# Patient Record
Sex: Female | Born: 1945 | Race: White | Hispanic: No | Smoking: Never smoker
Health system: Southern US, Community
[De-identification: ages and names within clinical notes are randomized; demographics above are authoritative.]

## PROBLEM LIST (undated history)

## (undated) DIAGNOSIS — E119 Type 2 diabetes mellitus without complications: Secondary | ICD-10-CM

## (undated) DIAGNOSIS — N393 Stress incontinence (female) (male): Secondary | ICD-10-CM

## (undated) DIAGNOSIS — N816 Rectocele: Secondary | ICD-10-CM

## (undated) DIAGNOSIS — IMO0002 Reserved for concepts with insufficient information to code with codable children: Secondary | ICD-10-CM

## (undated) DIAGNOSIS — M75100 Unspecified rotator cuff tear or rupture of unspecified shoulder, not specified as traumatic: Secondary | ICD-10-CM

## (undated) DIAGNOSIS — I10 Essential (primary) hypertension: Secondary | ICD-10-CM

## (undated) DIAGNOSIS — D649 Anemia, unspecified: Secondary | ICD-10-CM

## (undated) DIAGNOSIS — L900 Lichen sclerosus et atrophicus: Secondary | ICD-10-CM

## (undated) DIAGNOSIS — M8580 Other specified disorders of bone density and structure, unspecified site: Secondary | ICD-10-CM

## (undated) DIAGNOSIS — I452 Bifascicular block: Secondary | ICD-10-CM

## (undated) DIAGNOSIS — M1990 Unspecified osteoarthritis, unspecified site: Secondary | ICD-10-CM

## (undated) DIAGNOSIS — E039 Hypothyroidism, unspecified: Secondary | ICD-10-CM

## (undated) DIAGNOSIS — N185 Chronic kidney disease, stage 5: Secondary | ICD-10-CM

## (undated) DIAGNOSIS — N189 Chronic kidney disease, unspecified: Secondary | ICD-10-CM

## (undated) DIAGNOSIS — K222 Esophageal obstruction: Secondary | ICD-10-CM

## (undated) DIAGNOSIS — I872 Venous insufficiency (chronic) (peripheral): Secondary | ICD-10-CM

## (undated) HISTORY — DX: Chronic kidney disease, unspecified: N18.9

## (undated) HISTORY — DX: Other specified disorders of bone density and structure, unspecified site: M85.80

## (undated) HISTORY — DX: Stress incontinence (female) (male): N39.3

## (undated) HISTORY — DX: Type 2 diabetes mellitus without complications: E11.9

## (undated) HISTORY — DX: Anemia, unspecified: D64.9

## (undated) HISTORY — DX: Hyperlipidemia, unspecified: E78.5

## (undated) HISTORY — DX: Unspecified rotator cuff tear or rupture of unspecified shoulder, not specified as traumatic: M75.100

## (undated) HISTORY — DX: Reserved for concepts with insufficient information to code with codable children: IMO0002

## (undated) HISTORY — PX: RECTAL SURGERY: SHX760

## (undated) HISTORY — DX: Esophageal obstruction: K22.2

## (undated) HISTORY — DX: Essential (primary) hypertension: I10

## (undated) HISTORY — DX: Rectocele: N81.6

## (undated) HISTORY — PX: INCONTINENCE SURGERY: SHX676

## (undated) HISTORY — DX: Unspecified osteoarthritis, unspecified site: M19.90

## (undated) HISTORY — DX: Venous insufficiency (chronic) (peripheral): I87.2

## (undated) HISTORY — DX: Bifascicular block: I45.2

## (undated) HISTORY — DX: Hypothyroidism, unspecified: E03.9

---

## 1985-01-19 HISTORY — PX: CHOLECYSTECTOMY: SHX55

## 1998-03-22 ENCOUNTER — Encounter: Payer: Self-pay | Admitting: Internal Medicine

## 1998-03-22 ENCOUNTER — Ambulatory Visit (HOSPITAL_COMMUNITY): Admission: RE | Admit: 1998-03-22 | Discharge: 1998-03-22 | Payer: Self-pay | Admitting: Internal Medicine

## 1999-02-03 ENCOUNTER — Other Ambulatory Visit: Admission: RE | Admit: 1999-02-03 | Discharge: 1999-02-03 | Payer: Self-pay | Admitting: Obstetrics and Gynecology

## 1999-05-05 ENCOUNTER — Encounter (HOSPITAL_BASED_OUTPATIENT_CLINIC_OR_DEPARTMENT_OTHER): Payer: Self-pay | Admitting: Internal Medicine

## 1999-05-05 ENCOUNTER — Encounter: Admission: RE | Admit: 1999-05-05 | Discharge: 1999-05-05 | Payer: Self-pay | Admitting: Internal Medicine

## 1999-08-05 ENCOUNTER — Ambulatory Visit (HOSPITAL_COMMUNITY): Admission: RE | Admit: 1999-08-05 | Discharge: 1999-08-05 | Payer: Self-pay | Admitting: *Deleted

## 1999-08-05 ENCOUNTER — Encounter: Payer: Self-pay | Admitting: *Deleted

## 2000-06-14 ENCOUNTER — Encounter: Admission: RE | Admit: 2000-06-14 | Discharge: 2000-06-14 | Payer: Self-pay | Admitting: Obstetrics and Gynecology

## 2000-06-14 ENCOUNTER — Encounter: Payer: Self-pay | Admitting: Obstetrics and Gynecology

## 2000-06-15 ENCOUNTER — Other Ambulatory Visit: Admission: RE | Admit: 2000-06-15 | Discharge: 2000-06-15 | Payer: Self-pay | Admitting: Obstetrics and Gynecology

## 2001-06-15 ENCOUNTER — Encounter: Payer: Self-pay | Admitting: Obstetrics and Gynecology

## 2001-06-15 ENCOUNTER — Encounter: Admission: RE | Admit: 2001-06-15 | Discharge: 2001-06-15 | Payer: Self-pay | Admitting: Obstetrics and Gynecology

## 2001-06-23 ENCOUNTER — Other Ambulatory Visit: Admission: RE | Admit: 2001-06-23 | Discharge: 2001-06-23 | Payer: Self-pay | Admitting: Obstetrics and Gynecology

## 2001-07-26 ENCOUNTER — Emergency Department (HOSPITAL_COMMUNITY): Admission: EM | Admit: 2001-07-26 | Discharge: 2001-07-26 | Payer: Self-pay | Admitting: Emergency Medicine

## 2002-02-08 ENCOUNTER — Encounter: Payer: Self-pay | Admitting: Emergency Medicine

## 2002-02-08 ENCOUNTER — Emergency Department (HOSPITAL_COMMUNITY): Admission: EM | Admit: 2002-02-08 | Discharge: 2002-02-08 | Payer: Self-pay | Admitting: Emergency Medicine

## 2002-03-30 ENCOUNTER — Encounter: Admission: RE | Admit: 2002-03-30 | Discharge: 2002-03-30 | Payer: Self-pay | Admitting: Obstetrics and Gynecology

## 2002-03-30 ENCOUNTER — Encounter: Payer: Self-pay | Admitting: Obstetrics and Gynecology

## 2002-04-18 ENCOUNTER — Encounter: Payer: Self-pay | Admitting: Obstetrics and Gynecology

## 2002-04-18 ENCOUNTER — Encounter: Admission: RE | Admit: 2002-04-18 | Discharge: 2002-04-18 | Payer: Self-pay | Admitting: Obstetrics and Gynecology

## 2002-04-24 ENCOUNTER — Encounter: Payer: Self-pay | Admitting: Internal Medicine

## 2002-04-24 ENCOUNTER — Encounter: Admission: RE | Admit: 2002-04-24 | Discharge: 2002-04-24 | Payer: Self-pay | Admitting: Internal Medicine

## 2002-05-19 ENCOUNTER — Emergency Department (HOSPITAL_COMMUNITY): Admission: EM | Admit: 2002-05-19 | Discharge: 2002-05-19 | Payer: Self-pay | Admitting: Emergency Medicine

## 2002-06-27 ENCOUNTER — Other Ambulatory Visit: Admission: RE | Admit: 2002-06-27 | Discharge: 2002-06-27 | Payer: Self-pay | Admitting: Obstetrics and Gynecology

## 2002-11-18 ENCOUNTER — Emergency Department (HOSPITAL_COMMUNITY): Admission: EM | Admit: 2002-11-18 | Discharge: 2002-11-19 | Payer: Self-pay | Admitting: Emergency Medicine

## 2003-04-26 ENCOUNTER — Encounter: Admission: RE | Admit: 2003-04-26 | Discharge: 2003-04-26 | Payer: Self-pay | Admitting: Obstetrics and Gynecology

## 2003-10-08 ENCOUNTER — Other Ambulatory Visit: Admission: RE | Admit: 2003-10-08 | Discharge: 2003-10-08 | Payer: Self-pay | Admitting: Obstetrics and Gynecology

## 2004-03-27 ENCOUNTER — Encounter: Admission: RE | Admit: 2004-03-27 | Discharge: 2004-03-27 | Payer: Self-pay | Admitting: Gastroenterology

## 2004-04-21 ENCOUNTER — Ambulatory Visit (HOSPITAL_COMMUNITY): Admission: RE | Admit: 2004-04-21 | Discharge: 2004-04-21 | Payer: Self-pay | Admitting: Gastroenterology

## 2004-05-08 ENCOUNTER — Encounter: Admission: RE | Admit: 2004-05-08 | Discharge: 2004-05-08 | Payer: Self-pay | Admitting: Internal Medicine

## 2004-05-22 ENCOUNTER — Encounter: Admission: RE | Admit: 2004-05-22 | Discharge: 2004-05-22 | Payer: Self-pay | Admitting: Internal Medicine

## 2004-11-17 ENCOUNTER — Other Ambulatory Visit: Admission: RE | Admit: 2004-11-17 | Discharge: 2004-11-17 | Payer: Self-pay | Admitting: Obstetrics and Gynecology

## 2005-06-08 ENCOUNTER — Encounter: Admission: RE | Admit: 2005-06-08 | Discharge: 2005-06-08 | Payer: Self-pay | Admitting: Obstetrics and Gynecology

## 2006-06-10 ENCOUNTER — Encounter: Admission: RE | Admit: 2006-06-10 | Discharge: 2006-06-10 | Payer: Self-pay | Admitting: Obstetrics and Gynecology

## 2006-10-28 ENCOUNTER — Ambulatory Visit (HOSPITAL_BASED_OUTPATIENT_CLINIC_OR_DEPARTMENT_OTHER): Admission: RE | Admit: 2006-10-28 | Discharge: 2006-10-28 | Payer: Self-pay | Admitting: Urology

## 2007-01-18 ENCOUNTER — Ambulatory Visit (HOSPITAL_BASED_OUTPATIENT_CLINIC_OR_DEPARTMENT_OTHER): Admission: RE | Admit: 2007-01-18 | Discharge: 2007-01-18 | Payer: Self-pay | Admitting: Urology

## 2007-04-07 ENCOUNTER — Ambulatory Visit (HOSPITAL_COMMUNITY): Admission: RE | Admit: 2007-04-07 | Discharge: 2007-04-08 | Payer: Self-pay | Admitting: Urology

## 2007-06-14 ENCOUNTER — Encounter: Admission: RE | Admit: 2007-06-14 | Discharge: 2007-06-14 | Payer: Self-pay | Admitting: Obstetrics and Gynecology

## 2008-06-26 ENCOUNTER — Encounter: Admission: RE | Admit: 2008-06-26 | Discharge: 2008-06-26 | Payer: Self-pay | Admitting: Internal Medicine

## 2009-07-01 ENCOUNTER — Encounter: Admission: RE | Admit: 2009-07-01 | Discharge: 2009-07-01 | Payer: Self-pay | Admitting: Obstetrics and Gynecology

## 2009-09-19 ENCOUNTER — Encounter: Admission: RE | Admit: 2009-09-19 | Discharge: 2009-09-19 | Payer: Self-pay | Admitting: Internal Medicine

## 2010-05-05 ENCOUNTER — Emergency Department (HOSPITAL_COMMUNITY)
Admission: EM | Admit: 2010-05-05 | Discharge: 2010-05-05 | Disposition: A | Discharge status: Home / Self Care | Payer: Medicare Other | Attending: Emergency Medicine | Admitting: Emergency Medicine

## 2010-05-05 ENCOUNTER — Emergency Department (HOSPITAL_COMMUNITY): Payer: Medicare Other

## 2010-05-05 DIAGNOSIS — S335XXA Sprain of ligaments of lumbar spine, initial encounter: Secondary | ICD-10-CM | POA: Insufficient documentation

## 2010-05-05 DIAGNOSIS — M542 Cervicalgia: Secondary | ICD-10-CM | POA: Insufficient documentation

## 2010-05-05 DIAGNOSIS — E039 Hypothyroidism, unspecified: Secondary | ICD-10-CM | POA: Insufficient documentation

## 2010-05-05 DIAGNOSIS — R51 Headache: Secondary | ICD-10-CM | POA: Insufficient documentation

## 2010-05-05 DIAGNOSIS — I10 Essential (primary) hypertension: Secondary | ICD-10-CM | POA: Insufficient documentation

## 2010-05-05 DIAGNOSIS — F411 Generalized anxiety disorder: Secondary | ICD-10-CM | POA: Insufficient documentation

## 2010-05-05 DIAGNOSIS — E119 Type 2 diabetes mellitus without complications: Secondary | ICD-10-CM | POA: Insufficient documentation

## 2010-05-05 DIAGNOSIS — Y92410 Unspecified street and highway as the place of occurrence of the external cause: Secondary | ICD-10-CM | POA: Insufficient documentation

## 2010-06-03 NOTE — Op Note (Signed)
Rhonda Clayton, Rhonda Clayton               ACCOUNT NO.:  [id number]   MEDICAL RECORD NO.:  [id number]          PATIENT TYPE:  AMB   LOCATION:  [facility]                         FACILITY:  [facility]   PHYSICIAN:  Reece Packer, MD DATE OF BIRTH:  30-May-1945   DATE OF PROCEDURE:  10/28/2006  DATE OF DISCHARGE:                               OPERATIVE REPORT   PREOPERATIVE DIAGNOSIS:  Stress incontinence.   POSTOPERATIVE DIAGNOSIS:  Stress incontinence.   SURGERY:  Cystoscopy, transurethral collagen injection therapy.   [name] has stress urinary incontinence.  She has mildly  symptomatic prolapse.  We elected to treat with collagen.  The patient  was prepped and draped in the usual fashion.  The collagen injection  scope was utilized.  The bladder mucosa and trigone were normal.  Having  said that, there was mild hyperemia and I sent a urine for culture and I  will check her for a urinary tract infection.  I injected 4 1/3 syringes  transurethrally.  The initial injections, unfortunately, perforated near  the bladder neck.  She actually had a long urethra.  I was then able to  draw the scope back and inject at 2 o'clock with excellent coaptation.  In my opinion, this injection should not communicate with the  perforation at 7 o'clock.  The mucosa also appeared to be quite thin and  friable between 5 and 7 o'clock.  A red rubber catheter was used to  empty her bladder.  Overall, I was pleased with the procedure.  She will  be followed as per protocol.           ______________________________  Reece Packer, MD  Electronically Signed     SAM/MEDQ  D:  10/28/2006  T:  10/28/2006  Job:  [id number]

## 2010-06-03 NOTE — Op Note (Signed)
NAMEDOYLE, ASKELSON               ACCOUNT NO.:  [id number]   MEDICAL RECORD NO.:  [id number]          PATIENT TYPE:  AMB   LOCATION:  [facility]                         FACILITY:  [facility]   PHYSICIAN:  Reece Packer, MD DATE OF BIRTH:  10-Sep-1945   DATE OF PROCEDURE:  DATE OF DISCHARGE:                               OPERATIVE REPORT   PREOPERATIVE DIAGNOSIS:  Stress incontinence.   POSTOPERATIVE DIAGNOSIS:  Stress incontinence.   SURGERY:  Cystoscopy, transurethral collagen injection therapy.   SURGEON:  Scott A. MacDiarmid, M.D.   Mrs. Coye has a relatively asymptomatic cystocele and rectocele.  She  has some mild stress incontinence.  She was dry after her first  collagen, but it only lasted several weeks.  She was here for another  treatment.   The patient is prepped and draped in the usual fashion.  The ACMI scope  was used for the examination.  The bladder mucosa and trigone were  normal.  There was no stitch, foreign body or carcinoma.  I injected 2  and 1/3 syringes of collagen at 5 o'clock and there was excellent  coaptation.  I was very pleased with the procedure.  Hopefully this will  reach her treatment goal.  The bladder was emptied with a #12-French red  rubber catheter.           ______________________________  Reece Packer, MD  Electronically Signed     SAM/MEDQ  D:  01/18/2007  T:  01/18/2007  Job:  [id number]

## 2010-06-03 NOTE — Op Note (Signed)
NAMEMARVENE, Rhonda               ACCOUNT NO.:  [id number]   MEDICAL RECORD NO.:  [id number]          PATIENT TYPE:  OIB   LOCATION:  4                         FACILITY:  [facility]   PHYSICIAN:  Reece Packer, MD DATE OF BIRTH:  1945/05/29   DATE OF PROCEDURE:  DATE OF DISCHARGE:                               OPERATIVE REPORT   PREOPERATIVE DIAGNOSES:  1. Stress urinary incontinence.  2. Cystocele.  3. Rectocele.   POSTOPERATIVE DIAGNOSES:  1. Stress urinary incontinence.  2. Cystocele.  3. Rectocele.   PROCEDURE:  Sling cystourethropexy [facility]), cystocele repair, rectocele  repair, cystoscopy.   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   The patient was prepped and draped in the usual fashion.  Extra care was  taken when we positioned her legs to minimize the risk of compartment  syndrome, neuropathy and DVT.  Her preoperative laboratory tests were  normal.  She was given preoperative ciprofloxacin.   Under anesthesia, her cervix fortunately was well supported descending  to only 2 to 3 cm.  She had a lot of elasticity in her tissues and had a  diffuse grade 2 rectocele with splitting of the posterior fourchette.  Her cystocele was not that impressive and did not require a graft.   I used approximately 20 mL of the lidocaine epinephrine mixture  anteriorly and posteriorly for hemostasis and for submucosal dissection.  I made a T-shaped incision anteriorly and dissected the anterior vaginal  wall from the underlying pubocervical fascia to the white line  bilaterally.  I made a nice thick incision over the urethra for good  closure over the synthetic sling.   I did a two-layer anterior repair with running 2-0 Vicryl all the way  back to the cervix.  I then cystoscoped the patient and there was no  distortion of the trigone.  The ureteral orifices were quite close to  the bladder neck.  There was good efflux of indigo carmine  bilaterally.  There was no stitch in the bladder.   With the bladder emptied, I made two 1 cm incisions one fingerbreadth  above the symphysis pubis 1.5 cm later to the midline. I placed the  [place] needle on top of and along the back of the symphysis pubis  parallel to the midline on the pulp of my index finger at the  urethrovesical angle.  When I cystoscoped the patient, I could see the  bladder moving a little bit when I would deflect the left trocar so I  replaced it and repeated the procedure. Finally there was no indentation  of the bladder with trocar movement.  There is efflux bilaterally.  There is no injury to the urethra or bladder.   With the bladder empty, I attached the [facility] sling and brought it up  through the retropubic space.  I tensioned over the fat part of a mid  size Kelly clamp with my usual technique.  Not surprisingly, the sling  was little bit more proximal in the urethra because of the open  incision.  For this reason, I made  it a little bit looser than I  normally do.  I was very happy with tension of the sling with good  hypermobility and no spring back effect.   Copious irrigation was utilized.  I trimmed approximately a centimeter  of vaginal mucosa on the right and 1/2 cm on the left.  I closed the  mucosa with running 2-0 Vicryl on a CT-1 needle.   I then did the posterior dissection.  Two Allis clamps were placed on  the hymenal ring.  I removed a small triangle of perineal skin.  I made  a long vaginal posterior incision nearly to the apex.  I used a Ecologist anteriorly and posteriorly with blue hooks.  I was very happy  with my dissection of the thin posterior vaginal mucosa from the  rectovaginal fascia to the sidewall. I then did a rectal examination and  there was an apical defect that was quite large with no enterocele.  There was a slight defect distally.   I did a trap door maneuver closure at the apex using two interrupted 2-0   Vicryl sutures with my usual technique. I then did a two layer midline  running posterior repair taking care of the distal defect.   I repeated the regular examination and there was excellent flatting of  the rectal wall though there was still mobility due to its elasticity.  There was no injury or thinning of the rectum. There was no suture in  the rectum.   I trimmed minimally posteriorly. I closed the posterior vaginal wall  with running 2-0 Vicryl on a CT-1 needle. I did a perineal closure with  three interrupted #0 Vicryl sutures. She did have a lot of weakness in  this area. I then internalized the running suture and closed the  perineal skin with running subcuticular 2-0 Vicryl.   The vaginal length was good. There is no vaginal narrowing. There is  good reduction anteriorly and posteriorly of the prolapse. A vaginal  pack with Estrace cream was inserted.   I was very pleased with the surgery. Leg position was good. Total blood  loss was than 150 mL.   I am hoping this operation will reach [name]'s treatment goal.   She does have some issues with fecal incontinence and is depressed in  the perineum in order to try to evacuate her stool. Rarely she has done  a digital examination. 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           ______________________________  Reece Packer, MD  Electronically Signed     SAM/MEDQ  D:  04/07/2007  T:  04/07/2007  Job:  [id number]

## 2010-06-06 NOTE — Op Note (Signed)
NAMELILLAN, LUGINBUHL               ACCOUNT NO.:  [id number]   MEDICAL RECORD NO.:  [id number]          PATIENT TYPE:  AMB   LOCATION:  ENDO                         [facility]ACILITY:  [facility]   PHYSICIAN:  James L. Rolla Flatten., M.D.DATE OF BIRTH:  1945-08-30   DATE OF PROCEDURE:  04/21/2004  DATE OF DISCHARGE:                                 OPERATIVE REPORT   PROCEDURE:  Colonoscopy.   MEDICATIONS:  Fentanyl 62.5 mcg, Versed 6 mg IV.   INDICATIONS:  Fracture of colon polyps.   DESCRIPTION OF PROCEDURE:  The procedure has been explained to the patient  and consent obtained.  With the patient in the left lateral decubitus  position, the Olympus pediatric adjustable scope was inserted and advanced.  The prep was excellent.  We were able to reach the cecum using minimal  abdominal pressure and position change.  The ileocecal valve and appendiceal  orifice was seen.  The scope withdrawn in the cecum.  The ascending colon,  transverse colon, splenic flexure, descending and sigmoid colon were seen  well.  Moderate diverticulosis in the sigmoid colon.  No polyps were seen  throughout.  The scope was withdrawn down in the rectum, and the rectum was  free of polyps.  The scope was withdrawn, and the patient tolerated the  procedure well.   ASSESSMENT:  1.  Family history of polyps with negative colonoscopy at this time.  V19.0.  2.  Moderate diverticulosis.  562.10.   PLAN:  Will recommend diverticulosis information sheet and high-fiber diet.  Will recommend yearly Hemoccults and a repeat colonoscopy in five years due  to family history.  Will also see her back in the office in 2-3 months to  follow up on her dysphagia.      JLE/MEDQ  D:  04/21/2004  T:  04/21/2004  Job:  [id number]   cc:   Berneta Sages, M.D.  [address]  [place]  [place] [address]  Fax: [phone]

## 2010-06-10 ENCOUNTER — Other Ambulatory Visit: Payer: Self-pay | Admitting: Obstetrics and Gynecology

## 2010-06-10 DIAGNOSIS — Z1231 Encounter for screening mammogram for malignant neoplasm of breast: Secondary | ICD-10-CM

## 2010-07-09 ENCOUNTER — Ambulatory Visit: Payer: No Typology Code available for payment source

## 2010-07-21 ENCOUNTER — Ambulatory Visit
Admission: RE | Admit: 2010-07-21 | Discharge: 2010-07-21 | Disposition: A | Discharge status: Home / Self Care | Payer: No Typology Code available for payment source | Source: Ambulatory Visit | Attending: Obstetrics and Gynecology | Admitting: Obstetrics and Gynecology

## 2010-07-21 DIAGNOSIS — Z1231 Encounter for screening mammogram for malignant neoplasm of breast: Secondary | ICD-10-CM

## 2010-10-13 LAB — BASIC METABOLIC PANEL
Calcium: 9.6
Chloride: 97
GFR calc Af Amer: >60
GFR calc non Af Amer: >60
Glucose, Bld: 143 — ABNORMAL HIGH
Sodium: 131 — ABNORMAL LOW

## 2010-10-13 LAB — CBC
Hemoglobin: 10.9 — ABNORMAL LOW
MCHC: 34.9
MCHC: 34.7
Platelets: 412 — ABNORMAL HIGH
Platelets: 292
RBC: 4.46
RBC: 3.69 — ABNORMAL LOW
WBC: 4
WBC: 4.7

## 2010-10-13 LAB — HEMOGLOBIN A1C: Mean Plasma Glucose: 165

## 2010-10-13 LAB — URINALYSIS, ROUTINE W REFLEX MICROSCOPIC
Protein, ur: NEGATIVE
pH: 7

## 2010-10-13 LAB — TYPE AND SCREEN
ABO/RH(D): B POS
Antibody Screen: NEGATIVE

## 2010-10-13 LAB — PROTIME-INR
INR: 0.9
Prothrombin Time: 12.7

## 2010-10-13 LAB — ABO/RH: ABO/RH(D): B POS

## 2010-10-24 LAB — BASIC METABOLIC PANEL
BUN: 10
CO2: 30
Chloride: 93 — ABNORMAL LOW
Creatinine, Ser: 0.71
GFR calc Af Amer: >60
Glucose, Bld: 173 — ABNORMAL HIGH

## 2010-10-30 LAB — I-STAT 8, (EC8 V) (CONVERTED LAB)
BUN: 13
Chloride: 100
Glucose, Bld: 143 — ABNORMAL HIGH
Hemoglobin: 13.9
pCO2, Ven: 33.2 — ABNORMAL LOW
pH, Ven: 7.454 — ABNORMAL HIGH

## 2010-10-30 LAB — URINE CULTURE: Colony Count: NO GROWTH

## 2011-01-09 ENCOUNTER — Ambulatory Visit (INDEPENDENT_AMBULATORY_CARE_PROVIDER_SITE_OTHER): Payer: BC Managed Care – PPO

## 2011-01-09 DIAGNOSIS — M25529 Pain in unspecified elbow: Secondary | ICD-10-CM

## 2011-03-01 ENCOUNTER — Ambulatory Visit (INDEPENDENT_AMBULATORY_CARE_PROVIDER_SITE_OTHER): Payer: BC Managed Care – PPO | Admitting: Family Medicine

## 2011-03-01 VITALS — BP 135/74 | HR 65 | Temp 97.9°F | Resp 16 | Ht 62.5 in | Wt 176.0 lb

## 2011-03-01 DIAGNOSIS — N390 Urinary tract infection, site not specified: Secondary | ICD-10-CM

## 2011-03-01 DIAGNOSIS — E039 Hypothyroidism, unspecified: Secondary | ICD-10-CM | POA: Insufficient documentation

## 2011-03-01 DIAGNOSIS — R300 Dysuria: Secondary | ICD-10-CM

## 2011-03-01 DIAGNOSIS — E119 Type 2 diabetes mellitus without complications: Secondary | ICD-10-CM

## 2011-03-01 DIAGNOSIS — N952 Postmenopausal atrophic vaginitis: Secondary | ICD-10-CM

## 2011-03-01 DIAGNOSIS — I10 Essential (primary) hypertension: Secondary | ICD-10-CM

## 2011-03-01 LAB — POCT WET PREP WITH KOH: KOH Prep POC: NEGATIVE

## 2011-03-01 LAB — POCT URINALYSIS DIPSTICK
Bilirubin, UA: NEGATIVE
Nitrite, UA: NEGATIVE
pH, UA: 5

## 2011-03-01 LAB — POCT UA - MICROSCOPIC ONLY: Yeast, UA: NEGATIVE

## 2011-03-01 MED ORDER — FLUCONAZOLE 150 MG PO TABS: ORAL_TABLET | ORAL | Status: AC

## 2011-03-01 MED ORDER — CEPHALEXIN 500 MG PO CAPS: 500.0000 mg | ORAL_CAPSULE | Freq: Three times a day (TID) | ORAL | Status: AC

## 2011-03-01 NOTE — Progress Notes (Signed)
  Subjective:    [name], female    DOB: 1945-10-10, 66 y.o.   MRN: [id number]  Urinary Tract Infection  This is a new problem. The current episode started yesterday. The problem occurs every urination. The problem has been unchanged. The pain is at a severity of 0/10. The patient is experiencing no pain. There has been no fever. She is sexually active. There is no history of pyelonephritis. Associated symptoms include frequency. Pertinent negatives include no chills, discharge, flank pain, hematuria, nausea or vomiting. She has tried nothing for the symptoms.   Recent BV treated with metrogel by GYN last week. C/o strong odor to urine  Review of Systems  Constitutional: Negative for chills.  Gastrointestinal: Negative for nausea and vomiting.  Genitourinary: Positive for frequency. Negative for hematuria and flank pain.       Objective:   Physical Exam   Results for orders placed in visit on 03/01/11  POCT UA - MICROSCOPIC ONLY      Component Value Range   WBC, Ur, HPF, POC 2-8     RBC, urine, microscopic 0-1     Bacteria, U Microscopic neg     Mucus, UA neg     Epithelial cells, urine per micros 0-1     Crystals, Ur, HPF, POC neg     Casts, Ur, LPF, POC neg     Yeast, UA neg    POCT URINALYSIS DIPSTICK      Component Value Range   Color, UA yellow     Clarity, UA clear     Glucose, UA neg     Bilirubin, UA neg     Ketones, UA neg     Spec Grav, UA <=1.005     Blood, UA trace-intact     pH, UA 5.0     Protein, UA neg     Urobilinogen, UA 0.2     Nitrite, UA neg     Leukocytes, UA Trace          Assessment & Plan:   1. UTI (urinary tract infection), recurrent POCT UA - Microscopic Only, POCT urinalysis dipstick, POCT Wet Prep with KOH, Urine culture  2. DM (diabetes mellitus)    3. HTN (hypertension)    4. Hypothyroid    5. Atrophic vaginitis      Keflex 500 mg TID Diflucan UAD Follow up with Dr. Wendy Poet

## 2011-03-03 LAB — URINE CULTURE: Colony Count: 60000

## 2011-03-30 ENCOUNTER — Ambulatory Visit (INDEPENDENT_AMBULATORY_CARE_PROVIDER_SITE_OTHER): Payer: BC Managed Care – PPO | Admitting: Internal Medicine

## 2011-03-30 VITALS — BP 142/74 | HR 80 | Temp 97.7°F | Resp 18 | Ht 62.5 in | Wt 177.0 lb

## 2011-03-30 DIAGNOSIS — J019 Acute sinusitis, unspecified: Secondary | ICD-10-CM

## 2011-03-30 MED ORDER — AMOXICILLIN 500 MG PO CAPS: 1000.0000 mg | ORAL_CAPSULE | Freq: Two times a day (BID) | ORAL | Status: AC

## 2011-03-30 NOTE — Progress Notes (Signed)
  Subjective:    [name], female    DOB: Nov 07, 1945, 66 y.o.   MRN: [id number]  HPI One-week history of progressively worsening congestion in the nose with yellow and bloody discharge Occasional nonproductive cough but able to sleep No fever   Review of SystemsHealth problems stable/Dr.Avva primary care     Objective:   Physical Exam TMs clear Nares purulent Bilateral maxillary sinus tenderness Throat clear Lungs clear      Assessment & Plan:  Sinusitis-acute  Amoxicillin 500x2 bid 10d otc meds

## 2011-07-16 ENCOUNTER — Other Ambulatory Visit: Payer: Self-pay | Admitting: Internal Medicine

## 2011-07-16 DIAGNOSIS — Z1231 Encounter for screening mammogram for malignant neoplasm of breast: Secondary | ICD-10-CM

## 2011-08-03 ENCOUNTER — Ambulatory Visit
Admission: RE | Admit: 2011-08-03 | Discharge: 2011-08-03 | Disposition: A | Discharge status: Home / Self Care | Payer: BC Managed Care – PPO | Source: Ambulatory Visit | Attending: Internal Medicine | Admitting: Internal Medicine

## 2011-08-03 DIAGNOSIS — Z1231 Encounter for screening mammogram for malignant neoplasm of breast: Secondary | ICD-10-CM

## 2012-08-18 ENCOUNTER — Other Ambulatory Visit: Payer: Self-pay

## 2012-08-18 DIAGNOSIS — Z1231 Encounter for screening mammogram for malignant neoplasm of breast: Secondary | ICD-10-CM

## 2012-09-06 ENCOUNTER — Ambulatory Visit: Payer: BC Managed Care – PPO

## 2012-09-06 ENCOUNTER — Ambulatory Visit
Admission: RE | Admit: 2012-09-06 | Discharge: 2012-09-06 | Disposition: A | Discharge status: Home / Self Care | Payer: Medicare Other | Source: Ambulatory Visit

## 2012-09-06 DIAGNOSIS — Z1231 Encounter for screening mammogram for malignant neoplasm of breast: Secondary | ICD-10-CM

## 2013-02-17 ENCOUNTER — Encounter: Payer: Self-pay | Admitting: Internal Medicine

## 2013-02-24 ENCOUNTER — Encounter: Payer: Self-pay | Admitting: *Deleted

## 2013-03-10 ENCOUNTER — Encounter: Payer: Self-pay | Admitting: *Deleted

## 2013-04-11 ENCOUNTER — Ambulatory Visit (INDEPENDENT_AMBULATORY_CARE_PROVIDER_SITE_OTHER): Payer: Medicare HMO | Admitting: Internal Medicine

## 2013-04-11 ENCOUNTER — Encounter: Payer: Self-pay | Admitting: Internal Medicine

## 2013-04-11 VITALS — BP 100/66 | HR 64 | Ht 61.0 in | Wt 174.2 lb

## 2013-04-11 DIAGNOSIS — R933 Abnormal findings on diagnostic imaging of other parts of digestive tract: Secondary | ICD-10-CM

## 2013-04-11 DIAGNOSIS — R1319 Other dysphagia: Secondary | ICD-10-CM

## 2013-04-11 NOTE — Addendum Note (Signed)
Addended by: Larina Bras on: 04/11/2013 02:12 PM   Modules accepted: Orders

## 2013-04-11 NOTE — Patient Instructions (Signed)
You have been scheduled for an endoscopy with propofol. Please follow written instructions given to you at your visit today. If you use inhalers (even only as needed), please bring them with you on the day of your procedure. Your physician has requested that you go to www.startemmi.com and enter the access code given to you at your visit today. This web site gives a general overview about your procedure. However, you should still follow specific instructions given to you by our office regarding your preparation for the procedure.  CC:Dr Avva

## 2013-04-11 NOTE — Progress Notes (Signed)
Rhonda Clayton 02-21-1945 [id number]  Note: This dictation was prepared with Dragon digital system. Any transcriptional errors that result from this procedure are unintentional.   History of Present Illness: This is a 68 year old white female with solid food dysphagia and pill dysphagia. She describes food sticking in her esophagus. She denies heartburn or odynophagia. Barium swallow in September 2011 showed delayed passage of the 13 mm tablet through distal esophagus. There was a stricture or underdistention of the distal esophagus. She also had mild esophageal dysmotility. She has not been on  acid suppressing agents. She woke up several weeks ago with  choking sensation and large amount of mucus in the back of her throat. She could not breathe for aminute and finally  Called her daughter. She was able to cough up .mucus and saliva. It was a very scary experience which prompted her to see Dr.Avva.    Past Medical History  Diagnosis Date  . Esophageal stricture   . Hypertension   . Hyperlipidemia   . BBB (bundle branch block)   . Hypothyroidism   . Osteopenia   . DJD (degenerative joint disease)   . Venous insufficiency   . Cystocele   . Rectocele   . Stress incontinence   . Torn rotator cuff     bilateral  . DM (diabetes mellitus)     Past Surgical History  Procedure Laterality Date  . Cholecystectomy  1987    Allergies  Allergen Reactions  . Cephalexin Other (See Comments)    Yeast infection  . Ciprofloxacin Other (See Comments)    tendinitis  . Keflex [Cephalexin]     As long as taking Diflucan with it  . Sulfa Antibiotics Other (See Comments)    Yeast infection    Family history and social history have been reviewed.  Review of Systems:   [name] is negative except as outlined in the H&P  Physical Exam: General Appearance Well developed, in no distress, normal bolus. No cough or status  Eyes  Non icteric  HEENT  Non traumatic,  normocephalic  Mouth No lesion, tongue papillated, no cheilosis Neck Supple without adenopathy, thyroid not enlarged, no carotid bruits, no JVD Lungs Clear to auscultation bilaterally COR Normal S1, normal S2, regular rhythm, no murmur, quiet precordium Abdomen  obese soft abdomen without tenderness. Post cholecystectomy scar in right upper quadrant.  Rectal  soft Hemoccult negative stool  Extremities  No pedal edema Skin No lesions Neurological Alert and oriented x 3 Psychological Normal mood and affect  Assessment and Plan:   68 year old white female with intermittent solid food dysphagia. Barium swallow in 2011 confirmed delay in the passage of 13 mm tablet. It was not clear whether this was a stricture or spasm or underdistention. She also has a mild esophageal dysmotility likely due to diabetes or presbyesophagus. We will schedule upper endoscopy and esophageal dilation   Colorectal screening. She says that she had a colonoscopy in February 2012 by Dr.Edwards , we will request those records she is up-to-date on colonoscopy.     Rhonda Clayton 04/11/2013

## 2013-04-20 ENCOUNTER — Encounter: Payer: Medicare HMO | Admitting: Internal Medicine

## 2013-06-01 ENCOUNTER — Encounter: Payer: Medicare HMO | Admitting: Internal Medicine

## 2013-09-01 ENCOUNTER — Other Ambulatory Visit: Payer: Self-pay

## 2013-09-01 DIAGNOSIS — Z1231 Encounter for screening mammogram for malignant neoplasm of breast: Secondary | ICD-10-CM

## 2013-09-07 ENCOUNTER — Ambulatory Visit
Admission: RE | Admit: 2013-09-07 | Discharge: 2013-09-07 | Disposition: A | Discharge status: Home / Self Care | Payer: Commercial Managed Care - HMO | Source: Ambulatory Visit

## 2013-09-07 ENCOUNTER — Encounter (INDEPENDENT_AMBULATORY_CARE_PROVIDER_SITE_OTHER): Payer: Self-pay

## 2013-09-07 DIAGNOSIS — Z1231 Encounter for screening mammogram for malignant neoplasm of breast: Secondary | ICD-10-CM

## 2014-02-07 ENCOUNTER — Ambulatory Visit
Admission: RE | Admit: 2014-02-07 | Discharge: 2014-02-07 | Disposition: A | Discharge status: Home / Self Care | Payer: PPO | Source: Ambulatory Visit | Attending: Internal Medicine | Admitting: Internal Medicine

## 2014-02-07 ENCOUNTER — Other Ambulatory Visit: Payer: Self-pay | Admitting: Internal Medicine

## 2014-02-07 DIAGNOSIS — M5489 Other dorsalgia: Secondary | ICD-10-CM

## 2014-03-08 ENCOUNTER — Other Ambulatory Visit: Payer: Self-pay | Admitting: Neurosurgery

## 2014-03-08 DIAGNOSIS — M419 Scoliosis, unspecified: Secondary | ICD-10-CM

## 2014-03-12 ENCOUNTER — Other Ambulatory Visit: Payer: Self-pay | Admitting: Neurosurgery

## 2014-03-12 DIAGNOSIS — M419 Scoliosis, unspecified: Secondary | ICD-10-CM

## 2014-03-13 ENCOUNTER — Ambulatory Visit
Admission: RE | Admit: 2014-03-13 | Discharge: 2014-03-13 | Disposition: A | Discharge status: Home / Self Care | Payer: PPO | Source: Ambulatory Visit | Attending: Neurosurgery | Admitting: Neurosurgery

## 2014-03-13 DIAGNOSIS — M419 Scoliosis, unspecified: Secondary | ICD-10-CM

## 2014-03-21 ENCOUNTER — Telehealth: Payer: Self-pay | Admitting: Vascular Surgery

## 2014-03-21 ENCOUNTER — Other Ambulatory Visit: Payer: Self-pay | Admitting: Neurosurgery

## 2014-03-21 NOTE — Telephone Encounter (Signed)
Spoke with patient to schedule. LS spine images are in EPIC, dpm

## 2014-03-21 NOTE — Telephone Encounter (Signed)
-----   Message from Sherrye Payor, RN sent at 03/21/2014  9:08 AM EST ----- Regarding: needs consult with Dr. Scot Dock Please schedule for new pt. Consult with Dr. Scot Dock prior to ALIF scheduled for 05/03/14. ([facility] is assisting Dr. Vertell Limber)  Please remind pt. to bring copy of L-S spine films with her to appt.  Thanks.

## 2014-03-22 ENCOUNTER — Other Ambulatory Visit: Payer: BC Managed Care – PPO

## 2014-03-22 ENCOUNTER — Other Ambulatory Visit: Payer: Self-pay

## 2014-03-26 ENCOUNTER — Other Ambulatory Visit: Payer: Self-pay | Admitting: Neurosurgery

## 2014-03-26 DIAGNOSIS — M419 Scoliosis, unspecified: Secondary | ICD-10-CM

## 2014-03-29 ENCOUNTER — Encounter: Payer: Self-pay | Admitting: Vascular Surgery

## 2014-03-30 ENCOUNTER — Encounter: Payer: Self-pay | Admitting: Vascular Surgery

## 2014-03-30 ENCOUNTER — Ambulatory Visit (INDEPENDENT_AMBULATORY_CARE_PROVIDER_SITE_OTHER): Payer: PPO | Admitting: Vascular Surgery

## 2014-03-30 VITALS — BP 138/69 | HR 72 | Ht 61.0 in | Wt 170.3 lb

## 2014-03-30 DIAGNOSIS — M5136 Other intervertebral disc degeneration, lumbar region: Secondary | ICD-10-CM | POA: Diagnosis not present

## 2014-03-30 NOTE — Progress Notes (Signed)
[facility] [place]  Patient name: Rhonda Clayton MRN: [id number] DOB: [birth year] Sex: female  REASON FOR CONSULT: Preoperative evaluation for ALIF.  HPI: Rhonda Clayton is a 69 y.o. female with a long history of scoliosis and degenerative disc disease. Dr. Vertell Limber has extensive surgery planned and I've been asked to assist in stage I of the procedure, which is L5-S1 anterior lumbar interbody fusion. She has had a long history of back pain and feels that this is no longer tolerable and that she would like to pursue intervention.  She is failed conservative treatment and has progressive back pain.  Of note she has a history of obesity and has lost significant weight.   I do not get any history of claudication or rest pain in her lower extremities.  Past Medical History  Diagnosis Date  . Esophageal stricture   . Hypertension   . Hyperlipidemia   . BBB (bundle branch block)   . Hypothyroidism   . Osteopenia   . DJD (degenerative joint disease)   . Venous insufficiency   . Cystocele   . Rectocele   . Stress incontinence   . Torn rotator cuff     bilateral  . DM (diabetes mellitus)   . Anemia    Family History  Problem Relation Age of Onset  . Multiple myeloma Mother   . Cancer Maternal Aunt     cancer in the mouth-dipped snuff  . Heart disease Brother   . Diabetes Father   . Colon polyps Brother   . Heart disease Maternal Uncle   . Heart disease Paternal Uncle   . Heart disease Paternal Grandfather    SOCIAL HISTORY: History  Substance Use Topics  . Smoking status: Never Smoker   . Smokeless tobacco: Never Used  . Alcohol Use: No   Allergies  Allergen Reactions  . Cephalexin Other (See Comments)    Yeast infection  . Ciprofloxacin Other (See Comments)    tendinitis  . Keflex [Cephalexin]     As long as taking Diflucan with it  . Sulfa Antibiotics Other (See Comments)    Yeast infection   Current Outpatient Prescriptions  Medication  Sig Dispense Refill  . estradiol (VAGIFEM) 25 MCG vaginal tablet Place 25 mcg vaginally daily.    Marland Kitchen glyBURIDE-metformin (GLUCOVANCE) 5-500 MG per tablet Take 2 tablets by mouth 2 (two) times daily.    . hydrochlorothiazide (MICROZIDE) 12.5 MG capsule Take 12.5 mg by mouth daily.    Marland Kitchen levothyroxine (SYNTHROID, LEVOTHROID) 112 MCG tablet Take 112 mcg by mouth daily.    . ramipril (ALTACE) 10 MG tablet Take 10 mg by mouth daily.     No current facility-administered medications for this visit.   REVIEW OF SYSTEMS: Valu.Clayton ] denotes positive finding; [  ] denotes negative finding  CARDIOVASCULAR:  [ ]  chest pain   [ ]  chest pressure   [ ]  palpitations   [ ]  orthopnea   [ ]  dyspnea on exertion   [ ]  claudication   [ ]  rest pain   [ ]  DVT   [ ]  phlebitis PULMONARY:   [ ]  productive cough   [ ]  asthma   [ ]  wheezing NEUROLOGIC:   Valu.Clayton ] weakness  [ ]  paresthesias  [ ]  aphasia  [ ]  amaurosis  [ ]  dizziness HEMATOLOGIC:   [ ]  bleeding problems   [ ]  clotting disorders MUSCULOSKELETAL:  [ ]  joint pain   [ ]  joint swelling [ ]   leg swelling GASTROINTESTINAL: [ ]   blood in stool  [ ]   hematemesis GENITOURINARY:  [ ]   dysuria  [ ]   hematuria PSYCHIATRIC:  [ ]  history of major depression INTEGUMENTARY:  [ ]  rashes  [ ]  ulcers CONSTITUTIONAL:  [ ]  fever   [ ]  chills  PHYSICAL EXAM: Filed Vitals:   03/30/14 1440  BP: 138/69  Pulse: 72  Height: 5' 1"  (1.549 m)  Weight: 170 lb 4.8 oz (77.248 kg)  SpO2: 100%   Body mass index is 32.19 kg/(m^2). GENERAL: The patient is a well-nourished female, in no acute distress. The vital signs are documented above. CARDIOVASCULAR: There is a regular rate and rhythm. I do not detect carotid bruits. She has palpable femoral pulses. She has a diminished but palpable right dorsalis pedis pulse and a palpable left posterior tibial pulse. She has no significant lower extremity swelling. PULMONARY: There is good air exchange bilaterally without wheezing or rales. ABDOMEN: She is  obese. Exposure will be more challenging given her body habitus. MUSCULOSKELETAL: There are no major deformities or cyanosis. NEUROLOGIC: No focal weakness or paresthesias are detected. SKIN: There are no ulcers or rashes noted. PSYCHIATRIC: The patient has a normal affect.  DATA:  I reviewed her MRI which she brought on a disc which shows severe degenerative disc disease at L5-S1 with a bulging disc at this level.   I reviewed her plain x-rays which do show some calcific disease in her aorta and minimal calcific disease in the iliac arteries.  MEDICAL ISSUES:  SCOLIOSIS AND DEGENERATIVE DISC DISEASE: She does appear to be a reasonable candidate for anterior retroperitoneal exposure of the L5-S1 disc space although given her body habitus this may be challenging. I have reviewed our role in exposure of the spine in order to allow anterior lumbar interbody fusion at the appropriate levels. We have discussed the potential complications of surgery, including but not limited to, arterial or venous injury, thrombosis, or bleeding. We have also discussed the potential risks of wound healing problems, the development of a hernia, nerve injury, leg swelling, or other unpredictable medical problems.  All the patient's questions were answered and they are agreeable to proceed. Her surgery is scheduled for 05/03/2014.   [facility] [place] Beeper: [phone]

## 2014-04-11 ENCOUNTER — Other Ambulatory Visit: Payer: Self-pay | Admitting: Neurosurgery

## 2014-04-19 ENCOUNTER — Encounter (HOSPITAL_COMMUNITY): Payer: Self-pay | Admitting: Pharmacy Technician

## 2014-04-24 ENCOUNTER — Encounter (HOSPITAL_COMMUNITY): Payer: Self-pay

## 2014-04-24 ENCOUNTER — Encounter (HOSPITAL_COMMUNITY)
Admission: RE | Admit: 2014-04-24 | Discharge: 2014-04-24 | Disposition: A | Discharge status: Home / Self Care | Payer: PPO | Source: Ambulatory Visit | Attending: Neurosurgery | Admitting: Neurosurgery

## 2014-04-24 DIAGNOSIS — Z01810 Encounter for preprocedural cardiovascular examination: Secondary | ICD-10-CM | POA: Diagnosis not present

## 2014-04-24 DIAGNOSIS — Z01812 Encounter for preprocedural laboratory examination: Secondary | ICD-10-CM | POA: Insufficient documentation

## 2014-04-24 HISTORY — DX: Lichen sclerosus et atrophicus: L90.0

## 2014-04-24 LAB — CBC
HCT: 32.7 % — ABNORMAL LOW (ref 36.0–46.0)
Hemoglobin: 11.1 g/dL — ABNORMAL LOW (ref 12.0–15.0)
MCH: 28 pg (ref 26.0–34.0)
MCHC: 33.9 g/dL (ref 30.0–36.0)
MCV: 82.6 fL (ref 78.0–100.0)
PLATELETS: 368 10*3/uL (ref 150–400)
RBC: 3.96 MIL/uL (ref 3.87–5.11)
RDW: 13.9 % (ref 11.5–15.5)
WBC: 5.9 10*3/uL (ref 4.0–10.5)

## 2014-04-24 LAB — BASIC METABOLIC PANEL
ANION GAP: 8 (ref 5–15)
BUN: 24 mg/dL — AB (ref 6–23)
CHLORIDE: 100 mmol/L (ref 96–112)
CO2: 28 mmol/L (ref 19–32)
Calcium: 9.4 mg/dL (ref 8.4–10.5)
Creatinine, Ser: 1.14 mg/dL — ABNORMAL HIGH (ref 0.50–1.10)
GFR calc Af Amer: 55 mL/min — ABNORMAL LOW (ref >90)
GFR, EST NON AFRICAN AMERICAN: 48 mL/min — AB (ref >90)
Glucose, Bld: 183 mg/dL — ABNORMAL HIGH (ref 70–99)
POTASSIUM: 4.3 mmol/L (ref 3.5–5.1)
SODIUM: 136 mmol/L (ref 135–145)

## 2014-04-24 LAB — SURGICAL PCR SCREEN
MRSA, PCR: NEGATIVE
Staphylococcus aureus: NEGATIVE

## 2014-04-24 LAB — ABO/RH: ABO/RH(D): B POS

## 2014-04-24 NOTE — Pre-Procedure Instructions (Signed)
Rhonda Clayton  04/24/2014   Your procedure is scheduled on:   Thursday  05/03/14  Report to [facility] Admitting at 530 AM.  Call this number if you have problems the morning of surgery: [phone]   Remember:   Do not eat food or drink liquids after midnight.   Take these medicines the morning of surgery with A SIP OF WATER:   AMLODIPINE (NORVASC), GABAPENTIN, HYDROMORPHONE IF NEEDED, LEVOTHYROXINE (SYNTHROID)     STOP IBUPROFEN / ADVIL NOW !!!   Do not wear jewelry, make-up or nail polish.  Do not wear lotions, powders, or perfumes. You may wear deodorant.  Do not shave 48 hours prior to surgery. Men may shave face and neck.  Do not bring valuables to the hospital.  [facility] is not responsible                  for any belongings or valuables.               Contacts, dentures or bridgework may not be worn into surgery.  Leave suitcase in the car. After surgery it may be brought to your room.  For patients admitted to the hospital, discharge time is determined by your                treatment team.               Patients discharged the day of surgery will not be allowed to drive  home.  Name and phone number of your driver:   Special Instructions: [place] - Preparing for Surgery  Before surgery, you can play an important role.  Because skin is not sterile, your skin needs to be as free of germs as possible.  You can reduce the number of germs on you skin by washing with CHG (chlorahexidine gluconate) soap before surgery.  CHG is an antiseptic cleaner which kills germs and bonds with the skin to continue killing germs even after washing.  Please DO NOT use if you have an allergy to CHG or antibacterial soaps.  If your skin becomes reddened/irritated stop using the CHG and inform your nurse when you arrive at Short Stay.  Do not shave (including legs and underarms) for at least 48 hours prior to the first CHG shower.  You may shave your face.  Please follow these  instructions carefully:   1.  Shower with CHG Soap the night before surgery and the                                morning of Surgery.  2.  If you choose to wash your hair, wash your hair first as usual with your       normal shampoo.  3.  After you shampoo, rinse your hair and body thoroughly to remove the                      Shampoo.  4.  Use CHG as you would any other liquid soap.  You can apply chg directly       to the skin and wash gently with scrungie or a clean washcloth.  5.  Apply the CHG Soap to your body ONLY FROM THE NECK DOWN.        Do not use on open wounds or open sores.  Avoid contact with your eyes,  ears, mouth and genitals (private parts).  Wash genitals (private parts)       with your normal soap.  6.  Wash thoroughly, paying special attention to the area where your surgery        will be performed.  7.  Thoroughly rinse your body with warm water from the neck down.  8.  DO NOT shower/wash with your normal soap after using and rinsing off       the CHG Soap.  9.  Pat yourself dry with a clean towel.            10.  Wear clean pajamas.            11.  Place clean sheets on your bed the night of your first shower and do not        sleep with pets.  Day of Surgery  Do not apply any lotions/deoderants the morning of surgery.  Please wear clean clothes to the hospital/surgery center.     Please read over the following fact sheets that you were given: Pain Booklet, Coughing and Deep Breathing, Blood Transfusion Information, MRSA Information and Surgical Site Infection Prevention

## 2014-04-24 NOTE — Progress Notes (Signed)
NOTIFIED ALLISON OF EKG SHOWING BIFASCICULAR BLOCK.  WILL REQUEST OLD EKG, STRESS TEST FROM [facility].

## 2014-04-25 LAB — TYPE AND SCREEN
ABO/RH(D): B POS
Antibody Screen: NEGATIVE

## 2014-04-26 NOTE — Progress Notes (Addendum)
Anesthesia Chart Review:  Pt is 69 year old female scheduled for L4-5, L5-S1 ALIF on 05/03/2014 with Dr. Vertell Limber and Dr. Scot Dock.   PCP is Dr. Dagmar Hait at [facility].   PMH includes: HTN, DM, hyperlipidemia, hypothyroidism, venous insufficiency, anemia. Never smoker. BMI 32.   Preoperative labs reviewed.  Glucose 183.   EKG: NSR. RBBB. LAFB. Minimal voltage criteria for LVH, may be normal variant. Septal infarct , age undetermined. No significant change since last tracing 10/28/06 per Dr. Evette Georges interpretation.   Discussed with Dr. Deatra Canter. Pt will need cardiac clearance prior to surgery.  Willeen Cass, FNP-BC [facility] Short Stay Surgical Center/Anesthesiology Phone: [phone] 04/26/2014 5:42 PM  Addendum: I notified Susan at Dr. Melven Sartorius office of need for preoperative cardiology evaluation due to abnormal, but stable, EKG with multiple CAD risk factors, and need for two lumbar fusion surgeries. She will arrange and follow-up with patient.  George Hugh [facility] Short Stay Center/Anesthesiology Phone [phone] 04/27/2014 9:39 AM  Addendum: Patient was seen by cardiologist Dr. Rozann Lesches on 04/30/14 for a preoperative cardiac evaluation.  Nuclear stress test was done on 05/02/14 and showed:  Overall Impression: Normal stress nuclear study. LV Ejection Fraction: 69%. LV Wall Motion: NL LV Function; NL Wall Motion.  Dr. Domenic Polite cleared patient for her upcoming surgeries.  Surgery date has now been changed to 05/09/14 with stage 2 surgery scheduled for 05/14/14. I spoke with Janett Billow at Dr. Melven Sartorius office regarding patient's labs being 28 days old by her surgery date.  I was told it was okay for patient to get updated labs on arrival the day of her surgery--she is not a first case. Her 04/24/14 labs were okay, but showed mild anemia.  She is a diabetic and is on diuretic therapy, so she will need at least some labs repeated prior to surgery, including her T&S.  I  think an ISTAT8 and T&S should be sufficient.     George Hugh [facility] Short Stay Center/Anesthesiology Phone [phone] 05/03/2014 10:19 AM

## 2014-04-27 ENCOUNTER — Encounter: Payer: Self-pay | Admitting: Cardiology

## 2014-04-30 ENCOUNTER — Encounter: Payer: Self-pay | Admitting: Cardiology

## 2014-04-30 ENCOUNTER — Ambulatory Visit (INDEPENDENT_AMBULATORY_CARE_PROVIDER_SITE_OTHER): Payer: PPO | Admitting: Cardiology

## 2014-04-30 VITALS — BP 122/64 | HR 77 | Ht 61.0 in | Wt 169.0 lb

## 2014-04-30 DIAGNOSIS — R9431 Abnormal electrocardiogram [ECG] [EKG]: Secondary | ICD-10-CM | POA: Diagnosis not present

## 2014-04-30 DIAGNOSIS — I10 Essential (primary) hypertension: Secondary | ICD-10-CM

## 2014-04-30 DIAGNOSIS — Z01818 Encounter for other preprocedural examination: Secondary | ICD-10-CM

## 2014-04-30 DIAGNOSIS — E119 Type 2 diabetes mellitus without complications: Secondary | ICD-10-CM | POA: Diagnosis not present

## 2014-04-30 DIAGNOSIS — E782 Mixed hyperlipidemia: Secondary | ICD-10-CM

## 2014-04-30 NOTE — Progress Notes (Signed)
Cardiology Office Note  Date: 04/30/2014   ID: Rhonda Clayton, DOB 1945/05/29, MRN [id number]  PCP: Tivis Ringer, MD  Primary Cardiologist: Rozann Lesches, MD   Chief Complaint  Patient presents with  . Preoperative evaluation  . Abnormal ECG    History of Present Illness: Rhonda Clayton is a 69 y.o. female referred for cardiology consultation by Dr. Vertell Limber. She is being considered for anterior lumbar fusion followed by anterior lateral fusion, scheduled within the next one to 2 weeks. She had assessment by anesthesia on April 7 with review of abnormal ECG at that time - cardiology evaluation was suggested and this visit was subsequently scheduled.  She is here today with a family member. From a functional perspective, she reports leg pain and fatigue when she ambulates, no active back pain at this time on current medical regimen. She states that she was much more limited due to pain earlier in the year, but is able to do some of her basic ADLs now. She reports a history of palpitations over the years although no definite arrhythmia. Also no history of ischemic heart disease, states that she underwent stress testing greater than 5 years ago and does not recall being told of any specific abnormalities.  She does not report any exertional chest pain and describes NYHA class II dyspnea. Cardiac risk factors include hypertension, hyperlipidemia, and type 2 diabetes mellitus. Recent ECG is reviewed below. I reviewed her medications, she reports compliance.   Past Medical History  Diagnosis Date  . Esophageal stricture   . Essential hypertension   . Hyperlipidemia   . Hypothyroidism   . Osteopenia   . DJD (degenerative joint disease)   . Venous insufficiency   . Cystocele   . Rectocele   . Stress incontinence   . Torn rotator cuff     Bilateral  . Type 2 diabetes mellitus   . Anemia   . Lichen sclerosus   . RBBB with left anterior fascicular block     Past Surgical  History  Procedure Laterality Date  . Cholecystectomy  1987  . Incontinence surgery    . Rectal surgery      Current Outpatient Prescriptions  Medication Sig Dispense Refill  . amLODipine (NORVASC) 5 MG tablet Take 5 mg by mouth daily.    . Cholecalciferol (VITAMIN D PO) Take 1 capsule by mouth 2 (two) times daily.    . clobetasol ointment (TEMOVATE) 8.08 % Apply 1 application topically at bedtime.    . gabapentin (NEURONTIN) 100 MG capsule Take 100 mg by mouth at bedtime.    Marland Kitchen glyBURIDE-metformin (GLUCOVANCE) 5-500 MG per tablet Take 2 tablets by mouth 2 (two) times daily.    . hydrochlorothiazide (MICROZIDE) 12.5 MG capsule Take 12.5 mg by mouth daily.    Marland Kitchen HYDROmorphone (DILAUDID) 2 MG tablet Take 2 mg by mouth every 6 (six) hours as needed for severe pain.    Marland Kitchen levothyroxine (SYNTHROID, LEVOTHROID) 112 MCG tablet Take 112 mcg by mouth daily.    . NON FORMULARY Place 1 mL vaginally 2 (two) times a week. Estradiol 0.8m / ml    . OVER THE COUNTER MEDICATION Apply 1 application topically as needed (for skin). Emu Aid    . ramipril (ALTACE) 10 MG tablet Take 10 mg by mouth daily.     No current facility-administered medications for this visit.    Allergies:  Coffee flavor; Cephalexin; Ciprofloxacin; Keflex; Oatmeal; and Sulfa antibiotics   Social History: The patient  reports that she has never smoked. She has never used smokeless tobacco. 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 She reports that she does not drink alcohol.   Family History: The patient's family history includes Cancer in her maternal aunt; Colon polyps in her brother; Diabetes in her father; Heart disease in her brother, maternal uncle, paternal grandfather, and paternal uncle; Multiple myeloma in her mother.   ROS:  Please see the history of present illness. Otherwise, complete review of systems is positive for bilateral leg pain and weakness as outlined.  All other systems are reviewed and negative.     Physical Exam: VS:  BP 122/64 mmHg  Pulse 77  Ht 5' 1"  (1.549 m)  Wt 169 lb (76.658 kg)  BMI 31.95 kg/m2  SpO2 98%, BMI Body mass index is 31.95 kg/(m^2).  Wt Readings from Last 3 Encounters:  04/30/14 169 lb (76.658 kg)  03/30/14 170 lb 4.8 oz (77.248 kg)  04/11/13 174 lb 4 oz (79.039 kg)     General: Patient appears comfortable at rest. HEENT: Conjunctiva and lids normal, oropharynx clear. Neck: Supple, no elevated JVP or carotid bruits, no thyromegaly. Lungs: Clear to auscultation, nonlabored breathing at rest. Cardiac: Regular rate and rhythm, no S3 or significant systolic murmur, no pericardial rub. Abdomen: Soft, nontender, bowel sounds present, no guarding or rebound. Extremities: No pitting edema, distal pulses 2+. Skin: Warm and dry. Musculoskeletal: Kyphosis noted. Neuropsychiatric: Alert and oriented x3, affect grossly appropriate.   ECG: Tracing from 04/24/2014 reviewed showing sinus rhythm with right bundle branch block and left anterior fascicular block. No old tracing for comparison.  Recent Labwork: 04/24/2014: BUN 24*; Creatinine 1.14*; Hemoglobin 11.1*; Platelets 368; Potassium 4.3; Sodium 136    ASSESSMENT AND PLAN:  1. Preoperative cardiac evaluation prior to elective spine surgery as outlined above. Cardiac risk factors include hypertension, hyperlipidemia, and type 2 diabetes mellitus. Baseline ECG shows right bundle branch block and left anterior fascicular block, no old tracing for comparison. She does not endorse any exertional angina and has NYHA class II dyspnea, functionally limited at baseline due to bilateral leg pain and weakness. Plan is to proceed with a Lexiscan Cardiolite on medical therapy for additional risk stratification. This is being arranged as soon as possible this week with final recommendations to follow.  2. Essential hypertension, blood pressure is controlled today on current regimen.  3. Type 2 diabetes mellitus on Glucovance,  followed by Dr. Dagmar Hait.   Current medicines were reviewed with the patient today. No changes made.   Orders Placed This Encounter  Procedures  . NM Myocar Multi W/Spect W/Wall Motion / EF  . Myocardial Perfusion Imaging    Disposition: Call with results and forward to Dr. Vertell Limber.   Signed, Satira Sark, MD, [facility] 04/30/2014 1:20 PM    [place] [facility] [address], [place], [place] [address] Phone: [phone]; Fax: [phone]

## 2014-04-30 NOTE — Patient Instructions (Signed)
Your physician recommends that you schedule a follow-up appointment in: to be determined   Your physician has requested that you have a lexiscan myoview. For further information please visit [email or web address]. Please follow instruction sheet, as given.    Thank you for choosing [facility] !

## 2014-05-02 ENCOUNTER — Ambulatory Visit (HOSPITAL_COMMUNITY): Payer: BC Managed Care – PPO | Attending: Cardiology | Admitting: Radiology

## 2014-05-02 DIAGNOSIS — R9431 Abnormal electrocardiogram [ECG] [EKG]: Secondary | ICD-10-CM

## 2014-05-02 DIAGNOSIS — Z01818 Encounter for other preprocedural examination: Secondary | ICD-10-CM | POA: Diagnosis not present

## 2014-05-02 MED ORDER — TECHNETIUM TC 99M SESTAMIBI GENERIC - CARDIOLITE
33.0000 | Freq: Once | INTRAVENOUS | Status: AC | PRN
  Administered 2014-05-02: 33 via INTRAVENOUS

## 2014-05-02 MED ORDER — REGADENOSON 0.4 MG/5ML IV SOLN
0.4000 mg | Freq: Once | INTRAVENOUS | Status: AC
  Administered 2014-05-02: 0.4 mg via INTRAVENOUS

## 2014-05-02 MED ORDER — TECHNETIUM TC 99M SESTAMIBI GENERIC - CARDIOLITE
11.0000 | Freq: Once | INTRAVENOUS | Status: AC | PRN
  Administered 2014-05-02: 11 via INTRAVENOUS

## 2014-05-02 NOTE — Progress Notes (Signed)
[facility] [address] [place], [place] [address] [phone]    Cardiology Nuclear Med Study  Rhonda Clayton is a 68 y.o. female     MRN : [id number]     DOB: Apr 08, 1945  Procedure Date: 05/02/2014  Nuclear Med Background Indication for Stress Test:  Evaluation for Ischemia and Surgical Clearance  Back Surgery-Dr. Erline Levine History:  MPI ~10 yrs ago Cardiac Risk Factors: Hypertension, NIDDM and RBBB  Symptoms:  None indicated   Nuclear Pre-Procedure Caffeine/Decaff Intake:  None NPO After: 11:00pm   Lungs:  clear O2 Sat: 99% on room air. IV 0.9% NS with Angio Cath:  22g  IV Site: R Antecubital  IV Started by:  Crissie Figures, RN  Chest Size (in):  42 Cup Size: B  Height: 5\' 1"  (1.549 m)  Weight:  166 lb (75.297 kg)  BMI:  Body mass index is 31.38 kg/(m^2). Tech Comments:  N/A    Nuclear Med Study 1 or 2 day study: 1 day  Stress Test Type:  Lexiscan  Reading MD: N/A  Order Authorizing Provider:  Rozann Lesches, MD  Resting Radionuclide: Technetium 25m Sestamibi  Resting Radionuclide Dose: 11.0 mCi   Stress Radionuclide:  Technetium 83m Sestamibi  Stress Radionuclide Dose: 33.0 mCi           Stress Protocol Rest HR: 69 Stress HR: 97  Rest BP: 161/81 Stress BP: 155/78  Exercise Time (min): n/a METS: n/a   Predicted Max HR: 150 bpm % Max HR: 64.67 bpm Rate Pressure Product: 15423   Dose of Adenosine (mg):  n/a Dose of Lexiscan: 0.4 mg  Dose of Atropine (mg): n/a Dose of Dobutamine: n/a mcg/kg/min (at max HR)  Stress Test Technologist: Glade Lloyd, BS-ES  Nuclear Technologist:  Earl Many, CNMT     Rest Procedure:  Myocardial perfusion imaging was performed at rest 45 minutes following the intravenous administration of Technetium 63m Sestamibi. Rest ECG: NSR RBBB LAFB  Stress Procedure:  The patient received IV Lexiscan 0.4 mg over 15-seconds.  Technetium 81m Sestamibi injected at 30-seconds.  Quantitative spect images were  obtained after a 45 minute delay.  During the infusion of Lexiscan the patient complained of SOB and queasiness.  These symptoms began to resolve in recovery.  Stress ECG: No significant change from baseline ECG  QPS Raw Data Images:  Patient motion noted. Stress Images:  Normal homogeneous uptake in all areas of the myocardium. Rest Images:  Normal homogeneous uptake in all areas of the myocardium. Subtraction (SDS):  No evidence of ischemia. Transient Ischemic Dilatation (Normal <1.22):  0.88 Lung/Heart Ratio (Normal <0.45):  0.27  Quantitative Gated Spect Images QGS EDV:  80 ml QGS ESV:  25 ml  Impression Exercise Capacity:  Lexiscan with no exercise. BP Response:  Normal blood pressure response. Clinical Symptoms:  There is dyspnea. ECG Impression:  No significant ST segment change suggestive of ischemia. Comparison with Prior Nuclear Study: No images to compare  Overall Impression:  Normal stress nuclear study.  LV Ejection Fraction: 69%.  LV Wall Motion:  NL LV Function; NL Wall Motion  Rhonda Clayton

## 2014-05-03 ENCOUNTER — Telehealth: Payer: Self-pay

## 2014-05-03 ENCOUNTER — Other Ambulatory Visit: Payer: Self-pay

## 2014-05-03 NOTE — Telephone Encounter (Signed)
Fax report to Dr Vertell Limber

## 2014-05-03 NOTE — Telephone Encounter (Signed)
-----   Message from Satira Sark, MD sent at 05/03/2014  8:01 AM EDT ----- Cardiolite study done in the [facility] office, please see chart review for full report (what follows below are the copied conclusions). Please let her know that the stress test was normal and that she may proceed with upcoming surgery from a cardiac perspective. She should not require additional cardiac testing prior to upcoming surgery with Dr. Vertell Limber. Please send a copy of the stress test as well as this note to Dr. Vertell Limber.  Impression Exercise Capacity: Lexiscan with no exercise. BP Response: Normal blood pressure response. Clinical Symptoms: There is dyspnea. ECG Impression: No significant ST segment change suggestive of ischemia. Comparison with Prior Nuclear Study: No images to compare  Overall Impression: Normal stress nuclear study.  LV Ejection Fraction: 69%. LV Wall Motion: NL LV Function; NL Wall Motion  Jenkins Rouge

## 2014-05-08 MED ORDER — VANCOMYCIN HCL IN DEXTROSE 1-5 GM/200ML-% IV SOLN: 1000.0000 mg | INTRAVENOUS | Status: DC

## 2014-05-09 ENCOUNTER — Inpatient Hospital Stay (HOSPITAL_COMMUNITY)
Admission: RE | Admit: 2014-05-09 | Discharge: 2014-05-15 | DRG: 454 | Disposition: A | Discharge status: Skilled Nursing Facility | Payer: PPO | Source: Ambulatory Visit | Attending: Neurosurgery | Admitting: Neurosurgery

## 2014-05-09 ENCOUNTER — Inpatient Hospital Stay (HOSPITAL_COMMUNITY): Payer: PPO

## 2014-05-09 ENCOUNTER — Encounter (HOSPITAL_COMMUNITY): Payer: Self-pay | Admitting: Surgery

## 2014-05-09 ENCOUNTER — Inpatient Hospital Stay (HOSPITAL_COMMUNITY): Payer: PPO | Admitting: Vascular Surgery

## 2014-05-09 ENCOUNTER — Inpatient Hospital Stay (HOSPITAL_COMMUNITY): Payer: PPO | Admitting: Certified Registered"

## 2014-05-09 ENCOUNTER — Encounter (HOSPITAL_COMMUNITY)
Admission: RE | Disposition: A | Discharge status: Skilled Nursing Facility | Payer: PPO | Source: Ambulatory Visit | Attending: Neurosurgery

## 2014-05-09 DIAGNOSIS — M4806 Spinal stenosis, lumbar region: Secondary | ICD-10-CM | POA: Diagnosis present

## 2014-05-09 DIAGNOSIS — I10 Essential (primary) hypertension: Secondary | ICD-10-CM | POA: Diagnosis not present

## 2014-05-09 DIAGNOSIS — M4155 Other secondary scoliosis, thoracolumbar region: Secondary | ICD-10-CM | POA: Diagnosis not present

## 2014-05-09 DIAGNOSIS — Z6830 Body mass index (BMI) 30.0-30.9, adult: Secondary | ICD-10-CM | POA: Diagnosis not present

## 2014-05-09 DIAGNOSIS — M5116 Intervertebral disc disorders with radiculopathy, lumbar region: Secondary | ICD-10-CM | POA: Diagnosis present

## 2014-05-09 DIAGNOSIS — D62 Acute posthemorrhagic anemia: Secondary | ICD-10-CM | POA: Diagnosis not present

## 2014-05-09 DIAGNOSIS — M4126 Other idiopathic scoliosis, lumbar region: Secondary | ICD-10-CM | POA: Diagnosis not present

## 2014-05-09 DIAGNOSIS — E669 Obesity, unspecified: Secondary | ICD-10-CM | POA: Diagnosis present

## 2014-05-09 DIAGNOSIS — Z419 Encounter for procedure for purposes other than remedying health state, unspecified: Secondary | ICD-10-CM

## 2014-05-09 DIAGNOSIS — M5126 Other intervertebral disc displacement, lumbar region: Secondary | ICD-10-CM | POA: Diagnosis not present

## 2014-05-09 DIAGNOSIS — M4316 Spondylolisthesis, lumbar region: Secondary | ICD-10-CM | POA: Diagnosis not present

## 2014-05-09 HISTORY — PX: ANTERIOR LAT LUMBAR FUSION: SHX1168

## 2014-05-09 HISTORY — PX: ABDOMINAL EXPOSURE: SHX5708

## 2014-05-09 HISTORY — PX: ANTERIOR LUMBAR FUSION: SHX1170

## 2014-05-09 LAB — POCT I-STAT, CHEM 8
BUN: 22 mg/dL (ref 6–23)
Calcium, Ion: 1.13 mmol/L (ref 1.13–1.30)
Chloride: 99 mmol/L (ref 96–112)
Creatinine, Ser: 1.1 mg/dL (ref 0.50–1.10)
GLUCOSE: 190 mg/dL — AB (ref 70–99)
HEMATOCRIT: 33 % — AB (ref 36.0–46.0)
HEMOGLOBIN: 11.2 g/dL — AB (ref 12.0–15.0)
POTASSIUM: 3.5 mmol/L (ref 3.5–5.1)
Sodium: 134 mmol/L — ABNORMAL LOW (ref 135–145)
TCO2: 20 mmol/L (ref 0–100)

## 2014-05-09 LAB — GLUCOSE, CAPILLARY
GLUCOSE-CAPILLARY: 227 mg/dL — AB (ref 70–99)
Glucose-Capillary: 302 mg/dL — ABNORMAL HIGH (ref 70–99)
Glucose-Capillary: 275 mg/dL — ABNORMAL HIGH (ref 70–99)

## 2014-05-09 SURGERY — ANTERIOR LUMBAR FUSION 2 LEVELS
Anesthesia: General | Site: Flank | Laterality: Right

## 2014-05-09 MED ORDER — RAMIPRIL 10 MG PO CAPS
10.0000 mg | ORAL_CAPSULE | Freq: Every day | ORAL | Status: DC
  Administered 2014-05-11 – 2014-05-15 (×3): 10 mg via ORAL
  Filled 2014-05-09 (×3): qty 2
  Filled 2014-05-09 (×6): qty 1

## 2014-05-09 MED ORDER — VANCOMYCIN HCL IN DEXTROSE 1-5 GM/200ML-% IV SOLN
1000.0000 mg | Freq: Once | INTRAVENOUS | Status: AC
  Administered 2014-05-10: 1000 mg via INTRAVENOUS
  Filled 2014-05-09: qty 200

## 2014-05-09 MED ORDER — HYDROCODONE-ACETAMINOPHEN 5-325 MG PO TABS
1.0000 | ORAL_TABLET | ORAL | Status: DC | PRN
  Administered 2014-05-09 – 2014-05-10 (×3): 1 via ORAL
  Filled 2014-05-09 (×2): qty 1
  Filled 2014-05-09: qty 2

## 2014-05-09 MED ORDER — INSULIN ASPART 100 UNIT/ML ~~LOC~~ SOLN
0.0000 [IU] | Freq: Three times a day (TID) | SUBCUTANEOUS | Status: DC
  Administered 2014-05-09: 11 [IU] via SUBCUTANEOUS
  Administered 2014-05-10: 8 [IU] via SUBCUTANEOUS
  Administered 2014-05-10: 7 [IU] via SUBCUTANEOUS
  Administered 2014-05-11: 11 [IU] via SUBCUTANEOUS
  Administered 2014-05-11: 3 [IU] via SUBCUTANEOUS
  Administered 2014-05-11: 5 [IU] via SUBCUTANEOUS
  Filled 2014-05-09 (×21): qty 0.15

## 2014-05-09 MED ORDER — OXYCODONE-ACETAMINOPHEN 5-325 MG PO TABS
1.0000 | ORAL_TABLET | ORAL | Status: DC | PRN
  Administered 2014-05-11 – 2014-05-14 (×4): 2 via ORAL
  Filled 2014-05-09 (×6): qty 2

## 2014-05-09 MED ORDER — ONDANSETRON HCL 4 MG/2ML IJ SOLN
INTRAMUSCULAR | Status: DC | PRN
  Administered 2014-05-09: 4 mg via INTRAVENOUS

## 2014-05-09 MED ORDER — VANCOMYCIN HCL IN DEXTROSE 1-5 GM/200ML-% IV SOLN
1000.0000 mg | INTRAVENOUS | Status: AC
  Administered 2014-05-09: 1000 mg via INTRAVENOUS
  Filled 2014-05-09: qty 200

## 2014-05-09 MED ORDER — ACETAMINOPHEN 650 MG RE SUPP: 650.0000 mg | RECTAL | Status: DC | PRN

## 2014-05-09 MED ORDER — CHLORHEXIDINE GLUCONATE 4 % EX LIQD
60.0000 mL | Freq: Once | CUTANEOUS | Status: DC
  Filled 2014-05-09: qty 60

## 2014-05-09 MED ORDER — SUCCINYLCHOLINE CHLORIDE 20 MG/ML IJ SOLN
INTRAMUSCULAR | Status: AC
  Filled 2014-05-09: qty 1

## 2014-05-09 MED ORDER — MENTHOL 3 MG MT LOZG
1.0000 | LOZENGE | OROMUCOSAL | Status: DC | PRN
  Filled 2014-05-09: qty 9

## 2014-05-09 MED ORDER — EPHEDRINE SULFATE 50 MG/ML IJ SOLN
INTRAMUSCULAR | Status: DC | PRN
  Administered 2014-05-09: 10 mg via INTRAVENOUS
  Administered 2014-05-09: 5 mg via INTRAVENOUS
  Administered 2014-05-09: 10 mg via INTRAVENOUS

## 2014-05-09 MED ORDER — PROPOFOL INFUSION 10 MG/ML OPTIME
INTRAVENOUS | Status: DC | PRN
  Administered 2014-05-09: 50 ug/kg/min via INTRAVENOUS

## 2014-05-09 MED ORDER — MIDAZOLAM HCL 2 MG/2ML IJ SOLN
INTRAMUSCULAR | Status: AC
  Filled 2014-05-09: qty 2

## 2014-05-09 MED ORDER — HYDROMORPHONE HCL 1 MG/ML IJ SOLN: 0.5000 mg | INTRAMUSCULAR | Status: DC | PRN

## 2014-05-09 MED ORDER — LIDOCAINE-EPINEPHRINE 1 %-1:100000 IJ SOLN
INTRAMUSCULAR | Status: DC | PRN
  Administered 2014-05-09: 4 mL

## 2014-05-09 MED ORDER — METHOCARBAMOL 500 MG PO TABS
500.0000 mg | ORAL_TABLET | Freq: Four times a day (QID) | ORAL | Status: DC | PRN
  Filled 2014-05-09: qty 1

## 2014-05-09 MED ORDER — HYDROMORPHONE HCL 2 MG PO TABS
2.0000 mg | ORAL_TABLET | Freq: Four times a day (QID) | ORAL | Status: DC | PRN
  Administered 2014-05-12 – 2014-05-13 (×4): 2 mg via ORAL
  Filled 2014-05-09 (×4): qty 1

## 2014-05-09 MED ORDER — GABAPENTIN 100 MG PO CAPS
100.0000 mg | ORAL_CAPSULE | Freq: Every day | ORAL | Status: DC
  Administered 2014-05-10 – 2014-05-14 (×6): 100 mg via ORAL
  Filled 2014-05-09 (×7): qty 1

## 2014-05-09 MED ORDER — LEVOTHYROXINE SODIUM 100 MCG PO TABS: 100.0000 ug | ORAL_TABLET | Freq: Every day | ORAL | Status: DC

## 2014-05-09 MED ORDER — ALUM & MAG HYDROXIDE-SIMETH 200-200-20 MG/5ML PO SUSP: 30.0000 mL | Freq: Four times a day (QID) | ORAL | Status: DC | PRN

## 2014-05-09 MED ORDER — FENTANYL CITRATE (PF) 250 MCG/5ML IJ SOLN
INTRAMUSCULAR | Status: AC
  Filled 2014-05-09: qty 5

## 2014-05-09 MED ORDER — SODIUM CHLORIDE 0.9 % IV SOLN
250.0000 mL | INTRAVENOUS | Status: DC
  Administered 2014-05-10: 11:00:00 via INTRAVENOUS

## 2014-05-09 MED ORDER — HYDROCODONE-ACETAMINOPHEN 5-325 MG PO TABS
ORAL_TABLET | ORAL | Status: AC
  Filled 2014-05-09: qty 1

## 2014-05-09 MED ORDER — FENTANYL CITRATE (PF) 100 MCG/2ML IJ SOLN
INTRAMUSCULAR | Status: DC | PRN
  Administered 2014-05-09: 100 ug via INTRAVENOUS
  Administered 2014-05-09 (×3): 50 ug via INTRAVENOUS
  Administered 2014-05-09 (×2): 100 ug via INTRAVENOUS
  Administered 2014-05-09 (×2): 50 ug via INTRAVENOUS

## 2014-05-09 MED ORDER — FLEET ENEMA 7-19 GM/118ML RE ENEM
1.0000 | ENEMA | Freq: Once | RECTAL | Status: AC | PRN
  Filled 2014-05-09: qty 1

## 2014-05-09 MED ORDER — HYDROCHLOROTHIAZIDE 12.5 MG PO CAPS
12.5000 mg | ORAL_CAPSULE | Freq: Every day | ORAL | Status: DC
  Administered 2014-05-11 – 2014-05-15 (×3): 12.5 mg via ORAL
  Filled 2014-05-09 (×4): qty 1

## 2014-05-09 MED ORDER — KCL IN DEXTROSE-NACL 20-5-0.45 MEQ/L-%-% IV SOLN
INTRAVENOUS | Status: DC
  Administered 2014-05-09: 75 mL/h via INTRAVENOUS
  Filled 2014-05-09 (×3): qty 1000

## 2014-05-09 MED ORDER — BISACODYL 10 MG RE SUPP: 10.0000 mg | Freq: Every day | RECTAL | Status: DC | PRN

## 2014-05-09 MED ORDER — LIDOCAINE HCL (CARDIAC) 20 MG/ML IV SOLN
INTRAVENOUS | Status: DC | PRN
  Administered 2014-05-09: 60 mg via INTRAVENOUS

## 2014-05-09 MED ORDER — ARTIFICIAL TEARS OP OINT
TOPICAL_OINTMENT | OPHTHALMIC | Status: AC
  Filled 2014-05-09: qty 3.5

## 2014-05-09 MED ORDER — HYDROMORPHONE HCL 1 MG/ML IJ SOLN
INTRAMUSCULAR | Status: AC
  Filled 2014-05-09: qty 1

## 2014-05-09 MED ORDER — PHENYLEPHRINE 40 MCG/ML (10ML) SYRINGE FOR IV PUSH (FOR BLOOD PRESSURE SUPPORT)
PREFILLED_SYRINGE | INTRAVENOUS | Status: AC
  Filled 2014-05-09: qty 10

## 2014-05-09 MED ORDER — STERILE WATER FOR INJECTION IJ SOLN
INTRAMUSCULAR | Status: AC
  Filled 2014-05-09: qty 10

## 2014-05-09 MED ORDER — PANTOPRAZOLE SODIUM 40 MG IV SOLR
40.0000 mg | Freq: Every day | INTRAVENOUS | Status: DC
  Administered 2014-05-10: 40 mg via INTRAVENOUS
  Filled 2014-05-09 (×2): qty 40

## 2014-05-09 MED ORDER — INSULIN ASPART 100 UNIT/ML ~~LOC~~ SOLN
0.0000 [IU] | Freq: Every day | SUBCUTANEOUS | Status: DC
  Administered 2014-05-09: 2 [IU] via SUBCUTANEOUS
  Filled 2014-05-09 (×8): qty 0.05

## 2014-05-09 MED ORDER — ONDANSETRON HCL 4 MG/2ML IJ SOLN: 4.0000 mg | Freq: Four times a day (QID) | INTRAMUSCULAR | Status: DC | PRN

## 2014-05-09 MED ORDER — 0.9 % SODIUM CHLORIDE (POUR BTL) OPTIME
TOPICAL | Status: DC | PRN
  Administered 2014-05-09: 1000 mL

## 2014-05-09 MED ORDER — GLYBURIDE 5 MG PO TABS
5.0000 mg | ORAL_TABLET | Freq: Two times a day (BID) | ORAL | Status: DC
  Filled 2014-05-09 (×3): qty 1

## 2014-05-09 MED ORDER — PHENYLEPHRINE HCL 10 MG/ML IJ SOLN
INTRAMUSCULAR | Status: DC | PRN
  Administered 2014-05-09 (×2): 80 ug via INTRAVENOUS
  Administered 2014-05-09: 120 ug via INTRAVENOUS
  Administered 2014-05-09 (×2): 80 ug via INTRAVENOUS
  Administered 2014-05-09: 40 ug via INTRAVENOUS
  Administered 2014-05-09 (×2): 80 ug via INTRAVENOUS

## 2014-05-09 MED ORDER — PROPOFOL 10 MG/ML IV BOLUS
INTRAVENOUS | Status: DC | PRN
  Administered 2014-05-09: 140 mg via INTRAVENOUS

## 2014-05-09 MED ORDER — DOCUSATE SODIUM 100 MG PO CAPS
100.0000 mg | ORAL_CAPSULE | Freq: Two times a day (BID) | ORAL | Status: DC
  Administered 2014-05-10 – 2014-05-15 (×10): 100 mg via ORAL
  Filled 2014-05-09 (×12): qty 1

## 2014-05-09 MED ORDER — LEVOTHYROXINE SODIUM 100 MCG PO TABS
100.0000 ug | ORAL_TABLET | Freq: Every day | ORAL | Status: DC
  Administered 2014-05-11 – 2014-05-15 (×5): 100 ug via ORAL
  Filled 2014-05-09 (×7): qty 1

## 2014-05-09 MED ORDER — SODIUM CHLORIDE 0.9 % IJ SOLN
INTRAMUSCULAR | Status: AC
  Filled 2014-05-09: qty 10

## 2014-05-09 MED ORDER — VECURONIUM BROMIDE 10 MG IV SOLR
INTRAVENOUS | Status: AC
  Filled 2014-05-09: qty 10

## 2014-05-09 MED ORDER — INSULIN ASPART 100 UNIT/ML ~~LOC~~ SOLN
5.0000 [IU] | Freq: Once | SUBCUTANEOUS | Status: AC
  Administered 2014-05-09: 5 [IU] via SUBCUTANEOUS

## 2014-05-09 MED ORDER — MIDAZOLAM HCL 5 MG/5ML IJ SOLN
INTRAMUSCULAR | Status: DC | PRN
  Administered 2014-05-09: 2 mg via INTRAVENOUS

## 2014-05-09 MED ORDER — METFORMIN HCL 500 MG PO TABS
500.0000 mg | ORAL_TABLET | Freq: Two times a day (BID) | ORAL | Status: DC
  Filled 2014-05-09 (×3): qty 1

## 2014-05-09 MED ORDER — THROMBIN 5000 UNITS EX SOLR
CUTANEOUS | Status: DC | PRN
  Administered 2014-05-09 (×2): 5000 [IU] via TOPICAL

## 2014-05-09 MED ORDER — DEXTROSE 5 % IV SOLN
500.0000 mg | Freq: Four times a day (QID) | INTRAVENOUS | Status: DC | PRN
  Filled 2014-05-09: qty 5

## 2014-05-09 MED ORDER — ARTIFICIAL TEARS OP OINT
TOPICAL_OINTMENT | OPHTHALMIC | Status: DC | PRN
  Administered 2014-05-09: 1 via OPHTHALMIC

## 2014-05-09 MED ORDER — INSULIN ASPART 100 UNIT/ML ~~LOC~~ SOLN
SUBCUTANEOUS | Status: AC
  Filled 2014-05-09: qty 1

## 2014-05-09 MED ORDER — PHENOL 1.4 % MT LIQD: 1.0000 | OROMUCOSAL | Status: DC | PRN

## 2014-05-09 MED ORDER — BUPIVACAINE HCL (PF) 0.5 % IJ SOLN
INTRAMUSCULAR | Status: DC | PRN
  Administered 2014-05-09: 4 mL

## 2014-05-09 MED ORDER — INSULIN LISPRO 100 UNIT/ML ~~LOC~~ SOLN: 5.0000 [IU] | Freq: Once | SUBCUTANEOUS | Status: DC

## 2014-05-09 MED ORDER — POLYETHYLENE GLYCOL 3350 17 G PO PACK
17.0000 g | PACK | Freq: Every day | ORAL | Status: DC | PRN
  Filled 2014-05-09: qty 1

## 2014-05-09 MED ORDER — ROCURONIUM BROMIDE 50 MG/5ML IV SOLN
INTRAVENOUS | Status: AC
  Filled 2014-05-09: qty 1

## 2014-05-09 MED ORDER — PHENYLEPHRINE HCL 10 MG/ML IJ SOLN
10.0000 mg | INTRAVENOUS | Status: DC | PRN
  Administered 2014-05-09: 10 ug/min via INTRAVENOUS

## 2014-05-09 MED ORDER — SODIUM CHLORIDE 0.9 % IJ SOLN
3.0000 mL | Freq: Two times a day (BID) | INTRAMUSCULAR | Status: DC
  Administered 2014-05-10: 3 mL via INTRAVENOUS

## 2014-05-09 MED ORDER — LIDOCAINE HCL (CARDIAC) 20 MG/ML IV SOLN
INTRAVENOUS | Status: AC
  Filled 2014-05-09: qty 5

## 2014-05-09 MED ORDER — INSULIN ASPART 100 UNIT/ML ~~LOC~~ SOLN
SUBCUTANEOUS | Status: AC
  Administered 2014-05-09: 5 [IU]
  Filled 2014-05-09: qty 1

## 2014-05-09 MED ORDER — ZOLPIDEM TARTRATE 5 MG PO TABS
5.0000 mg | ORAL_TABLET | Freq: Every evening | ORAL | Status: DC | PRN
  Administered 2014-05-11: 5 mg via ORAL
  Filled 2014-05-09: qty 1

## 2014-05-09 MED ORDER — ONDANSETRON HCL 4 MG/2ML IJ SOLN
INTRAMUSCULAR | Status: AC
  Filled 2014-05-09: qty 2

## 2014-05-09 MED ORDER — AMLODIPINE BESYLATE 5 MG PO TABS
5.0000 mg | ORAL_TABLET | Freq: Every day | ORAL | Status: DC
  Administered 2014-05-11 – 2014-05-15 (×3): 5 mg via ORAL
  Filled 2014-05-09 (×4): qty 1

## 2014-05-09 MED ORDER — CLOBETASOL PROPIONATE 0.05 % EX OINT
1.0000 "application " | TOPICAL_OINTMENT | Freq: Every day | CUTANEOUS | Status: DC
  Filled 2014-05-09: qty 15

## 2014-05-09 MED ORDER — VITAMIN D 1000 UNITS PO TABS
1000.0000 [IU] | ORAL_TABLET | Freq: Two times a day (BID) | ORAL | Status: DC
  Administered 2014-05-10 – 2014-05-15 (×10): 1000 [IU] via ORAL
  Filled 2014-05-09 (×12): qty 1

## 2014-05-09 MED ORDER — PROPOFOL 10 MG/ML IV BOLUS
INTRAVENOUS | Status: AC
  Filled 2014-05-09: qty 20

## 2014-05-09 MED ORDER — GLYBURIDE-METFORMIN 5-500 MG PO TABS: 2.0000 | ORAL_TABLET | Freq: Two times a day (BID) | ORAL | Status: DC

## 2014-05-09 MED ORDER — LACTATED RINGERS IV SOLN
INTRAVENOUS | Status: DC
  Administered 2014-05-09 (×2): via INTRAVENOUS
  Administered 2014-05-09: 20 mL/h via INTRAVENOUS
  Administered 2014-05-09 (×2): via INTRAVENOUS

## 2014-05-09 MED ORDER — SODIUM CHLORIDE 0.9 % IV SOLN
INTRAVENOUS | Status: DC | PRN
  Administered 2014-05-09: 17:00:00 via INTRAVENOUS

## 2014-05-09 MED ORDER — OXYCODONE HCL 5 MG/5ML PO SOLN: 5.0000 mg | Freq: Once | ORAL | Status: DC | PRN

## 2014-05-09 MED ORDER — ROCURONIUM BROMIDE 100 MG/10ML IV SOLN
INTRAVENOUS | Status: DC | PRN
  Administered 2014-05-09: 50 mg via INTRAVENOUS

## 2014-05-09 MED ORDER — SODIUM CHLORIDE 0.9 % IJ SOLN: 3.0000 mL | INTRAMUSCULAR | Status: DC | PRN

## 2014-05-09 MED ORDER — ONDANSETRON HCL 4 MG/2ML IJ SOLN: 4.0000 mg | INTRAMUSCULAR | Status: DC | PRN

## 2014-05-09 MED ORDER — ACETAMINOPHEN 325 MG PO TABS: 650.0000 mg | ORAL_TABLET | ORAL | Status: DC | PRN

## 2014-05-09 MED ORDER — EPHEDRINE SULFATE 50 MG/ML IJ SOLN
INTRAMUSCULAR | Status: AC
  Filled 2014-05-09: qty 1

## 2014-05-09 MED ORDER — HYDROMORPHONE HCL 1 MG/ML IJ SOLN
0.2500 mg | INTRAMUSCULAR | Status: DC | PRN
  Administered 2014-05-09 (×4): 0.5 mg via INTRAVENOUS

## 2014-05-09 MED ORDER — VECURONIUM BROMIDE 10 MG IV SOLR
INTRAVENOUS | Status: DC | PRN
  Administered 2014-05-09 (×2): 2 mg via INTRAVENOUS
  Administered 2014-05-09 (×2): 3 mg via INTRAVENOUS
  Administered 2014-05-09 (×3): 2 mg via INTRAVENOUS

## 2014-05-09 MED ORDER — HEMOSTATIC AGENTS (NO CHARGE) OPTIME
TOPICAL | Status: DC | PRN
  Administered 2014-05-09: 1 via TOPICAL

## 2014-05-09 MED ORDER — OXYCODONE HCL 5 MG PO TABS: 5.0000 mg | ORAL_TABLET | Freq: Once | ORAL | Status: DC | PRN

## 2014-05-09 SURGICAL SUPPLY — 123 items
ADH SKN CLS LQ APL DERMABOND (GAUZE/BANDAGES/DRESSINGS) ×4
APPLIER CLIP 11 MED OPEN (CLIP) ×8
APR CLP MED 11 20 MLT OPN (CLIP) ×4
BLADE CLIPPER SURG (BLADE) IMPLANT
BUR BARREL STRAIGHT FLUTE 4.0 (BURR) ×2 IMPLANT
CAGE BRIGADE HYLORD 6X34X24-20 (Cage) ×2 IMPLANT
CANISTER SUCT 3000ML PPV (MISCELLANEOUS) ×4 IMPLANT
CLIP APPLIE 11 MED OPEN (CLIP) ×4 IMPLANT
CONT SPEC 4OZ CLIKSEAL STRL BL (MISCELLANEOUS) ×6 IMPLANT
COROENT XL WIDE LORDO 10X22X45 (Intraocular Lens) ×2 IMPLANT
COROENT XL-W 8X22X50 (Orthopedic Implant) ×2 IMPLANT
COVER BACK TABLE 24X17X13 BIG (DRAPES) ×2 IMPLANT
DECANTER SPIKE VIAL GLASS SM (MISCELLANEOUS) ×6 IMPLANT
DERMABOND ADHESIVE PROPEN (GAUZE/BANDAGES/DRESSINGS) ×4
DERMABOND ADVANCED .7 DNX6 (GAUZE/BANDAGES/DRESSINGS) IMPLANT
DRAPE C-ARM 42X72 X-RAY (DRAPES) ×10 IMPLANT
DRAPE C-ARMOR (DRAPES) ×6 IMPLANT
DRAPE INCISE IOBAN 66X45 STRL (DRAPES) IMPLANT
DRAPE LAPAROTOMY 100X72X124 (DRAPES) ×8 IMPLANT
DRAPE POUCH INSTRU U-SHP 10X18 (DRAPES) ×8 IMPLANT
DRSG OPSITE POSTOP 3X4 (GAUZE/BANDAGES/DRESSINGS) ×4 IMPLANT
DRSG OPSITE POSTOP 4X8 (GAUZE/BANDAGES/DRESSINGS) ×2 IMPLANT
DRSG TELFA 3X8 NADH (GAUZE/BANDAGES/DRESSINGS) IMPLANT
DURAPREP 26ML APPLICATOR (WOUND CARE) ×8 IMPLANT
ELECT BLADE 4.0 EZ CLEAN MEGAD (MISCELLANEOUS) ×4
ELECT REM PT RETURN 9FT ADLT (ELECTROSURGICAL) ×8
ELECTRODE BLDE 4.0 EZ CLN MEGD (MISCELLANEOUS) ×2 IMPLANT
ELECTRODE REM PT RTRN 9FT ADLT (ELECTROSURGICAL) ×4 IMPLANT
GAUZE SPONGE 4X4 12PLY STRL (GAUZE/BANDAGES/DRESSINGS) ×2 IMPLANT
GAUZE SPONGE 4X4 16PLY XRAY LF (GAUZE/BANDAGES/DRESSINGS) IMPLANT
GLOVE BIO SURGEON STRL SZ8 (GLOVE) ×10 IMPLANT
GLOVE BIOGEL PI IND STRL 7.5 (GLOVE) IMPLANT
GLOVE BIOGEL PI IND STRL 8 (GLOVE) ×2 IMPLANT
GLOVE BIOGEL PI IND STRL 8.5 (GLOVE) ×4 IMPLANT
GLOVE BIOGEL PI INDICATOR 7.5 (GLOVE) ×6
GLOVE BIOGEL PI INDICATOR 8 (GLOVE) ×12
GLOVE BIOGEL PI INDICATOR 8.5 (GLOVE) ×4
GLOVE ECLIPSE 7.5 STRL STRAW (GLOVE) ×12 IMPLANT
GLOVE ECLIPSE 8.0 STRL XLNG CF (GLOVE) ×6 IMPLANT
GLOVE EXAM NITRILE LRG STRL (GLOVE) IMPLANT
GLOVE EXAM NITRILE MD LF STRL (GLOVE) IMPLANT
GLOVE EXAM NITRILE XL STR (GLOVE) IMPLANT
GLOVE EXAM NITRILE XS STR PU (GLOVE) IMPLANT
GLOVE SS BIOGEL STRL SZ 7.5 (GLOVE) ×2 IMPLANT
GLOVE SUPERSENSE BIOGEL SZ 7.5 (GLOVE) ×6
GLOVE SURG SS PI 7.0 STRL IVOR (GLOVE) ×6 IMPLANT
GOWN STRL NON-REIN LRG LVL3 (GOWN DISPOSABLE) ×2 IMPLANT
GOWN STRL REUS W/ TWL LRG LVL3 (GOWN DISPOSABLE) IMPLANT
GOWN STRL REUS W/ TWL XL LVL3 (GOWN DISPOSABLE) ×4 IMPLANT
GOWN STRL REUS W/TWL 2XL LVL3 (GOWN DISPOSABLE) ×10 IMPLANT
GOWN STRL REUS W/TWL LRG LVL3 (GOWN DISPOSABLE) ×12
GOWN STRL REUS W/TWL XL LVL3 (GOWN DISPOSABLE) ×16
IMPL BRIG 12X34X24-12 SPINE (Spacer) IMPLANT
IMPLANT BRIG 12X34X24-12 SPINE (Spacer) ×4 IMPLANT
INSERT FOGARTY 61MM (MISCELLANEOUS) IMPLANT
INSERT FOGARTY SM (MISCELLANEOUS) IMPLANT
KIT BASIN OR (CUSTOM PROCEDURE TRAY) ×8 IMPLANT
KIT DILATOR XLIF 5 (KITS) IMPLANT
KIT INFUSE MEDIUM (Orthopedic Implant) ×2 IMPLANT
KIT NDL NVM5 EMG ELECT (KITS) IMPLANT
KIT NEEDLE NVM5 EMG ELECT (KITS) ×2 IMPLANT
KIT NEEDLE NVM5 EMG ELECTRODE (KITS) ×2
KIT ROOM TURNOVER OR (KITS) ×10 IMPLANT
KIT SURGICAL ACCESS MAXCESS 4 (KITS) ×2 IMPLANT
KIT XLIF (KITS) ×2
LIQUID BAND (GAUZE/BANDAGES/DRESSINGS) ×8 IMPLANT
LOOP VESSEL MAXI BLUE (MISCELLANEOUS) IMPLANT
LOOP VESSEL MINI RED (MISCELLANEOUS) IMPLANT
NDL ASPIRATION RAN815N 8X15 (NEEDLE) IMPLANT
NDL HYPO 25X1 1.5 SAFETY (NEEDLE) ×4 IMPLANT
NDL SPNL 18GX3.5 QUINCKE PK (NEEDLE) IMPLANT
NEEDLE ASPIRATION RAN815N 8X15 (NEEDLE) ×2 IMPLANT
NEEDLE ASPIRATION RANFAC 8X15 (NEEDLE) ×2
NEEDLE HYPO 22GX1.5 SAFETY (NEEDLE) IMPLANT
NEEDLE HYPO 25X1 1.5 SAFETY (NEEDLE) ×4 IMPLANT
NEEDLE SPNL 18GX3.5 QUINCKE PK (NEEDLE) ×4 IMPLANT
NS IRRIG 1000ML POUR BTL (IV SOLUTION) ×6 IMPLANT
PACK FOAM VITOSS 10CC (Orthopedic Implant) ×2 IMPLANT
PACK FOAM VITOSS 5CC (Orthopedic Implant) ×2 IMPLANT
PACK LAMINECTOMY NEURO (CUSTOM PROCEDURE TRAY) ×8 IMPLANT
PAD ARMBOARD 7.5X6 YLW CONV (MISCELLANEOUS) ×10 IMPLANT
PAD DRESSING TELFA 3X8 NADH (GAUZE/BANDAGES/DRESSINGS) IMPLANT
SCREW BONE 4.5X22MM (Screw) ×8 IMPLANT
SPONGE INTESTINAL PEANUT (DISPOSABLE) ×18 IMPLANT
SPONGE LAP 18X18 X RAY DECT (DISPOSABLE) ×4 IMPLANT
SPONGE LAP 4X18 X RAY DECT (DISPOSABLE) IMPLANT
SPONGE SURGIFOAM ABS GEL SZ50 (HEMOSTASIS) ×2 IMPLANT
STAPLER SKIN PROX WIDE 3.9 (STAPLE) ×2 IMPLANT
STAPLER VISISTAT 35W (STAPLE) ×2 IMPLANT
SUT MNCRL AB 4-0 PS2 18 (SUTURE) ×2 IMPLANT
SUT PROLENE 0 CT 1 30 (SUTURE) ×2 IMPLANT
SUT PROLENE 4 0 RB 1 (SUTURE)
SUT PROLENE 4-0 RB1 .5 CRCL 36 (SUTURE) ×8 IMPLANT
SUT PROLENE 5 0 CC1 (SUTURE) IMPLANT
SUT PROLENE 6 0 C 1 30 (SUTURE) ×2 IMPLANT
SUT PROLENE 6 0 CC (SUTURE) IMPLANT
SUT SILK 0 TIES 10X30 (SUTURE) ×2 IMPLANT
SUT SILK 2 0 TIES 10X30 (SUTURE) ×4 IMPLANT
SUT SILK 2 0 TIES 17X18 (SUTURE)
SUT SILK 2 0SH CR/8 30 (SUTURE) IMPLANT
SUT SILK 2-0 18XBRD TIE BLK (SUTURE) ×2 IMPLANT
SUT SILK 3 0 TIES 10X30 (SUTURE) ×2 IMPLANT
SUT SILK 3 0SH CR/8 30 (SUTURE) IMPLANT
SUT VIC AB 0 CT1 27 (SUTURE)
SUT VIC AB 0 CT1 27XBRD ANBCTR (SUTURE) ×6 IMPLANT
SUT VIC AB 1 CT1 18XBRD ANBCTR (SUTURE) ×4 IMPLANT
SUT VIC AB 1 CT1 8-18 (SUTURE) ×8
SUT VIC AB 2-0 CT1 18 (SUTURE) ×10 IMPLANT
SUT VIC AB 2-0 CT1 27 (SUTURE)
SUT VIC AB 2-0 CT1 27XBRD (SUTURE) ×4 IMPLANT
SUT VIC AB 2-0 CT1 36 (SUTURE) ×2 IMPLANT
SUT VIC AB 3-0 SH 27 (SUTURE) ×4
SUT VIC AB 3-0 SH 27X BRD (SUTURE) ×2 IMPLANT
SUT VIC AB 3-0 SH 8-18 (SUTURE) ×14 IMPLANT
SUT VICRYL 4-0 PS2 18IN ABS (SUTURE) IMPLANT
SYR 20ML ECCENTRIC (SYRINGE) ×6 IMPLANT
SYR CONTROL 10ML LL (SYRINGE) ×2 IMPLANT
TAPE CLOTH 3X10 TAN LF (GAUZE/BANDAGES/DRESSINGS) ×8 IMPLANT
TOWEL OR 17X24 6PK STRL BLUE (TOWEL DISPOSABLE) ×12 IMPLANT
TOWEL OR 17X26 10 PK STRL BLUE (TOWEL DISPOSABLE) ×8 IMPLANT
TRAP SPECIMEN MUCOUS 40CC (MISCELLANEOUS) ×4 IMPLANT
TRAY FOLEY CATH 14FRSI W/METER (CATHETERS) ×6 IMPLANT
WATER STERILE IRR 1000ML POUR (IV SOLUTION) ×6 IMPLANT

## 2014-05-09 NOTE — Anesthesia Preprocedure Evaluation (Addendum)
Anesthesia Evaluation  Patient identified by MRN, date of birth, ID band Patient awake    Reviewed: Allergy & Precautions, NPO status , Patient's Chart, lab work & pertinent test results  Airway Mallampati: II  TM Distance: >3 FB Neck ROM: full    Dental  (+) Teeth Intact, Dental Advisory Given   Pulmonary neg pulmonary ROS,  breath sounds clear to auscultation        Cardiovascular hypertension, + Peripheral Vascular Disease + dysrhythmias Rhythm:regular Rate:Normal  RBBB with LAFB   Neuro/Psych    GI/Hepatic   Endo/Other  diabetes, Type 2Hypothyroidism   Renal/GU      Musculoskeletal   Abdominal   Peds  Hematology   Anesthesia Other Findings   Reproductive/Obstetrics                            Anesthesia Physical Anesthesia Plan  ASA: II  Anesthesia Plan: General   Post-op Pain Management:    Induction: Intravenous  Airway Management Planned: Oral ETT  Additional Equipment:   Intra-op Plan:   Post-operative Plan: Extubation in OR  Informed Consent: I have reviewed the patients History and Physical, chart, labs and discussed the procedure including the risks, benefits and alternatives for the proposed anesthesia with the patient or authorized representative who has indicated his/her understanding and acceptance.     Plan Discussed with: CRNA, Anesthesiologist and Surgeon  Anesthesia Plan Comments:         Anesthesia Quick Evaluation

## 2014-05-09 NOTE — Progress Notes (Signed)
Awake, alert, conversant.  MAEW.  Doing well.  

## 2014-05-09 NOTE — Op Note (Signed)
05/09/2014  6:39 PM  PATIENT:  Rhonda Clayton  69 y.o. female  PRE-OPERATIVE DIAGNOSIS:  Idiopathic scoliosis of lumbar region; Low back pain, unspecified; Radiculopathy, Lumbar region, spondylolisthesis, spondylolysis, stenosis  POST-OPERATIVE DIAGNOSIS:  Idiopathic scoliosis of lumbar region; Low back pain, unspecified; Radiculopathy, Lumbar region, spondylolisthesis, spondylolysis, stenosis   PROCEDURE:  Procedure(s): Stage 1:Lumbar four-five, Lumbar five-Sacral one Anterior lumbar interbody fusion with Dr. Donnetta Hutching for approach  (N/A) Lumbar three-four, Lumbar four-five Anterior lateral lumbar fusion (Right) with PEEK cages, anterior lumbar fixation ABDOMINAL EXPOSURE (N/A)  SURGEON:  Surgeon(s) and Role: Panel 1:    * Erline Levine, MD - Primary    * Ashok Pall, MD - Assisting  Panel 2:    * Rosetta Posner, MD - Primary  PHYSICIAN ASSISTANT:   ASSISTANTS: Poteat, RN   ANESTHESIA:   general  EBL:  Total I/O In: 41 [I.V.:3200] Out: 32 [Urine:500; Blood:200]  BLOOD ADMINISTERED:none  DRAINS: none   LOCAL MEDICATIONS USED:  LIDOCAINE   SPECIMEN:  No Specimen  DISPOSITION OF SPECIMEN:  N/A  COUNTS:  YES  TOURNIQUET:  * No tourniquets in log *  DICTATION: INDICATIONS:  Pateint is 69 year old female with chronic and intractable back and bilateral lower extremity pain with marked disc degeneration and scoliosis, with spondylolisthesis and spondylolysis with  Stenosis throughout the thoracolumbar spine.  This is a planned two stage surgery, the first part consists of ALIF L 45 and L 5 S1 levels and anterolateral decompression and fusion L 34 and L 23 levels. The second stage will involve posterior decompression and fusion from T 10 through the ileum. It was elected to take her to surgery for anterior lumbar decompression and fusion at the L 5 S1 level.  PROCEDURE:  Doctor Early  performed exposure and his portion of the procedure will be dictated separately.  Upon  exposing the L 5 S1 level, a localizing X ray was obtained with the C arm.  I then incised the anterior annulus and performed a thorough discectomy.  The endplates were cleared of disc and cartilagenous material and a thorough discectomy was performed with decompression of the ventral annulus and disc material.  After trial, a 12 degree lordotic x 24 x mm Nuvasive PEEK spacer was selected, packed with mediumBMP and Vitoss which was reconstituted with marrow rich blood aspirated from the L 5 vertebral body.  The implant was tamped into position and positioning was confirmed with C arm. 4.5 x 22.5 mm screws were placed through the implant into the vertebrae, one at L 5 and one at S1.  Locking mechanisms were engaged, soft tissues were inspected and found to be in good repair.  Retractors were then repositioned by Dr. Donnetta Hutching and upon exposing the L 4  L 5 level, a localizing X ray was obtained with the C arm.  I then incised the anterior annulus and performed a thorough discectomy.  The endplates were cleared of disc and cartilagenous material and a thorough discectomy was performed with decompression of the ventral annulus and disc material. There was considerable anterolisthesis which was reduced with sequential trials and clearing of the interspace.  After trial, a 20 degree lordotic 6 x 24 x 34 mm Nuvasive PEEK spacer was selected, packed with mediumBMP and Vitoss which was reconstituted with marrow rich blood aspirated from the L 5 vertebral body.  The implant was tamped into position and positioning was confirmed with C arm. 4.5 x 22.5 mm screws were placed through the implant  into the vertebrae, one at L 4 and one at L 5.  Locking mechanisms were engaged, soft tissues were inspected and found to be in good repair. .  Fascia was closed with 0 vicryl running stitch, skin edges closed with 2-0 and 3-0 vicryl sutures.  Wound was dressed with a sterile occlusive dressing.  Patient was extubated in the OR and taken to  recovery having tolerated her surgery well.  Counts were correct.  The patient was then repositioned on her left side in a lateral decubitus position on the operative table and using orthogonally projected C-arm fluoroscopy the patient was placed so that the L2-3 L3-4  levels were visualized in AP and lateral plane. The patient was then taped into position. The table was flexed so as to expose the L 34t. Skin was marked along with a posterior finger dissection incision. His flank was then prepped and draped in usual sterile fashion and incisions were made sequentially at L3-4 and L2-3 levels. Posterior finger dissection was made to enter the retroperitoneal space and then subsequently the probe was inserted into the psoas muscle from the right side initially at the L34 level. After mapping the neural elements were able to dock the probe per the midpoint of this vertebral level and without indications electrically of too close proximity to the neural tissues. Subsequently the self-retaining tractor was.after sequential dilators were utilized the shim was employed and the interspace was cleared of psoas muscle and then incised. A thorough discectomy was performed. Instruments were used to clear the interspace of disc material. After thorough discectomy was performed and this was performed using AP and lateral fluoroscopy an 8 lordotic by 50x 22 mm implant was packed with BMP and Vitoss with autologous blood. This was tamped into position using the slides and its position was confirmed on AP and lateral fluoroscopy. The considerable alteral listhesis of L 3 on L 4 was reduced with this cage placement.  Subsequently exposure was performed at the L 23 level and similar dissection was performed with locking of the self-retaining retractor. At this level were able to place a 10 lordotic by 22 x 45 mm implant packed in a similar fashion.  Hemostasis was assured the wounds were irrigated interrupted Vicryl sutures. Sterile  occlusive dressing was placed with Dermabond. The patient was then extubated in the operating room and taken to recovery in stable and satisfactory condition having tolerated his operation well. Counts were correct at the end of the case.    PLAN OF CARE: Admit to inpatient   PATIENT DISPOSITION:  PACU - hemodynamically stable.   Delay start of Pharmacological VTE agent (>24hrs) due to surgical blood loss or risk of bleeding: yes

## 2014-05-09 NOTE — Interval H&P Note (Signed)
History and Physical Interval Note:  05/09/2014 11:29 AM  Rhonda Clayton  has presented today for surgery, with the diagnosis of Idiopathic scoliosis of lumbar region; Low back pain, unspecified; Radiculopathy, Lumbar region  The various methods of treatment have been discussed with the patient and family. After consideration of risks, benefits and other options for treatment, the patient has consented to  Procedure(s) with comments: Stage 1:L4-5/ L5-S1 Anterior lumbar interbody fusion with Dr. Scot Dock for approach  Possible - L1-2 L2-3 L3-4 Anterior lateral lumbar fusion;  Laminectomy L3-4  (N/A) - Stage 1:L4-5/ L5-S1 Anterior lumbar interbody fusion with Dr. Scot Dock for approach L1-2 L2-3 L3-4 Anterior lateral lumbar fusion;  Laminectomy L3-4 (possible this day) (N/A) ABDOMINAL EXPOSURE (N/A) as a surgical intervention .  The patient's history has been reviewed, patient examined, no change in status, stable for surgery.  I have reviewed the patient's chart and labs.  Questions were answered to the patient's satisfaction.     Richardo Popoff D

## 2014-05-09 NOTE — Op Note (Signed)
    OPERATIVE REPORT  DATE OF SURGERY: 05/09/2014  PATIENT: Rhonda Clayton, 69 y.o. female MRN: [id number]  DOB: Aug 30, 1945  PRE-OPERATIVE DIAGNOSIS: Degenerative disc disease  POST-OPERATIVE DIAGNOSIS:  Same  PROCEDURE: Anterior exposure for L4-5 and L5-S1 disc surgery  SURGEON:  Curt Jews, M.D.  Co-surgeon for the exposure Dierdre Harness, M.D.  ANESTHESIA:  Gen.  EBL: 200 ml  Total I/O In: 2000 [I.V.:2000] Out: 580 [Urine:380; Blood:200]  BLOOD ADMINISTERED: None  DRAINS: None    COUNTS CORRECT:  YES  PLAN OF CARE: The patient was placed in supine position where the area the abdomen  Usual fashion. Lateral C-arm projections used to demonstrate the level of the L5-S1 and L4-5 disc on the skin. Patient had a great deal of weight loss and had a very large low hanging panniculus. A paramedian incision was made over the level of the rectus muscle to the left of the midline. This was carried down through the anterior rectus sheath and the rectus muscle was mobilized circumferentially. The patient did have a diastases rectus and the relatively atretic rectus muscle. The retroperitoneum was entered bluntly below the level of the semilunar line. The risks peroneal space was mobilized bluntly and the posterior rectus sheath was opened laterally to give better mobilization. Ureter was identified and was mobilized to the right. Dissection was continued to the level of the vertebral discs. Initially the L4-5 disc was exposed. This was done by mobilizing the aorta to the right. Lumbar arteries and veins were divided after being clipped with ligaclips. The iliolumbar vein was ligated with a 2-0 silk ties and divided. Mobilization was continued to the right. When adequate exposure of L4-5 was obtained the dissection was a focus between the level of the iliac vessels. The middle sacral vessels were very prominent and were clipped with ligaclips. These were divided. Blunt dissection over the L5-S1  disc was used to expose this disc between the level of the right and left iliac vessels. The Thompson retractor was brought onto the field. The reverse lip 200 m blades were brought onto the field and placed the right and left of the L5-S1 disc. Malleable retractors were used first. Inferior exposure. C-arm again was brought onto the field to confirm that this was the L5-S1 disc. Disc surgery will was then accomplished by Dr. Vertell Limber which be dictated as a separate note.  Following this the retractors were removed and were positioned at the level of the L5-S1 disc. Again the reverse lip of legs were positioned the right and left of the disc and the malleable were used for superior and inferior exposure and again C-arm was used to confirm the level of the disc. After disc replacement by Dr. Vertell Limber re-scrubbed. The retractors were removed. No bleeding was noted. The wound irrigated with saline. The paramedian fascia was closed with a number of 0 running Prolene suture. The remainder the closure was then done by Dr. Vertell Limber. Following this additional levels of disc surgery will be undertaken and also will be dictated separately by Dr. Vertell Limber   PATIENT DISPOSITION:  PACU - hemodynamically stable    Curt Jews, M.D. 05/09/2014 4:21 PM

## 2014-05-09 NOTE — OR Nursing (Signed)
Radiologist Dr. Carlis Abbott confirmed no instruments seen on final x ray.

## 2014-05-09 NOTE — H&P (Signed)
Patient ID:   [id number] Patient: Rhonda Clayton  Date of Birth: 09-12-1945 Visit Type: Office Visit   Date: 04/30/2014 10:30 AM Provider: Marchia Meiers. Vertell Limber MD   This 69 year old female presents for back pain.  History of Present Illness: 1.  back pain  The patient returns today to review her imaging studies and to review the specific details of planned surgical procedure.  This is a 2 part surgery and the first part will consist of anterior lumbar interbody fusion L5-S1 and L4 L5 levels.  We went over the specific surgical risks related to her obesity and 2 her overhanging pannus.  We also went over the specific approach related issues with regard to her vascularity.  The plan will be to perform X LIF as well at the L2-3 and L3 L4 levels and possibly L1-2 as well depending on access issues from the right.  The second stage will consist of T10 through ilium fusion with decompression and discectomy the L34 level.  The patient has been fitted for TLSO brace.  She has been given prescriptions for hydromorphone.  I answered her and her family's questions in great detail.  We went over surgical models and discussed the details of the pending surgical procedure.  She is scheduled for clearance from cardiac standpoint prior to surgery.      Medical/Surgical/Interim History Reviewed, no change.  Last detailed document date:02/21/2014.   PAST MEDICAL HISTORY, SURGICAL HISTORY, FAMILY HISTORY, SOCIAL HISTORY AND REVIEW OF SYSTEMS I have reviewed the patient's past medical, surgical, family and social history as well as the comprehensive review of systems as included on the [facility] history form dated 02/21/2014, which I have signed.  Family History: Reviewed, no changes.  Last detailed document: 02/21/2014.   Social History: Tobacco use reviewed. Reviewed, no changes. Last detailed document date: 02/21/2014.      MEDICATIONS(added, continued or stopped this  visit): Started Medication Directions Instruction Stopped   Advil 200 mg tablet take 3 tablets by oral route 4 times every day    04/11/2014 Dilaudid 2 mg tablet take 1 tablet by oral route  every 4 - 6 hours as needed  04/30/2014  04/30/2014 Dilaudid 2 mg tablet take 1 tablet by oral route  every 4 - 6 hours as needed     ESTRACE insert by vaginal route  every 4-5 days     gabapentin take 1 capsule by oral route  every bedtime     Glucovance take 1 tablet by oral route 2 times every day with meals     hydrochlorothiazide 12.5 mg tablet take 1 tablet by oral route  every day     ramipril 10 mg capsule take 1 capsule by oral route  every day     Synthroid 100 mcg tablet take 1 tablet by oral route  every day       ALLERGIES: Ingredient Reaction Medication Name Comment  CIPROFLOXACIN Unknown     Reviewed, no changes.    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  Completed Orders (this encounter) Order Details Reason Side Interpretation Result Initial Treatment Date Region  Lifestyle education regarding diet Encouraged to eat a well balanced diet and follow up with primary care physician.        Hypertension education Follow up with primary  care physician.         Assessment/Plan # Detail Type Description   1. Assessment Other idiopathic scoliosis, lumbar region (M41.26).       2. Assessment Scoliosis (and kyphoscoliosis), idiopathic (M41.20).       3. Assessment Radiculopathy, lumbar region (M54.16).       4. Assessment Disc displacement, lumbar (M51.26).       5. Assessment Spondylolisthesis, lumbar region (M43.16).       6. Assessment Body mass index (BMI) 30.0-30.9, adult (Z68.30).   Plan Orders Today's instructions / counseling include(s) Lifestyle education regarding diet.       7. Assessment Essential (primary) hypertension  (I10).         Pain Assessment/Treatment Pain Scale: 3/10. Method: Numeric Pain Intensity Scale. Location: back/leg. Onset: 11/21/2013. Duration: varies. Quality: discomforting. Pain Assessment/Treatment follow-up plan of care: Patient is taking medications as prescribed..  Fall Risk Plan The patient has not fallen in the last year.  Plan is two stage thoracolumbar decompression and fusion surgery  Orders: Instruction(s)/Education: Assessment Instruction  I10 Hypertension education  Z68.30 Lifestyle education regarding diet    MEDICATIONS PRESCRIBED TODAY    Rx Quantity Refills  DILAUDID 2 mg  60 0            Provider:  Marchia Meiers. Vertell Limber MD  05/05/2014 03:08 PM Dictation edited by: Marchia Meiers. Vertell Limber    CC Providers: Erline Levine MD [address] [place], [place] [address]              Electronically signed by Marchia Meiers. Vertell Limber MD on 05/05/2014 03:08 PM

## 2014-05-09 NOTE — Brief Op Note (Signed)
05/09/2014  6:39 PM  PATIENT:  Rhonda Clayton  69 y.o. female  PRE-OPERATIVE DIAGNOSIS:  Idiopathic scoliosis of lumbar region; Low back pain, unspecified; Radiculopathy, Lumbar region, spondylolisthesis, spondylolysis, stenosis  POST-OPERATIVE DIAGNOSIS:  Idiopathic scoliosis of lumbar region; Low back pain, unspecified; Radiculopathy, Lumbar region, spondylolisthesis, spondylolysis, stenosis   PROCEDURE:  Procedure(s): Stage 1:Lumbar four-five, Lumbar five-Sacral one Anterior lumbar interbody fusion with Dr. Donnetta Hutching for approach  (N/A) Lumbar three-four, Lumbar four-five Anterior lateral lumbar fusion (Right) with PEEK cages, anterior lumbar fixation ABDOMINAL EXPOSURE (N/A)  SURGEON:  Surgeon(s) and Role: Panel 1:    * Erline Levine, MD - Primary    * Ashok Pall, MD - Assisting  Panel 2:    * Rosetta Posner, MD - Primary  PHYSICIAN ASSISTANT:   ASSISTANTS: Poteat, RN   ANESTHESIA:   general  EBL:  Total I/O In: 45 [I.V.:3200] Out: 44 [Urine:500; Blood:200]  BLOOD ADMINISTERED:none  DRAINS: none   LOCAL MEDICATIONS USED:  LIDOCAINE   SPECIMEN:  No Specimen  DISPOSITION OF SPECIMEN:  N/A  COUNTS:  YES  TOURNIQUET:  * No tourniquets in log *  DICTATION: INDICATIONS:  Pateint is 69 year old female with chronic and intractable back and bilateral lower extremity pain with marked disc degeneration and scoliosis, with spondylolisthesis and spondylolysis with  Stenosis throughout the thoracolumbar spine.  This is a planned two stage surgery, the first part consists of ALIF L 45 and L 5 S1 levels and anterolateral decompression and fusion L 34 and L 23 levels. The second stage will involve posterior decompression and fusion from T 10 through the ileum. It was elected to take her to surgery for anterior lumbar decompression and fusion at the L 5 S1 level.  PROCEDURE:  Doctor Early  performed exposure and his portion of the procedure will be dictated separately.  Upon  exposing the L 5 S1 level, a localizing X ray was obtained with the C arm.  I then incised the anterior annulus and performed a thorough discectomy.  The endplates were cleared of disc and cartilagenous material and a thorough discectomy was performed with decompression of the ventral annulus and disc material.  After trial, a 12 degree lordotic x 24 x mm Nuvasive PEEK spacer was selected, packed with mediumBMP and Vitoss which was reconstituted with marrow rich blood aspirated from the L 5 vertebral body.  The implant was tamped into position and positioning was confirmed with C arm. 4.5 x 22.5 mm screws were placed through the implant into the vertebrae, one at L 5 and one at S1.  Locking mechanisms were engaged, soft tissues were inspected and found to be in good repair.  Retractors were then repositioned by Dr. Donnetta Hutching and upon exposing the L 4  L 5 level, a localizing X ray was obtained with the C arm.  I then incised the anterior annulus and performed a thorough discectomy.  The endplates were cleared of disc and cartilagenous material and a thorough discectomy was performed with decompression of the ventral annulus and disc material. There was considerable anterolisthesis which was reduced with sequential trials and clearing of the interspace.  After trial, a 20 degree lordotic 6 x 24 x 34 mm Nuvasive PEEK spacer was selected, packed with mediumBMP and Vitoss which was reconstituted with marrow rich blood aspirated from the L 5 vertebral body.  The implant was tamped into position and positioning was confirmed with C arm. 4.5 x 22.5 mm screws were placed through the implant  into the vertebrae, one at L 4 and one at L 5.  Locking mechanisms were engaged, soft tissues were inspected and found to be in good repair. .  Fascia was closed with 0 vicryl running stitch, skin edges closed with 2-0 and 3-0 vicryl sutures.  Wound was dressed with a sterile occlusive dressing.  Patient was extubated in the OR and taken to  recovery having tolerated her surgery well.  Counts were correct.  The patient was then repositioned on her left side in a lateral decubitus position on the operative table and using orthogonally projected C-arm fluoroscopy the patient was placed so that the L2-3 L3-4  levels were visualized in AP and lateral plane. The patient was then taped into position. The table was flexed so as to expose the L 34t. Skin was marked along with a posterior finger dissection incision. His flank was then prepped and draped in usual sterile fashion and incisions were made sequentially at L3-4 and L2-3 levels. Posterior finger dissection was made to enter the retroperitoneal space and then subsequently the probe was inserted into the psoas muscle from the right side initially at the L34 level. After mapping the neural elements were able to dock the probe per the midpoint of this vertebral level and without indications electrically of too close proximity to the neural tissues. Subsequently the self-retaining tractor was.after sequential dilators were utilized the shim was employed and the interspace was cleared of psoas muscle and then incised. A thorough discectomy was performed. Instruments were used to clear the interspace of disc material. After thorough discectomy was performed and this was performed using AP and lateral fluoroscopy an 8 lordotic by 50x 22 mm implant was packed with BMP and Vitoss with autologous blood. This was tamped into position using the slides and its position was confirmed on AP and lateral fluoroscopy. The considerable alteral listhesis of L 3 on L 4 was reduced with this cage placement.  Subsequently exposure was performed at the L 23 level and similar dissection was performed with locking of the self-retaining retractor. At this level were able to place a 10 lordotic by 22 x 45 mm implant packed in a similar fashion.  Hemostasis was assured the wounds were irrigated interrupted Vicryl sutures. Sterile  occlusive dressing was placed with Dermabond. The patient was then extubated in the operating room and taken to recovery in stable and satisfactory condition having tolerated his operation well. Counts were correct at the end of the case.    PLAN OF CARE: Admit to inpatient   PATIENT DISPOSITION:  PACU - hemodynamically stable.   Delay start of Pharmacological VTE agent (>24hrs) due to surgical blood loss or risk of bleeding: yes

## 2014-05-09 NOTE — Transfer of Care (Signed)
Immediate Anesthesia Transfer of Care Note  Patient: Rhonda Clayton  Procedure(s) Performed: Procedure(s): Stage 1:Lumbar four-five, Lumbar five-Sacral one Anterior lumbar interbody fusion with Dr. Donnetta Hutching for approach  (N/A) Lumbar three-four, Lumbar four-five Anterior lateral lumbar fusion (Right) ABDOMINAL EXPOSURE (N/A)  Patient Location: PACU  Anesthesia Type:General  Level of Consciousness: lethargic and responds to stimulation  Airway & Oxygen Therapy: Patient Spontanous Breathing and Patient connected to nasal cannula oxygen  Post-op Assessment: Report given to RN and Patient moving all extremities X 4  Post vital signs: Reviewed and stable  Last Vitals:  Filed Vitals:   05/09/14 0749  BP: 146/53  Pulse: 82  Temp: 36.4 C  Resp: 20    Complications: No apparent anesthesia complications

## 2014-05-09 NOTE — Anesthesia Postprocedure Evaluation (Signed)
  Anesthesia Post-op Note  Patient: Rhonda Clayton  Procedure(s) Performed: Procedure(s): Stage 1:Lumbar four-five, Lumbar five-Sacral one Anterior lumbar interbody fusion with Dr. Donnetta Hutching for approach  (N/A) Lumbar three-four, Lumbar four-five Anterior lateral lumbar fusion (Right) ABDOMINAL EXPOSURE (N/A)  Patient Location: PACU  Anesthesia Type:General  Level of Consciousness: awake, alert , oriented and patient cooperative  Airway and Oxygen Therapy: Patient Spontanous Breathing and Patient connected to nasal cannula oxygen  Post-op Pain: mild  Post-op Assessment: Post-op Vital signs reviewed, Patient's Cardiovascular Status Stable, Respiratory Function Stable, Patent Airway, No signs of Nausea or vomiting and Pain level controlled  Post-op Vital Signs: Reviewed and stable  Last Vitals:  Filed Vitals:   05/09/14 2045  BP:   Pulse: 85  Temp:   Resp: 10    Complications: No apparent anesthesia complications

## 2014-05-09 NOTE — Anesthesia Procedure Notes (Signed)
Procedure Name: Intubation Date/Time: 05/09/2014 11:40 AM Performed by: Sampson Si E Pre-anesthesia Checklist: Patient identified, Emergency Drugs available, Suction available, Patient being monitored and Timeout performed Patient Re-evaluated:Patient Re-evaluated prior to inductionOxygen Delivery Method: Circle system utilized Preoxygenation: Pre-oxygenation with 100% oxygen Intubation Type: IV induction Ventilation: Mask ventilation without difficulty Laryngoscope Size: Mac and 3 Grade View: Grade I Tube type: Oral Tube size: 7.0 mm Number of attempts: 1 Airway Equipment and Method: Stylet Placement Confirmation: ETT inserted through vocal cords under direct vision,  positive ETCO2 and breath sounds checked- equal and bilateral Secured at: 21 cm Tube secured with: Tape Dental Injury: Teeth and Oropharynx as per pre-operative assessment

## 2014-05-10 ENCOUNTER — Encounter (HOSPITAL_COMMUNITY)
Admission: RE | Disposition: A | Discharge status: Skilled Nursing Facility | Payer: PPO | Source: Ambulatory Visit | Attending: Neurosurgery

## 2014-05-10 ENCOUNTER — Inpatient Hospital Stay (HOSPITAL_COMMUNITY): Payer: PPO | Admitting: Certified Registered Nurse Anesthetist

## 2014-05-10 ENCOUNTER — Inpatient Hospital Stay (HOSPITAL_COMMUNITY): Payer: PPO

## 2014-05-10 HISTORY — PX: LUMBAR PERCUTANEOUS PEDICLE SCREW 3 LEVEL: SHX5562

## 2014-05-10 LAB — GLUCOSE, CAPILLARY
GLUCOSE-CAPILLARY: 262 mg/dL — AB (ref 70–99)
Glucose-Capillary: 255 mg/dL — ABNORMAL HIGH (ref 70–99)
Glucose-Capillary: 191 mg/dL — ABNORMAL HIGH (ref 70–99)

## 2014-05-10 LAB — PREPARE RBC (CROSSMATCH)

## 2014-05-10 SURGERY — LUMBAR PERCUTANEOUS PEDICLE SCREW 3 LEVEL
Anesthesia: General | Site: Back

## 2014-05-10 MED ORDER — MEPERIDINE HCL 25 MG/ML IJ SOLN: 6.2500 mg | INTRAMUSCULAR | Status: DC | PRN

## 2014-05-10 MED ORDER — ACETAMINOPHEN 325 MG PO TABS: 650.0000 mg | ORAL_TABLET | ORAL | Status: DC | PRN

## 2014-05-10 MED ORDER — EPHEDRINE SULFATE 50 MG/ML IJ SOLN
INTRAMUSCULAR | Status: DC | PRN
  Administered 2014-05-10: 5 mg via INTRAVENOUS
  Administered 2014-05-10: 10 mg via INTRAVENOUS
  Administered 2014-05-10: 5 mg via INTRAVENOUS
  Administered 2014-05-10: 10 mg via INTRAVENOUS

## 2014-05-10 MED ORDER — ONDANSETRON HCL 4 MG/2ML IJ SOLN
INTRAMUSCULAR | Status: DC | PRN
  Administered 2014-05-10 (×2): 4 mg via INTRAVENOUS

## 2014-05-10 MED ORDER — DEXTROSE 5 % IV SOLN
10.0000 mg | INTRAVENOUS | Status: DC | PRN
  Administered 2014-05-10: 10 ug/min via INTRAVENOUS

## 2014-05-10 MED ORDER — LIDOCAINE-EPINEPHRINE 1 %-1:100000 IJ SOLN
INTRAMUSCULAR | Status: DC | PRN
  Administered 2014-05-10: 10 mL

## 2014-05-10 MED ORDER — PHENYLEPHRINE HCL 10 MG/ML IJ SOLN
INTRAMUSCULAR | Status: DC | PRN
  Administered 2014-05-10: 40 ug via INTRAVENOUS
  Administered 2014-05-10 (×3): 80 ug via INTRAVENOUS
  Administered 2014-05-10: 40 ug via INTRAVENOUS
  Administered 2014-05-10: 80 ug via INTRAVENOUS
  Administered 2014-05-10: 40 ug via INTRAVENOUS
  Administered 2014-05-10 (×2): 80 ug via INTRAVENOUS

## 2014-05-10 MED ORDER — LACTATED RINGERS IV SOLN
INTRAVENOUS | Status: DC | PRN
  Administered 2014-05-10 (×2): via INTRAVENOUS

## 2014-05-10 MED ORDER — FENTANYL CITRATE (PF) 250 MCG/5ML IJ SOLN
INTRAMUSCULAR | Status: AC
  Filled 2014-05-10: qty 5

## 2014-05-10 MED ORDER — PROMETHAZINE HCL 25 MG/ML IJ SOLN
6.2500 mg | INTRAMUSCULAR | Status: DC | PRN
Start: 2014-05-10 — End: 2014-05-10

## 2014-05-10 MED ORDER — VANCOMYCIN HCL IN DEXTROSE 1-5 GM/200ML-% IV SOLN
1000.0000 mg | INTRAVENOUS | Status: DC
  Filled 2014-05-10: qty 200

## 2014-05-10 MED ORDER — 0.9 % SODIUM CHLORIDE (POUR BTL) OPTIME
TOPICAL | Status: DC | PRN
  Administered 2014-05-10: 1000 mL

## 2014-05-10 MED ORDER — BUPIVACAINE HCL (PF) 0.5 % IJ SOLN
INTRAMUSCULAR | Status: DC | PRN
  Administered 2014-05-10: 10 mL

## 2014-05-10 MED ORDER — DEXAMETHASONE SODIUM PHOSPHATE 4 MG/ML IJ SOLN
INTRAMUSCULAR | Status: AC
  Filled 2014-05-10: qty 1

## 2014-05-10 MED ORDER — HYDROMORPHONE HCL 1 MG/ML IJ SOLN
0.5000 mg | INTRAMUSCULAR | Status: DC | PRN
  Administered 2014-05-11 – 2014-05-14 (×3): 1 mg via INTRAVENOUS
  Filled 2014-05-10 (×3): qty 1

## 2014-05-10 MED ORDER — ROCURONIUM BROMIDE 50 MG/5ML IV SOLN
INTRAVENOUS | Status: AC
  Filled 2014-05-10: qty 1

## 2014-05-10 MED ORDER — HYDROMORPHONE HCL 1 MG/ML IJ SOLN: 0.2500 mg | INTRAMUSCULAR | Status: DC | PRN

## 2014-05-10 MED ORDER — PROPOFOL 10 MG/ML IV BOLUS
INTRAVENOUS | Status: AC
Start: 2014-05-10 — End: 2014-05-10
  Filled 2014-05-10: qty 20

## 2014-05-10 MED ORDER — HYDROMORPHONE HCL 1 MG/ML IJ SOLN
0.5000 mg | INTRAMUSCULAR | Status: DC | PRN
  Administered 2014-05-10 (×3): 0.5 mg via INTRAVENOUS

## 2014-05-10 MED ORDER — METHOCARBAMOL 1000 MG/10ML IJ SOLN
500.0000 mg | Freq: Four times a day (QID) | INTRAVENOUS | Status: DC | PRN
  Filled 2014-05-10: qty 5

## 2014-05-10 MED ORDER — DEXAMETHASONE SODIUM PHOSPHATE 4 MG/ML IJ SOLN
INTRAMUSCULAR | Status: DC | PRN
  Administered 2014-05-10: 4 mg via INTRAVENOUS

## 2014-05-10 MED ORDER — METHOCARBAMOL 500 MG PO TABS
500.0000 mg | ORAL_TABLET | Freq: Four times a day (QID) | ORAL | Status: DC | PRN
  Administered 2014-05-11: 500 mg via ORAL
  Filled 2014-05-10 (×2): qty 1

## 2014-05-10 MED ORDER — SODIUM CHLORIDE 0.9 % IV SOLN: Freq: Once | INTRAVENOUS | Status: DC

## 2014-05-10 MED ORDER — MIDAZOLAM HCL 2 MG/2ML IJ SOLN
INTRAMUSCULAR | Status: AC
  Filled 2014-05-10: qty 2

## 2014-05-10 MED ORDER — LIDOCAINE HCL (CARDIAC) 20 MG/ML IV SOLN
INTRAVENOUS | Status: DC | PRN
  Administered 2014-05-10: 45 mg via INTRAVENOUS

## 2014-05-10 MED ORDER — KCL IN DEXTROSE-NACL 20-5-0.45 MEQ/L-%-% IV SOLN
INTRAVENOUS | Status: DC
  Administered 2014-05-10 – 2014-05-11 (×2): via INTRAVENOUS
  Filled 2014-05-10 (×2): qty 1000

## 2014-05-10 MED ORDER — HYDROMORPHONE HCL 1 MG/ML IJ SOLN
INTRAMUSCULAR | Status: AC
  Administered 2014-05-10: 0.5 mg via INTRAVENOUS
  Filled 2014-05-10: qty 2

## 2014-05-10 MED ORDER — FENTANYL CITRATE (PF) 250 MCG/5ML IJ SOLN
INTRAMUSCULAR | Status: AC
Start: 2014-05-10 — End: 2014-05-10
  Filled 2014-05-10: qty 5

## 2014-05-10 MED ORDER — SODIUM CHLORIDE 0.9 % IV SOLN: 250.0000 mL | INTRAVENOUS | Status: DC

## 2014-05-10 MED ORDER — INSULIN ASPART 100 UNIT/ML ~~LOC~~ SOLN
SUBCUTANEOUS | Status: AC
  Filled 2014-05-10: qty 1

## 2014-05-10 MED ORDER — PANTOPRAZOLE SODIUM 40 MG IV SOLR
40.0000 mg | Freq: Every day | INTRAVENOUS | Status: DC
  Administered 2014-05-10 – 2014-05-11 (×2): 40 mg via INTRAVENOUS
  Filled 2014-05-10 (×2): qty 40

## 2014-05-10 MED ORDER — VANCOMYCIN HCL 10 G IV SOLR
1250.0000 mg | INTRAVENOUS | Status: DC
  Administered 2014-05-11 – 2014-05-15 (×5): 1250 mg via INTRAVENOUS
  Filled 2014-05-10 (×5): qty 1250

## 2014-05-10 MED ORDER — OXYCODONE-ACETAMINOPHEN 5-325 MG PO TABS
1.0000 | ORAL_TABLET | ORAL | Status: DC | PRN
  Administered 2014-05-10 – 2014-05-15 (×7): 2 via ORAL
  Filled 2014-05-10 (×4): qty 2

## 2014-05-10 MED ORDER — MENTHOL 3 MG MT LOZG: 1.0000 | LOZENGE | OROMUCOSAL | Status: DC | PRN

## 2014-05-10 MED ORDER — KETOROLAC TROMETHAMINE 15 MG/ML IJ SOLN
INTRAMUSCULAR | Status: AC
  Administered 2014-05-10: 15 mg
  Filled 2014-05-10: qty 1

## 2014-05-10 MED ORDER — KETOROLAC TROMETHAMINE 30 MG/ML IJ SOLN
15.0000 mg | Freq: Once | INTRAMUSCULAR | Status: DC | PRN
  Filled 2014-05-10: qty 1

## 2014-05-10 MED ORDER — SODIUM CHLORIDE 0.9 % IJ SOLN
INTRAMUSCULAR | Status: DC | PRN
  Administered 2014-05-10 (×2): 10 mL via INTRAVENOUS

## 2014-05-10 MED ORDER — SODIUM CHLORIDE 0.9 % IJ SOLN: 3.0000 mL | INTRAMUSCULAR | Status: DC | PRN

## 2014-05-10 MED ORDER — ONDANSETRON HCL 4 MG/2ML IJ SOLN
INTRAMUSCULAR | Status: AC
  Filled 2014-05-10: qty 4

## 2014-05-10 MED ORDER — PHENYLEPHRINE 40 MCG/ML (10ML) SYRINGE FOR IV PUSH (FOR BLOOD PRESSURE SUPPORT)
PREFILLED_SYRINGE | INTRAVENOUS | Status: AC
Start: 2014-05-10 — End: 2014-05-10
  Filled 2014-05-10: qty 20

## 2014-05-10 MED ORDER — PHENYLEPHRINE 40 MCG/ML (10ML) SYRINGE FOR IV PUSH (FOR BLOOD PRESSURE SUPPORT)
PREFILLED_SYRINGE | INTRAVENOUS | Status: AC
  Filled 2014-05-10: qty 10

## 2014-05-10 MED ORDER — EPHEDRINE SULFATE 50 MG/ML IJ SOLN
INTRAMUSCULAR | Status: AC
Start: 2014-05-10 — End: 2014-05-10
  Filled 2014-05-10: qty 1

## 2014-05-10 MED ORDER — ROCURONIUM BROMIDE 100 MG/10ML IV SOLN
INTRAVENOUS | Status: DC | PRN
  Administered 2014-05-10: 10 mg via INTRAVENOUS
  Administered 2014-05-10: 20 mg via INTRAVENOUS
  Administered 2014-05-10: 10 mg via INTRAVENOUS
  Administered 2014-05-10: 30 mg via INTRAVENOUS
  Administered 2014-05-10: 20 mg via INTRAVENOUS
  Administered 2014-05-10 (×2): 10 mg via INTRAVENOUS

## 2014-05-10 MED ORDER — SODIUM CHLORIDE 0.9 % IJ SOLN
INTRAMUSCULAR | Status: AC
  Filled 2014-05-10: qty 10

## 2014-05-10 MED ORDER — PHENOL 1.4 % MT LIQD: 1.0000 | OROMUCOSAL | Status: DC | PRN

## 2014-05-10 MED ORDER — BUPIVACAINE LIPOSOME 1.3 % IJ SUSP
INTRAMUSCULAR | Status: DC | PRN
  Administered 2014-05-10: 20 mL

## 2014-05-10 MED ORDER — FLEET ENEMA 7-19 GM/118ML RE ENEM: 1.0000 | ENEMA | Freq: Once | RECTAL | Status: AC | PRN

## 2014-05-10 MED ORDER — BUPIVACAINE LIPOSOME 1.3 % IJ SUSP
20.0000 mL | INTRAMUSCULAR | Status: AC
  Filled 2014-05-10: qty 20

## 2014-05-10 MED ORDER — ALUM & MAG HYDROXIDE-SIMETH 200-200-20 MG/5ML PO SUSP: 30.0000 mL | Freq: Four times a day (QID) | ORAL | Status: DC | PRN

## 2014-05-10 MED ORDER — NEOSTIGMINE METHYLSULFATE 10 MG/10ML IV SOLN
INTRAVENOUS | Status: DC | PRN
  Administered 2014-05-10: 3 mg via INTRAVENOUS

## 2014-05-10 MED ORDER — FENTANYL CITRATE (PF) 100 MCG/2ML IJ SOLN
INTRAMUSCULAR | Status: DC | PRN
  Administered 2014-05-10 (×2): 50 ug via INTRAVENOUS
  Administered 2014-05-10: 25 ug via INTRAVENOUS
  Administered 2014-05-10: 50 ug via INTRAVENOUS
  Administered 2014-05-10: 25 ug via INTRAVENOUS
  Administered 2014-05-10: 50 ug via INTRAVENOUS
  Administered 2014-05-10 (×2): 25 ug via INTRAVENOUS
  Administered 2014-05-10: 50 ug via INTRAVENOUS
  Administered 2014-05-10 (×4): 25 ug via INTRAVENOUS
  Administered 2014-05-10: 50 ug via INTRAVENOUS

## 2014-05-10 MED ORDER — GLYCOPYRROLATE 0.2 MG/ML IJ SOLN
INTRAMUSCULAR | Status: AC
Start: 2014-05-10 — End: 2014-05-10
  Filled 2014-05-10: qty 4

## 2014-05-10 MED ORDER — NEOSTIGMINE METHYLSULFATE 10 MG/10ML IV SOLN
INTRAVENOUS | Status: AC
  Filled 2014-05-10: qty 3

## 2014-05-10 MED ORDER — LACTATED RINGERS IV SOLN
INTRAVENOUS | Status: DC | PRN
  Administered 2014-05-10: 07:00:00 via INTRAVENOUS

## 2014-05-10 MED ORDER — PROPOFOL 10 MG/ML IV BOLUS
INTRAVENOUS | Status: DC | PRN
  Administered 2014-05-10: 130 mg via INTRAVENOUS

## 2014-05-10 MED ORDER — HYDROCODONE-ACETAMINOPHEN 5-325 MG PO TABS
1.0000 | ORAL_TABLET | ORAL | Status: DC | PRN
  Administered 2014-05-10 – 2014-05-15 (×5): 2 via ORAL
  Filled 2014-05-10 (×5): qty 2

## 2014-05-10 MED ORDER — GLYCOPYRROLATE 0.2 MG/ML IJ SOLN
INTRAMUSCULAR | Status: DC | PRN
  Administered 2014-05-10: .4 mg via INTRAVENOUS

## 2014-05-10 MED ORDER — ONDANSETRON HCL 4 MG/2ML IJ SOLN
4.0000 mg | INTRAMUSCULAR | Status: DC | PRN
  Administered 2014-05-14: 4 mg via INTRAVENOUS
  Filled 2014-05-10: qty 2

## 2014-05-10 MED ORDER — BISACODYL 10 MG RE SUPP
10.0000 mg | Freq: Every day | RECTAL | Status: DC | PRN
  Administered 2014-05-13: 10 mg via RECTAL
  Filled 2014-05-10: qty 1

## 2014-05-10 MED ORDER — SENNOSIDES-DOCUSATE SODIUM 8.6-50 MG PO TABS: 1.0000 | ORAL_TABLET | Freq: Every evening | ORAL | Status: DC | PRN

## 2014-05-10 MED ORDER — LIDOCAINE HCL (CARDIAC) 20 MG/ML IV SOLN
INTRAVENOUS | Status: AC
Start: 2014-05-10 — End: 2014-05-10
  Filled 2014-05-10: qty 5

## 2014-05-10 MED ORDER — ACETAMINOPHEN 650 MG RE SUPP: 650.0000 mg | RECTAL | Status: DC | PRN

## 2014-05-10 MED ORDER — VANCOMYCIN HCL 1000 MG IV SOLR
1000.0000 mg | INTRAVENOUS | Status: DC | PRN
  Administered 2014-05-10: 1000 mg via INTRAVENOUS

## 2014-05-10 MED ORDER — LIDOCAINE HCL (CARDIAC) 20 MG/ML IV SOLN
INTRAVENOUS | Status: AC
  Filled 2014-05-10: qty 5

## 2014-05-10 MED ORDER — SODIUM CHLORIDE 0.9 % IJ SOLN
3.0000 mL | Freq: Two times a day (BID) | INTRAMUSCULAR | Status: DC
  Administered 2014-05-10 – 2014-05-14 (×6): 3 mL via INTRAVENOUS

## 2014-05-10 MED ORDER — OXYCODONE-ACETAMINOPHEN 5-325 MG PO TABS
ORAL_TABLET | ORAL | Status: AC
  Administered 2014-05-10: 2 via ORAL
  Filled 2014-05-10: qty 2

## 2014-05-10 MED FILL — Sodium Chloride IV Soln 0.9%: INTRAVENOUS | Qty: 1000 | Status: AC

## 2014-05-10 MED FILL — Heparin Sodium (Porcine) Inj 1000 Unit/ML: INTRAMUSCULAR | Qty: 30 | Status: AC

## 2014-05-10 SURGICAL SUPPLY — 84 items
APL SKNCLS STERI-STRIP NONHPOA (GAUZE/BANDAGES/DRESSINGS) ×1
BENZOIN TINCTURE PRP APPL 2/3 (GAUZE/BANDAGES/DRESSINGS) ×2 IMPLANT
BONE CANC CHIPS 20CC PCAN1/4 (Bone Implant) ×4 IMPLANT
BUR MATCHSTICK NEURO 3.0 LAGG (BURR) ×1 IMPLANT
BUR ROUND FLUTED 5 RND (BURR) ×1 IMPLANT
CHIPS CANC BONE 20CC PCAN1/4 (Bone Implant) ×2 IMPLANT
CONNECTOR RELINE 20MM OPEN OFF (Connector) ×1 IMPLANT
CONNECTOR RELINE 30MM OPEN OFF (Connector) ×1 IMPLANT
CONNECTOR RELINE 60MM OPEN OFF (Connector) ×1 IMPLANT
CONT SPEC 4OZ CLIKSEAL STRL BL (MISCELLANEOUS) ×4 IMPLANT
COVER BACK TABLE 24X17X13 BIG (DRAPES) IMPLANT
COVER BACK TABLE 60X90IN (DRAPES) ×6 IMPLANT
DECANTER SPIKE VIAL GLASS SM (MISCELLANEOUS) ×2 IMPLANT
DIGITIZER BENDINI (MISCELLANEOUS) ×1 IMPLANT
DRAPE LAPAROTOMY 100X72X124 (DRAPES) ×2 IMPLANT
DRAPE POUCH INSTRU U-SHP 10X18 (DRAPES) ×2 IMPLANT
DRAPE SCAN PATIENT (DRAPES) ×2 IMPLANT
DRAPE SURG 17X23 STRL (DRAPES) ×2 IMPLANT
DRSG OPSITE 4X5.5 SM (GAUZE/BANDAGES/DRESSINGS) ×1 IMPLANT
DRSG OPSITE POSTOP 4X10 (GAUZE/BANDAGES/DRESSINGS) ×2 IMPLANT
DRSG TELFA 3X8 NADH (GAUZE/BANDAGES/DRESSINGS) IMPLANT
DURAPREP 26ML APPLICATOR (WOUND CARE) ×2 IMPLANT
ELECT REM PT RETURN 9FT ADLT (ELECTROSURGICAL) ×2
ELECTRODE REM PT RTRN 9FT ADLT (ELECTROSURGICAL) ×1 IMPLANT
GAUZE SPONGE 4X4 12PLY STRL (GAUZE/BANDAGES/DRESSINGS) ×2 IMPLANT
GAUZE SPONGE 4X4 16PLY XRAY LF (GAUZE/BANDAGES/DRESSINGS) IMPLANT
GLOVE BIO SURGEON STRL SZ8 (GLOVE) ×5 IMPLANT
GLOVE BIOGEL PI IND STRL 8 (GLOVE) ×1 IMPLANT
GLOVE BIOGEL PI IND STRL 8.5 (GLOVE) ×2 IMPLANT
GLOVE BIOGEL PI INDICATOR 8 (GLOVE) ×3
GLOVE BIOGEL PI INDICATOR 8.5 (GLOVE) ×3
GLOVE ECLIPSE 8.0 STRL XLNG CF (GLOVE) ×4 IMPLANT
GLOVE EXAM NITRILE LRG STRL (GLOVE) IMPLANT
GLOVE EXAM NITRILE MD LF STRL (GLOVE) IMPLANT
GLOVE EXAM NITRILE XL STR (GLOVE) IMPLANT
GLOVE EXAM NITRILE XS STR PU (GLOVE) IMPLANT
GOWN STRL REUS W/ TWL LRG LVL3 (GOWN DISPOSABLE) IMPLANT
GOWN STRL REUS W/ TWL XL LVL3 (GOWN DISPOSABLE) ×1 IMPLANT
GOWN STRL REUS W/TWL 2XL LVL3 (GOWN DISPOSABLE) ×3 IMPLANT
GOWN STRL REUS W/TWL LRG LVL3 (GOWN DISPOSABLE)
GOWN STRL REUS W/TWL XL LVL3 (GOWN DISPOSABLE) ×8
GRAFT BNE CANC CHIPS 1-8 20CC (Bone Implant) IMPLANT
KIT BASIN OR (CUSTOM PROCEDURE TRAY) ×2 IMPLANT
KIT INFUSE MEDIUM (Orthopedic Implant) ×1 IMPLANT
KIT POSITION SURG JACKSON T1 (MISCELLANEOUS) ×2 IMPLANT
KIT ROOM TURNOVER OR (KITS) ×2 IMPLANT
MARKER SKIN DUAL TIP RULER LAB (MISCELLANEOUS) ×2 IMPLANT
MARKER SPHERE PSV REFLC 13MM (MARKER) ×10 IMPLANT
MILL MEDIUM DISP (BLADE) ×1 IMPLANT
NDL HYPO 21X1.5 SAFETY (NEEDLE) IMPLANT
NDL HYPO 25X1 1.5 SAFETY (NEEDLE) ×1 IMPLANT
NEEDLE HYPO 21X1.5 SAFETY (NEEDLE) ×2 IMPLANT
NEEDLE HYPO 25X1 1.5 SAFETY (NEEDLE) ×2 IMPLANT
NS IRRIG 1000ML POUR BTL (IV SOLUTION) ×3 IMPLANT
PACK FOAM VITOSS 10CC (Orthopedic Implant) ×2 IMPLANT
PACK LAMINECTOMY NEURO (CUSTOM PROCEDURE TRAY) ×2 IMPLANT
PAD ARMBOARD 7.5X6 YLW CONV (MISCELLANEOUS) ×6 IMPLANT
PAD DRESSING TELFA 3X8 NADH (GAUZE/BANDAGES/DRESSINGS) ×1 IMPLANT
PATTIES SURGICAL .5 X.5 (GAUZE/BANDAGES/DRESSINGS) IMPLANT
PATTIES SURGICAL .5 X1 (DISPOSABLE) IMPLANT
PATTIES SURGICAL 1X1 (DISPOSABLE) IMPLANT
ROD RELINE-O 5.5X300 STRT NS (Rod) IMPLANT
ROD RELINE-O 5.5X300MM STRT (Rod) ×4 IMPLANT
SCREW LOCK RELINE CADDY 5.5MM (Screw) ×17 IMPLANT
SCREW LOCK RELINE CLOSED TULIP (Screw) ×2 IMPLANT
SCREW RELINE POLY 4.5X40MM (Screw) ×12 IMPLANT
SCREW RELINE-O 4.5X45MM POLY (Screw) ×3 IMPLANT
SCREW RELINE-O 8.5X60MM CLOSED (Screw) ×2 IMPLANT
SCREW RELINE-O POLY 6.5X45 (Screw) ×3 IMPLANT
SCREW RELINE-O POLY 6.5X50MM (Screw) ×3 IMPLANT
SCREW RLINE PLY 2S 40X4.5XPA (Screw) IMPLANT
SPONGE LAP 4X18 X RAY DECT (DISPOSABLE) IMPLANT
STAPLER SKIN PROX WIDE 3.9 (STAPLE) IMPLANT
STRIP CLOSURE SKIN 1/2X4 (GAUZE/BANDAGES/DRESSINGS) ×1 IMPLANT
SUT VIC AB 1 CT1 18XBRD ANBCTR (SUTURE) ×2 IMPLANT
SUT VIC AB 1 CT1 8-18 (SUTURE) ×6
SUT VIC AB 2-0 CT1 18 (SUTURE) ×5 IMPLANT
SUT VIC AB 3-0 SH 8-18 (SUTURE) ×5 IMPLANT
SYR 20CC LL (SYRINGE) ×1 IMPLANT
SYR 20ML ECCENTRIC (SYRINGE) ×2 IMPLANT
SYR INSULIN 1ML 31GX6 SAFETY (SYRINGE) IMPLANT
TOWEL OR 17X24 6PK STRL BLUE (TOWEL DISPOSABLE) ×2 IMPLANT
TOWEL OR 17X26 10 PK STRL BLUE (TOWEL DISPOSABLE) ×2 IMPLANT
WATER STERILE IRR 1000ML POUR (IV SOLUTION) ×2 IMPLANT

## 2014-05-10 NOTE — Op Note (Signed)
05/09/2014 - 05/10/2014  2:33 PM  PATIENT:  Rhonda Clayton  69 y.o. female  PRE-OPERATIVE DIAGNOSIS:  Idiopathic Scoliosis of ThoracoLumbar Region, Low back pain, Unspecified radiculopathy lumbar region, herniated lumbar disc with spinal stenosis  POST-OPERATIVE DIAGNOSIS:  Idiopathic Scoliosis of ThoracoLumbar Region, Low back pain, Unspecified radiculopathy lumbar region, herniated lumbar disc with spinal stenosis   PROCEDURE:  Procedure(s) with comments: Stage 2: T 10 to Ileum pedicle screw fixation with  Laminectomy L3-4 and microdiscectomy with posterolateral arthrodesis with Autograft, allograft T 10 to pelvis   (N/A) - Stage 2: Percutaneous Pedicle Screws T10 to Ileum; Laminectomy 99991111   APPLICATION OF INTRAOPERATIVE CT SCAN (N/A) - APPLICATION OF INTRAOPERATIVE CT SCAN  SURGEON:  Surgeon(s) and Role:    * Erline Levine, MD - Primary  PHYSICIAN ASSISTANT:   ASSISTANTS: Poteat, RN   ANESTHESIA:   general  EBL:  Total I/O In: 2920 [I.V.:2250; Blood:670] Out: 880 [Urine:455; Blood:425]  BLOOD ADMINISTERED: 2 Units PRBCs  DRAINS: (Medium) Hemovact drain(s) in the epidural space with  Suction Open   LOCAL MEDICATIONS USED:  MARCAINE     SPECIMEN:  No Specimen  DISPOSITION OF SPECIMEN:  N/A  COUNTS:  YES  TOURNIQUET:  * No tourniquets in log *  DICTATION: Patient is 69 year old woman who has undergone Stage I ALIF and XLIF yesterday who now presents for Stage II decompression and fusion T 10 to Ileum.  It was elected to take her to surgery for fusion from T 11 through ilium.  She has severe scoliosis with Preoperative Pelvic Parameters of PI 65, LL 35, PI-LL +30.  Procedure: Patient was placed in a prone position on the [place] table attachment for the Aero intra-op CT/ navigation system after smooth and uncomplicated induction of general endotracheal anesthesia. His lback was shaved and prepped and draped in usual sterile fashion with betadine scrub and DuraPrep. Area of  incision was infiltrated with local lidocaine. Incision was made to the lumbodorsal fascia was incised and exposure was performed of the T 10 to the ilium bilaterally.  Intraoperative CT scanwas obtained which confirmed correct orientation with marker probes at T 10 through ilium levels.   There was good correction of scoliotic curvature. The posterolateral region was extensively decorticated and pedicle screws were placed at T 10  through ilium bilaterally using intraoperative navigation.  The patient had extremely small pedicles, especially in the lower thoracic region and at L 1, the peciles were too small to instrument at all.  45 x 4.5 mm pedicle screws were placed at T 10, T 11, T 12 , bilaterally and 45 x 4.5 mm screws placed  at L 2 left, 4.5 x 45 mm screws at L 3 bilaterally L 3, 6.5 x 50 at L4 bilaterally, 6.5 x 50 Left L 5, 6.5 x 45 Right L 5 and both S 1 levels.  8.5 x 60 mm closed head screws were placed at the Ileum bilaterally using navigation.  Offset connectors were affixed to these screw heads. An intra-operative CT spin was performed with visualization of all screws.  The right T 10 screw appeared to have a medial trajectory, so this was repositioned with a more lateral trajectory.    A total laminectomy of L 3 and L 4 was performed and the thecal sac was mobilized medially.  An annulotomy was performed with discectomy.  The XLIF cage was visualized through this annulotomy and there did not appear to be significant persistent nerve compression after this decompression and discectomy.  Bendini rod bending system was utilized and rods were bent, affixed to screw heads and locked down in situ.   Autograft was harvested from both ilia for use in fusion and the spinous processes and laminae at L 3 and L 4 levels. along with marrow rich blood. Rods were cut, bent and locked down bilaterally in situ and the posterolateral region was packed with autograft, large BMP, Vitoss soaked in marrow-rich blood and  40 cc of allograft after decorticating the posterolateral region. The wound was irrigated. A medium Hemovac drain was placed. Long-acting Marcaine was injected into the paraspinous musculature.  Fascia was closed with 1 Vicryl sutures skin edges were reapproximated 2 and 3-0 Vicryl sutures. The wound was dressed with Dermabond and an occlusive dressing.  the patient was extubated in the operating room and taken to recovery in stable satisfactory condition she tolerated the operation well counts were correct at the end of the case.  At the conclusion of the surgery, preoperative PI-LL had been corrected from + 30 to + 11.  PLAN OF CARE: Admit to inpatient   PATIENT DISPOSITION:  PACU - hemodynamically stable.   Delay start of Pharmacological VTE agent (>24hrs) due to surgical blood loss or risk of bleeding: yes

## 2014-05-10 NOTE — Progress Notes (Signed)
ANTIBIOTIC CONSULT NOTE - INITIAL  Pharmacy Consult for Vancomycin Indication: Post-op spinal surgery   Allergies  Allergen Reactions  . Coffee Flavor Other (See Comments)    Sick on stomach, sneezing, backed up  . Cephalexin Other (See Comments)    Yeast infection  . Ciprofloxacin Other (See Comments)    tendinitis  . Keflex [Cephalexin] Other (See Comments)    As long as taking Diflucan with it  . Oatmeal Other (See Comments)    Sneezing and drainage  . Sulfa Antibiotics Other (See Comments)    Yeast infection    Patient Measurements: Height: 5\' 1"  (154.9 cm) Weight: 166 lb (75.297 kg) IBW/kg (Calculated) : 47.8 Adjusted Body Weight:    Vital Signs: Temp: 98 F (36.7 C) (04/21 1600) BP: 110/59 mmHg (04/21 1545) Pulse Rate: 82 (04/21 1600) Intake/Output from previous day: 04/20 0701 - 04/21 0700 In: 4411.8 [I.V.:4211.8; IV Piggyback:200] Out: [id number] [Urine:1175; Blood:200] Intake/Output from this shift: Total I/O In: 2920 [I.V.:2250; Blood:670] Out: 880 [Urine:455; Blood:425]  Labs:  Recent Labs  05/09/14 0831  HGB 11.2*  CREATININE 1.10   Estimated Creatinine Clearance: 43.5 mL/min (by C-G formula based on Cr of 1.1). No results for input(s): VANCOTROUGH, VANCOPEAK, VANCORANDOM, GENTTROUGH, GENTPEAK, GENTRANDOM, TOBRATROUGH, TOBRAPEAK, TOBRARND, AMIKACINPEAK, AMIKACINTROU, AMIKACIN in the last 72 hours.   Microbiology: Recent Results (from the past 720 hour(s))  Surgical pcr screen     Status: None   Collection Time: 04/24/14  2:40 PM  Result Value Ref Range Status   MRSA, PCR NEGATIVE NEGATIVE Final   Staphylococcus aureus NEGATIVE NEGATIVE Final    Comment:        The Xpert SA Assay (FDA approved for NASAL specimens in patients over 47 years of age), is one component of a comprehensive surveillance program.  Test performance has been validated by [facility] for patients greater than or equal to 9 year old. It is not intended to diagnose  infection nor to guide or monitor treatment.     Medical History: Past Medical History  Diagnosis Date  . Esophageal stricture   . Essential hypertension   . Hyperlipidemia   . Hypothyroidism   . Osteopenia   . DJD (degenerative joint disease)   . Venous insufficiency   . Cystocele   . Rectocele   . Stress incontinence   . Torn rotator cuff     Bilateral  . Type 2 diabetes mellitus   . Anemia   . Lichen sclerosus   . RBBB with left anterior fascicular block     Medications:  Prescriptions prior to admission  Medication Sig Dispense Refill Last Dose  . amLODipine (NORVASC) 5 MG tablet Take 5 mg by mouth daily.   05/09/2014 at [place]  . Cholecalciferol (VITAMIN D PO) Take 1 capsule by mouth 2 (two) times daily.   Past Week at Unknown time  . clobetasol ointment (TEMOVATE) [id number] % Apply 1 application topically at bedtime.   Past Week at Unknown time  . gabapentin (NEURONTIN) 100 MG capsule Take 100 mg by mouth at bedtime.   05/08/2014 at Unknown time  . glyBURIDE-metformin (GLUCOVANCE) 5-500 MG per tablet Take 2 tablets by mouth 2 (two) times daily.   05/08/2014 at Unknown time  . hydrochlorothiazide (MICROZIDE) 12.5 MG capsule Take 12.5 mg by mouth daily.   05/08/2014 at Unknown time  . HYDROmorphone (DILAUDID) 2 MG tablet Take 2 mg by mouth every 6 (six) hours as needed for severe pain.   05/08/2014 at Unknown time  .  levothyroxine (SYNTHROID, LEVOTHROID) 100 MCG tablet Take 100 mcg by mouth daily before breakfast. Takes Synthroid name brand.   05/09/2014 at 0500  . levothyroxine (SYNTHROID, LEVOTHROID) 112 MCG tablet Take 100 mcg by mouth daily.    Taking  . NON FORMULARY Place 1 mL vaginally 2 (two) times a week. Estradiol 0.2mg  / ml   Taking  . OVER THE COUNTER MEDICATION Apply 1 application topically as needed (for skin). Emu Aid   Past Week at Unknown time  . ramipril (ALTACE) 10 MG tablet Take 10 mg by mouth daily.   05/08/2014 at Unknown time   Assessment: 69 y/o F s/p spinal  surgery: decompression and fusion. Currently afebrile. WBC 11.2. CrCl 43. Patient to continue Vancomycin until discontinued by MD due to the presence of a drain.  Goal of Therapy:  Vancomycin trough level 10-15 mcg/ml  Plan:  Vancomycin 1250mg  IV q24h. (last dose 1g 4/21 at 1305) Vanco trough after 3-5 doses at steady state if continued   Maureen Duesing S. Alford Highland, PharmD, BCPS Clinical Staff Pharmacist Pager [phone]  Eilene Ghazi Stillinger 05/10/2014,4:58 PM

## 2014-05-10 NOTE — Transfer of Care (Signed)
Immediate Anesthesia Transfer of Care Note  Patient: Rhonda Clayton  Procedure(s) Performed: Procedure(s) with comments: Stage 2: Percutaneous Pedicle Screws; Laminectomy L3-4   (N/A) - Stage 2: Percutaneous Pedicle Screws T10 to Ileum; Laminectomy 99991111   APPLICATION OF INTRAOPERATIVE CT SCAN (N/A) - APPLICATION OF INTRAOPERATIVE CT SCAN  Patient Location: PACU  Anesthesia Type:General  Level of Consciousness: awake, alert  and oriented  Airway & Oxygen Therapy: Patient Spontanous Breathing  Post-op Assessment: Report given to RN and Post -op Vital signs reviewed and stable  Post vital signs: Reviewed and stable  Last Vitals:  Filed Vitals:   05/10/14 0400  BP:   Pulse:   Temp: 36.3 C  Resp:     Complications: No apparent anesthesia complications

## 2014-05-10 NOTE — Progress Notes (Signed)
Awake, alert, conversant.  MAEW with good power.  Doing well.  

## 2014-05-10 NOTE — Plan of Care (Signed)
Problem: Consults Goal: Diagnosis - Spinal Surgery Outcome: Completed/Met Date Met:  05/10/14 Lumbar Laminectomy (Complex)

## 2014-05-10 NOTE — Anesthesia Preprocedure Evaluation (Addendum)
Anesthesia Evaluation  Patient identified by MRN, date of birth, ID band Patient awake    Reviewed: Allergy & Precautions, NPO status , Patient's Chart, lab work & pertinent test results  Airway Mallampati: II  TM Distance: >3 FB Neck ROM: full    Dental  (+) Teeth Intact, Dental Advisory Given   Pulmonary neg pulmonary ROS,  breath sounds clear to auscultation        Cardiovascular hypertension, + Peripheral Vascular Disease + dysrhythmias Rhythm:regular Rate:Normal  RBBB with LAFB   Neuro/Psych    GI/Hepatic   Endo/Other  diabetes, Type 2Hypothyroidism   Renal/GU      Musculoskeletal   Abdominal   Peds  Hematology   Anesthesia Other Findings   Reproductive/Obstetrics                             Anesthesia Physical  Anesthesia Plan  ASA: II  Anesthesia Plan: General   Post-op Pain Management:    Induction: Intravenous  Airway Management Planned: Oral ETT  Additional Equipment:   Intra-op Plan:   Post-operative Plan: Extubation in OR  Informed Consent: I have reviewed the patients History and Physical, chart, labs and discussed the procedure including the risks, benefits and alternatives for the proposed anesthesia with the patient or authorized representative who has indicated his/her understanding and acceptance.   Dental advisory given  Plan Discussed with: CRNA  Anesthesia Plan Comments:        Anesthesia Quick Evaluation

## 2014-05-10 NOTE — Progress Notes (Signed)
PT Cancellation Note  Patient Details Name: Rhonda Clayton MRN: [id number] DOB: [birth year]   Cancelled Treatment:    Reason Eval/Treat Not Completed: Medical issues which prohibited therapy (Pt in second surgery currently.  Will check on pt tomorrow. Thanks.)   Irwin Brakeman F 05/10/2014, 9:17 AM [facility] [phone] (pager)

## 2014-05-10 NOTE — Progress Notes (Signed)
ANTIBIOTIC CONSULT NOTE - INITIAL  Pharmacy Consult for Vancomcyin  Indication: surgical prophylaxis  Allergies  Allergen Reactions  . Coffee Flavor Other (See Comments)    Sick on stomach, sneezing, backed up  . Cephalexin Other (See Comments)    Yeast infection  . Ciprofloxacin Other (See Comments)    tendinitis  . Keflex [Cephalexin] Other (See Comments)    As long as taking Diflucan with it  . Oatmeal Other (See Comments)    Sneezing and drainage  . Sulfa Antibiotics Other (See Comments)    Yeast infection    Patient Measurements: Height: 5\' 1"  (154.9 cm) Weight: 166 lb (75.297 kg) IBW/kg (Calculated) : 47.8   Vital Signs: Temp: 96.5 F (35.8 C) (04/21 0000) Temp Source: Axillary (04/21 0000) BP: 113/65 mmHg (04/21 0000) Pulse Rate: 80 (04/21 0030) Intake/Output from previous day: 04/20 0701 - 04/21 0700 In: 3503 [I.V.:3503] Out: 875 [Urine:675; Blood:200] Intake/Output from this shift: Total I/O In: 303 [I.V.:303] Out: 175 [Urine:175]  Labs:  Recent Labs  05/09/14 0831  HGB 11.2*  CREATININE 1.10   Estimated Creatinine Clearance: 43.5 mL/min (by C-G formula based on Cr of 1.1). No results for input(s): VANCOTROUGH, VANCOPEAK, VANCORANDOM, GENTTROUGH, GENTPEAK, GENTRANDOM, TOBRATROUGH, TOBRAPEAK, TOBRARND, AMIKACINPEAK, AMIKACINTROU, AMIKACIN in the last 72 hours.   Microbiology: Recent Results (from the past 720 hour(s))  Surgical pcr screen     Status: None   Collection Time: 04/24/14  2:40 PM  Result Value Ref Range Status   MRSA, PCR NEGATIVE NEGATIVE Final   Staphylococcus aureus NEGATIVE NEGATIVE Final    Comment:        The Xpert SA Assay (FDA approved for NASAL specimens in patients over 65 years of age), is one component of a comprehensive surveillance program.  Test performance has been validated by [facility] for patients greater than or equal to 58 year old. It is not intended to diagnose infection nor to guide or monitor  treatment.     Medical History: Past Medical History  Diagnosis Date  . Esophageal stricture   . Essential hypertension   . Hyperlipidemia   . Hypothyroidism   . Osteopenia   . DJD (degenerative joint disease)   . Venous insufficiency   . Cystocele   . Rectocele   . Stress incontinence   . Torn rotator cuff     Bilateral  . Type 2 diabetes mellitus   . Anemia   . Lichen sclerosus   . RBBB with left anterior fascicular block     Assessment: 69 y.o female s/p lumbar fusion surgery. Pharmacy consulted to dose vancomycin for 1 dose 12 hours post-op unless patient has a drain, then continue vancomycin until discontinued by physician. Patient dose not have a drain. She received preop vancomycin 1000 mg IV at 11:27 on 05/09/14.  CrCl ~ 44 ml/min  Goal of Therapy:  Vancomycin trough level 10-15 mcg/ml  Plan:  Vancomycin 1000 mg IV x1 dose post op    Nicole Cella, RPh Clinical Pharmacist Pager: [phone] 05/10/2014,2:39 AM

## 2014-05-10 NOTE — Brief Op Note (Signed)
05/09/2014 - 05/10/2014  2:33 PM  PATIENT:  Rhonda Clayton  69 y.o. female  PRE-OPERATIVE DIAGNOSIS:  Idiopathic Scoliosis of ThoracoLumbar Region, Low back pain, Unspecified radiculopathy lumbar region, herniated lumbar disc with spinal stenosis  POST-OPERATIVE DIAGNOSIS:  Idiopathic Scoliosis of ThoracoLumbar Region, Low back pain, Unspecified radiculopathy lumbar region, herniated lumbar disc with spinal stenosis   PROCEDURE:  Procedure(s) with comments: Stage 2: T 10 to Ileum pedicle screw fixation with  Laminectomy L3-4 and microdiscectomy with posterolateral arthrodesis with Autograft, allograft T 10 to pelvis   (N/A) - Stage 2: Percutaneous Pedicle Screws T10 to Ileum; Laminectomy 99991111   APPLICATION OF INTRAOPERATIVE CT SCAN (N/A) - APPLICATION OF INTRAOPERATIVE CT SCAN  SURGEON:  Surgeon(s) and Role:    * Erline Levine, MD - Primary  PHYSICIAN ASSISTANT:   ASSISTANTS: Poteat, RN   ANESTHESIA:   general  EBL:  Total I/O In: 2920 [I.V.:2250; Blood:670] Out: 880 [Urine:455; Blood:425]  BLOOD ADMINISTERED: 2 Units PRBCs  DRAINS: (Medium) Hemovact drain(s) in the epidural space with  Suction Open   LOCAL MEDICATIONS USED:  MARCAINE     SPECIMEN:  No Specimen  DISPOSITION OF SPECIMEN:  N/A  COUNTS:  YES  TOURNIQUET:  * No tourniquets in log *  DICTATION: Patient is 69 year old woman who has undergone Stage I ALIF and XLIF yesterday who now presents for Stage II decompression and fusion T 10 to Ileum.  It was elected to take him to surgery for fusion from T 11 through ilium.  She has severe scoliosis with Preoperative Pelvic Parameters of PI 65, LL 35, PI-LL +30.  Procedure: Patient was placed in a prone position on the [place] table attachment for the Aero intra-op CT/ navigation system after smooth and uncomplicated induction of general endotracheal anesthesia. His lback was shaved and prepped and draped in usual sterile fashion with betadine scrub and DuraPrep. Area of  incision was infiltrated with local lidocaine. Incision was made to the lumbodorsal fascia was incised and exposure was performed of the T 10 to the ilium bilaterally.  Intraoperative CT scanwas obtained which confirmed correct orientation with marker probes at T 10 through ilium levels.   There was good correction of scoliotic curvature. The posterolateral region was extensively decorticated and pedicle screws were placed at T 10  through ilium bilaterally using intraoperative navigation.  The patient had extremely small pedicles, especially in the lower thoracic region and at L 1, the peciles were too small to instrument at all.  45 x 4.5 mm pedicle screws were placed at T 10, T 11, T 12 , bilaterally and 45 x 4.5 mm screws placed  at L 2 left, 4.5 x 45 mm screws at L 3 bilaterally L 3, 6.5 x 50 at L4 bilaterally, 6.5 x 50 Left L 5, 6.5 x 45 Right L 5 and both S 1 levels.  8.5 x 60 mm closed head screws were placed at the Ileum bilaterally using navigation.  Offset connectors were affixed to these screw heads. An intra-operative CT spin was performed with visualization of all screws.  The right T 10 screw appeared to have a medial trajectory, so this was repositioned with a more lateral trajectory.    A total laminectomy of L 3 and L 4 was performed and the thecal sac was mobilized medially.  An annulotomy was performed with discectomy.  The XLIF cage was visualized through this annulotomy and there did not appear to be significant persistent nerve compression after this decompression and discectomy.  Bendini rod bending system was utilized and rods were bent, affixed to screw heads and locked down in situ.   Autograft was harvested from both ilia for use in fusion and the spinous processes and laminae at L 3 and L 4 levels. along with marrow rich blood. Rods were cut, bent and locked down bilaterally in situ and the posterolateral region was packed with autograft, large BMP, Vitoss soaked in marrow-rich blood and  40 cc of allograft after decorticating the posterolateral region. The wound was irrigated. A medium Hemovac drain was placed. Long-acting Marcaine was injected into the paraspinous musculature.  Fascia was closed with 1 Vicryl sutures skin edges were reapproximated 2 and 3-0 Vicryl sutures. The wound was dressed with Dermabond and an occlusive dressing.  the patient was extubated in the operating room and taken to recovery in stable satisfactory condition she tolerated the operation well counts were correct at the end of the case.  At the conclusion of the surgery, preoperative PI-LL had been corrected from + 30 to + 11.  PLAN OF CARE: Admit to inpatient   PATIENT DISPOSITION:  PACU - hemodynamically stable.   Delay start of Pharmacological VTE agent (>24hrs) due to surgical blood loss or risk of bleeding: yes

## 2014-05-10 NOTE — Progress Notes (Signed)
Attempted to see pt.  Pt in middle of two stage spine surgery with stage 2 today.  Will check back on pt tomorrow. Jinger Neighbors, [place] [phone]

## 2014-05-10 NOTE — Progress Notes (Addendum)
Subjective: Patient reports sore in back and right hip.  Objective: Vital signs in last 24 hours: Temp:  [95.5 F (35.3 C)-98.4 F (36.9 C)] 97.4 F (36.3 C) (04/21 0400) Pulse Rate:  [77-91] 80 (04/21 0030) Resp:  [9-28] 16 (04/21 0030) BP: (112-146)/(48-68) 113/65 mmHg (04/21 0000) SpO2:  [97 %-100 %] 100 % (04/21 0030) Weight:  [75.297 kg (166 lb)] 75.297 kg (166 lb) (04/20 0749)  Intake/Output from previous day: 04/20 0701 - 04/21 0700 In: 4411.8 [I.V.:4211.8; IV Piggyback:200] Out: [id number] [Urine:1175; Blood:200] Intake/Output this shift:    Physical Exam: Good strength both legs with fullright hip flexor strength.  Abdomen soft.  No ileus.  Lab Results:  Recent Labs  05/09/14 0831  HGB 11.2*  HCT 33.0*   BMET  Recent Labs  05/09/14 0831  NA 134*  K 3.5  CL 99  GLUCOSE 190*  BUN 22  CREATININE 1.10    Studies/Results: Dg Lumbar Spine 2-3 Views  05/09/2014   CLINICAL DATA:  Lumbar discectomy and fusion  EXAM: LUMBAR SPINE - 2-3 VIEW  COMPARISON:  Earlier today  FINDINGS: Four C arm images show the lateral discectomy and fusion at L2-3 and L3-4. Previous anterior surgeries at L4-5 and L5-S1. No radiographically detectable complication.  IMPRESSION: Lateral discectomy and fusion at L2-3 and L3-4.   Electronically Signed   By: Nelson Chimes M.D.   On: 05/09/2014 20:12   Dg C-arm Gt 120 Min  05/09/2014   CLINICAL DATA:  69 year old female undergoing L4-S1 anterior lumbar interbody fusion and L2-L4 extreme lateral interbody fusion  EXAM: DG C-ARM GT 120 MIN  COMPARISON:  Preoperative lumbar spine MRI 03/10/2014  FINDINGS: For intraoperative spot images demonstrate interbody grafts at at L2-L3 and L3-L4 and surgical changes of anterior fusion with interbody grafts and vertebral body screws at L4-L5 and L5-S1. No evidence of immediate complication.  IMPRESSION: Surgical changes as above.   Electronically Signed   By: Jacqulynn Cadet M.D.   On: 05/09/2014 20:16   Dg Or  Local Abdomen  05/09/2014   CLINICAL DATA:  Anterior lumbar fusion.  Instrument count.  EXAM: OR LOCAL ABDOMEN  COMPARISON:  02/07/2014  FINDINGS: Anterior and interbody fusion L4-5 and L5-S1. The hardware appears to be well-positioned. Surgical clips overlying the left iliac wing and anterior to the sacrum related to recent surgical exposure. No retained instruments.  Moderate lumbar levoscoliosis.  IMPRESSION: Expected postoperative findings.  No retained instrument.  These results were called by telephone at the time of interpretation on 05/09/2014 at 4:10 pm to [facility] RN who verbally acknowledged these results.   Electronically Signed   By: Franchot Gallo M.D.   On: 05/09/2014 16:10    Assessment/Plan: Doing well.  Proceed with Stage II decompression and fusion.    LOS: 1 day    Peggyann Shoals, MD 05/10/2014, 7:20 AM

## 2014-05-10 NOTE — Interval H&P Note (Signed)
History and Physical Interval Note:  05/10/2014 7:21 AM  Rhonda Clayton  has presented today for surgery, with the diagnosis of Idiopathic Scoliosis of Lumbar Region, Low back pain, Unspecified radiculopathy lumbar region  The various methods of treatment have been discussed with the patient and family. After consideration of risks, benefits and other options for treatment, the patient has consented to  Procedure(s): Stage 2:Decompression and fusion T 10 to Ileum with Laminectomy and discectomy 99991111   (N/A) APPLICATION OF INTRAOPERATIVE CT SCAN (N/A) as a surgical intervention .  The patient's history has been reviewed, patient examined, no change in status, stable for surgery.  I have reviewed the patient's chart and labs.  Questions were answered to the patient's satisfaction.     Jamarii Banks D

## 2014-05-10 NOTE — H&P (View-Only) (Signed)
Subjective: Patient reports sore in back and right hip.  Objective: Vital signs in last 24 hours: Temp:  [95.5 F (35.3 C)-98.4 F (36.9 C)] 97.4 F (36.3 C) (04/21 0400) Pulse Rate:  [77-91] 80 (04/21 0030) Resp:  [9-28] 16 (04/21 0030) BP: (112-146)/(48-68) 113/65 mmHg (04/21 0000) SpO2:  [97 %-100 %] 100 % (04/21 0030) Weight:  [75.297 kg (166 lb)] 75.297 kg (166 lb) (04/20 0749)  Intake/Output from previous day: 04/20 0701 - 04/21 0700 In: 4411.8 [I.V.:4211.8; IV Piggyback:200] Out: [id number] [Urine:1175; Blood:200] Intake/Output this shift:    Physical Exam: Good strength both legs with mild right hip flexor weakness.  Lab Results:  Recent Labs  05/09/14 0831  HGB 11.2*  HCT 33.0*   BMET  Recent Labs  05/09/14 0831  NA 134*  K 3.5  CL 99  GLUCOSE 190*  BUN 22  CREATININE 1.10    Studies/Results: Dg Lumbar Spine 2-3 Views  05/09/2014   CLINICAL DATA:  Lumbar discectomy and fusion  EXAM: LUMBAR SPINE - 2-3 VIEW  COMPARISON:  Earlier today  FINDINGS: Four C arm images show the lateral discectomy and fusion at L2-3 and L3-4. Previous anterior surgeries at L4-5 and L5-S1. No radiographically detectable complication.  IMPRESSION: Lateral discectomy and fusion at L2-3 and L3-4.   Electronically Signed   By: Nelson Chimes M.D.   On: 05/09/2014 20:12   Dg C-arm Gt 120 Min  05/09/2014   CLINICAL DATA:  69 year old female undergoing L4-S1 anterior lumbar interbody fusion and L2-L4 extreme lateral interbody fusion  EXAM: DG C-ARM GT 120 MIN  COMPARISON:  Preoperative lumbar spine MRI 03/10/2014  FINDINGS: For intraoperative spot images demonstrate interbody grafts at at L2-L3 and L3-L4 and surgical changes of anterior fusion with interbody grafts and vertebral body screws at L4-L5 and L5-S1. No evidence of immediate complication.  IMPRESSION: Surgical changes as above.   Electronically Signed   By: Jacqulynn Cadet M.D.   On: 05/09/2014 20:16   Dg Or Local  Abdomen  05/09/2014   CLINICAL DATA:  Anterior lumbar fusion.  Instrument count.  EXAM: OR LOCAL ABDOMEN  COMPARISON:  02/07/2014  FINDINGS: Anterior and interbody fusion L4-5 and L5-S1. The hardware appears to be well-positioned. Surgical clips overlying the left iliac wing and anterior to the sacrum related to recent surgical exposure. No retained instruments.  Moderate lumbar levoscoliosis.  IMPRESSION: Expected postoperative findings.  No retained instrument.  These results were called by telephone at the time of interpretation on 05/09/2014 at 4:10 pm to [facility] RN who verbally acknowledged these results.   Electronically Signed   By: Franchot Gallo M.D.   On: 05/09/2014 16:10    Assessment/Plan: Doing well.  Proceed with Stage II decompression and fusion.    LOS: 1 day    Peggyann Shoals, MD 05/10/2014, 7:20 AM

## 2014-05-10 NOTE — Anesthesia Postprocedure Evaluation (Signed)
Anesthesia Post Note  Patient: Rhonda Clayton  Procedure(s) Performed: Procedure(s) (LRB): Stage 2: Percutaneous Pedicle Screws; Laminectomy 99991111   (N/A) APPLICATION OF INTRAOPERATIVE CT SCAN (N/A)  Anesthesia type: General  Patient location: PACU  Post pain: Pain level controlled  Post assessment: Post-op Vital signs reviewed  Last Vitals: BP 120/55 mmHg  Pulse 69  Temp(Src) 37.1 C (Axillary)  Resp 16  Ht 5\' 1"  (1.549 m)  Wt 166 lb (75.297 kg)  BMI 31.38 kg/m2  SpO2 100%  Post vital signs: Reviewed  Level of consciousness: sedated  Complications: No apparent anesthesia complications

## 2014-05-10 NOTE — Anesthesia Procedure Notes (Signed)
Procedure Name: Intubation Date/Time: 05/10/2014 8:35 AM Performed by: Merdis Delay Pre-anesthesia Checklist: Patient identified, Timeout performed, Emergency Drugs available, Suction available and Patient being monitored Patient Re-evaluated:Patient Re-evaluated prior to inductionOxygen Delivery Method: Circle system utilized Preoxygenation: Pre-oxygenation with 100% oxygen Intubation Type: IV induction Ventilation: Mask ventilation without difficulty Laryngoscope Size: Mac and 3 Grade View: Grade I Tube type: Oral Tube size: 7.5 mm Number of attempts: 1 Placement Confirmation: ETT inserted through vocal cords under direct vision,  breath sounds checked- equal and bilateral,  positive ETCO2 and CO2 detector Secured at: 23 cm Tube secured with: Tape Dental Injury: Teeth and Oropharynx as per pre-operative assessment

## 2014-05-11 LAB — GLUCOSE, CAPILLARY
GLUCOSE-CAPILLARY: 241 mg/dL — AB (ref 70–99)
Glucose-Capillary: 193 mg/dL — ABNORMAL HIGH (ref 70–99)
Glucose-Capillary: 301 mg/dL — ABNORMAL HIGH (ref 70–99)
Glucose-Capillary: 259 mg/dL — ABNORMAL HIGH (ref 70–99)

## 2014-05-11 LAB — HEMOGLOBIN A1C
HEMOGLOBIN A1C: 8.9 % — AB (ref 4.8–5.6)
Mean Plasma Glucose: 209 mg/dL

## 2014-05-11 MED ORDER — GLYBURIDE-METFORMIN 5-500 MG PO TABS
2.0000 | ORAL_TABLET | Freq: Two times a day (BID) | ORAL | Status: DC
  Administered 2014-05-11 – 2014-05-13 (×6): 2 via ORAL
  Filled 2014-05-11: qty 2

## 2014-05-11 MED ORDER — INSULIN ASPART 100 UNIT/ML ~~LOC~~ SOLN
0.0000 [IU] | Freq: Three times a day (TID) | SUBCUTANEOUS | Status: DC
  Administered 2014-05-12 (×2): 7 [IU] via SUBCUTANEOUS
  Administered 2014-05-12: 4 [IU] via SUBCUTANEOUS
  Administered 2014-05-13 (×3): 7 [IU] via SUBCUTANEOUS
  Administered 2014-05-14: 20 [IU] via SUBCUTANEOUS
  Administered 2014-05-14: 4 [IU] via SUBCUTANEOUS
  Administered 2014-05-15: 3 [IU] via SUBCUTANEOUS
  Administered 2014-05-15: 15 [IU] via SUBCUTANEOUS

## 2014-05-11 MED ORDER — PNEUMOCOCCAL VAC POLYVALENT 25 MCG/0.5ML IJ INJ
0.5000 mL | INJECTION | INTRAMUSCULAR | Status: DC
  Filled 2014-05-11 (×2): qty 0.5

## 2014-05-11 MED ORDER — INSULIN ASPART 100 UNIT/ML ~~LOC~~ SOLN
0.0000 [IU] | Freq: Every day | SUBCUTANEOUS | Status: DC
  Administered 2014-05-11: 3 [IU] via SUBCUTANEOUS
  Administered 2014-05-14: 2 [IU] via SUBCUTANEOUS

## 2014-05-11 MED ORDER — SODIUM CHLORIDE 0.9 % IV BOLUS (SEPSIS)
500.0000 mL | Freq: Once | INTRAVENOUS | Status: AC
  Administered 2014-05-11: 500 mL via INTRAVENOUS

## 2014-05-11 MED ORDER — WHITE PETROLATUM GEL
Status: AC
  Filled 2014-05-11: qty 1

## 2014-05-11 MED FILL — Heparin Sodium (Porcine) Inj 1000 Unit/ML: INTRAMUSCULAR | Qty: 30 | Status: AC

## 2014-05-11 MED FILL — Sodium Chloride IV Soln 0.9%: INTRAVENOUS | Qty: 1000 | Status: AC

## 2014-05-11 NOTE — Evaluation (Signed)
Physical Therapy Evaluation Patient Details Name: Rhonda Clayton MRN: [id number] DOB: 03/30/1945 Today's Date: 05/11/2014   History of Present Illness  69 yo female s/p x2 surgeries by Dr Vertell Limber for L3-4 laminectomy pedicle screws  & L4-5 L5- S1 ALIF with peek cages PMH: esophageal stricture, HTN, DJD, stress incontinence, DM2, RBBB, BIL torn rotator cuff  Clinical Impression  Patient demonstrates deficits in functional mobility as indicated below. Will need continued skilled PT to address deficits and maximize function. Will see as indicated and progress as tolerated. Patient will need continued rehabilitation upon acute discharge.    Follow Up Recommendations SNF;Supervision/Assistance - 24 hour    Equipment Recommendations  Rolling walker with 5" wheels;3in1 (PT)    Recommendations for Other Services       Precautions / Restrictions Precautions Precautions: Back Precaution Comments: back handout provided in room Required Braces or Orthoses: Spinal Brace Spinal Brace: Thoracolumbosacral orthotic;Applied in supine position (spoke with PA brian now reports able to don in sitting) Restrictions Weight Bearing Restrictions: No      Mobility  Bed Mobility Overal bed mobility: +2 for physical assistance;Needs Assistance;+ 2 for safety/equipment Bed Mobility: Supine to Sit;Rolling Rolling: +2 for safety/equipment;Mod assist;+2 for physical assistance   Supine to sit: +2 for physical assistance;+2 for safety/equipment;Mod assist     General bed mobility comments: Pt needed max cues for sequence and pt with eyes closed majority of session. Pt with premedication of pain meds  Transfers Overall transfer level: Needs assistance Equipment used: Rolling walker (2 wheeled) Transfers: Sit to/from [employer] Transfers Sit to Stand: +2 physical assistance;+2 safety/equipment;Min assist Stand pivot transfers: Min assist;+2 safety/equipment;+2 physical assistance       General  transfer comment: cues for hand placment and hand over hand to help achieve  Ambulation/Gait                Stairs            Wheelchair Mobility    Modified Rankin (Stroke Patients Only)       Balance Overall balance assessment: Needs assistance Sitting-balance support: Feet supported Sitting balance-Leahy Scale: Poor     Standing balance support: Bilateral upper extremity supported Standing balance-Leahy Scale: Poor                               Pertinent Vitals/Pain Pain Assessment: Faces Faces Pain Scale: Hurts even more Pain Location: all over Pain Descriptors / Indicators: Aching Pain Intervention(s): Limited activity within patient's tolerance;Monitored during session;Repositioned    Home Living Family/patient expects to be discharged to:: Private residence Living Arrangements: Alone Available Help at Discharge: Family Type of Home: House Home Access: Stairs to enter Entrance Stairs-Rails: None Technical brewer of Steps: 3 Home Layout: One level Home Equipment: Shower seat - built in Additional Comments: walk in shower with built, standard    Prior Function Level of Independence: Independent               Hand Dominance   Dominant Hand: Right    Extremity/Trunk Assessment   Upper Extremity Assessment: RUE deficits/detail;LUE deficits/detail RUE Deficits / Details: not fully assessed due to back surg and hx of torn rotator cuff per notes. pt able to complete 60 degree of shoulder abduction     LUE Deficits / Details: no fully assessed due to back surg and hx of torn rotator cuff per notes. pt able to complete 60 degree of shoulder abduction  Lower Extremity Assessment: Generalized weakness (symetrical sensation and gross strength)      Cervical / Trunk Assessment: Other exceptions (2 stage surg)  Communication   Communication: No difficulties  Cognition Arousal/Alertness: Lethargic Behavior During Therapy:  Flat affect Overall Cognitive Status: Within Functional Limits for tasks assessed                      General Comments      Exercises        Assessment/Plan    PT Assessment Patient needs continued PT services  PT Diagnosis Difficulty walking;Acute pain   PT Problem List Decreased strength;Decreased range of motion;Decreased activity tolerance;Decreased balance;Decreased mobility;Decreased knowledge of use of DME;Decreased safety awareness;Pain  PT Treatment Interventions DME instruction;Gait training;Stair training;Functional mobility training;Therapeutic activities;Therapeutic exercise;Balance training;Patient/family education   PT Goals (Current goals can be found in the Care Plan section) Acute Rehab PT Goals Patient Stated Goal: to get back to doing independent task PT Goal Formulation: With patient Time For Goal Achievement: 05/25/14 Potential to Achieve Goals: Good    Frequency Min 5X/week   Barriers to discharge        Co-evaluation PT/OT/SLP Co-Evaluation/Treatment: Yes Reason for Co-Treatment: Complexity of the patient's impairments (multi-system involvement) PT goals addressed during session: Mobility/safety with mobility OT goals addressed during session: ADL's and self-care       End of Session Equipment Utilized During Treatment: Gait belt;Back brace Activity Tolerance: Patient limited by pain;Other (comment) (increased dizziness BP 90s/40s) Patient left: in chair;in CPM;with nursing/sitter in room;with family/visitor present Nurse Communication: Mobility status;Precautions;Other (comment) (90s/40s BP)         Time: [id number] PT Time Calculation (min) (ACUTE ONLY): 32 min   Charges:   PT Evaluation $Initial PT Evaluation Tier I: 1 Procedure     PT G CodesDuncan Dull 05-15-14, 3:45 PM Alben Deeds, [place] DPT  [phone]

## 2014-05-11 NOTE — Progress Notes (Signed)
Pt was unable to void by 1230. Bladder san show 646mL. Foley catheter was reinserted.

## 2014-05-11 NOTE — Clinical Social Work Note (Signed)
Clinical Social Work Assessment  Patient Details  Name: Rhonda Clayton MRN: [id number] Date of Birth: 12/06/1945  Date of referral:  05/11/14               Reason for consult:  Facility Placement                 Housing/Transportation Living arrangements for the past 2 months:  Single Family Home Source of Information:  Patient Patient Interpreter Needed:  None Criminal Activity/Legal Involvement Pertinent to Current Situation/Hospitalization:  No - Comment as needed Significant Relationships:  Adult Children Lives with:  Self Do you feel safe going back to the place where you live?  No Need for family participation in patient care:  No (Coment)  Care giving concerns: N/A   Social Worker assessment / plan: CSW met the pt at the bedside.  CSW introduced self and purpose of the visit. CSW discussed clinical recommendation for SNF rehab. CSW inquired about the geographical location in which the pt would like to receive rehab from. Pt reported that she will be going to [facility]. CSW explained the SNF rehab process to the pt. CSW and pt discussed insurance and its relation to SNF rehab. CSW answered all questions in which the pt inquired about. CSW provided pt with contact information for further questions. CSW will continue to follow this pt and assist with discharge as needed.   Patient/Family's Response to care:  The pt already arranged SNF placement at [facility].   Patient/Family's Understanding of and Emotional Response to Diagnosis, Current Treatment, and Prognosis:  The pt expressed feelings of pride. The pt reported that she was happy to undergo surgery after the doctor explained the complications she will have if she did not get the surgery.   Emotional Assessment Appearance:  Appears stated age Attitude/Demeanor/Rapport:  Other (Positive ) Affect (typically observed):  Accepting, Calm, Pleasant Orientation:  Oriented to Self, Oriented to Place, Oriented to  Time, Oriented  to Situation Alcohol / Substance use:  Never Used Psych involvement (Current and /or in the community):  No (Comment)  Discharge Needs  Concerns to be addressed:  Denies Needs/Concerns at this time Current discharge risk:  None Barriers to Discharge:  No Barriers Identified   Dysheka Bibbs, MSW, LCSWA [phone] 

## 2014-05-11 NOTE — Progress Notes (Signed)
Pt is admitted to room 4N31 from PACU.. Admission vital signs are stable

## 2014-05-11 NOTE — Clinical Social Work Placement (Signed)
   CLINICAL SOCIAL WORK PLACEMENT  NOTE  Date:  05/11/2014  Patient Details  Name: Rhonda Clayton MRN: [id number] Date of Birth: 1945/02/07  Clinical Social Work is seeking post-discharge placement for this patient at the [place] level of care (*CSW will initial, date and re-position this form in  chart as items are completed):  Yes   Patient/family provided with [facility] Work Department's list of facilities offering this level of care within the geographic area requested by the patient (or if unable, by the patient's family).  Yes   Patient/family informed of their freedom to choose among providers that offer the needed level of care, that participate in Medicare, Medicaid or managed care program needed by the patient, have an available bed and are willing to accept the patient.  Yes   Patient/family informed of [place] [facility]'s ownership interest in [facility] and [facility], as well as of the fact that they are under no obligation to receive care at these facilities.  PASRR submitted to EDS on 05/11/14     PASRR number received on 05/11/14     Existing PASRR number confirmed on       FL2 transmitted to all facilities in geographic area requested by pt/family on 05/11/14     FL2 transmitted to all facilities within larger geographic area on       Patient informed that his/her managed care company has contracts with or will negotiate with certain facilities, including the following:        Yes   Patient/family informed of bed offers received.  Patient chooses bed at [facility]     Physician recommends and patient chooses bed at      Patient to be transferred to   on  .  Patient to be transferred to facility by       Patient family notified on   of transfer.  Name of family member notified:        PHYSICIAN       Additional Comment:    _______________________________________________ Greta Doom, LCSW 05/11/2014, 12:55  PM

## 2014-05-11 NOTE — Progress Notes (Signed)
Call from Dr Vertell Limber to change SSI to Resistance. Order noted.

## 2014-05-11 NOTE — Evaluation (Signed)
Occupational Therapy Evaluation Patient Details Name: Rhonda Clayton MRN: [id number] DOB: 1945-02-07 Today's Date: 05/11/2014    History of Present Illness 69 yo female s/p x2 surgeries by Dr Vertell Limber for L3-4 laminectomy pedicle screws  & L4-5 L5- S1 ALIF with peek cages PMH: esophageal stricture, HTN, DJD, stress incontinence, DM2, RBBB, BIL torn rotator cuff   Clinical Impression   Patient is s/p 2 stage L3-4 laminectomy L 4-5 L5-s1 ALIF cage surgery resulting in functional limitations due to the deficits listed below (see OT problem list). Pt will require therapy prior to d/c home and incr activity tolerance. Pt lethargic this session and suspect due to medication / x2 surgeries.  Patient will benefit from skilled OT acutely to increase independence and safety with ADLS to allow discharge SNF.     Follow Up Recommendations  SNF    Equipment Recommendations  Other (comment) (defer SNF)    Recommendations for Other Services       Precautions / Restrictions Precautions Precautions: Back Precaution Comments: back handout provided in room Required Braces or Orthoses: Spinal Brace Spinal Brace: Thoracolumbosacral orthotic;Applied in supine position (spoke with PA brian now reports able to don in sitting) Restrictions Weight Bearing Restrictions: No      Mobility Bed Mobility Overal bed mobility: +2 for physical assistance;Needs Assistance;+ 2 for safety/equipment Bed Mobility: Supine to Sit;Rolling Rolling: +2 for safety/equipment;Mod assist;+2 for physical assistance   Supine to sit: +2 for physical assistance;+2 for safety/equipment;Mod assist     General bed mobility comments: Pt needed max cues for sequence and pt with eyes closed majority of session. Pt with premedication of pain meds  Transfers Overall transfer level: Needs assistance Equipment used: Rolling walker (2 wheeled) Transfers: Sit to/from [employer] Sit to Stand: +2 physical  assistance;+2 safety/equipment;Min assist Stand pivot transfers: Min assist;+2 safety/equipment;+2 physical assistance       General transfer comment: cues for hand placment and hand over hand to help achieve    Balance Overall balance assessment: Needs assistance Sitting-balance support: Feet supported;Bilateral upper extremity supported Sitting balance-Leahy Scale: Poor     Standing balance support: Bilateral upper extremity supported;During functional activity Standing balance-Leahy Scale: Poor                              ADL Overall ADL's : Needs assistance/impaired Eating/Feeding: Set up;Sitting   Grooming: Wash/dry face;Set up;Sitting   Upper Body Bathing: Maximal assistance;Sitting   Lower Body Bathing: Total assistance;Bed level           Toilet Transfer: +2 for physical assistance;+2 for safety/equipment;Minimal assistance;BSC Toilet Transfer Details (indicate cue type and reason): stand pivot to the R side with two person hand held (A)  Toileting- Clothing Manipulation and Hygiene: Total assistance Toileting - Clothing Manipulation Details (indicate cue type and reason): total (A) by OT in static standing       General ADL Comments: Pt supine on arrival and log rolled into brace. Brace required total +2 (A) to don and was difficult to position due to straps. PA Aaron Edelman agreeable for next session to don in sitting to ensure proper fit. THis session pt sittin gEOB and brace correctly repositioned due to not aligned with supine log roll. Pt tolerated well. Pt completed sit<>stnad to 3n1 with very minimal spots of urine in pot. Pt reports dizziness and inabiliyt to ambulate. Pt with recliner placed behind patient for sit<>Stand<> sit. Pt positioned in chair with decr BP.  RN KOM present and starting bolus. Pt could benefit from back precautions with adls next session     Vision     Perception     Praxis      Pertinent Vitals/Pain Pain Assessment:  Faces Faces Pain Scale: Hurts even more Pain Location: all over Pain Descriptors / Indicators: Aching Pain Intervention(s): Monitored during session;Premedicated before session;Repositioned     Hand Dominance Right   Extremity/Trunk Assessment Upper Extremity Assessment Upper Extremity Assessment: LUE deficits/detail;RUE deficits/detail RUE Deficits / Details: not fully assessed due to back surg and hx of torn rotator cuff per notes. pt able to complete 60 degree of shoulder abduction LUE Deficits / Details: no fully assessed due to back surg and hx of torn rotator cuff per notes. pt able to complete 60 degree of shoulder abduction   Lower Extremity Assessment Lower Extremity Assessment: Defer to PT evaluation   Cervical / Trunk Assessment Cervical / Trunk Assessment: Other exceptions (2 stage surg)   Communication Communication Communication: No difficulties   Cognition Arousal/Alertness: Lethargic Behavior During Therapy: Flat affect Overall Cognitive Status: Within Functional Limits for tasks assessed                     General Comments       Exercises       Shoulder Instructions      Home Living Family/patient expects to be discharged to:: Private residence Living Arrangements: Alone Available Help at Discharge: Family Type of Home: House Home Access: Stairs to enter Technical brewer of Steps: 3 Entrance Stairs-Rails: None Home Layout: One level     Bathroom Shower/Tub: Occupational psychologist: Standard     Home Equipment: Shower seat - built in   Additional Comments: walk in shower with built, standard      Prior Functioning/Environment Level of Independence: Independent             OT Diagnosis: Generalized weakness;Cognitive deficits;Acute pain   OT Problem List: Decreased strength;Decreased activity tolerance;Impaired balance (sitting and/or standing);Decreased safety awareness;Decreased knowledge of use of DME or  AE;Decreased knowledge of precautions;Obesity;Pain   OT Treatment/Interventions: Self-care/ADL training;Therapeutic exercise;Neuromuscular education;DME and/or AE instruction;Therapeutic activities;Patient/family education;Balance training    OT Goals(Current goals can be found in the care plan section) Acute Rehab OT Goals Patient Stated Goal: to get back to doing independent task OT Goal Formulation: With patient/family Time For Goal Achievement: 05/25/14 Potential to Achieve Goals: Good  OT Frequency: Min 2X/week   Barriers to D/C:            Co-evaluation PT/OT/SLP Co-Evaluation/Treatment: Yes Reason for Co-Treatment: Complexity of the patient's impairments (multi-system involvement);For patient/therapist safety   OT goals addressed during session: ADL's and self-care      End of Session Equipment Utilized During Treatment: Gait belt;Rolling walker;Back brace Nurse Communication: Mobility status;Precautions  Activity Tolerance: Patient limited by lethargy Patient left: in chair;with call bell/phone within reach;with nursing/sitter in room;with family/visitor present   Time: [id number] OT Time Calculation (min): 35 min Charges:  OT General Charges $OT Visit: 1 Procedure OT Evaluation $Initial OT Evaluation Tier I: 1 Procedure G-Codes:    Peri Maris 2014/05/21, 12:45 PM  Pager: [phone]

## 2014-05-11 NOTE — Progress Notes (Signed)
Subjective: Patient reports "My pain's not too bad until I get up"  Objective: Vital signs in last 24 hours: Temp:  [97.3 F (36.3 C)-99.5 F (37.5 C)] 97.3 F (36.3 C) (04/22 0923) Pulse Rate:  [69-109] 99 (04/22 0923) Resp:  [12-18] 16 (04/22 0923) BP: (101-124)/(45-61) 101/45 mmHg (04/22 0923) SpO2:  [96 %-100 %] 96 % (04/22 0923)  Intake/Output from previous day: 04/21 0701 - 04/22 0700 In: 2920 [I.V.:2250; Blood:670] Out: 1250 [Urine:455; Drains:370; Blood:425] Intake/Output this shift: Total I/O In: 120 [P.O.:120] Out: -   Alert, up in chair. Pain well controlled.  OOB to [facility] and now chair, with orthostatic hypotension. NS bolus infusing. Strength is good BLE.  Inspesction of incision reveals intact honeycomb drsg, no erythema, swelling, or drainage. Hemovac patent. Using incentive spirometer. Reports bad dreams last night, otherwise in good spirits.   Lab Results:  Recent Labs  05/09/14 0831  HGB 11.2*  HCT 33.0*   BMET  Recent Labs  05/09/14 0831  NA 134*  K 3.5  CL 99  GLUCOSE 190*  BUN 22  CREATININE 1.10    Studies/Results: Dg Lumbar Spine 2-3 Views  05/09/2014   CLINICAL DATA:  Lumbar discectomy and fusion  EXAM: LUMBAR SPINE - 2-3 VIEW  COMPARISON:  Earlier today  FINDINGS: Four C arm images show the lateral discectomy and fusion at L2-3 and L3-4. Previous anterior surgeries at L4-5 and L5-S1. No radiographically detectable complication.  IMPRESSION: Lateral discectomy and fusion at L2-3 and L3-4.   Electronically Signed   By: Nelson Chimes M.D.   On: 05/09/2014 20:12   Dg C-arm Gt 120 Min  05/09/2014   CLINICAL DATA:  69 year old female undergoing L4-S1 anterior lumbar interbody fusion and L2-L4 extreme lateral interbody fusion  EXAM: DG C-ARM GT 120 MIN  COMPARISON:  Preoperative lumbar spine MRI 03/10/2014  FINDINGS: For intraoperative spot images demonstrate interbody grafts at at L2-L3 and L3-L4 and surgical changes of anterior fusion with interbody  grafts and vertebral body screws at L4-L5 and L5-S1. No evidence of immediate complication.  IMPRESSION: Surgical changes as above.   Electronically Signed   By: Jacqulynn Cadet M.D.   On: 05/09/2014 20:16   Dg Or Local Abdomen  05/09/2014   CLINICAL DATA:  Anterior lumbar fusion.  Instrument count.  EXAM: OR LOCAL ABDOMEN  COMPARISON:  02/07/2014  FINDINGS: Anterior and interbody fusion L4-5 and L5-S1. The hardware appears to be well-positioned. Surgical clips overlying the left iliac wing and anterior to the sacrum related to recent surgical exposure. No retained instruments.  Moderate lumbar levoscoliosis.  IMPRESSION: Expected postoperative findings.  No retained instrument.  These results were called by telephone at the time of interpretation on 05/09/2014 at 4:10 pm to [facility] RN who verbally acknowledged these results.   Electronically Signed   By: Franchot Gallo M.D.   On: 05/09/2014 16:10    Assessment/Plan: Improving   LOS: 2 days  Ok to don TLSO in sitting position. Unable to don supine d/t thoracic attachments. Continue to mobilize with PT/OT as tolerated.   Verdis Prime 05/11/2014, 11:50 AM

## 2014-05-12 LAB — CBC WITH DIFFERENTIAL/PLATELET
BASOS ABS: 0 10*3/uL (ref 0.0–0.1)
Basophils Relative: 0 % (ref 0–1)
Eosinophils Absolute: 0.2 10*3/uL (ref 0.0–0.7)
Eosinophils Relative: 2 % (ref 0–5)
HCT: 23.1 % — ABNORMAL LOW (ref 36.0–46.0)
Hemoglobin: 8 g/dL — ABNORMAL LOW (ref 12.0–15.0)
LYMPHS ABS: 0.7 10*3/uL (ref 0.7–4.0)
LYMPHS PCT: 7 % — AB (ref 12–46)
MCH: 28.4 pg (ref 26.0–34.0)
MCHC: 34.6 g/dL (ref 30.0–36.0)
MCV: 81.9 fL (ref 78.0–100.0)
Monocytes Absolute: 1.1 10*3/uL — ABNORMAL HIGH (ref 0.1–1.0)
Monocytes Relative: 11 % (ref 3–12)
NEUTROS ABS: 8.7 10*3/uL — AB (ref 1.7–7.7)
Neutrophils Relative %: 80 % — ABNORMAL HIGH (ref 43–77)
PLATELETS: 235 10*3/uL (ref 150–400)
RBC: 2.82 MIL/uL — AB (ref 3.87–5.11)
RDW: 14 % (ref 11.5–15.5)
WBC: 10.7 10*3/uL — AB (ref 4.0–10.5)

## 2014-05-12 LAB — COMPREHENSIVE METABOLIC PANEL
ALBUMIN: 2.1 g/dL — AB (ref 3.5–5.2)
ALK PHOS: 76 U/L (ref 39–117)
ALT: 25 U/L (ref 0–35)
AST: 39 U/L — AB (ref 0–37)
Anion gap: 8 (ref 5–15)
BILIRUBIN TOTAL: 0.8 mg/dL (ref 0.3–1.2)
BUN: 13 mg/dL (ref 6–23)
CHLORIDE: 98 mmol/L (ref 96–112)
CO2: 21 mmol/L (ref 19–32)
Calcium: 7.5 mg/dL — ABNORMAL LOW (ref 8.4–10.5)
Creatinine, Ser: 1.2 mg/dL — ABNORMAL HIGH (ref 0.50–1.10)
GFR calc Af Amer: 51 mL/min — ABNORMAL LOW (ref >90)
GFR calc non Af Amer: 44 mL/min — ABNORMAL LOW (ref >90)
Glucose, Bld: 257 mg/dL — ABNORMAL HIGH (ref 70–99)
POTASSIUM: 4.3 mmol/L (ref 3.5–5.1)
SODIUM: 127 mmol/L — AB (ref 135–145)
TOTAL PROTEIN: 5.1 g/dL — AB (ref 6.0–8.3)

## 2014-05-12 LAB — GLUCOSE, CAPILLARY
GLUCOSE-CAPILLARY: 192 mg/dL — AB (ref 70–99)
GLUCOSE-CAPILLARY: 135 mg/dL — AB (ref 70–99)
Glucose-Capillary: 240 mg/dL — ABNORMAL HIGH (ref 70–99)
Glucose-Capillary: 208 mg/dL — ABNORMAL HIGH (ref 70–99)

## 2014-05-12 LAB — PREPARE RBC (CROSSMATCH)

## 2014-05-12 MED ORDER — SODIUM CHLORIDE 0.9 % IV SOLN
Freq: Once | INTRAVENOUS | Status: AC
  Administered 2014-05-12: 13:00:00 via INTRAVENOUS

## 2014-05-12 MED ORDER — ACETAMINOPHEN 325 MG PO TABS
650.0000 mg | ORAL_TABLET | Freq: Once | ORAL | Status: AC
  Administered 2014-05-12: 650 mg via ORAL
  Filled 2014-05-12: qty 2

## 2014-05-12 MED ORDER — CLOBETASOL PROPIONATE 0.05 % EX CREA
TOPICAL_CREAM | Freq: Every day | CUTANEOUS | Status: DC
  Administered 2014-05-12: 23:00:00 via TOPICAL
  Administered 2014-05-13: 1 via TOPICAL
  Administered 2014-05-15: 02:00:00 via TOPICAL
  Filled 2014-05-12: qty 15

## 2014-05-12 MED ORDER — INSULIN ASPART 100 UNIT/ML ~~LOC~~ SOLN: 0.0000 [IU] | Freq: Every day | SUBCUTANEOUS | Status: DC

## 2014-05-12 MED ORDER — INSULIN ASPART 100 UNIT/ML ~~LOC~~ SOLN
4.0000 [IU] | Freq: Three times a day (TID) | SUBCUTANEOUS | Status: DC
  Administered 2014-05-13 – 2014-05-15 (×6): 4 [IU] via SUBCUTANEOUS

## 2014-05-12 MED ORDER — SODIUM CHLORIDE 0.9 % IV BOLUS (SEPSIS)
500.0000 mL | Freq: Once | INTRAVENOUS | Status: AC
  Administered 2014-05-12: 500 mL via INTRAVENOUS

## 2014-05-12 MED ORDER — SODIUM CHLORIDE 0.9 % IV SOLN
INTRAVENOUS | Status: DC
  Administered 2014-05-12: 19:00:00 via INTRAVENOUS

## 2014-05-12 MED ORDER — DIPHENHYDRAMINE HCL 25 MG PO CAPS
25.0000 mg | ORAL_CAPSULE | Freq: Once | ORAL | Status: AC
  Administered 2014-05-12: 25 mg via ORAL
  Filled 2014-05-12: qty 1

## 2014-05-12 MED ORDER — PANTOPRAZOLE SODIUM 40 MG PO TBEC
40.0000 mg | DELAYED_RELEASE_TABLET | Freq: Every day | ORAL | Status: DC
  Administered 2014-05-12 – 2014-05-15 (×4): 40 mg via ORAL
  Filled 2014-05-12 (×4): qty 1

## 2014-05-12 MED ORDER — INSULIN ASPART 100 UNIT/ML ~~LOC~~ SOLN: 0.0000 [IU] | Freq: Three times a day (TID) | SUBCUTANEOUS | Status: DC

## 2014-05-12 NOTE — Progress Notes (Signed)
PT Cancellation Note  Patient Details Name: Rhonda Clayton MRN: [id number] DOB: Oct 17, 1945   Cancelled Treatment:    Reason Eval/Treat Not Completed: Medical issues which prohibited therapy Rn requesting to hold therapy services today secondary to low BP and pt getting blood transfusion. Will follow up next available time when pt more stable.   Candy Sledge A 05/12/2014, 1:51 PM Candy Sledge, [place], DPT [phone]

## 2014-05-12 NOTE — Progress Notes (Signed)
Subjective: Patient reports sore and tired feeling  Objective: Vital signs in last 24 hours: Temp:  [97.3 F (36.3 C)-99.7 F (37.6 C)] 97.3 F (36.3 C) (04/23 0929) Pulse Rate:  [87-103] 103 (04/23 0929) Resp:  [16-20] 16 (04/23 0929) BP: (94-121)/(35-45) 110/45 mmHg (04/23 0929) SpO2:  [92 %-99 %] 96 % (04/23 0929)  Intake/Output from previous day: 04/22 0701 - 04/23 0700 In: 360 [P.O.:360] Out: 5140 [Urine:5000; Drains:140] Intake/Output this shift:    Physical Exam: Dressings CDI.  Full strength both lower extremities.  Abdomen soft.  Lab Results: No results for input(s): WBC, HGB, HCT, PLT in the last 72 hours. BMET No results for input(s): NA, K, CL, CO2, GLUCOSE, BUN, CREATININE, CALCIUM in the last 72 hours.  Studies/Results: No results found.  Assessment/Plan: Transfuse 2 units RBCs for acute blood loss post-surgical anemia.  Continue Hemovac drain today.  Resistant sliding scale for elevated CBGs.      LOS: 3 days    Peggyann Shoals, MD 05/12/2014, 10:39 AM

## 2014-05-12 NOTE — Progress Notes (Signed)
Patient BP's are soft and complexion is pale.  MD paged and order received to give 500cc Bolus of NS, CMET and CBC labs order.  Will continue to monitor patient.

## 2014-05-13 LAB — TYPE AND SCREEN
ABO/RH(D): B POS
ANTIBODY SCREEN: NEGATIVE
UNIT DIVISION: 0
UNIT DIVISION: 0
Unit division: 0
Unit division: 0
Unit division: 0
Unit division: 0

## 2014-05-13 LAB — CBC
HCT: 28.7 % — ABNORMAL LOW (ref 36.0–46.0)
Hemoglobin: 9.7 g/dL — ABNORMAL LOW (ref 12.0–15.0)
MCH: 28.2 pg (ref 26.0–34.0)
MCHC: 33.8 g/dL (ref 30.0–36.0)
MCV: 83.4 fL (ref 78.0–100.0)
Platelets: 227 10*3/uL (ref 150–400)
RBC: 3.44 MIL/uL — ABNORMAL LOW (ref 3.87–5.11)
RDW: 14 % (ref 11.5–15.5)
WBC: 7.5 10*3/uL (ref 4.0–10.5)

## 2014-05-13 LAB — GLUCOSE, CAPILLARY
GLUCOSE-CAPILLARY: 208 mg/dL — AB (ref 70–99)
Glucose-Capillary: 220 mg/dL — ABNORMAL HIGH (ref 70–99)
Glucose-Capillary: 205 mg/dL — ABNORMAL HIGH (ref 70–99)
Glucose-Capillary: 85 mg/dL (ref 70–99)

## 2014-05-13 NOTE — Progress Notes (Signed)
Hemovacc removed per MD order.  Patient tolerated well.

## 2014-05-13 NOTE — Progress Notes (Signed)
Subjective: Patient reports feeling much better after transfusion  Objective: Vital signs in last 24 hours: Temp:  [97.3 F (36.3 C)-100 F (37.8 C)] 97.9 F (36.6 C) (04/24 0145) Pulse Rate:  [88-103] 101 (04/24 0145) Resp:  [16-18] 18 (04/24 0145) BP: (97-145)/(45-87) 141/55 mmHg (04/24 0145) SpO2:  [96 %-100 %] 96 % (04/24 0145)  Intake/Output from previous day: 04/23 0701 - 04/24 0700 In: 647 [I.V.:60; Blood:587] Out: 7600 [Urine:7600] Intake/Output this shift:    Physical Exam: Standing up straight for first time in years and walking with a walker.  Leg strength good.  Dressings CDI.  Lab Results:  Recent Labs  05/12/14 1044  WBC 10.7*  HGB 8.0*  HCT 23.1*  PLT 235   BMET  Recent Labs  05/12/14 1044  NA 127*  K 4.3  CL 98  CO2 21  GLUCOSE 257*  BUN 13  CREATININE 1.20*  CALCIUM 7.5*    Studies/Results: No results found.  Assessment/Plan: Continue to mobilize with PT.  Feeling much better after transfusion with better color and good increase in Hgb after transfusion.    LOS: 4 days    Peggyann Shoals, MD 05/13/2014, 8:04 AM

## 2014-05-13 NOTE — Progress Notes (Addendum)
Occupational Therapy Treatment Patient Details Name: Rhonda Clayton MRN: [id number] DOB: 07-11-1945 Today's Date: 05/13/2014    History of present illness 69 yo female s/p x2 surgeries by Dr Vertell Limber for L3-4 laminectomy pedicle screws  & L4-5 L5- S1 ALIF with peek cages PMH: esophageal stricture, HTN, DJD, stress incontinence, DM2, RBBB, BIL torn rotator cuff   OT comments  Pt progressing. Pt able to ambulate to bathroom and perform grooming at sink. Continue to recommend SNF for rehab.   Follow Up Recommendations  SNF    Equipment Recommendations  Other (comment) (defer to SNF)    Recommendations for Other Services      Precautions / Restrictions Precautions Precautions: Back;Fall Precaution Booklet Issued: No Precaution Comments: educated on back precautions Required Braces or Orthoses: Spinal Brace Spinal Brace: Thoracolumbosacral orthotic;Applied in sitting position Restrictions Weight Bearing Restrictions: No       Mobility Bed Mobility Overal bed mobility: Needs Assistance Bed Mobility: Rolling;Sidelying to Sit;Sit to Sidelying Rolling: Mod assist Sidelying to sit: Mod assist (Mod assist for trunk and assist also to scoot EOB.)     Sit to sidelying: Mod assist General bed mobility comments: assist with legs when returning to sidelying position. Assist with trunk to come to sitting position and assist to scoot EOB.   Transfers Overall transfer level: Needs assistance Equipment used: Rolling walker (2 wheeled) Transfers: Sit to/from Stand Sit to Stand: Mod assist;From elevated surface         General transfer comment: cues for technique. Assist to boost.    Balance  assist to scoot EOB-once feet supported pt able to hold self upright without physical assist                                 ADL Overall ADL's : Needs assistance/impaired     Grooming: Wash/dry face;Oral care;Min guard;Standing;Brushing hair (washed ears as well)            Upper Body Dressing : Sitting;Maximal assistance (back brace)       Toilet Transfer: Min guard;Minimal assistance;+2 for safety/equipment;Ambulation;RW (bed)           Functional mobility during ADLs: Min guard;Minimal assistance;Rolling walker;+2 for safety/equipment General ADL Comments: OT assisted with back brace. Educated on use of cup for oral care and placement of grooming items to avoid breaking precautions.       Vision                     Perception     Praxis      Cognition  Awake/Alert Behavior During Therapy: WFL for tasks assessed/performed Overall Cognitive Status:  (unsure of baseline) Area of Impairment: Memory;Problem solving     Memory: Decreased recall of precautions;Decreased short-term memory        Problem Solving: Slow processing      Extremity/Trunk Assessment               Exercises     Shoulder Instructions       General Comments      Pertinent Vitals/ Pain       Pain Assessment: 0-10 Pain Score: 4  Pain Location: back and right leg Pain Intervention(s): Monitored during session;Repositioned;Other (comment) (RN came in session to give pain meds)  Home Living  Prior Functioning/Environment              Frequency Min 2X/week     Progress Toward Goals  OT Goals(current goals can now be found in the care plan section)  Progress towards OT goals: Progressing toward goals-updated goal  Acute Rehab OT Goals Patient Stated Goal: to get better OT Goal Formulation: With patient/family Time For Goal Achievement: 05/25/14 Potential to Achieve Goals: Good ADL Goals Pt Will Perform Grooming: with set-up;with supervision;standing Pt Will Perform Upper Body Bathing: with min assist;sitting Pt Will Transfer to Toilet: with min assist;bedside commode Additional ADL Goal #1: Pt will don brace mod I at EOB Additional ADL Goal #2: Pt will complete bed  mobility MIN (A) with hob < 20 degrees no bed rails  Plan Discharge plan remains appropriate    Co-evaluation                 End of Session Equipment Utilized During Treatment: Gait belt;Rolling walker;Back brace   Activity Tolerance Other (comment);Patient limited by fatigue (nauseous)   Patient Left in bed;with call bell/phone within reach;with family/visitor present;with nursing/sitter in room   Nurse Communication Mobility status;Other (comment) (pt stating pain level with nurse present; discussed positioning of bed without brace on)        Time: [id number] OT Time Calculation (min): 34 min  Charges: OT General Charges $OT Visit: 1 Procedure OT Treatments $Self Care/Home Management : 8-22 mins $Therapeutic Activity: 8-22 mins  Benito Mccreedy OTR/L [id number] 05/13/2014, 5:08 PM

## 2014-05-13 NOTE — Progress Notes (Signed)
ANTIBIOTIC CONSULT NOTE - FOLLOW UP  Pharmacy Consult:  Vancomycin Indication:  Surgical prophylaxis  Allergies  Allergen Reactions  . Coffee Flavor Other (See Comments)    Sick on stomach, sneezing, backed up  . Cephalexin Other (See Comments)    Yeast infection  . Ciprofloxacin Other (See Comments)    tendinitis  . Keflex [Cephalexin] Other (See Comments)    As long as taking Diflucan with it  . Oatmeal Other (See Comments)    Sneezing and drainage  . Sulfa Antibiotics Other (See Comments)    Yeast infection    Patient Measurements: Height: 5\' 1"  (154.9 cm) Weight: 166 lb (75.297 kg) IBW/kg (Calculated) : 47.8  Vital Signs: Temp: 97.9 F (36.6 C) (04/24 0938) Temp Source: Oral (04/24 0938) BP: 135/61 mmHg (04/24 0938) Pulse Rate: 82 (04/24 0938) Intake/Output from previous day: 04/23 0701 - 04/24 0700 In: 647 [I.V.:60; Blood:587] Out: 7600 [Urine:7600] Intake/Output from this shift: Total I/O In: -  Out: 1400 [Urine:1400]  Labs:  Recent Labs  05/12/14 1044 05/13/14 0830  WBC 10.7* 7.5  HGB 8.0* 9.7*  PLT 235 227  CREATININE 1.20*  --    Estimated Creatinine Clearance: 39.9 mL/min (by C-G formula based on Cr of 1.2). No results for input(s): VANCOTROUGH, VANCOPEAK, VANCORANDOM, GENTTROUGH, GENTPEAK, GENTRANDOM, TOBRATROUGH, TOBRAPEAK, TOBRARND, AMIKACINPEAK, AMIKACINTROU, AMIKACIN in the last 72 hours.   Microbiology: Recent Results (from the past 720 hour(s))  Surgical pcr screen     Status: None   Collection Time: 04/24/14  2:40 PM  Result Value Ref Range Status   MRSA, PCR NEGATIVE NEGATIVE Final   Staphylococcus aureus NEGATIVE NEGATIVE Final    Comment:        The Xpert SA Assay (FDA approved for NASAL specimens in patients over 20 years of age), is one component of a comprehensive surveillance program.  Test performance has been validated by [facility] for patients greater than or equal to 71 year old year old. It is not intended to diagnose  infection nor to guide or monitor treatment.       Assessment: 69 YOF s/p spinal surgery with drain placement to continue on vancomycin for surgical prophylaxis.  Patient's SCr has a slight bump but his UOP is excellent.  Vanc 4/21 >>   Goal of Therapy:  Vancomycin trough level 10-15 mcg/ml   Plan:  - Continue vanc 1250mg  IV Q24H - Monitor renal fxn, clinical progress, abx LOT to determine when to obtain VT (?4/26) - F/U CBGs    Rhonda Clayton D. Mina Marble, PharmD, BCPS Pager:  [phone] 05/13/2014, 1:22 PM

## 2014-05-13 NOTE — Progress Notes (Addendum)
Physical Therapy Treatment Patient Details Name: Rhonda Clayton MRN: [id number] DOB: June 22, 1945 Today's Date: 05/13/2014    History of Present Illness 69 yo female s/p x2 surgeries by Dr Vertell Limber for L3-4 laminectomy pedicle screws  & L4-5 L5- S1 ALIF with peek cages PMH: esophageal stricture, HTN, DJD, stress incontinence, DM2, RBBB, BIL torn rotator cuff    PT Comments    Continuing progress with functional mobility, able to stand and take a few steps today and sit OOB for a while; Dr. Vertell Limber came in during session and seemed pleased with pt's posture and progress  Follow Up Recommendations  SNF;Supervision/Assistance - 24 hour     Equipment Recommendations  Rolling walker with 5" wheels;3in1 (PT)    Recommendations for Other Services       Precautions / Restrictions Precautions Precautions: Back Required Braces or Orthoses: Spinal Brace Spinal Brace:  (as of 4/22, Brian, [place] gave the OK to don in sitting)    Mobility  Bed Mobility Overal bed mobility: Needs Assistance Bed Mobility: Rolling;Sidelying to Sit Rolling: Mod assist Sidelying to sit: +2 for physical assistance;Mod assist       General bed mobility comments: Cues for technique and heavy mod assist to elevate trunk sidelying to sit  Transfers Overall transfer level: Needs assistance Equipment used: Rolling walker (2 wheeled) Transfers: Sit to/from Stand Sit to Stand: Mod assist;+2 safety/equipment         General transfer comment: Cues for sequence, hand placement and technique; she did require some anti-gravity boosting with sit to stand  Ambulation/Gait Ambulation/Gait assistance: Min assist;+2 safety/equipment Ambulation Distance (Feet): 5 Feet Assistive device: Rolling walker (2 wheeled) Gait Pattern/deviations: Decreased step length - right;Decreased step length - left     General Gait Details: Cues to self-monitor for activity tolerance; pt seemed glad to be up   Stairs             Wheelchair Mobility    Modified Rankin (Stroke Patients Only)       Balance     Sitting balance-Leahy Scale: Fair       Standing balance-Leahy Scale: Poor (dependent on UE support)                      Cognition Arousal/Alertness: Awake/alert Behavior During Therapy: WFL for tasks assessed/performed Overall Cognitive Status: Within Functional Limits for tasks assessed       Memory: Decreased recall of precautions              Exercises      General Comments General comments (skin integrity, edema, etc.): Assisted with hygeine and wiping in standing      Pertinent Vitals/Pain Pain Assessment: Faces Faces Pain Scale: Hurts even more Pain Location: back and abdomen Pain Descriptors / Indicators: Aching Pain Intervention(s): Monitored during session;Repositioned    Home Living                      Prior Function            PT Goals (current goals can now be found in the care plan section) Acute Rehab PT Goals Patient Stated Goal: to get back to doing independent task PT Goal Formulation: With patient Time For Goal Achievement: 05/25/14 Potential to Achieve Goals: Good Progress towards PT goals: Progressing toward goals    Frequency  Min 5X/week    PT Plan Current plan remains appropriate    Co-evaluation  End of Session Equipment Utilized During Treatment: Back brace Activity Tolerance: Patient tolerated treatment well Patient left: in chair;with call bell/phone within reach;with nursing/sitter in room;with chair alarm set     Time: [id number] PT Time Calculation (min) (ACUTE ONLY): 21 min  Charges:  ($Gait Training: 8-22 mins),  This was entered in error; the correct charge is $Therapeutic Activity: 8-22 mins (HG 05/14/14) $Therapeutic Activity: 8-22 mins                    G Codes:      Quin Hoop 05/13/2014, 12:31 PM  Roney Marion, [place] Pager  [phone] Office [phone]

## 2014-05-14 ENCOUNTER — Inpatient Hospital Stay (HOSPITAL_COMMUNITY): Admission: RE | Admit: 2014-05-14 | Payer: PPO | Source: Ambulatory Visit | Admitting: Neurosurgery

## 2014-05-14 ENCOUNTER — Encounter (HOSPITAL_COMMUNITY): Admission: RE | Payer: Self-pay | Source: Ambulatory Visit

## 2014-05-14 ENCOUNTER — Encounter (HOSPITAL_COMMUNITY): Payer: Self-pay | Admitting: Neurosurgery

## 2014-05-14 LAB — GLUCOSE, CAPILLARY
Glucose-Capillary: 57 mg/dL — ABNORMAL LOW (ref 70–99)
Glucose-Capillary: 74 mg/dL (ref 70–99)
Glucose-Capillary: 379 mg/dL — ABNORMAL HIGH (ref 70–99)
Glucose-Capillary: 385 mg/dL — ABNORMAL HIGH (ref 70–99)
Glucose-Capillary: 194 mg/dL — ABNORMAL HIGH (ref 70–99)
Glucose-Capillary: 201 mg/dL — ABNORMAL HIGH (ref 70–99)

## 2014-05-14 SURGERY — ANTERIOR LATERAL LUMBAR FUSION 3 LEVELS
Anesthesia: General

## 2014-05-14 NOTE — Progress Notes (Signed)
Pt Foley was D/C at 0800, pt has since voided x2

## 2014-05-14 NOTE — Progress Notes (Signed)
Physical Therapy Treatment Patient Details Name: Rhonda Clayton MRN: [id number] DOB: 1945/08/14 Today's Date: 05/14/2014    History of Present Illness 69 yo female s/p x2 surgeries by Dr Vertell Limber for L3-4 laminectomy pedicle screws  & L4-5 L5- S1 ALIF with peek cages PMH: esophageal stricture, HTN, DJD, stress incontinence, DM2, RBBB, BIL torn rotator cuff    PT Comments    Patient progressing well with mobility. Improved ambulation distance today with encouragement. Continues to require cues to self monitor activity tolerance. Pt with + nausea and light headedness during session. Will continue to progress as tolerated. Encouraged ambulation to bathroom with RN.   Follow Up Recommendations  SNF;Supervision/Assistance - 24 hour     Equipment Recommendations  Rolling walker with 5" wheels;3in1 (PT)    Recommendations for Other Services       Precautions / Restrictions Precautions Precautions: Back;Fall Precaution Booklet Issued: No Precaution Comments: educated on back precautions Required Braces or Orthoses: Spinal Brace Spinal Brace: Thoracolumbosacral orthotic;Applied in sitting position;Lumbar corset Restrictions Weight Bearing Restrictions: No    Mobility  Bed Mobility               General bed mobility comments: Sitting in chair upon PT arrival.   Transfers Overall transfer level: Needs assistance Equipment used: Rolling walker (2 wheeled) Transfers: Sit to/from Stand Sit to Stand: Min assist         General transfer comment: Min A to rise from chair. Good demo of hand placement.   Ambulation/Gait Ambulation/Gait assistance: Min guard Ambulation Distance (Feet): 100 Feet Assistive device: Rolling walker (2 wheeled) Gait Pattern/deviations: Step-through pattern;Trunk flexed;Decreased step length - left;Decreased step length - right   Gait velocity interpretation: Below normal speed for age/gender General Gait Details: Safety cues as pt reporting  nausea/light headedness. Continues to self monitor for activity tolerance.    Stairs            Wheelchair Mobility    Modified Rankin (Stroke Patients Only)       Balance Overall balance assessment: Needs assistance Sitting-balance support: Feet supported;No upper extremity supported Sitting balance-Leahy Scale: Fair     Standing balance support: During functional activity Standing balance-Leahy Scale: Poor Standing balance comment: Relient on RW for support.                     Cognition Arousal/Alertness: Awake/alert Behavior During Therapy: WFL for tasks assessed/performed Overall Cognitive Status: Impaired/Different from baseline Area of Impairment: Problem solving             Problem Solving: Slow processing      Exercises      General Comments General comments (skin integrity, edema, etc.): Family present during session.       Pertinent Vitals/Pain Pain Assessment: 0-10 Pain Score: 4  Pain Location: back and BLEs Pain Descriptors / Indicators: Aching;Sore Pain Intervention(s): Monitored during session;Repositioned    Home Living                      Prior Function            PT Goals (current goals can now be found in the care plan section) Progress towards PT goals: Progressing toward goals    Frequency  Min 5X/week    PT Plan Current plan remains appropriate    Co-evaluation             End of Session Equipment Utilized During Treatment: Back brace;Gait belt Activity Tolerance: Patient tolerated treatment  well Patient left: in chair;with call bell/phone within reach;with family/visitor present;with chair alarm set     Time: [id number] PT Time Calculation (min) (ACUTE ONLY): 19 min  Charges:  $Gait Training: 8-22 mins                    G CodesCandy Sledge A 06/09/14, 11:05 AM Candy Sledge, PT, DPT [phone]

## 2014-05-14 NOTE — Clinical Social Work Note (Signed)
CSW reviewed chart, once pt is medically stable she will transition to [facility] CSW will continue to assist and follow.    [place], MSW, [place]

## 2014-05-14 NOTE — Progress Notes (Signed)

## 2014-05-14 NOTE — Progress Notes (Signed)
Subjective: Patient reports "I hurt some, but I can deal with it"  Objective: Vital signs in last 24 hours: Temp:  [97.8 F (36.6 C)-98.8 F (37.1 C)] 98.2 F (36.8 C) (04/25 0246) Pulse Rate:  [77-86] 79 (04/25 0246) Resp:  [18-20] 18 (04/25 0246) BP: (120-135)/(49-68) 135/68 mmHg (04/25 0246) SpO2:  [97 %-100 %] 98 % (04/25 0246)  Intake/Output from previous day: 04/24 0701 - 04/25 0700 In: -  Out: 5200 [Urine:5200] Intake/Output this shift:    Alert, conversant. MAEW, good strength BLE. Incision without erythema, swelling, or drainage beneath honeycomb drsg. Foley patent (retention over weekend).   Lab Results:  Recent Labs  05/12/14 1044 05/13/14 0830  WBC 10.7* 7.5  HGB 8.0* 9.7*  HCT 23.1* 28.7*  PLT 235 227   BMET  Recent Labs  05/12/14 1044  NA 127*  K 4.3  CL 98  CO2 21  GLUCOSE 257*  BUN 13  CREATININE 1.20*  CALCIUM 7.5*    Studies/Results: No results found.  Assessment/Plan: Improving   LOS: 5 days  Per DrStern, d/c Foley catheter; continue to mobilize with PT. Reassured about self-positioning in bed to decrease pressure points. Expect SNF tomorrow if she does well without Foley & mobilizes today.   Verdis Prime 05/14/2014, 7:31 AM

## 2014-05-14 NOTE — Progress Notes (Addendum)
Inpatient Diabetes Program Recommendations  AACE/ADA: New Consensus Statement on Inpatient Glycemic Control (2013)  Target Ranges:  Prepandial:   less than 140 mg/dL      Peak postprandial:   less than 180 mg/dL (1-2 hours)      Critically ill patients:  140 - 180 mg/dL   Consult received for widely fluctuating glucose values. Have reviewed glycemic control therapy over the past 2 days-Pt was ordered cho mod diet yesterday at mid-day and started on meal coverage of 4 units tidwc. The hypoglycemia does not appear to have been result of insulin therapy used, but the glyburide/metformin (glucovance) was given yesterday at supper in addition to meal coverage. Would recommend not using this oral agent while here as po intake may not be adequate for both sulfonylurea and meal coverage. ( In addition pt received 2 units RBC's on 4/23 which definitely would influence results of HgbA1C). Would use only meal coverage and correction insulin while here.  Thank you Rosita Kea, RN, MSN, CDE  Diabetes Inpatient Program Office: [phone] Pager: [phone] 8:00 am to 5:00 pm

## 2014-05-15 LAB — POCT I-STAT EG7
Acid-base deficit: 2 mmol/L (ref 0.0–2.0)
Acid-base deficit: 3 mmol/L — ABNORMAL HIGH (ref 0.0–2.0)
Bicarbonate: 21.9 mEq/L (ref 20.0–24.0)
Bicarbonate: 22.5 mEq/L (ref 20.0–24.0)
Calcium, Ion: 1.13 mmol/L (ref 1.13–1.30)
Calcium, Ion: 1.11 mmol/L — ABNORMAL LOW (ref 1.13–1.30)
HCT: 23 % — ABNORMAL LOW (ref 36.0–46.0)
HEMATOCRIT: 28 % — AB (ref 36.0–46.0)
HEMOGLOBIN: 9.5 g/dL — AB (ref 12.0–15.0)
Hemoglobin: 7.8 g/dL — ABNORMAL LOW (ref 12.0–15.0)
O2 SAT: 87 %
O2 Saturation: 90 %
PO2 VEN: 60 mmHg — AB (ref 30.0–45.0)
POTASSIUM: 3.6 mmol/L (ref 3.5–5.1)
Patient temperature: 36
Potassium: 3.6 mmol/L (ref 3.5–5.1)
SODIUM: 132 mmol/L — AB (ref 135–145)
SODIUM: 129 mmol/L — AB (ref 135–145)
TCO2: 23 mmol/L (ref 0–100)
TCO2: 24 mmol/L (ref 0–100)
pCO2, Ven: 33.1 mmHg — ABNORMAL LOW (ref 45.0–50.0)
pCO2, Ven: 40.9 mmHg — ABNORMAL LOW (ref 45.0–50.0)
pH, Ven: 7.423 — ABNORMAL HIGH (ref 7.250–7.300)
pH, Ven: 7.348 — ABNORMAL HIGH (ref 7.250–7.300)
pO2, Ven: 48 mmHg — ABNORMAL HIGH (ref 30.0–45.0)

## 2014-05-15 LAB — GLUCOSE, CAPILLARY
GLUCOSE-CAPILLARY: 311 mg/dL — AB (ref 70–99)
Glucose-Capillary: 148 mg/dL — ABNORMAL HIGH (ref 70–99)

## 2014-05-15 LAB — POCT I-STAT 4, (NA,K, GLUC, HGB,HCT)
GLUCOSE: 302 mg/dL — AB (ref 70–99)
HEMATOCRIT: 29 % — AB (ref 36.0–46.0)
Hemoglobin: 9.9 g/dL — ABNORMAL LOW (ref 12.0–15.0)
POTASSIUM: 4.2 mmol/L (ref 3.5–5.1)
Sodium: 130 mmol/L — ABNORMAL LOW (ref 135–145)

## 2014-05-15 LAB — POCT I-STAT GLUCOSE
Glucose, Bld: 259 mg/dL — ABNORMAL HIGH (ref 70–99)
Operator id: 305651

## 2014-05-15 NOTE — Progress Notes (Signed)
Pharmacy note: vancomycin  69 yo female s/p spinal surgery on vancomycin for surgical prophylaxis and this has been continued with a drain in place. Per RN the drain has been removed  -Spoke with Dr. Vertell Limber and ok to discontinue vancomycin  Plan -Discontinue vancomycin -Will sign off, please contact pharmacy with any other needs  Hildred Laser, Pharm D 05/15/2014 1:57 PM

## 2014-05-15 NOTE — Progress Notes (Signed)
Subjective: Patient reports "I feel pretty good, just a little sore"  Objective: Vital signs in last 24 hours: Temp:  [97.7 F (36.5 C)-98.2 F (36.8 C)] 97.7 F (36.5 C) (04/26 0615) Pulse Rate:  [59-90] 65 (04/26 0615) Resp:  [16-20] 16 (04/26 0615) BP: (102-133)/(51-91) 131/55 mmHg (04/26 0615) SpO2:  [97 %-100 %] 97 % (04/26 0615) Weight:  [81.1 kg (178 lb 12.7 oz)] 81.1 kg (178 lb 12.7 oz) (04/26 0736)  Intake/Output from previous day:   Intake/Output this shift:    Alert, conversant, sitting in chair. Good strength BLE. incision without erythema, swelling, or drainage beneath honeycomb drsg.   Lab Results:  Recent Labs  05/12/14 1044 05/13/14 0830  WBC 10.7* 7.5  HGB 8.0* 9.7*  HCT 23.1* 28.7*  PLT 235 227   BMET  Recent Labs  05/12/14 1044  NA 127*  K 4.3  CL 98  CO2 21  GLUCOSE 257*  BUN 13  CREATININE 1.20*  CALCIUM 7.5*    Studies/Results: No results found.  Assessment/Plan: improving   LOS: 6 days  Per DrStern, d/c IV, d/c to SNF [facility]). Rx's Dilaudid 2mg  and Robaxin 500mg  to chart. Pt will stop Vancomycin & resume pre-hospital diabetes mgt. She agrees to contact office for 3-4 week f/u visit.    Verdis Prime 05/15/2014, 8:28 AM

## 2014-05-15 NOTE — Clinical Social Work Placement (Signed)
   CLINICAL SOCIAL WORK PLACEMENT  NOTE  Date:  05/15/2014  Patient Details  Name: Rhonda Clayton MRN: [id number] Date of Birth: [birth year]  Clinical Social Work is seeking post-discharge placement for this patient at the [place] level of care (*CSW will initial, date and re-position this form in  chart as items are completed):  Yes   Patient/family provided with [place] Work Department's list of facilities offering this level of care within the geographic area requested by the patient (or if unable, by the patient's family).  Yes   Patient/family informed of their freedom to choose among providers that offer the needed level of care, that participate in Medicare, Medicaid or managed care program needed by the patient, have an available bed and are willing to accept the patient.  Yes   Patient/family informed of [facility]'s ownership interest in [facility] and [facility], as well as of the fact that they are under no obligation to receive care at these facilities.  PASRR submitted to EDS on 05/11/14     PASRR number received on 05/11/14     Existing PASRR number confirmed on       FL2 transmitted to all facilities in geographic area requested by pt/family on 05/11/14     FL2 transmitted to all facilities within larger geographic area on       Patient informed that his/her managed care company has contracts with or will negotiate with certain facilities, including the following:        Yes   Patient/family informed of bed offers received.  Patient chooses bed at [facility]     Physician recommends and patient chooses bed at      Patient to be transferred to [facility] on 05/15/14.  Patient to be transferred to facility by [facility]      Patient family notified on 05/15/14 of transfer.  Name of family member notified:  Pt and son's Nishitha Duddy      PHYSICIAN Please sign FL2, Please prepare priority discharge summary,  including medications     Additional Comment:    _______________________________________________ Greta Doom, LCSW 05/15/2014, 9:54 AM

## 2014-05-15 NOTE — Progress Notes (Signed)
Occupational Therapy Treatment Patient Details Name: Rhonda Clayton MRN: [id number] DOB: Jun 28, 1945 Today's Date: 05/15/2014    History of present illness 69 yo female s/p x2 surgeries by Dr Vertell Limber for L3-4 laminectomy pedicle screws  & L4-5 L5- S1 ALIF with peek cages PMH: esophageal stricture, HTN, DJD, stress incontinence, DM2, RBBB, BIL torn rotator cuff   OT comments  Pt with great improvements from last session and ambulated > 100 ft this session and complete sink level adls. Pt cautioned on toilet void of bladder positioning and will need to further consult her MD about methods used. Pt bending at waist to help fully empty bladder.    Follow Up Recommendations  SNF    Equipment Recommendations  Other (comment)    Recommendations for Other Services      Precautions / Restrictions Precautions Precautions: Back;Fall Precaution Comments: able to recall Required Braces or Orthoses: Spinal Brace Spinal Brace: Thoracolumbosacral orthotic;Applied in sitting position;Lumbar corset       Mobility Bed Mobility Overal bed mobility: Needs Assistance Bed Mobility: Sit to Supine       Sit to supine: Supervision   General bed mobility comments: cues to remove brace  Transfers Overall transfer level: Needs assistance Equipment used: Rolling walker (2 wheeled) Transfers: Sit to/from Stand Sit to Stand: Supervision              Balance                                   ADL   Eating/Feeding: Independent   Grooming: Wash/dry hands;Wash/dry face;Oral care;Min guard;Standing           Upper Body Dressing : Minimal assistance (doff brace)       Toilet Transfer: Min guard;Ambulation;RW;BSC Toilet Transfer Details (indicate cue type and reason): cues for positioning and avoid bending. pt bending on toilet to help empty bladder. OT instructing patient to consult MD further and question if lifting and pushing on bladder in sitting could achieve same  results.  Toileting- Clothing Manipulation and Hygiene: Total assistance Toileting - Clothing Manipulation Details (indicate cue type and reason): unable to complete      Functional mobility during ADLs: Min guard;Rolling walker General ADL Comments: Pt complete doff brace and educated on leaving straps hooked on one side      Vision                     Perception     Praxis      Cognition   Behavior During Therapy: WFL for tasks assessed/performed Overall Cognitive Status: Within Functional Limits for tasks assessed                       Extremity/Trunk Assessment               Exercises     Shoulder Instructions       General Comments      Pertinent Vitals/ Pain       Pain Assessment: 0-10 Pain Score: 4  Pain Location: back Pain Descriptors / Indicators: Aching Pain Intervention(s): Premedicated before session  Home Living                                          Prior Functioning/Environment  Frequency Min 2X/week     Progress Toward Goals  OT Goals(current goals can now be found in the care plan section)  Progress towards OT goals: Progressing toward goals  Acute Rehab OT Goals Patient Stated Goal: to get better OT Goal Formulation: With patient/family Time For Goal Achievement: 05/25/14 Potential to Achieve Goals: Good ADL Goals Pt Will Perform Grooming: with set-up;with supervision;standing Pt Will Perform Upper Body Bathing: with min assist;sitting Pt Will Transfer to Toilet: with min assist;bedside commode Additional ADL Goal #1: Pt will don brace mod I at EOB Additional ADL Goal #2: Pt will complete bed mobility MIN (A) with hob < 20 degrees no bed rails  Plan Discharge plan remains appropriate    Co-evaluation                 End of Session Equipment Utilized During Treatment: Rolling walker;Back brace   Activity Tolerance Patient tolerated treatment well   Patient Left  in bed;with call bell/phone within reach;with bed alarm set;with family/visitor present   Nurse Communication Mobility status;Precautions        Time: [id number] OT Time Calculation (min): 44 min  Charges: OT General Charges $OT Visit: 1 Procedure OT Treatments $Self Care/Home Management : 38-52 mins  Parke Poisson B 05/15/2014, 2:14 PM  Pager: [phone]

## 2014-05-15 NOTE — Discharge Summary (Signed)
Physician Discharge Summary  [name] MRN: [id number] DOB/AGE: 07-22-1945 69 y.o.  Admit date: 05/09/2014 Discharge date: 05/15/2014  Admission Diagnoses: Idiopathic Scoliosis of ThoracoLumbar Region, Low back pain, Unspecified radiculopathy lumbar region, herniated lumbar disc with spinal stenosis   Discharge Diagnoses: Idiopathic Scoliosis of ThoracoLumbar Region, Low back pain, Unspecified radiculopathy lumbar region, herniated lumbar disc with spinal stenosis s/p Stage 1:Lumbar four-five, Lumbar five-Sacral one Anterior lumbar interbody fusion with Dr. Donnetta Hutching for approach  (N/A) Lumbar three-four, Lumbar four-five Anterior lateral lumbar fusion (Right) with PEEK cages, anterior lumbar fixation ABDOMINAL EXPOSURE (N/A) Stage 2: T 10 to Ileum pedicle screw fixation with  Laminectomy L3-4 and microdiscectomy with posterolateral arthrodesis with Autograft, allograft T 10 to pelvis   (N/A) - Stage 2: Percutaneous Pedicle Screws T10 to Ileum; Laminectomy L3-4   Active Problems:   Other secondary scoliosis, thoracolumbar region   Discharged Condition: good  Hospital Course: Rhonda Clayton was admitted for surgery with dx scoliosis, radiculopathy, and 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  Surgery was performed in two stages, and she recovered nicely from each.  She did require 2 units of blood day 1 post-op.  Her progress has been steady since.   Consults: vascular surgery   Significant Diagnostic Studies: radiology: CT scan: intra-operative CT, intra-operative x-rays  Treatments: surgery: Stage 1:Lumbar four-five, Lumbar five-Sacral one Anterior lumbar interbody fusion with Dr. Donnetta Hutching for approach  (N/A) Lumbar three-four, Lumbar four-five Anterior lateral lumbar fusion (Right) with PEEK cages, anterior lumbar fixation ABDOMINAL EXPOSURE (N/A) Stage 2: T 10 to Ileum pedicle screw fixation with  Laminectomy L3-4 and microdiscectomy with posterolateral arthrodesis with Autograft, allograft T 10 to pelvis    (N/A) - Stage 2: Percutaneous Pedicle Screws T10 to Ileum; Laminectomy L3-4    Discharge Exam: Blood pressure 131/55, pulse 65, temperature 97.7 F (36.5 C), temperature source Oral, resp. rate 16, height 5\' 1"  (1.549 m), weight 81.1 kg (178 lb 12.7 oz), SpO2 97 %. Alert, conversant, sitting in chair. Good strength BLE. incision without erythema, swelling, or drainage beneath honeycomb drsg. Pain controlled with po meds at present.    Disposition:D/C to SNF [facility]).  Rx's Dilaudid 2mg  and Robaxin 500mg  to chart. Pt will stop Vancomycin & resume pre-hospital diabetes mgt. She agrees to contact office for 3-4 week f/u visit.       Medication List    ASK your doctor about these medications        amLODipine 5 MG tablet  Commonly known as:  NORVASC  Take 5 mg by mouth daily.     clobetasol ointment 0.05 %  Commonly known as:  TEMOVATE  Apply 1 application topically at bedtime.     gabapentin 100 MG capsule  Commonly known as:  NEURONTIN  Take 100 mg by mouth at bedtime.     glyBURIDE-metformin 5-500 MG per tablet  Commonly known as:  GLUCOVANCE  Take 2 tablets by mouth 2 (two) times daily.     hydrochlorothiazide 12.5 MG capsule  Commonly known as:  MICROZIDE  Take 12.5 mg by mouth daily.     HYDROmorphone 2 MG tablet  Commonly known as:  DILAUDID  Take 2 mg by mouth every 6 (six) hours as needed for severe pain.     levothyroxine 100 MCG tablet  Commonly known as:  SYNTHROID, LEVOTHROID  Take 100 mcg by mouth daily before breakfast. Takes Synthroid name brand.     levothyroxine 112 MCG tablet  Commonly known as:  SYNTHROID, LEVOTHROID  Take 100 mcg  by mouth daily.     NON FORMULARY  Place 1 mL vaginally 2 (two) times a week. Estradiol 0.2mg  / ml     OVER THE COUNTER MEDICATION  Apply 1 application topically as needed (for skin). Emu Aid     ramipril 10 MG tablet  Commonly known as:  ALTACE  Take 10 mg by mouth daily.     VITAMIN D PO  Take 1 capsule  by mouth 2 (two) times daily.         Signed: Verdis Prime 05/15/2014, 8:34 AM

## 2014-05-16 ENCOUNTER — Non-Acute Institutional Stay (SKILLED_NURSING_FACILITY): Payer: PPO | Admitting: Adult Health

## 2014-05-16 ENCOUNTER — Encounter: Payer: Self-pay | Admitting: Adult Health

## 2014-05-16 DIAGNOSIS — G629 Polyneuropathy, unspecified: Secondary | ICD-10-CM | POA: Diagnosis not present

## 2014-05-16 DIAGNOSIS — K1379 Other lesions of oral mucosa: Secondary | ICD-10-CM

## 2014-05-16 DIAGNOSIS — D62 Acute posthemorrhagic anemia: Secondary | ICD-10-CM | POA: Diagnosis not present

## 2014-05-16 DIAGNOSIS — E039 Hypothyroidism, unspecified: Secondary | ICD-10-CM | POA: Diagnosis not present

## 2014-05-16 DIAGNOSIS — K5900 Constipation, unspecified: Secondary | ICD-10-CM | POA: Diagnosis not present

## 2014-05-16 DIAGNOSIS — I10 Essential (primary) hypertension: Secondary | ICD-10-CM | POA: Diagnosis not present

## 2014-05-16 DIAGNOSIS — M4155 Other secondary scoliosis, thoracolumbar region: Secondary | ICD-10-CM

## 2014-05-16 DIAGNOSIS — E43 Unspecified severe protein-calorie malnutrition: Secondary | ICD-10-CM | POA: Diagnosis not present

## 2014-05-16 DIAGNOSIS — Z79890 Hormone replacement therapy (postmenopausal): Secondary | ICD-10-CM | POA: Diagnosis not present

## 2014-05-16 DIAGNOSIS — E118 Type 2 diabetes mellitus with unspecified complications: Secondary | ICD-10-CM

## 2014-05-16 NOTE — Progress Notes (Signed)
[name], female   DOB: [birth year], 69 y.o.   MRN: [id number]   05/16/2014  Facility:  Nursing Home Location:  [place] Room Number: [id number] LEVEL OF CARE:  SNF (31)   Chief Complaint  Patient presents with  . Hospitalization Follow-up    Scoliosis S/P surgery, protein-calorie malnutrition, hypertension, neuropathy, diabetes mellitus, hypothyroidism, HRT, mouth sore, constipation and anemia    HISTORY OF PRESENT ILLNESS:  This is a 69 year old female who has been admitted to [facility] on 05/15/15 from [facility] with Scoliosis, radiculopathy and HNP L3-3 S/P surgery with 2 stages. Stage1: Lumbar 4-5, L5- S1 anterior lumbar interbody fusion, lumbar 3-4, lumbar 4-5 anterior lateral lumbar fusion (right) with PEEK cages, anterior lumbar fixation; stage 2: T 10 2 ileum pedicle screw fixation with laminectomy L3-4 and microdiscectomy with posterolateral arthrodesis with autograft, allograft T 10 to pelvis - stage 2: Percutaneous pedicle screws T10 to ileum; Laminectomy L3-4.  She has PMH of esophageal stricture, hypertension, hyperlipidemia, BBB, hypothyroidism, venous insufficiency, cystocele, rectocele, stress incontinence and diabetes mellitus.  She is seen in her room whlile lying in bed. She complains of constipation and oral sore.  She has been admitted for a short-term rehabilitation.  PAST MEDICAL HISTORY:  Past Medical History  Diagnosis Date  . Esophageal stricture   . Essential hypertension   . Hyperlipidemia   . Hypothyroidism   . Osteopenia   . DJD (degenerative joint disease)   . Venous insufficiency   . Cystocele   . Rectocele   . Stress incontinence   . Torn rotator cuff     Bilateral  . Type 2 diabetes mellitus   . Anemia   . Lichen sclerosus   . RBBB with left anterior fascicular block     CURRENT MEDICATIONS: Reviewed per MAR/see medication list  Allergies  Allergen Reactions  . Coffee Flavor  Other (See Comments)    Sick on stomach, sneezing, backed up  . Cephalexin Other (See Comments)    Yeast infection  . Ciprofloxacin Other (See Comments)    tendinitis  . Keflex [Cephalexin] Other (See Comments)    As long as taking Diflucan with it  . Oatmeal Other (See Comments)    Sneezing and drainage  . Sulfa Antibiotics Other (See Comments)    Yeast infection     REVIEW OF SYSTEMS:  GENERAL: no change in appetite, no fatigue, no weight changes, no fever, chills or weakness MOUTH:   Mouth sore RESPIRATORY: no cough, SOB, DOE, wheezing, hemoptysis CARDIAC: no chest pain, edema or palpitations GI: no abdominal pain, diarrhea, heart burn, nausea or vomiting, +constipation  PHYSICAL EXAMINATION  GENERAL: no acute distress, normal body habitus SKIN:  Surgical incision with honecomb dressing on inner LLQ, outer RLQ and midback surgical site, dry, no erythema EYES: conjunctivae normal, sclerae normal, normal eye lids MOUTH:  Tongue has no lesion, slight redness noted NECK: supple, trachea midline, no neck masses, no thyroid tenderness, no thyromegaly LYMPHATICS: no LAN in the neck, no supraclavicular LAN RESPIRATORY: breathing is even & unlabored, BS CTAB CARDIAC: RRR, no murmur,no extra heart sounds, no edema GI: abdomen soft, normal BS, no masses, no tenderness, no hepatomegaly, no splenomegaly EXTREMITIES: able to move all 4 extremities PSYCHIATRIC: the patient is alert & oriented to person, affect & behavior appropriate  LABS/RADIOLOGY: Labs reviewed: Basic Metabolic Panel:  Recent Labs  04/24/14 1427 05/09/14 0831  05/10/14 1033 05/10/14 1038 05/10/14 1246 05/12/14 1044  NA  136 134*  < > 129*  --  130* 127*  K 4.3 3.5  < > 3.6  --  4.2 4.3  CL 100 99  --   --   --   --  98  CO2 28  --   --   --   --   --  21  GLUCOSE 183* 190*  --   --  259* 302* 257*  BUN 24* 22  --   --   --   --  13  CREATININE 1.14* 1.10  --   --   --   --  1.20*  CALCIUM 9.4  --   --    --   --   --  7.5*  < > = values in this interval not displayed. Liver Function Tests:  Recent Labs  05/12/14 1044  AST 39*  ALT 25  ALKPHOS 76  BILITOT 0.8  PROT 5.1*  ALBUMIN 2.1*   CBC:  Recent Labs  04/24/14 1427  05/10/14 1246 05/12/14 1044 05/13/14 0830  WBC 5.9  --   --  10.7* 7.5  NEUTROABS  --   --   --  8.7*  --   HGB 11.1*  < > 9.9* 8.0* 9.7*  HCT 32.7*  < > 29.0* 23.1* 28.7*  MCV 82.6  --   --  81.9 83.4  PLT 368  --   --  235 227  < > = values in this interval not displayed. CBG:  Recent Labs  05/14/14 2141 05/15/14 0635 05/15/14 1133  GLUCAP 201* 148* 311*    Dg Lumbar Spine 2-3 Views  05/09/2014   CLINICAL DATA:  Lumbar discectomy and fusion  EXAM: LUMBAR SPINE - 2-3 VIEW  COMPARISON:  Earlier today  FINDINGS: Four C arm images show the lateral discectomy and fusion at L2-3 and L3-4. Previous anterior surgeries at L4-5 and L5-S1. No radiographically detectable complication.  IMPRESSION: Lateral discectomy and fusion at L2-3 and L3-4.   Electronically Signed   By: Nelson Chimes M.D.   On: 05/09/2014 20:12   Dg C-arm Gt 120 Min  05/09/2014   CLINICAL DATA:  69 year old female undergoing L4-S1 anterior lumbar interbody fusion and L2-L4 extreme lateral interbody fusion  EXAM: DG C-ARM GT 120 MIN  COMPARISON:  Preoperative lumbar spine MRI 03/10/2014  FINDINGS: For intraoperative spot images demonstrate interbody grafts at at L2-L3 and L3-L4 and surgical changes of anterior fusion with interbody grafts and vertebral body screws at L4-L5 and L5-S1. No evidence of immediate complication.  IMPRESSION: Surgical changes as above.   Electronically Signed   By: Jacqulynn Cadet M.D.   On: 05/09/2014 20:16   Dg Or Local Abdomen  05/09/2014   CLINICAL DATA:  Anterior lumbar fusion.  Instrument count.  EXAM: OR LOCAL ABDOMEN  COMPARISON:  02/07/2014  FINDINGS: Anterior and interbody fusion L4-5 and L5-S1. The hardware appears to be well-positioned. Surgical clips  overlying the left iliac wing and anterior to the sacrum related to recent surgical exposure. No retained instruments.  Moderate lumbar levoscoliosis.  IMPRESSION: Expected postoperative findings.  No retained instrument.  These results were called by telephone at the time of interpretation on 05/09/2014 at 4:10 pm to [facility] RN who verbally acknowledged these results.   Electronically Signed   By: Franchot Gallo M.D.   On: 05/09/2014 16:10    ASSESSMENT/PLAN:  Scoliosis S/P surgery - for rehabilitation; follow-up with Dr. Antonieta Pert, in 3-4 weeks; increase Dilaudid 2 mg 1 tab  by mouth every 4 hours when necessary for pain; and Robaxin 500 mg by mouth every 6 hours when necessary for muscle spasm Neuropathy - continue gabapentin 100 mg by mouth daily at bedtime Diabetes mellitus, type II - hgbA1c 8.9; continue Glucovance 5-500 mg 2 tabs by mouth twice a day Hypertension - well controlled; continue amlodipine 5 mg by mouth daily, Ramipril 10 mg PO Q D and HCTZ 12.5 mg by mouth daily Hypothyroidism - continue levothyroxine 112 g 1 tab by mouth daily HRT - continue estradiol 0.2 mg/ML 1 mL  Vaginally 2 times/week Mouth sore - start Magic mouthwash 10 ML by mouth 4 times a day 2 weeks then when necessary  Constipation - continue senna S2 tabs by mouth twice a day and Dulcolax suppository 1 per rectum daily when necessary Anemia, acute blood loss - S/P transfusion of 2 units PRBC in hospital; hgb 9.7 Protein calorie malnutrition, severe - albumin 2.1; RD consult   Goals of care:  Short-term rehabilitation   Labs/test ordered:  CBC, BMP, tsh  Spent 50 minutes in patient care.   [facility], NP  [employer] [phone]

## 2014-05-18 ENCOUNTER — Non-Acute Institutional Stay (SKILLED_NURSING_FACILITY): Payer: PPO | Admitting: Internal Medicine

## 2014-05-18 DIAGNOSIS — E118 Type 2 diabetes mellitus with unspecified complications: Secondary | ICD-10-CM | POA: Diagnosis not present

## 2014-05-18 DIAGNOSIS — K5901 Slow transit constipation: Secondary | ICD-10-CM | POA: Diagnosis not present

## 2014-05-18 DIAGNOSIS — I10 Essential (primary) hypertension: Secondary | ICD-10-CM | POA: Diagnosis not present

## 2014-05-18 DIAGNOSIS — D62 Acute posthemorrhagic anemia: Secondary | ICD-10-CM

## 2014-05-18 DIAGNOSIS — M4155 Other secondary scoliosis, thoracolumbar region: Secondary | ICD-10-CM

## 2014-05-18 DIAGNOSIS — M792 Neuralgia and neuritis, unspecified: Secondary | ICD-10-CM

## 2014-05-18 DIAGNOSIS — E46 Unspecified protein-calorie malnutrition: Secondary | ICD-10-CM | POA: Insufficient documentation

## 2014-05-18 DIAGNOSIS — E039 Hypothyroidism, unspecified: Secondary | ICD-10-CM | POA: Insufficient documentation

## 2014-05-18 NOTE — Progress Notes (Signed)
[name], female   DOB: Feb 01, 1945, 69 y.o.   MRN: [id number]     [facility]   PCP: Tivis Ringer, MD  Code Status: full code  Allergies  Allergen Reactions  . Coffee Flavor Other (See Comments)    Sick on stomach, sneezing, backed up  . Cephalexin Other (See Comments)    Yeast infection  . Ciprofloxacin Other (See Comments)    tendinitis  . Keflex [Cephalexin] Other (See Comments)    As long as taking Diflucan with it  . Oatmeal Other (See Comments)    Sneezing and drainage  . Sulfa Antibiotics Other (See Comments)    Yeast infection    Chief Complaint  Patient presents with  . 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 She underwent surgery in 2 stages. In stage 1 she had L4-5, L5-S1 anterior lumbar interbody fusion; L3-4, L4-5 antero-lateral lumbar fusion (Right) with PEEK cages and anterior lumbar fixation. In stage 2, she had T 10 to Ileum pedicle screw fixation with  Laminectomy L3-4 and microdiscectomy with posterolateral arthrodesis with Autograft, allograft T10 to pelvis. She is seen in her room today. She is in pain but feels her current regimen of pain medication and muscle relaxant have been helpful. She feels tired. She has ocassional nausea. Her appetite is fair. She had a bowel movement yesterday and one today- somewhat loose. She would like her laxative reduced. No other concerns. She has PMH of HTN, DM, hypothyroidism among others  Review of Systems:  Constitutional: positive for fatigue. Negative for fever, chills, diaphoresis.  HENT: Negative for headache, congestion, nasal discharge Eyes: Negative for eye pain, blurred vision, double vision and discharge.  Respiratory: Negative for cough, shortness of  breath and wheezing.   Cardiovascular: Negative for chest pain, palpitations, leg swelling.  Gastrointestinal: Negative for heartburn, nausea, vomiting, abdominal pain Genitourinary: Negative for dysuria Musculoskeletal: positive for back pain. Negative for falls Skin: Negative for itching, rash.  Neurological: Negative for numbness and tingling, focal weakness Psychiatric/Behavioral: Negative for depression.    Past Medical History  Diagnosis Date  . Esophageal stricture   . Essential hypertension   . Hyperlipidemia   . Hypothyroidism   . Osteopenia   . DJD (degenerative joint disease)   . Venous insufficiency   . Cystocele   . Rectocele   . Stress incontinence   . Torn rotator cuff     Bilateral  . Type 2 diabetes mellitus   . Anemia   . Lichen sclerosus   . RBBB with left anterior fascicular block    Past Surgical History  Procedure Laterality Date  . Cholecystectomy  1987  . Incontinence surgery    . Rectal surgery    . Anterior lumbar fusion N/A 05/09/2014    Procedure: Stage 1:Lumbar four-five, Lumbar five-Sacral one Anterior lumbar interbody fusion with Dr. Donnetta Hutching for approach ;  Surgeon: Erline Levine, MD;  Location: [place] [facility];  Service: Neurosurgery;  Laterality: N/A;  . Anterior lat lumbar fusion Right 05/09/2014    Procedure: Lumbar three-four, Lumbar four-five Anterior lateral lumbar fusion;  Surgeon: Erline Levine, MD;  Location: [place] [facility];  Service: Neurosurgery;  Laterality: Right;  . Abdominal exposure N/A 05/09/2014    Procedure: ABDOMINAL EXPOSURE;  Surgeon: Rosetta Posner, MD;  Location: [facility];  Service: Vascular;  Laterality: N/A;  . Lumbar percutaneous pedicle screw 3 level N/A 05/10/2014    Procedure: Stage 2: Percutaneous Pedicle Screws; Laminectomy L3-4  ;  Surgeon: Erline Levine, MD;  Location: [place] [facility];  Service: Neurosurgery;  Laterality: N/A;  Stage 2: Percutaneous Pedicle Screws T10 to Ileum; Laminectomy L3-4     Social History:    reports that she has never smoked. She has never used smokeless tobacco. 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 She reports that she does not drink alcohol.  Family History  Problem Relation Age of Onset  . Multiple myeloma Mother   . Cancer Maternal Aunt     Cancer in the mouth - dipped snuff  . Heart disease Brother   . Diabetes Father   . Colon polyps Brother   . Heart disease Maternal Uncle   . Heart disease Paternal Uncle   . Heart disease Paternal Grandfather     Medications: Patient's Medications  New Prescriptions   No medications on file  Previous Medications   AMLODIPINE (NORVASC) 5 MG TABLET    Take 5 mg by mouth daily.   CHOLECALCIFEROL (VITAMIN D PO)    Take 1 capsule by mouth 2 (two) times daily.   CLOBETASOL OINTMENT (TEMOVATE) 0.05 %    Apply 1 application topically at bedtime.   GABAPENTIN (NEURONTIN) 100 MG CAPSULE    Take 100 mg by mouth at bedtime.   GLYBURIDE-METFORMIN (GLUCOVANCE) 5-500 MG PER TABLET    Take 2 tablets by mouth 2 (two) times daily.   HYDROCHLOROTHIAZIDE (MICROZIDE) 12.5 MG CAPSULE    Take 12.5 mg by mouth daily.   HYDROMORPHONE (DILAUDID) 2 MG TABLET    Take 2 mg by mouth every 6 (six) hours as needed for severe pain.   LEVOTHYROXINE (SYNTHROID, LEVOTHROID) 112 MCG TABLET    Take 100 mcg by mouth daily.    NON FORMULARY    Place 1 mL vaginally 2 (two) times a week. Estradiol 0.24m / ml   OVER THE COUNTER MEDICATION    Apply 1 application topically as needed (for skin). Emu Aid   RAMIPRIL (ALTACE) 10 MG TABLET    Take 10 mg by mouth daily.  Modified Medications   No medications on file  Discontinued Medications   No medications on file     Physical Exam: Filed Vitals:   05/18/14 1246  BP: 140/69  Pulse: 72  Temp: 99 F (37.2 C)  Resp: 18  Height: 5' 1"  (1.549 m)  Weight: 178 lb (80.74 kg)  SpO2: 94%    General- elderly female, frail, ill appearing, in no acute distress Head- normocephalic, atraumatic Nose- no  nasal discharge Throat- moist mucus membrane Eyes- PERRLA, EOMI, no pallor, no icterus, no discharge, normal conjunctiva, normal sclera Neck- no cervical lymphadenopathy Cardiovascular- normal s1,s2, no murmurs, palpable dorsalis pedis and radial pulses, no leg edema Respiratory- bilateral clear to auscultation, no wheeze, no rhonchi, no crackles, no use of accessory muscles Abdomen- bowel sounds present, soft, non tender Musculoskeletal- able to move all 4 extremities, generalized weakness   Neurological- no focal deficit Skin- warm and dry, long surgical incision in her back healing well with mild peri-incisional erythema, no drainage. Right lateral lower quadrant area incision healing well. Anterior abdominal incision with mild serous drainage and erythema, no signs of infection, dressings clean and dry Psychiatry- alert and oriented to person, place and time, normal mood and affect    Labs reviewed: Basic  Metabolic Panel:  Recent Labs  04/24/14 1427 05/09/14 0831  05/10/14 1033 05/10/14 1038 05/10/14 1246 05/12/14 1044  NA 136 134*  < > 129*  --  130* 127*  K 4.3 3.5  < > 3.6  --  4.2 4.3  CL 100 99  --   --   --   --  98  CO2 28  --   --   --   --   --  21  GLUCOSE 183* 190*  --   --  259* 302* 257*  BUN 24* 22  --   --   --   --  13  CREATININE 1.14* 1.10  --   --   --   --  1.20*  CALCIUM 9.4  --   --   --   --   --  7.5*  < > = values in this interval not displayed. Liver Function Tests:  Recent Labs  05/12/14 1044  AST 39*  ALT 25  ALKPHOS 76  BILITOT 0.8  PROT 5.1*  ALBUMIN 2.1*   No results for input(s): LIPASE, AMYLASE in the last 8760 hours. No results for input(s): AMMONIA in the last 8760 hours. CBC:  Recent Labs  04/24/14 1427  05/10/14 1246 05/12/14 1044 05/13/14 0830  WBC 5.9  --   --  10.7* 7.5  NEUTROABS  --   --   --  8.7*  --   HGB 11.1*  < > 9.9* 8.0* 9.7*  HCT 32.7*  < > 29.0* 23.1* 28.7*  MCV 82.6  --   --  81.9 83.4  PLT 368  --    --  235 227  < > = values in this interval not displayed. Cardiac Enzymes: No results for input(s): CKTOTAL, CKMB, CKMBINDEX, TROPONINI in the last 8760 hours. 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 In stage 1 she had L4-5, L5-S1 anterior lumbar interbody fusion; L3-4, L4-5 antero-lateral lumbar fusion (Right) with PEEK cages and anterior lumbar fixation. In stage 2, she had T 10 to Ileum pedicle screw fixation with  Laminectomy L3-4 and microdiscectomy with posterolateral arthrodesis with Autograft, allograft T10 to pelvis. Has follow up with nuerosurgery. Continue Dilaudid 2 mg q4h prn for pain and Robaxin 500 mg q8h prn for muscle spasm. Will have her work with physical therapy and occupational therapy team to help with gait training and muscle strengthening exercises.fall precautions. Skin care. Encourage to be out of bed. Back precautions  Blood loss anemia Post op, s/p 2 u prbc transfusion. Monitor h&h  Constipation On senna S 2 tabs bid and Dulcolax suppository prn. Change senna s to 2 tab daily for now  Diabetes mellitus hgbA1c 8.9. continue Glucovance 5-500 mg 2 tabs bid. Will start SSI humalog 5 u for cbg 150-250, 8 u for cbg 250-350 and 10 u for cbg > 350 for now with meals given her elevated cbg reading. Change cbg check to tid with meals and qhs  Protein calorie malnutrition Monitor po intake, continue skin care with pressure ulcer prophylaxis. Monitor weight. Continue protein supplements  Neuropathic pain continue gabapentin 100 mg daily  Hypertension continue amlodipine 5 mg daily, Ramipril 10 mg daily and HCTZ 12.5 mg daily. Monitor bmp  Hypothyroidism continue levothyroxine 112 mcg daily   Goals of care: short term  rehabilitation   Labs/tests ordered: cbc, bmp,  tsh  Family/ staff Communication: reviewed care plan with patient and nursing supervisor    Blanchie Serve, MD  [facility] [phone] (Monday-Friday 8 am - 5 pm) [phone] (afterhours)

## 2014-06-01 ENCOUNTER — Encounter: Payer: Self-pay | Admitting: Adult Health

## 2014-06-01 ENCOUNTER — Non-Acute Institutional Stay (SKILLED_NURSING_FACILITY): Payer: PPO | Admitting: Adult Health

## 2014-06-01 DIAGNOSIS — E118 Type 2 diabetes mellitus with unspecified complications: Secondary | ICD-10-CM

## 2014-06-01 DIAGNOSIS — K5901 Slow transit constipation: Secondary | ICD-10-CM

## 2014-06-01 DIAGNOSIS — D62 Acute posthemorrhagic anemia: Secondary | ICD-10-CM | POA: Diagnosis not present

## 2014-06-01 DIAGNOSIS — E46 Unspecified protein-calorie malnutrition: Secondary | ICD-10-CM | POA: Diagnosis not present

## 2014-06-01 DIAGNOSIS — L03311 Cellulitis of abdominal wall: Secondary | ICD-10-CM | POA: Diagnosis not present

## 2014-06-01 DIAGNOSIS — I10 Essential (primary) hypertension: Secondary | ICD-10-CM

## 2014-06-01 DIAGNOSIS — M4155 Other secondary scoliosis, thoracolumbar region: Secondary | ICD-10-CM

## 2014-06-01 DIAGNOSIS — E039 Hypothyroidism, unspecified: Secondary | ICD-10-CM

## 2014-06-01 DIAGNOSIS — Z79890 Hormone replacement therapy (postmenopausal): Secondary | ICD-10-CM

## 2014-06-01 DIAGNOSIS — G629 Polyneuropathy, unspecified: Secondary | ICD-10-CM | POA: Diagnosis not present

## 2014-06-02 NOTE — Progress Notes (Signed)
[name], female   DOB: 11/14/1945, 69 y.o.   MRN: [id number]   06/01/14  Facility:  [facility]ursing Home Location:  [place] Room Number: [id number] LEVEL OF CARE:  SNF (31)   Chief Complaint  Patient presents with  . Discharge Note    Scoliosis S/P surgery, protein-calorie malnutrition, hypertension, neuropathy, diabetes mellitus, hypothyroidism, HRT, constipation, cellulitis and anemia      HISTORY OF PRESENT ILLNESS:  This is a 69 year old female who is for discharge home with Home health PT for endurance, OT for ADLs and Nursing for wound care. DME:  Youth rolling walker and bedside commode . She has been admitted to [facility] on [facility]/15/15 from [facility] with Scoliosis, radiculopathy and HNP L3-3 S/P surgery with 2 stages. Stage1: Lumbar 4-5, L5- S1 anterior lumbar interbody fusion, lumbar 3-4, lumbar 4-5 anterior lateral lumbar fusion (right) with PEEK cages, anterior lumbar fixation; stage 2: T 10 2 ileum pedicle screw fixation with laminectomy L3-4 and microdiscectomy with posterolateral arthrodesis with autograft, allograft T 10 to pelvis - stage 2: Percutaneous pedicle screws T10 to ileum with Laminectomy L3-4.  She has PMH of esophageal stricture, hypertension, hyperlipidemia, BBB, hypothyroidism, venous insufficiency, cystocele, rectocele, stress incontinence and diabetes mellitus.  Her lower midline abdominal incision was noted to be erythematous, dressing with drainage. Patient is concerned of infection. WBC (5/12) 6.7. No fever.  Patient was admitted to this facility for short-term rehabilitation after the patient's recent hospitalization.  Patient has completed SNF rehabilitation and therapy has cleared the patient for discharge.  PAST MEDICAL HISTORY:  Past Medical History  Diagnosis Date  . Esophageal stricture   . Essential hypertension   . Hyperlipidemia   . Hypothyroidism   . Osteopenia   . DJD (degenerative joint  disease)   . Venous insufficiency   . Cystocele   . Rectocele   . Stress incontinence   . Torn rotator cuff     Bilateral  . Type 2 diabetes mellitus   . Anemia   . Lichen sclerosus   . RBBB with left anterior fascicular block     CURRENT MEDICATIONS: Reviewed per MAR/see medication list  Allergies  Allergen Reactions  . Coffee Flavor Other (See Comments)    Sick on stomach, sneezing, backed up  . Cephalexin Other (See Comments)    Yeast infection  . Ciprofloxacin Other (See Comments)    tendinitis  . Keflex [Cephalexin] Other (See Comments)    As long as taking Diflucan with it  . Oatmeal Other (See Comments)    Sneezing and drainage  . Sulfa Antibiotics Other (See Comments)    Yeast infection     REVIEW OF SYSTEMS:  GENERAL: no change in appetite, no fatigue, no weight changes, no fever, chills or weakness MOUTH:   Mouth sore RESPIRATORY: no cough, SOB, DOE, wheezing, hemoptysis CARDIAC: no chest pain, edema or palpitations GI: no abdominal pain, diarrhea, heart burn, nausea or vomiting, +constipation  PHYSICAL EXAMINATION  GENERAL: no acute distress, normal body habitus SKIN:  Surgical incision on inner LLQ lower portion is erythematous with minimal drainage , incision on outer RLQ and midback surgical site, dry, no erythema MOUTH:  Tongue has no lesion, slight redness noted NECK: supple, trachea midline, no neck masses, no thyroid tenderness, no thyromegaly LYMPHATICS: no LAN in the neck, no supraclavicular LAN RESPIRATORY: breathing is even & unlabored, BS CTAB CARDIAC: RRR, no murmur,no extra heart sounds, no edema GI: abdomen soft, normal  BS, no masses, no tenderness, no hepatomegaly, no splenomegaly EXTREMITIES: able to move all 4 extremities PSYCHIATRIC: the patient is alert & oriented to person, affect & behavior appropriate  LABS/RADIOLOGY: Labs reviewed: 05/31/14  WBC 6.7 hemoglobin 9.3 hematocrit 37.8 MCV 81.5 05/17/14  WBC 8.1 hemoglobin 9.2  hematocrit 27.5 MCV 84.9 sodium 130 potassium 4.6 glucose 231 BUN 18 creatinine 0.99 calcium 8.0 TSH 0000000 Basic Metabolic Panel:  Recent Labs  04/24/14 1427 05/09/14 0831  05/10/14 1033 05/10/14 1038 05/10/14 1246 05/12/14 1044  NA 136 134*  < > 129*  --  130* 127*  K 4.3 3.5  < > 3.6  --  4.2 4.3  CL 100 99  --   --   --   --  98  CO2 28  --   --   --   --   --  21  GLUCOSE 183* 190*  --   --  259* 302* 257*  BUN 24* 22  --   --   --   --  13  CREATININE 1.14* 1.10  --   --   --   --  1.20*  CALCIUM 9.4  --   --   --   --   --  7.5*  < > = values in this interval not displayed. Liver Function Tests:  Recent Labs  05/12/14 1044  AST 39*  ALT 25  ALKPHOS 76  BILITOT 0.8  PROT 5.1*  ALBUMIN 2.1*   CBC:  Recent Labs  04/24/14 1427  05/10/14 1246 05/12/14 1044 05/13/14 0830  WBC 5.9  --   --  10.7* 7.5  NEUTROABS  --   --   --  8.7*  --   HGB 11.1*  < > 9.9* 8.0* 9.7*  HCT 32.7*  < > 29.0* 23.1* 28.7*  MCV 82.6  --   --  81.9 83.4  PLT 368  --   --  235 227  < > = values in this interval not displayed. CBG:  Recent Labs  05/14/14 2141 05/15/14 0635 05/15/14 1133  GLUCAP 201* 148* 311*    Dg Lumbar Spine 2-3 Views  05/09/2014   CLINICAL DATA:  Lumbar discectomy and fusion  EXAM: LUMBAR SPINE - 2-3 VIEW  COMPARISON:  Earlier today  FINDINGS: Four C arm images show the lateral discectomy and fusion at L2-3 and L3-4. Previous anterior surgeries at L4-5 and L5-S1. No radiographically detectable complication.  IMPRESSION: Lateral discectomy and fusion at L2-3 and L3-4.   Electronically Signed   By: Nelson Chimes M.D.   On: 05/09/2014 20:12   Dg C-arm Gt 120 Min  05/09/2014   CLINICAL DATA:  69 year old female undergoing L4-S1 anterior lumbar interbody fusion and L2-L4 extreme lateral interbody fusion  EXAM: DG C-ARM GT 120 MIN  COMPARISON:  Preoperative lumbar spine MRI 03/10/2014  FINDINGS: For intraoperative spot images demonstrate interbody grafts at at L2-L3 and  L3-L4 and surgical changes of anterior fusion with interbody grafts and vertebral body screws at L4-L5 and L5-S1. No evidence of immediate complication.  IMPRESSION: Surgical changes as above.   Electronically Signed   By: Jacqulynn Cadet M.D.   On: 05/09/2014 20:16   Dg Or Local Abdomen  05/09/2014   CLINICAL DATA:  Anterior lumbar fusion.  Instrument count.  EXAM: OR LOCAL ABDOMEN  COMPARISON:  02/07/2014  FINDINGS: Anterior and interbody fusion L4-5 and L5-S1. The hardware appears to be well-positioned. Surgical clips overlying the left iliac wing and anterior to  the sacrum related to recent surgical exposure. No retained instruments.  Moderate lumbar levoscoliosis.  IMPRESSION: Expected postoperative findings.  No retained instrument.  These results were called by telephone at the time of interpretation on 05/09/2014 at 4:10 pm to [employer] RN who verbally acknowledged these results.   Electronically Signed   By: Franchot Gallo M.D.   On: 05/09/2014 16:10    ASSESSMENT/PLAN:  Scoliosis S/P surgery - for home health PT, OT and Nursing; follow-up with Dr. Antonieta Pert, on 5/16; continue Dilaudid 2 mg 1 tab by mouth every 4 hours when necessary for pain; and Robaxin 500 mg by mouth every 6 hours when necessary for muscle spasm Neuropathy - continue gabapentin 100 mg by mouth daily at bedtime Diabetes mellitus, type II - hgbA1c 8.9; continue Glucovance 5-500 mg 2 tabs by mouth twice a day Hypertension - her BP were low in the morning, 108/48; continue amlodipine 5 mg by mouth daily, decreased lisinopril to 30 mg POQ D and HCTZ was discontinued Hypothyroidism -  tsh 2.360; continue levothyroxine 112 g 1 tab by mouth daily HRT - continue estradiol 0.2 mg/ML 1 mL  Vaginally 2 times/week Constipation - continue senna S2 tabs by mouth twice a day and Dulcolax suppository 1 per rectum daily when necessary Anemia, acute blood loss - S/P transfusion of 2 units PRBC in hospital; hgb 9.3 Protein  calorie malnutrition, severe - albumin 2.1; continue supplementation Cellulitis of abdominal incision - start doxycycline 100 mg 1 by mouth twice a day 14 days and Florastor 250 mg 1 by mouth twice a day 17 days   I have filled out patient's discharge paperwork and written prescriptions.  Patient will receive home health PT, OT and Nursing.  DME provided: youth rolling walker and bedside commode  Total discharge time: Greater than 30 minutes  Discharge time involved coordination of the discharge process with social worker, nursing staff and therapy department. Medical justification for home health services/DME verified.     [facility] [employer], NP  [employer] [phone]

## 2014-08-09 ENCOUNTER — Other Ambulatory Visit: Payer: Self-pay

## 2014-08-09 DIAGNOSIS — Z1231 Encounter for screening mammogram for malignant neoplasm of breast: Secondary | ICD-10-CM

## 2014-10-17 ENCOUNTER — Ambulatory Visit
Admission: RE | Admit: 2014-10-17 | Discharge: 2014-10-17 | Disposition: A | Discharge status: Home / Self Care | Payer: PPO | Source: Ambulatory Visit

## 2014-10-17 DIAGNOSIS — Z1231 Encounter for screening mammogram for malignant neoplasm of breast: Secondary | ICD-10-CM

## 2014-11-11 ENCOUNTER — Ambulatory Visit (INDEPENDENT_AMBULATORY_CARE_PROVIDER_SITE_OTHER): Payer: PPO | Admitting: Emergency Medicine

## 2014-11-11 VITALS — BP 122/80 | HR 65 | Temp 97.5°F | Resp 20 | Ht 62.0 in | Wt 171.6 lb

## 2014-11-11 DIAGNOSIS — R300 Dysuria: Secondary | ICD-10-CM | POA: Diagnosis not present

## 2014-11-11 DIAGNOSIS — N3090 Cystitis, unspecified without hematuria: Secondary | ICD-10-CM

## 2014-11-11 LAB — POCT URINALYSIS DIP (MANUAL ENTRY)
BILIRUBIN UA: NEGATIVE
Glucose, UA: NEGATIVE
Ketones, POC UA: NEGATIVE
NITRITE UA: NEGATIVE
Protein Ur, POC: NEGATIVE
RBC UA: NEGATIVE
Spec Grav, UA: 1.01
Urobilinogen, UA: 0.2
pH, UA: 6

## 2014-11-11 LAB — POC MICROSCOPIC URINALYSIS (UMFC): Mucus: ABSENT

## 2014-11-11 MED ORDER — NITROFURANTOIN MONOHYD MACRO 100 MG PO CAPS: 100.0000 mg | ORAL_CAPSULE | Freq: Two times a day (BID) | ORAL | Status: DC

## 2014-11-11 NOTE — Progress Notes (Signed)
Subjective:  [name], female    DOB: [birth year]  Age: 69 y.o. MRN: [id number]  CC: Urinary Tract Infection   HPI Rhonda Clayton presents  patients only woman with dysuria and cloudy urine. She said she became aware last night that she had cloudy urine with burning. She has a bad smell to her urine. She denies any fever chills no nausea vomiting no vaginal discharge or bleeding. She has no back pain or other complaints. She has a history of prior urinary tract infection  History Rhonda Clayton has a past medical history of Esophageal stricture; Essential hypertension; Hyperlipidemia; Hypothyroidism; Osteopenia; DJD (degenerative joint disease); Venous insufficiency; Cystocele; Rectocele; Stress incontinence; Torn rotator cuff; Type 2 diabetes mellitus ([place]); Anemia; Lichen sclerosus; and RBBB with left anterior fascicular block.   She has past surgical history that includes Cholecystectomy (1987); Incontinence surgery; Rectal surgery; Anterior lumbar fusion (N/A, 05/09/2014); Anterior lat lumbar fusion (Right, 05/09/2014); Abdominal exposure (N/A, 05/09/2014); and Lumbar percutaneous pedicle screw 3 level (N/A, 05/10/2014).   Her  family history includes Cancer in her maternal aunt; Colon polyps in her brother; Diabetes in her father; Heart disease in her brother, maternal uncle, paternal grandfather, and paternal uncle; Multiple myeloma in her mother.  She   reports that she has never smoked. She has never used smokeless tobacco. 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 She reports that she does not drink alcohol.  Outpatient Prescriptions Prior to Visit  Medication Sig Dispense Refill  . amLODipine (NORVASC) 5 MG tablet Take 5 mg by mouth daily.    . Cholecalciferol (VITAMIN D PO) Take 1 capsule by mouth 2 (two) times daily.    . clobetasol ointment (TEMOVATE) 8.67 % Apply 1 application topically at bedtime.    . gabapentin (NEURONTIN) 100 MG capsule Take 100  mg by mouth at bedtime.    Marland Kitchen glyBURIDE-metformin (GLUCOVANCE) 5-500 MG per tablet Take 2 tablets by mouth 2 (two) times daily.    . hydrochlorothiazide (MICROZIDE) 12.5 MG capsule Take 12.5 mg by mouth daily.    Marland Kitchen HYDROmorphone (DILAUDID) 2 MG tablet Take 2 mg by mouth every 6 (six) hours as needed for severe pain.    Marland Kitchen levothyroxine (SYNTHROID, LEVOTHROID) 112 MCG tablet Take 100 mcg by mouth daily.     . NON FORMULARY Place 1 mL vaginally 2 (two) times a week. Estradiol 0.$RemoveBeforeDEI'2mg'[id number]$  / ml    . OVER THE COUNTER MEDICATION Apply 1 application topically as needed (for skin). Emu Aid    . ramipril (ALTACE) 10 MG tablet Take 10 mg by mouth daily.     No facility-administered medications prior to visit.    Social History   Social History  . Marital Status: Widowed    Spouse Name: N/A  . Number of Children: 2  . Years of Education: N/A   Occupational History  . Retired    Social History Main Topics  . Smoking status: Never Smoker   . Smokeless tobacco: Never Used  . Alcohol Use: No  . Drug Use: Yes    Special: Hydromorphone  . Sexual Activity: Not Asked   Other Topics Concern  . None   Social History Narrative     Review of Systems  Constitutional: Negative for fever, chills and appetite change.  HENT: Negative for congestion, ear pain, postnasal drip, sinus pressure and sore throat.   Eyes: Negative for pain and redness.  Respiratory: Negative for cough, shortness of breath and wheezing.   Cardiovascular: Negative  for leg swelling.  Gastrointestinal: Negative for nausea, vomiting, abdominal pain, diarrhea, constipation and blood in stool.  Endocrine: Negative for polyuria.  Genitourinary: Positive for dysuria, urgency and frequency. Negative for flank pain.  Musculoskeletal: Negative for gait problem.  Skin: Negative for rash.  Neurological: Negative for weakness and headaches.  Psychiatric/Behavioral: Negative for confusion and decreased concentration. The patient is not  nervous/anxious.     Objective:  BP 122/80 mmHg  Pulse 65  Temp(Src) 97.5 F (36.4 C) (Oral)  Resp 20  Ht _0  (1.575 m)  Wt 171 lb 9.6 oz (77.837 kg)  BMI 31.38 kg/m2  SpO2 95%  Physical Exam  Constitutional: She is oriented to person, place, and time. She appears well-developed and well-nourished. No distress.  HENT:  Head: Normocephalic and atraumatic.  Right Ear: External ear normal.  Left Ear: External ear normal.  Nose: Nose normal.  Eyes: Conjunctivae and EOM are normal. Pupils are equal, round, and reactive to light. No scleral icterus.  Neck: Normal range of motion. Neck supple. No tracheal deviation present.  Cardiovascular: Normal rate, regular rhythm and normal heart sounds.   Pulmonary/Chest: Effort normal. No respiratory distress. She has no wheezes. She has no rales.  Abdominal: She exhibits no mass. There is no tenderness. There is no rebound and no guarding.  Musculoskeletal: She exhibits no edema.  Lymphadenopathy:    She has no cervical adenopathy.  Neurological: She is alert and oriented to person, place, and time. Coordination normal.  Skin: Skin is warm and dry. No rash noted.  Psychiatric: She has a normal mood and affect. Her behavior is normal.      Assessment & Plan:   Brantley was seen today for urinary tract infection.  Diagnoses and all orders for this visit:  Dysuria -     POCT urinalysis dipstick -     POCT Microscopic Urinalysis ([facility])  Cystitis  Other orders -     nitrofurantoin, macrocrystal-monohydrate, (MACROBID) 100 MG capsule; Take 1 capsule (100 mg total) by mouth 2 (two) times daily.   I am having Ms. Tamargo start on nitrofurantoin (macrocrystal-monohydrate). I am also having her maintain her glyBURIDE-metformin, levothyroxine, ramipril, hydrochlorothiazide, NON FORMULARY, amLODipine, clobetasol ointment, OVER THE COUNTER MEDICATION, Cholecalciferol (VITAMIN D PO), HYDROmorphone, and gabapentin.  Meds ordered this encounter    Medications  . nitrofurantoin, macrocrystal-monohydrate, (MACROBID) 100 MG capsule    Sig: Take 1 capsule (100 mg total) by mouth 2 (two) times daily.    Dispense:  20 capsule    Refill:  0    Appropriate red flag conditions were discussed with the patient as well as actions that should be taken.  Patient expressed his understanding.  Follow-up: Return if symptoms worsen or fail to improve.  Roselee Culver, MD   Results for orders placed or performed in visit on 11/11/14  POCT urinalysis dipstick  Result Value Ref Range   Color, UA yellow yellow   Clarity, UA clear clear   Glucose, UA negative negative   Bilirubin, UA negative negative   Ketones, POC UA negative negative   Spec Grav, UA 1.010    Blood, UA negative negative   pH, UA 6.0    Protein Ur, POC negative negative   Urobilinogen, UA 0.2    Nitrite, UA Negative Negative   Leukocytes, UA Trace (A) Negative  POCT Microscopic Urinalysis ([facility])  Result Value Ref Range   WBC,UR,HPF,POC Few (A) None WBC/hpf   RBC,UR,HPF,POC None None RBC/hpf   Bacteria None None,  Too numerous to count   Mucus Absent Absent   Epithelial Cells, UR Per Microscopy Few (A) None, Too numerous to count cells/hpf

## 2014-11-11 NOTE — Patient Instructions (Signed)

## 2014-12-04 ENCOUNTER — Ambulatory Visit (INDEPENDENT_AMBULATORY_CARE_PROVIDER_SITE_OTHER): Payer: PPO | Admitting: Emergency Medicine

## 2014-12-04 VITALS — BP 118/76 | HR 68 | Temp 97.8°F | Resp 18 | Ht 62.0 in | Wt 173.2 lb

## 2014-12-04 DIAGNOSIS — J340 Abscess, furuncle and carbuncle of nose: Secondary | ICD-10-CM | POA: Diagnosis not present

## 2014-12-04 DIAGNOSIS — R8299 Other abnormal findings in urine: Secondary | ICD-10-CM | POA: Diagnosis not present

## 2014-12-04 DIAGNOSIS — D62 Acute posthemorrhagic anemia: Secondary | ICD-10-CM | POA: Diagnosis not present

## 2014-12-04 DIAGNOSIS — R82998 Other abnormal findings in urine: Secondary | ICD-10-CM

## 2014-12-04 LAB — POCT URINALYSIS DIP (MANUAL ENTRY)
Bilirubin, UA: NEGATIVE
GLUCOSE UA: NEGATIVE
Ketones, POC UA: NEGATIVE
Leukocytes, UA: NEGATIVE
NITRITE UA: NEGATIVE
PH UA: 5.5
Protein Ur, POC: NEGATIVE
RBC UA: NEGATIVE
Spec Grav, UA: 1.01
UROBILINOGEN UA: 0.2

## 2014-12-04 LAB — POCT CBC
Granulocyte percent: 52 %G (ref 37–80)
HCT, POC: 31.5 % — AB (ref 37.7–47.9)
Hemoglobin: 10.7 g/dL — AB (ref 12.2–16.2)
Lymph, poc: 1.8 (ref 0.6–3.4)
MCH: 27.9 pg (ref 27–31.2)
MCHC: 34.1 g/dL (ref 31.8–35.4)
MCV: 81.8 fL (ref 80–97)
MID (cbc): 0.5 (ref 0–0.9)
MPV: 6.1 fL (ref 0–99.8)
POC Granulocyte: 2.5 (ref 2–6.9)
POC LYMPH PERCENT: 37.1 %L (ref 10–50)
POC MID %: 10.9 % (ref 0–12)
Platelet Count, POC: 328 10*3/uL (ref 142–424)
RBC: 3.85 M/uL — AB (ref 4.04–5.48)
RDW, POC: 14.2 %
WBC: 4.9 10*3/uL (ref 4.6–10.2)

## 2014-12-04 LAB — POC MICROSCOPIC URINALYSIS (UMFC): Mucus: ABSENT

## 2014-12-04 MED ORDER — MUPIROCIN 2 % EX OINT: 1.0000 "application " | TOPICAL_OINTMENT | Freq: Three times a day (TID) | CUTANEOUS | Status: DC

## 2014-12-04 NOTE — Patient Instructions (Signed)

## 2014-12-04 NOTE — Progress Notes (Signed)
Subjective:  [name], female    DOB: 05/05/1945  Age: 69 y.o. MRN: [id number]  CC: Urinary Tract Infection and Nose Problem   HPI RUMOR SUN presents   Patient noticed foam in her urine yesterday and couldn't sleep last night out of anxiety that this may be related to protein loss in the urine elated diabetes. She has no dysuria urgency or frequency. No back pain. No nausea or vomiting. No abdominal pain she has no other complaint referable to her GI or GU  System. She's noticed a "blister" in her right nostril that's not associated with a purulent drainage.  History Linde has a past medical history of Esophageal stricture; Essential hypertension; Hyperlipidemia; Hypothyroidism; Osteopenia; DJD (degenerative joint disease); Venous insufficiency; Cystocele; Rectocele; Stress incontinence; Torn rotator cuff; Type 2 diabetes mellitus ([place]); Anemia; Lichen sclerosus; and RBBB with left anterior fascicular block.   She has past surgical history that includes Cholecystectomy (1987); Incontinence surgery; Rectal surgery; Anterior lumbar fusion (N/A, 05/09/2014); Anterior lat lumbar fusion (Right, 05/09/2014); Abdominal exposure (N/A, 05/09/2014); and Lumbar percutaneous pedicle screw 3 level (N/A, 05/10/2014).   Her  family history includes Cancer in her maternal aunt; Colon polyps in her brother; Diabetes in her father; Heart disease in her brother, maternal uncle, paternal grandfather, and paternal uncle; Multiple myeloma in her mother.  She   reports that she has never smoked. She has never used smokeless tobacco. 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 She reports that she does not drink alcohol.  Outpatient Prescriptions Prior to Visit  Medication Sig Dispense Refill  . amLODipine (NORVASC) 5 MG tablet Take 5 mg by mouth daily.    . Cholecalciferol (VITAMIN D PO) Take 1 capsule by mouth 2 (two) times daily.    . clobetasol ointment (TEMOVATE) 9.15 %  Apply 1 application topically at bedtime.    . gabapentin (NEURONTIN) 100 MG capsule Take 100 mg by mouth at bedtime.    Marland Kitchen glyBURIDE-metformin (GLUCOVANCE) 5-500 MG per tablet Take 2 tablets by mouth 2 (two) times daily.    . hydrochlorothiazide (MICROZIDE) 12.5 MG capsule Take 12.5 mg by mouth daily.    Marland Kitchen HYDROmorphone (DILAUDID) 2 MG tablet Take 2 mg by mouth every 6 (six) hours as needed for severe pain.    Marland Kitchen levothyroxine (SYNTHROID, LEVOTHROID) 112 MCG tablet Take 100 mcg by mouth daily.     . NON FORMULARY Place 1 mL vaginally 2 (two) times a week. Estradiol 0.17m / ml    . OVER THE COUNTER MEDICATION Apply 1 application topically as needed (for skin). Emu Aid    . ramipril (ALTACE) 10 MG tablet Take 10 mg by mouth daily.    . nitrofurantoin, macrocrystal-monohydrate, (MACROBID) 100 MG capsule Take 1 capsule (100 mg total) by mouth 2 (two) times daily. 20 capsule 0   No facility-administered medications prior to visit.    Social History   Social History  . Marital Status: Widowed    Spouse Name: N/A  . Number of Children: 2  . Years of Education: N/A   Occupational History  . Retired    Social History Main Topics  . Smoking status: Never Smoker   . Smokeless tobacco: Never Used  . Alcohol Use: No  . Drug Use: Yes    Special: Hydromorphone  . Sexual Activity: Not Asked   Other Topics Concern  . None   Social History Narrative     Review of Systems  Constitutional: Negative for fever,  chills and appetite change.  HENT: Negative for congestion, ear pain, postnasal drip, sinus pressure and sore throat.   Eyes: Negative for pain and redness.  Respiratory: Negative for cough, shortness of breath and wheezing.   Cardiovascular: Negative for leg swelling.  Gastrointestinal: Negative for nausea, vomiting, abdominal pain, diarrhea, constipation and blood in stool.  Endocrine: Negative for polyuria.  Genitourinary: Negative for dysuria, urgency, frequency and flank pain.    Musculoskeletal: Negative for gait problem.  Skin: Negative for rash.  Neurological: Negative for weakness and headaches.  Psychiatric/Behavioral: Negative for confusion and decreased concentration. The patient is not nervous/anxious.     Objective:  BP 118/76 mmHg  Pulse 68  Temp(Src) 97.8 F (36.6 C) (Oral)  Resp 18  Ht _0  (1.575 m)  Wt 173 lb 3.2 oz (78.563 kg)  BMI 31.67 kg/m2  SpO2 99%  Physical Exam    Assessment & Plan:   Christyl was seen today for urinary tract infection and nose problem.  Diagnoses and all orders for this visit:  Foamy urine -     POCT urinalysis dipstick -     POCT Microscopic Urinalysis ([facility]) -     POCT CBC  Acute blood loss anemia  Nasal abscess  Other orders -     mupirocin ointment (BACTROBAN) 2 %; Apply 1 application topically 3 (three) times daily.   I have discontinued Ms. Luba's nitrofurantoin (macrocrystal-monohydrate). I am also having her start on mupirocin ointment. Additionally, I am having her maintain her glyBURIDE-metformin, levothyroxine, ramipril, hydrochlorothiazide, NON FORMULARY, amLODipine, clobetasol ointment, OVER THE COUNTER MEDICATION, Cholecalciferol (VITAMIN D PO), HYDROmorphone, and gabapentin.  Meds ordered this encounter  Medications  . mupirocin ointment (BACTROBAN) 2 %    Sig: Apply 1 application topically 3 (three) times daily.    Dispense:  22 g    Refill:  1    Appropriate red flag conditions were discussed with the patient as well as actions that should be taken.  Patient expressed his understanding.  Follow-up: Return if symptoms worsen or fail to improve.  Roselee Culver, MD     Results for orders placed or performed in visit on 12/04/14  POCT urinalysis dipstick  Result Value Ref Range   Color, UA yellow yellow   Clarity, UA clear clear   Glucose, UA negative negative   Bilirubin, UA negative negative   Ketones, POC UA negative negative   Spec Grav, UA 1.010    Blood, UA  negative negative   pH, UA 5.5    Protein Ur, POC negative negative   Urobilinogen, UA 0.2    Nitrite, UA Negative Negative   Leukocytes, UA Negative Negative  POCT Microscopic Urinalysis ([facility])  Result Value Ref Range   WBC,UR,HPF,POC None None WBC/hpf   RBC,UR,HPF,POC None None RBC/hpf   Bacteria None None, Too numerous to count   Mucus Absent Absent   Epithelial Cells, UR Per Microscopy Few (A) None, Too numerous to count cells/hpf  POCT CBC  Result Value Ref Range   WBC 4.9 4.6 - 10.2 K/uL   Lymph, poc 1.8 0.6 - 3.4   POC LYMPH PERCENT 37.1 10 - 50 %L   MID (cbc) 0.5 0 - 0.9   POC MID % 10.9 0 - 12 %M   POC Granulocyte 2.5 2 - 6.9   Granulocyte percent 52.0 37 - 80 %G   RBC 3.85 (A) 4.04 - 5.48 M/uL   Hemoglobin 10.7 (A) 12.2 - 16.2 g/dL   HCT, POC 31.5 (  A) 37.7 - 47.9 %   MCV 81.8 80 - 97 fL   MCH, POC 27.9 27 - 31.2 pg   MCHC 34.1 31.8 - 35.4 g/dL   RDW, POC 14.2 %   Platelet Count, POC 328 142 - 424 K/uL   MPV 6.1 0 - 99.8 fL

## 2015-02-18 DIAGNOSIS — I10 Essential (primary) hypertension: Secondary | ICD-10-CM | POA: Diagnosis not present

## 2015-02-18 DIAGNOSIS — E784 Other hyperlipidemia: Secondary | ICD-10-CM | POA: Diagnosis not present

## 2015-02-18 DIAGNOSIS — N183 Chronic kidney disease, stage 3 (moderate): Secondary | ICD-10-CM | POA: Diagnosis not present

## 2015-02-18 DIAGNOSIS — D6489 Other specified anemias: Secondary | ICD-10-CM | POA: Diagnosis not present

## 2015-02-18 DIAGNOSIS — E038 Other specified hypothyroidism: Secondary | ICD-10-CM | POA: Diagnosis not present

## 2015-02-18 DIAGNOSIS — I129 Hypertensive chronic kidney disease with stage 1 through stage 4 chronic kidney disease, or unspecified chronic kidney disease: Secondary | ICD-10-CM | POA: Diagnosis not present

## 2015-02-18 DIAGNOSIS — F39 Unspecified mood [affective] disorder: Secondary | ICD-10-CM | POA: Diagnosis not present

## 2015-02-18 DIAGNOSIS — Z1389 Encounter for screening for other disorder: Secondary | ICD-10-CM | POA: Diagnosis not present

## 2015-02-18 DIAGNOSIS — E1129 Type 2 diabetes mellitus with other diabetic kidney complication: Secondary | ICD-10-CM | POA: Diagnosis not present

## 2015-02-18 DIAGNOSIS — Z6833 Body mass index (BMI) 33.0-33.9, adult: Secondary | ICD-10-CM | POA: Diagnosis not present

## 2015-02-18 DIAGNOSIS — E668 Other obesity: Secondary | ICD-10-CM | POA: Diagnosis not present

## 2015-02-25 DIAGNOSIS — Z6831 Body mass index (BMI) 31.0-31.9, adult: Secondary | ICD-10-CM | POA: Diagnosis not present

## 2015-02-25 DIAGNOSIS — I129 Hypertensive chronic kidney disease with stage 1 through stage 4 chronic kidney disease, or unspecified chronic kidney disease: Secondary | ICD-10-CM | POA: Diagnosis not present

## 2015-02-25 DIAGNOSIS — E1129 Type 2 diabetes mellitus with other diabetic kidney complication: Secondary | ICD-10-CM | POA: Diagnosis not present

## 2015-02-25 DIAGNOSIS — N184 Chronic kidney disease, stage 4 (severe): Secondary | ICD-10-CM | POA: Diagnosis not present

## 2015-03-01 DIAGNOSIS — R8299 Other abnormal findings in urine: Secondary | ICD-10-CM | POA: Diagnosis not present

## 2015-03-01 DIAGNOSIS — I10 Essential (primary) hypertension: Secondary | ICD-10-CM | POA: Diagnosis not present

## 2015-03-01 DIAGNOSIS — N390 Urinary tract infection, site not specified: Secondary | ICD-10-CM | POA: Diagnosis not present

## 2015-03-05 DIAGNOSIS — R8299 Other abnormal findings in urine: Secondary | ICD-10-CM | POA: Diagnosis not present

## 2015-03-05 DIAGNOSIS — N390 Urinary tract infection, site not specified: Secondary | ICD-10-CM | POA: Diagnosis not present

## 2015-03-05 DIAGNOSIS — N183 Chronic kidney disease, stage 3 (moderate): Secondary | ICD-10-CM | POA: Diagnosis not present

## 2015-04-11 DIAGNOSIS — D485 Neoplasm of uncertain behavior of skin: Secondary | ICD-10-CM | POA: Diagnosis not present

## 2015-04-11 DIAGNOSIS — L300 Nummular dermatitis: Secondary | ICD-10-CM | POA: Diagnosis not present

## 2015-04-11 DIAGNOSIS — L821 Other seborrheic keratosis: Secondary | ICD-10-CM | POA: Diagnosis not present

## 2015-04-11 DIAGNOSIS — L304 Erythema intertrigo: Secondary | ICD-10-CM | POA: Diagnosis not present

## 2015-04-11 DIAGNOSIS — D2261 Melanocytic nevi of right upper limb, including shoulder: Secondary | ICD-10-CM | POA: Diagnosis not present

## 2015-04-11 DIAGNOSIS — L218 Other seborrheic dermatitis: Secondary | ICD-10-CM | POA: Diagnosis not present

## 2015-04-23 ENCOUNTER — Encounter: Payer: PPO | Attending: Internal Medicine | Admitting: *Deleted

## 2015-04-23 ENCOUNTER — Encounter: Payer: Self-pay | Admitting: *Deleted

## 2015-04-23 VITALS — Ht 62.5 in | Wt 183.2 lb

## 2015-04-23 DIAGNOSIS — E119 Type 2 diabetes mellitus without complications: Secondary | ICD-10-CM

## 2015-04-23 DIAGNOSIS — E669 Obesity, unspecified: Secondary | ICD-10-CM | POA: Insufficient documentation

## 2015-04-23 DIAGNOSIS — N289 Disorder of kidney and ureter, unspecified: Secondary | ICD-10-CM | POA: Insufficient documentation

## 2015-04-23 DIAGNOSIS — Z6832 Body mass index (BMI) 32.0-32.9, adult: Secondary | ICD-10-CM | POA: Diagnosis not present

## 2015-04-23 DIAGNOSIS — I10 Essential (primary) hypertension: Secondary | ICD-10-CM | POA: Insufficient documentation

## 2015-04-23 NOTE — Patient Instructions (Signed)
Plan:  Aim for 2-3 Carb Choices per meal (30-45 grams) Aim for 1 Carbs per snack if hungry  Include protein in moderation with your meals and snacks Consider reading food labels for Total Carbohydrate and Fat Grams of foods Consider  increasing your activity level by riding recumbent bicycle for 30-60 minutes daily as tolerated - get the rest of the body involved. Consider checking BG at alternate times per day as directed by MD  Continue taking medication  as directed by MD  CARBS + protein = happy face :-)   You are making good choices, you know what to do...Marland KitchenMarland Kitchen

## 2015-04-23 NOTE — Progress Notes (Signed)
Diabetes Self-Management Education  Visit Type: First/Initial  Appt. Start Time: 1400 Appt. End Time: [id number]  04/23/2015  Ms. Rhonda Clayton, identified by name and date of birth, is a 70 y.o. female with a diagnosis of Diabetes: Type 2. Rhonda Clayton lives alone. She has recently had major spinal surgery. She mentions multiple "diets" she has been on most of her adult life. She now experiences "emotional eating" to "fill a void". We discussed alternate behaviors to eating.  ASSESSMENT  Height 5' 2.5" (1.588 m), weight 183 lb 3.2 oz (83.099 kg). Body mass index is 32.95 kg/(m^2).      Diabetes Self-Management Education - 04/23/15 1724    Visit Information   Visit Type First/Initial   Initial Visit   Diabetes Type Type 2   Are you currently following a meal plan? No   Are you taking your medications as prescribed? Yes   Date Diagnosed Late 2's or 3's (age?)   Health Coping   How would you rate your overall health? Good   Psychosocial Assessment   Patient Belief/Attitude about Diabetes Motivated to manage diabetes   Self-care barriers None   Self-management support Doctor's office;CDE visits   Other persons present Patient   Patient Concerns Nutrition/Meal planning;Medication;Monitoring;Healthy Lifestyle;Glycemic Control   Special Needs None   Preferred Learning Style No preference indicated   Learning Readiness Change in progress   How often do you need to have someone help you when you read instructions, pamphlets, or other written materials from your doctor or pharmacy? 2 - Rarely   Complications   Last HgB A1C per patient/outside source 10.9 %   How often do you check your blood sugar? 1-2 times/day   Fasting Blood glucose range (mg/dL) 130-179   Have you had a dilated eye exam in the past 12 months? Yes   Have you had a dental exam in the past 12 months? Yes   Are you checking your feet? Yes   How many days per week are you checking your feet? 7   Exercise   Exercise Type  Light (walking / raking leaves)   How many days per week to you exercise? 6   How many minutes per day do you exercise? 45   Total minutes per week of exercise 270   Patient Education   Previous Diabetes Education No   Nutrition management  Role of diet in the treatment of diabetes and the relationship between the three main macronutrients and blood glucose level;Food label reading, portion sizes and measuring food.;Carbohydrate counting;Information on hints to eating out and maintain blood glucose control.;Meal options for control of blood glucose level and chronic complications.   Physical activity and exercise  Role of exercise on diabetes management, blood pressure control and cardiac health.   Medications Reviewed patients medication for diabetes, action, purpose, timing of dose and side effects.   Monitoring Purpose and frequency of SMBG.   Psychosocial adjustment Worked with patient to identify barriers to care and solutions   Individualized Goals (developed by patient)   Nutrition General guidelines for healthy choices and portions discussed   Physical Activity Exercise 5-7 days per week;45 minutes per day   Medications take my medication as prescribed   Monitoring  test my blood glucose as discussed   Reducing Risk do foot checks daily;increase portions of nuts and seeds   Outcomes   Expected Outcomes Demonstrated interest in learning. Expect positive outcomes   Future DMSE PRN   Program Status Completed  Individualized Plan for Diabetes Self-Management Training:   Learning Objective:  Patient will have a greater understanding of diabetes self-management. Patient education plan is to attend individual and/or group sessions per assessed needs and concerns.   Plan:   Patient Instructions  Plan:  Aim for 2-3 Carb Choices per meal (30-45 grams) Aim for 1 Carbs per snack if hungry  Include protein in moderation with your meals and snacks Consider reading food labels for  Total Carbohydrate and Fat Grams of foods Consider  increasing your activity level by riding recumbent bicycle for 30-60 minutes daily as tolerated - get the rest of the body involved. Consider checking BG at alternate times per day as directed by MD  Continue taking medication  as directed by MD  CARBS + protein = happy face :-)   You are making good choices, you know what to do.....  Expected Outcomes:  Demonstrated interest in learning. Expect positive outcomes  Education material provided: Living Well with Diabetes, Meal plan card, My Plate and Snack sheet  If problems or questions, patient to contact team via:  Phone  Future DSME appointment: PRN

## 2015-05-02 ENCOUNTER — Ambulatory Visit (INDEPENDENT_AMBULATORY_CARE_PROVIDER_SITE_OTHER): Payer: PPO | Admitting: Family Medicine

## 2015-05-02 VITALS — BP 142/82 | HR 71 | Temp 97.3°F | Resp 16 | Ht 62.0 in | Wt 181.0 lb

## 2015-05-02 DIAGNOSIS — E871 Hypo-osmolality and hyponatremia: Secondary | ICD-10-CM

## 2015-05-02 DIAGNOSIS — R8290 Unspecified abnormal findings in urine: Secondary | ICD-10-CM | POA: Diagnosis not present

## 2015-05-02 DIAGNOSIS — M25472 Effusion, left ankle: Secondary | ICD-10-CM

## 2015-05-02 LAB — POCT URINALYSIS DIP (MANUAL ENTRY)
Bilirubin, UA: NEGATIVE
Glucose, UA: NEGATIVE
Ketones, POC UA: NEGATIVE
Nitrite, UA: NEGATIVE
Spec Grav, UA: <=1.005
Urobilinogen, UA: 0.2
pH, UA: 5.5

## 2015-05-02 LAB — POC MICROSCOPIC URINALYSIS (UMFC): Mucus: ABSENT

## 2015-05-02 MED ORDER — HYDROCHLOROTHIAZIDE 12.5 MG PO CAPS: 12.5000 mg | ORAL_CAPSULE | Freq: Every day | ORAL | Status: DC

## 2015-05-02 MED ORDER — NITROFURANTOIN MONOHYD MACRO 100 MG PO CAPS: 100.0000 mg | ORAL_CAPSULE | Freq: Two times a day (BID) | ORAL | Status: DC

## 2015-05-02 NOTE — Patient Instructions (Signed)
My thinking is that in time the left ankle swelling or gradually go away. Most of the medicine that is used for acute inflammation causes problems with kidney function so other than Tylenol, I would not use any prescription medication.  Urine does show some early signs of infection so I have called in Macrodantin for that.

## 2015-05-02 NOTE — Progress Notes (Signed)
Rhonda Clayton presentswith symptoms she thinks are related to a UTI.  He thinks he may have a little bit of nocturia (she usually gets up at night anyway) and has malodorous urine.  In addition she's noticed a little bit of dysuria over the last several days.. No back pain. No nausea or vomiting. No abdominal pain she has no other complaint referable to her GI or GU System. She's noticed a "blister" in her right nostril that's not associated with a purulent drainage.  History Rhonda Clayton has a past medical history of Esophageal stricture; Essential hypertension; Hyperlipidemia; Hypothyroidism; Osteopenia; DJD (degenerative joint disease); Venous insufficiency; Cystocele; Rectocele; Stress incontinence; Torn rotator cuff; Type 2 diabetes mellitus ([place]); Anemia; Lichen sclerosus; and RBBB with left anterior fascicular block.   She has past surgical history that includes Cholecystectomy (1987); Incontinence surgery; Rectal surgery; Anterior lumbar fusion (N/A, 05/09/2014); Anterior lat lumbar fusion (Right, 05/09/2014); Abdominal exposure (N/A, 05/09/2014); and Lumbar percutaneous pedicle screw 3 level (N/A, 05/10/2014).   Her family history includes Cancer in her maternal aunt; Colon polyps in her brother; Diabetes in her father; Heart disease in her brother, maternal uncle, paternal grandfather, and paternal uncle; Multiple myeloma in her mother.  She reports that she has never smoked. She has never used smokeless tobacco. 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 She reports that she does not drink alcohol.  2. Patient also complains about left ankle swelling. This is been going on since she [place] observ[place] and steady on her feet for 3 hours on March 24 of this year. She likes to ride a recombant bicycle every night for 45 minutes.   Since the swelling has occurred, she's urges some inserts in hopes that supporting the arch will help.  3. Patient has a history of  hyponatremia with borderline renal function. She would like to avoid any medication that might impact her kidney function.  Objective: BP 142/82 mmHg  Pulse 71  Temp(Src) 97.3 F (36.3 C) (Oral)  Resp 16  Ht 5' 2"  (1.575 m)  Wt 181 lb (82.101 kg)  BMI 33.10 kg/m2  SpO2 98% Examination of the left ankle reveals mild swelling around the medial malleolus and lateral malleolus, particularly posterior to these. She has no point tenderness and she has good range of motion. Pulses in the feet are normal.  Results for orders placed or performed in visit on 12/04/14  POCT urinalysis dipstick  Result Value Ref Range   Color, UA yellow yellow   Clarity, UA clear clear   Glucose, UA negative negative   Bilirubin, UA negative negative   Ketones, POC UA negative negative   Spec Grav, UA 1.010    Blood, UA negative negative   pH, UA 5.5    Protein Ur, POC negative negative   Urobilinogen, UA 0.2    Nitrite, UA Negative Negative   Leukocytes, UA Negative Negative  POCT Microscopic Urinalysis ([facility])  Result Value Ref Range   WBC,UR,HPF,POC None None WBC/hpf   RBC,UR,HPF,POC None None RBC/hpf   Bacteria None None, Too numerous to count   Mucus Absent Absent   Epithelial Cells, UR Per Microscopy Few (A) None, Too numerous to count cells/hpf  POCT CBC  Result Value Ref Range   WBC 4.9 4.6 - 10.2 K/uL   Lymph, poc 1.8 0.6 - 3.4   POC LYMPH PERCENT 37.1 10 - 50 %L   MID (cbc) 0.5 0 - 0.9   POC MID % 10.9 0 -  12 %M   POC Granulocyte 2.5 2 - 6.9   Granulocyte percent 52.0 37 - 80 %G   RBC 3.85 (A) 4.04 - 5.48 M/uL   Hemoglobin 10.7 (A) 12.2 - 16.2 g/dL   HCT, POC 31.5 (A) 37.7 - 47.9 %   MCV 81.8 80 - 97 fL   MCH, POC 27.9 27 - 31.2 pg   MCHC 34.1 31.8 - 35.4 g/dL   RDW, POC 14.2 %   Platelet Count, POC 328 142 - 424 K/uL   MPV 6.1 0 - 99.8 fL    Today's urine shows trace leukocyte esterase.  Assessment: Lab suggestive of early UTI and will use Macrobid to prevent any interference  with renal function. In addition, she has an effusion in her left ankle which I think over time will resolve without heavy take any nonsteroidals or special medicine. I think using the inserts is a reasonable approach.   Malodorous urine - Plan: POCT urinalysis dipstick, POCT Microscopic Urinalysis ([facility]), nitrofurantoin, macrocrystal-monohydrate, (MACROBID) 100 MG capsule  Left ankle swelling  Hyponatremia syndrome  Rhonda Haber, MD

## 2015-05-09 DIAGNOSIS — E1129 Type 2 diabetes mellitus with other diabetic kidney complication: Secondary | ICD-10-CM | POA: Diagnosis not present

## 2015-05-09 DIAGNOSIS — R300 Dysuria: Secondary | ICD-10-CM | POA: Diagnosis not present

## 2015-05-09 DIAGNOSIS — H353131 Nonexudative age-related macular degeneration, bilateral, early dry stage: Secondary | ICD-10-CM | POA: Diagnosis not present

## 2015-05-09 DIAGNOSIS — E113393 Type 2 diabetes mellitus with moderate nonproliferative diabetic retinopathy without macular edema, bilateral: Secondary | ICD-10-CM | POA: Diagnosis not present

## 2015-05-09 DIAGNOSIS — H43813 Vitreous degeneration, bilateral: Secondary | ICD-10-CM | POA: Diagnosis not present

## 2015-05-10 DIAGNOSIS — Z6832 Body mass index (BMI) 32.0-32.9, adult: Secondary | ICD-10-CM | POA: Diagnosis not present

## 2015-05-10 DIAGNOSIS — I129 Hypertensive chronic kidney disease with stage 1 through stage 4 chronic kidney disease, or unspecified chronic kidney disease: Secondary | ICD-10-CM | POA: Diagnosis not present

## 2015-05-10 DIAGNOSIS — E1129 Type 2 diabetes mellitus with other diabetic kidney complication: Secondary | ICD-10-CM | POA: Diagnosis not present

## 2015-05-10 DIAGNOSIS — R300 Dysuria: Secondary | ICD-10-CM | POA: Diagnosis not present

## 2015-05-10 DIAGNOSIS — E11319 Type 2 diabetes mellitus with unspecified diabetic retinopathy without macular edema: Secondary | ICD-10-CM | POA: Diagnosis not present

## 2015-05-10 DIAGNOSIS — D649 Anemia, unspecified: Secondary | ICD-10-CM | POA: Diagnosis not present

## 2015-05-10 DIAGNOSIS — N184 Chronic kidney disease, stage 4 (severe): Secondary | ICD-10-CM | POA: Diagnosis not present

## 2015-05-17 DIAGNOSIS — B351 Tinea unguium: Secondary | ICD-10-CM

## 2015-05-24 DIAGNOSIS — L900 Lichen sclerosus et atrophicus: Secondary | ICD-10-CM | POA: Diagnosis not present

## 2015-05-24 DIAGNOSIS — E038 Other specified hypothyroidism: Secondary | ICD-10-CM | POA: Diagnosis not present

## 2015-05-24 DIAGNOSIS — Z6832 Body mass index (BMI) 32.0-32.9, adult: Secondary | ICD-10-CM | POA: Diagnosis not present

## 2015-05-24 DIAGNOSIS — I10 Essential (primary) hypertension: Secondary | ICD-10-CM | POA: Diagnosis not present

## 2015-05-24 DIAGNOSIS — E1129 Type 2 diabetes mellitus with other diabetic kidney complication: Secondary | ICD-10-CM | POA: Diagnosis not present

## 2015-05-27 DIAGNOSIS — M4316 Spondylolisthesis, lumbar region: Secondary | ICD-10-CM | POA: Diagnosis not present

## 2015-05-27 DIAGNOSIS — M4126 Other idiopathic scoliosis, lumbar region: Secondary | ICD-10-CM | POA: Diagnosis not present

## 2015-05-27 DIAGNOSIS — Z6833 Body mass index (BMI) 33.0-33.9, adult: Secondary | ICD-10-CM | POA: Diagnosis not present

## 2015-05-27 DIAGNOSIS — I10 Essential (primary) hypertension: Secondary | ICD-10-CM | POA: Diagnosis not present

## 2015-08-07 DIAGNOSIS — N952 Postmenopausal atrophic vaginitis: Secondary | ICD-10-CM | POA: Diagnosis not present

## 2015-08-07 DIAGNOSIS — L900 Lichen sclerosus et atrophicus: Secondary | ICD-10-CM | POA: Diagnosis not present

## 2015-10-01 ENCOUNTER — Other Ambulatory Visit: Payer: Self-pay | Admitting: Surgery

## 2015-10-01 DIAGNOSIS — M6208 Separation of muscle (nontraumatic), other site: Secondary | ICD-10-CM | POA: Diagnosis not present

## 2015-10-17 DIAGNOSIS — L720 Epidermal cyst: Secondary | ICD-10-CM | POA: Diagnosis not present

## 2015-10-17 DIAGNOSIS — B078 Other viral warts: Secondary | ICD-10-CM | POA: Diagnosis not present

## 2015-10-25 DIAGNOSIS — H04123 Dry eye syndrome of bilateral lacrimal glands: Secondary | ICD-10-CM | POA: Diagnosis not present

## 2015-10-25 DIAGNOSIS — H25813 Combined forms of age-related cataract, bilateral: Secondary | ICD-10-CM | POA: Diagnosis not present

## 2015-10-25 DIAGNOSIS — H52203 Unspecified astigmatism, bilateral: Secondary | ICD-10-CM | POA: Diagnosis not present

## 2015-10-25 DIAGNOSIS — E113393 Type 2 diabetes mellitus with moderate nonproliferative diabetic retinopathy without macular edema, bilateral: Secondary | ICD-10-CM | POA: Diagnosis not present

## 2015-11-07 DIAGNOSIS — E113392 Type 2 diabetes mellitus with moderate nonproliferative diabetic retinopathy without macular edema, left eye: Secondary | ICD-10-CM | POA: Diagnosis not present

## 2015-11-07 DIAGNOSIS — E113391 Type 2 diabetes mellitus with moderate nonproliferative diabetic retinopathy without macular edema, right eye: Secondary | ICD-10-CM | POA: Diagnosis not present

## 2015-11-07 DIAGNOSIS — H353121 Nonexudative age-related macular degeneration, left eye, early dry stage: Secondary | ICD-10-CM | POA: Diagnosis not present

## 2015-11-07 DIAGNOSIS — H353111 Nonexudative age-related macular degeneration, right eye, early dry stage: Secondary | ICD-10-CM | POA: Diagnosis not present

## 2015-11-08 DIAGNOSIS — N762 Acute vulvitis: Secondary | ICD-10-CM | POA: Diagnosis not present

## 2015-11-08 DIAGNOSIS — Z6833 Body mass index (BMI) 33.0-33.9, adult: Secondary | ICD-10-CM | POA: Diagnosis not present

## 2015-11-08 DIAGNOSIS — Z01419 Encounter for gynecological examination (general) (routine) without abnormal findings: Secondary | ICD-10-CM | POA: Diagnosis not present

## 2015-11-15 DIAGNOSIS — M859 Disorder of bone density and structure, unspecified: Secondary | ICD-10-CM | POA: Diagnosis not present

## 2015-11-15 DIAGNOSIS — E1129 Type 2 diabetes mellitus with other diabetic kidney complication: Secondary | ICD-10-CM | POA: Diagnosis not present

## 2015-11-15 DIAGNOSIS — E038 Other specified hypothyroidism: Secondary | ICD-10-CM | POA: Diagnosis not present

## 2015-11-15 DIAGNOSIS — E784 Other hyperlipidemia: Secondary | ICD-10-CM | POA: Diagnosis not present

## 2015-11-15 DIAGNOSIS — I10 Essential (primary) hypertension: Secondary | ICD-10-CM | POA: Diagnosis not present

## 2015-11-22 DIAGNOSIS — N184 Chronic kidney disease, stage 4 (severe): Secondary | ICD-10-CM | POA: Diagnosis not present

## 2015-11-22 DIAGNOSIS — E1129 Type 2 diabetes mellitus with other diabetic kidney complication: Secondary | ICD-10-CM | POA: Diagnosis not present

## 2015-11-22 DIAGNOSIS — R0989 Other specified symptoms and signs involving the circulatory and respiratory systems: Secondary | ICD-10-CM | POA: Diagnosis not present

## 2015-11-22 DIAGNOSIS — E11319 Type 2 diabetes mellitus with unspecified diabetic retinopathy without macular edema: Secondary | ICD-10-CM | POA: Diagnosis not present

## 2015-11-22 DIAGNOSIS — M8580 Other specified disorders of bone density and structure, unspecified site: Secondary | ICD-10-CM | POA: Diagnosis not present

## 2015-11-22 DIAGNOSIS — E669 Obesity, unspecified: Secondary | ICD-10-CM | POA: Diagnosis not present

## 2015-11-22 DIAGNOSIS — E784 Other hyperlipidemia: Secondary | ICD-10-CM | POA: Diagnosis not present

## 2015-11-22 DIAGNOSIS — I4510 Unspecified right bundle-branch block: Secondary | ICD-10-CM | POA: Diagnosis not present

## 2015-11-22 DIAGNOSIS — N390 Urinary tract infection, site not specified: Secondary | ICD-10-CM | POA: Diagnosis not present

## 2015-11-22 DIAGNOSIS — Z0000 Encounter for general adult medical examination without abnormal findings: Secondary | ICD-10-CM | POA: Diagnosis not present

## 2015-11-22 DIAGNOSIS — F39 Unspecified mood [affective] disorder: Secondary | ICD-10-CM | POA: Diagnosis not present

## 2015-11-22 DIAGNOSIS — I129 Hypertensive chronic kidney disease with stage 1 through stage 4 chronic kidney disease, or unspecified chronic kidney disease: Secondary | ICD-10-CM | POA: Diagnosis not present

## 2015-11-22 DIAGNOSIS — D649 Anemia, unspecified: Secondary | ICD-10-CM | POA: Diagnosis not present

## 2015-11-26 DIAGNOSIS — I10 Essential (primary) hypertension: Secondary | ICD-10-CM | POA: Diagnosis not present

## 2015-11-26 DIAGNOSIS — R8299 Other abnormal findings in urine: Secondary | ICD-10-CM | POA: Diagnosis not present

## 2015-11-27 DIAGNOSIS — M5416 Radiculopathy, lumbar region: Secondary | ICD-10-CM | POA: Diagnosis not present

## 2015-11-27 DIAGNOSIS — M4126 Other idiopathic scoliosis, lumbar region: Secondary | ICD-10-CM | POA: Diagnosis not present

## 2015-11-27 DIAGNOSIS — M4316 Spondylolisthesis, lumbar region: Secondary | ICD-10-CM | POA: Diagnosis not present

## 2015-11-27 DIAGNOSIS — M4120 Other idiopathic scoliosis, site unspecified: Secondary | ICD-10-CM | POA: Diagnosis not present

## 2015-11-27 DIAGNOSIS — M5126 Other intervertebral disc displacement, lumbar region: Secondary | ICD-10-CM | POA: Diagnosis not present

## 2015-11-28 ENCOUNTER — Other Ambulatory Visit: Payer: Self-pay | Admitting: Obstetrics and Gynecology

## 2015-11-28 DIAGNOSIS — Z1231 Encounter for screening mammogram for malignant neoplasm of breast: Secondary | ICD-10-CM

## 2015-11-28 DIAGNOSIS — Z1212 Encounter for screening for malignant neoplasm of rectum: Secondary | ICD-10-CM | POA: Diagnosis not present

## 2015-12-02 ENCOUNTER — Other Ambulatory Visit (HOSPITAL_COMMUNITY): Payer: Self-pay | Admitting: Internal Medicine

## 2015-12-02 DIAGNOSIS — R0989 Other specified symptoms and signs involving the circulatory and respiratory systems: Secondary | ICD-10-CM

## 2015-12-03 ENCOUNTER — Ambulatory Visit (HOSPITAL_COMMUNITY)
Admission: RE | Admit: 2015-12-03 | Discharge: 2015-12-03 | Disposition: A | Discharge status: Home / Self Care | Payer: PPO | Source: Ambulatory Visit | Attending: Vascular Surgery | Admitting: Vascular Surgery

## 2015-12-03 DIAGNOSIS — I6523 Occlusion and stenosis of bilateral carotid arteries: Secondary | ICD-10-CM | POA: Diagnosis not present

## 2015-12-03 DIAGNOSIS — R0989 Other specified symptoms and signs involving the circulatory and respiratory systems: Secondary | ICD-10-CM | POA: Diagnosis not present

## 2015-12-03 LAB — VAS US CAROTID
LCCADSYS: -66 cm/s
LCCAPDIAS: -15 cm/s
LCCAPSYS: -89 cm/s
LEFT ECA DIAS: -3 cm/s
LEFT VERTEBRAL DIAS: -9 cm/s
Left CCA dist dias: -13 cm/s
Left ICA dist dias: -25 cm/s
Left ICA dist sys: -72 cm/s
Left ICA prox dias: -16 cm/s
Left ICA prox sys: -60 cm/s
RCCADSYS: -84 cm/s
RCCAPDIAS: 17 cm/s
RCCAPSYS: 140 cm/s
RIGHT CCA MID DIAS: 18 cm/s
RIGHT ECA DIAS: 7 cm/s
RIGHT VERTEBRAL DIAS: -15 cm/s

## 2015-12-20 DIAGNOSIS — R197 Diarrhea, unspecified: Secondary | ICD-10-CM | POA: Diagnosis not present

## 2015-12-20 DIAGNOSIS — Z6832 Body mass index (BMI) 32.0-32.9, adult: Secondary | ICD-10-CM | POA: Diagnosis not present

## 2015-12-20 DIAGNOSIS — E1129 Type 2 diabetes mellitus with other diabetic kidney complication: Secondary | ICD-10-CM | POA: Diagnosis not present

## 2015-12-20 DIAGNOSIS — E1165 Type 2 diabetes mellitus with hyperglycemia: Secondary | ICD-10-CM | POA: Diagnosis not present

## 2015-12-20 DIAGNOSIS — I10 Essential (primary) hypertension: Secondary | ICD-10-CM | POA: Diagnosis not present

## 2015-12-20 DIAGNOSIS — I129 Hypertensive chronic kidney disease with stage 1 through stage 4 chronic kidney disease, or unspecified chronic kidney disease: Secondary | ICD-10-CM | POA: Diagnosis not present

## 2015-12-20 DIAGNOSIS — N183 Chronic kidney disease, stage 3 (moderate): Secondary | ICD-10-CM | POA: Diagnosis not present

## 2016-01-03 ENCOUNTER — Ambulatory Visit
Admission: RE | Admit: 2016-01-03 | Discharge: 2016-01-03 | Disposition: A | Discharge status: Home / Self Care | Payer: PPO | Source: Ambulatory Visit | Attending: Obstetrics and Gynecology | Admitting: Obstetrics and Gynecology

## 2016-01-03 DIAGNOSIS — Z1231 Encounter for screening mammogram for malignant neoplasm of breast: Secondary | ICD-10-CM | POA: Diagnosis not present

## 2016-01-06 DIAGNOSIS — I129 Hypertensive chronic kidney disease with stage 1 through stage 4 chronic kidney disease, or unspecified chronic kidney disease: Secondary | ICD-10-CM | POA: Diagnosis not present

## 2016-01-06 DIAGNOSIS — E1129 Type 2 diabetes mellitus with other diabetic kidney complication: Secondary | ICD-10-CM | POA: Diagnosis not present

## 2016-01-06 DIAGNOSIS — E1165 Type 2 diabetes mellitus with hyperglycemia: Secondary | ICD-10-CM | POA: Diagnosis not present

## 2016-01-06 DIAGNOSIS — Z6832 Body mass index (BMI) 32.0-32.9, adult: Secondary | ICD-10-CM | POA: Diagnosis not present

## 2016-01-20 HISTORY — PX: VAGINA SURGERY: SHX829

## 2016-02-20 DIAGNOSIS — N950 Postmenopausal bleeding: Secondary | ICD-10-CM | POA: Diagnosis not present

## 2016-02-28 DIAGNOSIS — N950 Postmenopausal bleeding: Secondary | ICD-10-CM | POA: Diagnosis not present

## 2016-03-18 DIAGNOSIS — E1129 Type 2 diabetes mellitus with other diabetic kidney complication: Secondary | ICD-10-CM | POA: Diagnosis not present

## 2016-03-18 DIAGNOSIS — E669 Obesity, unspecified: Secondary | ICD-10-CM | POA: Diagnosis not present

## 2016-03-18 DIAGNOSIS — I129 Hypertensive chronic kidney disease with stage 1 through stage 4 chronic kidney disease, or unspecified chronic kidney disease: Secondary | ICD-10-CM | POA: Diagnosis not present

## 2016-03-18 DIAGNOSIS — N840 Polyp of corpus uteri: Secondary | ICD-10-CM | POA: Diagnosis not present

## 2016-03-18 DIAGNOSIS — D649 Anemia, unspecified: Secondary | ICD-10-CM | POA: Diagnosis not present

## 2016-03-18 DIAGNOSIS — N184 Chronic kidney disease, stage 4 (severe): Secondary | ICD-10-CM | POA: Diagnosis not present

## 2016-03-18 DIAGNOSIS — E1165 Type 2 diabetes mellitus with hyperglycemia: Secondary | ICD-10-CM | POA: Diagnosis not present

## 2016-03-18 DIAGNOSIS — Z6832 Body mass index (BMI) 32.0-32.9, adult: Secondary | ICD-10-CM | POA: Diagnosis not present

## 2016-03-28 IMAGING — CR DG OR LOCAL ABDOMEN
1 series · 1 of 1 positions shown · non-contrast
Comparison: 02/07/2014

CLINICAL DATA: Anterior lumbar fusion.  Instrument count.

EXAM:
OR LOCAL ABDOMEN

[supine ap]
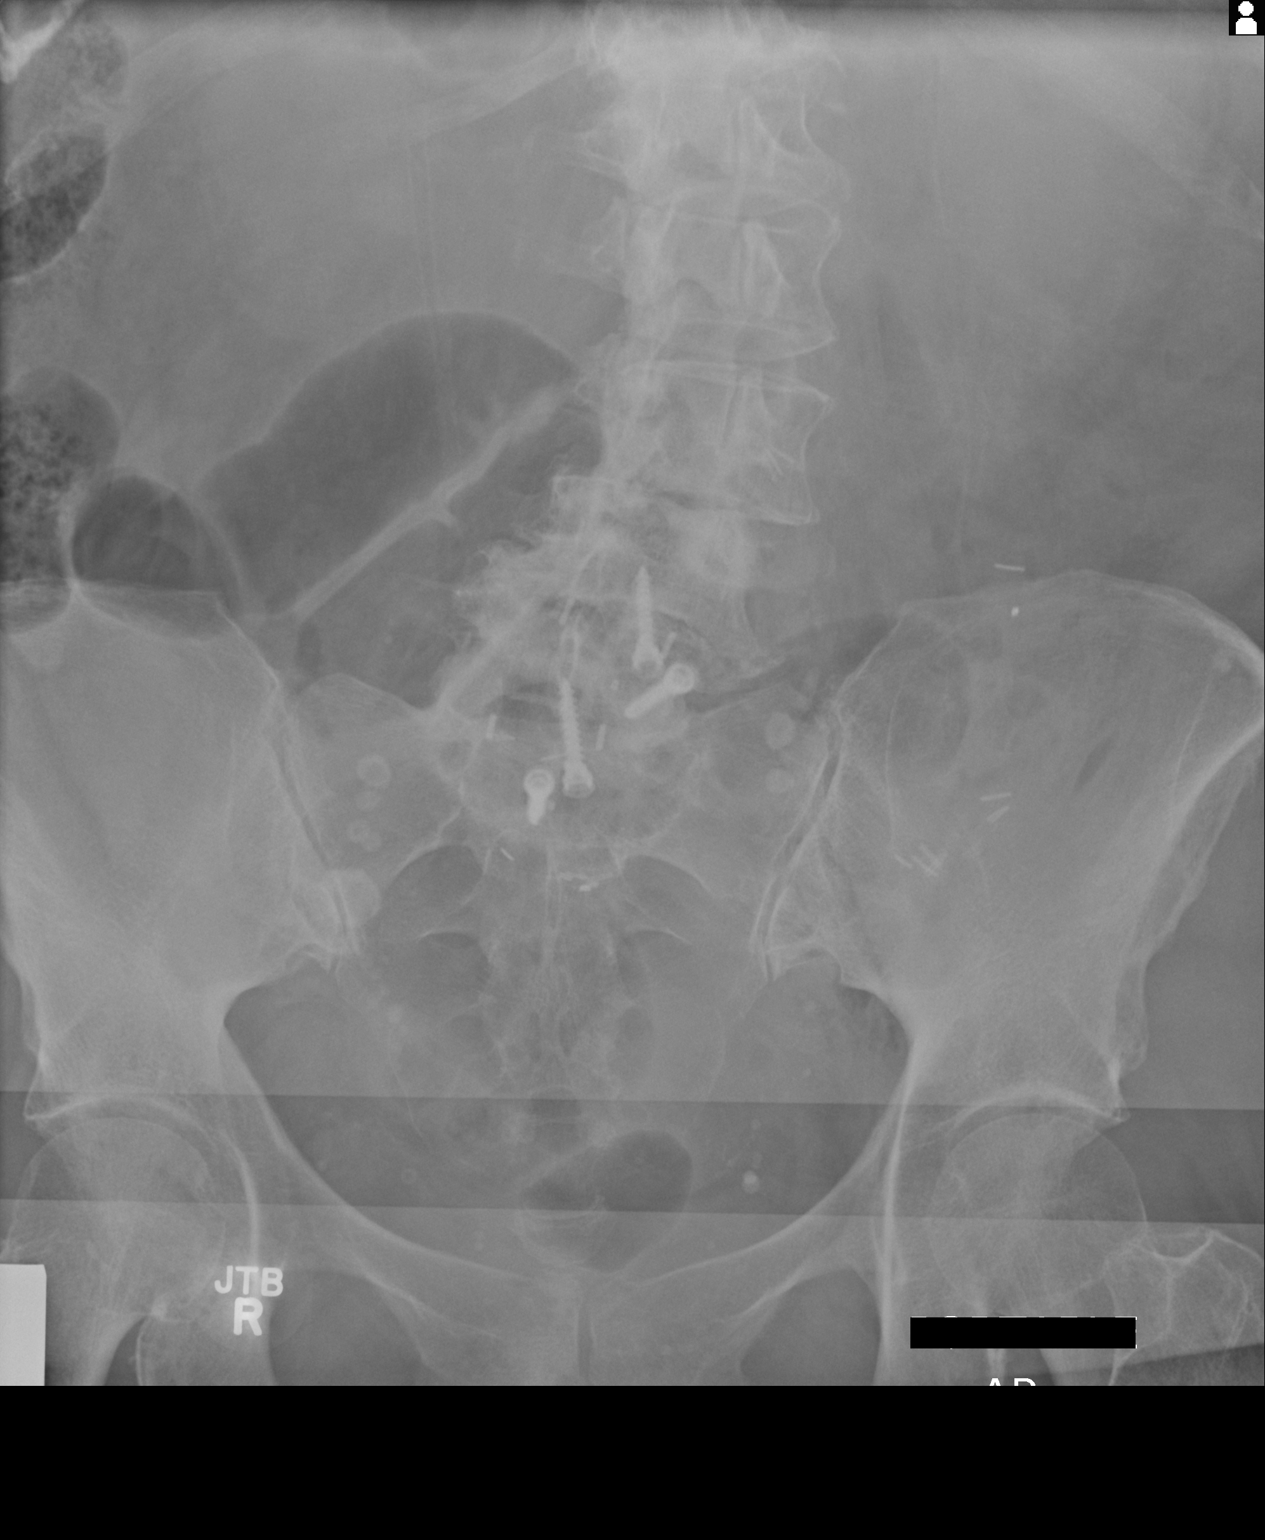

[1 of 1 positions shown; findings below may reference images not displayed]

FINDINGS: Anterior and interbody fusion L4-5 and L5-S1. The hardware appears
to be well-positioned. Surgical clips overlying the left iliac wing
and anterior to the sacrum related to recent surgical exposure. No
retained instruments.

Moderate lumbar levoscoliosis.
IMPRESSION: Expected postoperative findings.  No retained instrument.

These results were called by telephone at the time of interpretation
on 05/09/2014 at [DATE] to Pierre Merlin Batomen RN who verbally acknowledged
these results.

## 2016-04-02 DIAGNOSIS — E1129 Type 2 diabetes mellitus with other diabetic kidney complication: Secondary | ICD-10-CM | POA: Diagnosis not present

## 2016-04-02 DIAGNOSIS — E669 Obesity, unspecified: Secondary | ICD-10-CM | POA: Diagnosis not present

## 2016-04-02 DIAGNOSIS — E1165 Type 2 diabetes mellitus with hyperglycemia: Secondary | ICD-10-CM | POA: Diagnosis not present

## 2016-04-02 DIAGNOSIS — I10 Essential (primary) hypertension: Secondary | ICD-10-CM | POA: Diagnosis not present

## 2016-04-02 DIAGNOSIS — N184 Chronic kidney disease, stage 4 (severe): Secondary | ICD-10-CM | POA: Diagnosis not present

## 2016-04-15 DIAGNOSIS — E1129 Type 2 diabetes mellitus with other diabetic kidney complication: Secondary | ICD-10-CM | POA: Diagnosis not present

## 2016-04-15 DIAGNOSIS — I10 Essential (primary) hypertension: Secondary | ICD-10-CM | POA: Diagnosis not present

## 2016-04-15 DIAGNOSIS — N184 Chronic kidney disease, stage 4 (severe): Secondary | ICD-10-CM | POA: Diagnosis not present

## 2016-04-15 DIAGNOSIS — E669 Obesity, unspecified: Secondary | ICD-10-CM | POA: Diagnosis not present

## 2016-04-16 ENCOUNTER — Other Ambulatory Visit: Payer: Self-pay | Admitting: Nephrology

## 2016-04-16 DIAGNOSIS — I10 Essential (primary) hypertension: Secondary | ICD-10-CM

## 2016-04-16 DIAGNOSIS — N184 Chronic kidney disease, stage 4 (severe): Secondary | ICD-10-CM | POA: Diagnosis not present

## 2016-04-16 DIAGNOSIS — Z6832 Body mass index (BMI) 32.0-32.9, adult: Secondary | ICD-10-CM | POA: Diagnosis not present

## 2016-04-16 DIAGNOSIS — E1129 Type 2 diabetes mellitus with other diabetic kidney complication: Secondary | ICD-10-CM | POA: Diagnosis not present

## 2016-05-01 ENCOUNTER — Ambulatory Visit
Admission: RE | Admit: 2016-05-01 | Discharge: 2016-05-01 | Disposition: A | Discharge status: Home / Self Care | Payer: PPO | Source: Ambulatory Visit | Attending: Nephrology | Admitting: Nephrology

## 2016-05-01 DIAGNOSIS — I10 Essential (primary) hypertension: Secondary | ICD-10-CM

## 2016-05-01 DIAGNOSIS — N184 Chronic kidney disease, stage 4 (severe): Secondary | ICD-10-CM

## 2016-05-06 DIAGNOSIS — E1165 Type 2 diabetes mellitus with hyperglycemia: Secondary | ICD-10-CM | POA: Diagnosis not present

## 2016-05-06 DIAGNOSIS — E1129 Type 2 diabetes mellitus with other diabetic kidney complication: Secondary | ICD-10-CM | POA: Diagnosis not present

## 2016-05-06 DIAGNOSIS — M10072 Idiopathic gout, left ankle and foot: Secondary | ICD-10-CM | POA: Diagnosis not present

## 2016-05-06 DIAGNOSIS — I10 Essential (primary) hypertension: Secondary | ICD-10-CM | POA: Diagnosis not present

## 2016-05-06 DIAGNOSIS — N184 Chronic kidney disease, stage 4 (severe): Secondary | ICD-10-CM | POA: Diagnosis not present

## 2016-05-06 DIAGNOSIS — Z6832 Body mass index (BMI) 32.0-32.9, adult: Secondary | ICD-10-CM | POA: Diagnosis not present

## 2016-05-18 DIAGNOSIS — B078 Other viral warts: Secondary | ICD-10-CM | POA: Diagnosis not present

## 2016-05-18 DIAGNOSIS — D225 Melanocytic nevi of trunk: Secondary | ICD-10-CM | POA: Diagnosis not present

## 2016-05-18 DIAGNOSIS — L814 Other melanin hyperpigmentation: Secondary | ICD-10-CM | POA: Diagnosis not present

## 2016-05-18 DIAGNOSIS — D1723 Benign lipomatous neoplasm of skin and subcutaneous tissue of right leg: Secondary | ICD-10-CM | POA: Diagnosis not present

## 2016-05-18 DIAGNOSIS — L720 Epidermal cyst: Secondary | ICD-10-CM | POA: Diagnosis not present

## 2016-05-19 DIAGNOSIS — E113293 Type 2 diabetes mellitus with mild nonproliferative diabetic retinopathy without macular edema, bilateral: Secondary | ICD-10-CM | POA: Diagnosis not present

## 2016-05-19 DIAGNOSIS — H353131 Nonexudative age-related macular degeneration, bilateral, early dry stage: Secondary | ICD-10-CM | POA: Diagnosis not present

## 2016-06-03 DIAGNOSIS — N390 Urinary tract infection, site not specified: Secondary | ICD-10-CM | POA: Diagnosis not present

## 2016-06-03 DIAGNOSIS — R300 Dysuria: Secondary | ICD-10-CM | POA: Diagnosis not present

## 2016-06-03 DIAGNOSIS — N184 Chronic kidney disease, stage 4 (severe): Secondary | ICD-10-CM | POA: Diagnosis not present

## 2016-06-03 DIAGNOSIS — R8290 Unspecified abnormal findings in urine: Secondary | ICD-10-CM | POA: Diagnosis not present

## 2016-06-03 DIAGNOSIS — E1165 Type 2 diabetes mellitus with hyperglycemia: Secondary | ICD-10-CM | POA: Diagnosis not present

## 2016-06-03 DIAGNOSIS — E1129 Type 2 diabetes mellitus with other diabetic kidney complication: Secondary | ICD-10-CM | POA: Diagnosis not present

## 2016-06-03 DIAGNOSIS — I10 Essential (primary) hypertension: Secondary | ICD-10-CM | POA: Diagnosis not present

## 2016-06-03 DIAGNOSIS — M10072 Idiopathic gout, left ankle and foot: Secondary | ICD-10-CM | POA: Diagnosis not present

## 2016-06-03 DIAGNOSIS — Z6833 Body mass index (BMI) 33.0-33.9, adult: Secondary | ICD-10-CM | POA: Diagnosis not present

## 2016-06-26 DIAGNOSIS — I10 Essential (primary) hypertension: Secondary | ICD-10-CM | POA: Diagnosis not present

## 2016-06-26 DIAGNOSIS — E1129 Type 2 diabetes mellitus with other diabetic kidney complication: Secondary | ICD-10-CM | POA: Diagnosis not present

## 2016-06-26 DIAGNOSIS — E669 Obesity, unspecified: Secondary | ICD-10-CM | POA: Diagnosis not present

## 2016-06-26 DIAGNOSIS — N2581 Secondary hyperparathyroidism of renal origin: Secondary | ICD-10-CM | POA: Diagnosis not present

## 2016-06-26 DIAGNOSIS — N189 Chronic kidney disease, unspecified: Secondary | ICD-10-CM | POA: Diagnosis not present

## 2016-06-26 DIAGNOSIS — N184 Chronic kidney disease, stage 4 (severe): Secondary | ICD-10-CM | POA: Diagnosis not present

## 2016-06-26 DIAGNOSIS — D631 Anemia in chronic kidney disease: Secondary | ICD-10-CM | POA: Diagnosis not present

## 2016-07-01 DIAGNOSIS — I129 Hypertensive chronic kidney disease with stage 1 through stage 4 chronic kidney disease, or unspecified chronic kidney disease: Secondary | ICD-10-CM | POA: Diagnosis not present

## 2016-07-01 DIAGNOSIS — E669 Obesity, unspecified: Secondary | ICD-10-CM | POA: Diagnosis not present

## 2016-07-01 DIAGNOSIS — N840 Polyp of corpus uteri: Secondary | ICD-10-CM | POA: Diagnosis not present

## 2016-07-01 DIAGNOSIS — N184 Chronic kidney disease, stage 4 (severe): Secondary | ICD-10-CM | POA: Diagnosis not present

## 2016-07-01 DIAGNOSIS — Z1389 Encounter for screening for other disorder: Secondary | ICD-10-CM | POA: Diagnosis not present

## 2016-07-01 DIAGNOSIS — E1129 Type 2 diabetes mellitus with other diabetic kidney complication: Secondary | ICD-10-CM | POA: Diagnosis not present

## 2016-07-01 DIAGNOSIS — Z6833 Body mass index (BMI) 33.0-33.9, adult: Secondary | ICD-10-CM | POA: Diagnosis not present

## 2016-07-03 DIAGNOSIS — H25813 Combined forms of age-related cataract, bilateral: Secondary | ICD-10-CM | POA: Diagnosis not present

## 2016-07-03 DIAGNOSIS — E113293 Type 2 diabetes mellitus with mild nonproliferative diabetic retinopathy without macular edema, bilateral: Secondary | ICD-10-CM | POA: Diagnosis not present

## 2016-07-03 DIAGNOSIS — H52203 Unspecified astigmatism, bilateral: Secondary | ICD-10-CM | POA: Diagnosis not present

## 2016-07-03 DIAGNOSIS — H353131 Nonexudative age-related macular degeneration, bilateral, early dry stage: Secondary | ICD-10-CM | POA: Diagnosis not present

## 2016-07-06 ENCOUNTER — Other Ambulatory Visit (HOSPITAL_COMMUNITY): Payer: Self-pay

## 2016-07-10 ENCOUNTER — Other Ambulatory Visit (HOSPITAL_COMMUNITY): Payer: Self-pay | Admitting: *Deleted

## 2016-07-13 ENCOUNTER — Ambulatory Visit (HOSPITAL_COMMUNITY)
Admission: RE | Admit: 2016-07-13 | Discharge: 2016-07-13 | Disposition: A | Discharge status: Home / Self Care | Payer: PPO | Source: Ambulatory Visit | Attending: Nephrology | Admitting: Nephrology

## 2016-07-13 ENCOUNTER — Encounter (HOSPITAL_COMMUNITY): Payer: Self-pay

## 2016-07-13 DIAGNOSIS — N189 Chronic kidney disease, unspecified: Secondary | ICD-10-CM | POA: Insufficient documentation

## 2016-07-13 DIAGNOSIS — D631 Anemia in chronic kidney disease: Secondary | ICD-10-CM | POA: Insufficient documentation

## 2016-07-13 MED ORDER — SODIUM CHLORIDE 0.9 % IV SOLN
510.0000 mg | INTRAVENOUS | Status: DC
  Administered 2016-07-13: 510 mg via INTRAVENOUS
  Filled 2016-07-13: qty 17

## 2016-07-13 NOTE — Discharge Instructions (Signed)

## 2016-07-16 ENCOUNTER — Other Ambulatory Visit (HOSPITAL_COMMUNITY): Payer: Self-pay

## 2016-07-20 ENCOUNTER — Ambulatory Visit (HOSPITAL_COMMUNITY)
Admission: RE | Admit: 2016-07-20 | Discharge: 2016-07-20 | Disposition: A | Discharge status: Home / Self Care | Payer: PPO | Source: Ambulatory Visit | Attending: Nephrology | Admitting: Nephrology

## 2016-07-20 DIAGNOSIS — D631 Anemia in chronic kidney disease: Secondary | ICD-10-CM | POA: Diagnosis not present

## 2016-07-20 DIAGNOSIS — N189 Chronic kidney disease, unspecified: Secondary | ICD-10-CM | POA: Insufficient documentation

## 2016-07-20 MED ORDER — FERUMOXYTOL INJECTION 510 MG/17 ML
510.0000 mg | Freq: Once | INTRAVENOUS | Status: AC
  Administered 2016-07-20: 510 mg via INTRAVENOUS
  Filled 2016-07-20: qty 17

## 2016-07-27 DIAGNOSIS — E1129 Type 2 diabetes mellitus with other diabetic kidney complication: Secondary | ICD-10-CM | POA: Diagnosis not present

## 2016-07-27 DIAGNOSIS — Z6833 Body mass index (BMI) 33.0-33.9, adult: Secondary | ICD-10-CM | POA: Diagnosis not present

## 2016-07-27 DIAGNOSIS — N840 Polyp of corpus uteri: Secondary | ICD-10-CM | POA: Diagnosis not present

## 2016-07-29 DIAGNOSIS — N950 Postmenopausal bleeding: Secondary | ICD-10-CM | POA: Diagnosis not present

## 2016-07-29 DIAGNOSIS — N858 Other specified noninflammatory disorders of uterus: Secondary | ICD-10-CM | POA: Diagnosis not present

## 2016-07-29 DIAGNOSIS — N840 Polyp of corpus uteri: Secondary | ICD-10-CM | POA: Diagnosis not present

## 2016-10-12 DIAGNOSIS — E669 Obesity, unspecified: Secondary | ICD-10-CM | POA: Diagnosis not present

## 2016-10-12 DIAGNOSIS — D631 Anemia in chronic kidney disease: Secondary | ICD-10-CM | POA: Diagnosis not present

## 2016-10-12 DIAGNOSIS — N2581 Secondary hyperparathyroidism of renal origin: Secondary | ICD-10-CM | POA: Diagnosis not present

## 2016-10-12 DIAGNOSIS — E1129 Type 2 diabetes mellitus with other diabetic kidney complication: Secondary | ICD-10-CM | POA: Diagnosis not present

## 2016-10-12 DIAGNOSIS — I10 Essential (primary) hypertension: Secondary | ICD-10-CM | POA: Diagnosis not present

## 2016-10-12 DIAGNOSIS — N184 Chronic kidney disease, stage 4 (severe): Secondary | ICD-10-CM | POA: Diagnosis not present

## 2016-11-11 DIAGNOSIS — N184 Chronic kidney disease, stage 4 (severe): Secondary | ICD-10-CM | POA: Diagnosis not present

## 2016-11-20 DIAGNOSIS — E1129 Type 2 diabetes mellitus with other diabetic kidney complication: Secondary | ICD-10-CM | POA: Diagnosis not present

## 2016-11-20 DIAGNOSIS — R82998 Other abnormal findings in urine: Secondary | ICD-10-CM | POA: Diagnosis not present

## 2016-11-20 DIAGNOSIS — M859 Disorder of bone density and structure, unspecified: Secondary | ICD-10-CM | POA: Diagnosis not present

## 2016-11-20 DIAGNOSIS — E7849 Other hyperlipidemia: Secondary | ICD-10-CM | POA: Diagnosis not present

## 2016-11-20 DIAGNOSIS — E038 Other specified hypothyroidism: Secondary | ICD-10-CM | POA: Diagnosis not present

## 2016-11-27 DIAGNOSIS — Z0000 Encounter for general adult medical examination without abnormal findings: Secondary | ICD-10-CM | POA: Diagnosis not present

## 2016-11-27 DIAGNOSIS — Z1389 Encounter for screening for other disorder: Secondary | ICD-10-CM | POA: Diagnosis not present

## 2016-11-27 DIAGNOSIS — R0989 Other specified symptoms and signs involving the circulatory and respiratory systems: Secondary | ICD-10-CM | POA: Diagnosis not present

## 2016-11-27 DIAGNOSIS — E7849 Other hyperlipidemia: Secondary | ICD-10-CM | POA: Diagnosis not present

## 2016-11-27 DIAGNOSIS — E1129 Type 2 diabetes mellitus with other diabetic kidney complication: Secondary | ICD-10-CM | POA: Diagnosis not present

## 2016-11-27 DIAGNOSIS — I129 Hypertensive chronic kidney disease with stage 1 through stage 4 chronic kidney disease, or unspecified chronic kidney disease: Secondary | ICD-10-CM | POA: Diagnosis not present

## 2016-11-27 DIAGNOSIS — Z6833 Body mass index (BMI) 33.0-33.9, adult: Secondary | ICD-10-CM | POA: Diagnosis not present

## 2016-11-27 DIAGNOSIS — N184 Chronic kidney disease, stage 4 (severe): Secondary | ICD-10-CM | POA: Diagnosis not present

## 2016-11-27 DIAGNOSIS — E11319 Type 2 diabetes mellitus with unspecified diabetic retinopathy without macular edema: Secondary | ICD-10-CM | POA: Diagnosis not present

## 2016-11-27 DIAGNOSIS — F3489 Other specified persistent mood disorders: Secondary | ICD-10-CM | POA: Diagnosis not present

## 2016-11-27 DIAGNOSIS — M10072 Idiopathic gout, left ankle and foot: Secondary | ICD-10-CM | POA: Diagnosis not present

## 2016-11-27 DIAGNOSIS — E668 Other obesity: Secondary | ICD-10-CM | POA: Diagnosis not present

## 2016-12-03 ENCOUNTER — Other Ambulatory Visit: Payer: Self-pay | Admitting: Obstetrics and Gynecology

## 2016-12-03 DIAGNOSIS — Z1212 Encounter for screening for malignant neoplasm of rectum: Secondary | ICD-10-CM | POA: Diagnosis not present

## 2016-12-03 DIAGNOSIS — Z1231 Encounter for screening mammogram for malignant neoplasm of breast: Secondary | ICD-10-CM

## 2016-12-15 DIAGNOSIS — H43813 Vitreous degeneration, bilateral: Secondary | ICD-10-CM | POA: Diagnosis not present

## 2016-12-15 DIAGNOSIS — E113293 Type 2 diabetes mellitus with mild nonproliferative diabetic retinopathy without macular edema, bilateral: Secondary | ICD-10-CM | POA: Diagnosis not present

## 2016-12-15 DIAGNOSIS — H35371 Puckering of macula, right eye: Secondary | ICD-10-CM | POA: Diagnosis not present

## 2016-12-17 DIAGNOSIS — Z124 Encounter for screening for malignant neoplasm of cervix: Secondary | ICD-10-CM | POA: Diagnosis not present

## 2016-12-17 DIAGNOSIS — L904 Acrodermatitis chronica atrophicans: Secondary | ICD-10-CM | POA: Diagnosis not present

## 2016-12-17 DIAGNOSIS — Z6834 Body mass index (BMI) 34.0-34.9, adult: Secondary | ICD-10-CM | POA: Diagnosis not present

## 2017-01-04 ENCOUNTER — Ambulatory Visit: Payer: Self-pay

## 2017-01-07 DIAGNOSIS — E669 Obesity, unspecified: Secondary | ICD-10-CM | POA: Diagnosis not present

## 2017-01-07 DIAGNOSIS — N184 Chronic kidney disease, stage 4 (severe): Secondary | ICD-10-CM | POA: Diagnosis not present

## 2017-01-07 DIAGNOSIS — D631 Anemia in chronic kidney disease: Secondary | ICD-10-CM | POA: Diagnosis not present

## 2017-01-07 DIAGNOSIS — E1122 Type 2 diabetes mellitus with diabetic chronic kidney disease: Secondary | ICD-10-CM | POA: Diagnosis not present

## 2017-01-07 DIAGNOSIS — I129 Hypertensive chronic kidney disease with stage 1 through stage 4 chronic kidney disease, or unspecified chronic kidney disease: Secondary | ICD-10-CM | POA: Diagnosis not present

## 2017-01-25 DIAGNOSIS — M859 Disorder of bone density and structure, unspecified: Secondary | ICD-10-CM | POA: Diagnosis not present

## 2017-02-05 ENCOUNTER — Ambulatory Visit
Admission: RE | Admit: 2017-02-05 | Discharge: 2017-02-05 | Disposition: A | Discharge status: Home / Self Care | Payer: PPO | Source: Ambulatory Visit | Attending: Obstetrics and Gynecology | Admitting: Obstetrics and Gynecology

## 2017-02-05 DIAGNOSIS — Z1231 Encounter for screening mammogram for malignant neoplasm of breast: Secondary | ICD-10-CM | POA: Diagnosis not present

## 2017-02-19 ENCOUNTER — Encounter: Payer: Self-pay | Admitting: Gastroenterology

## 2017-02-19 ENCOUNTER — Ambulatory Visit (INDEPENDENT_AMBULATORY_CARE_PROVIDER_SITE_OTHER): Payer: PPO | Admitting: Gastroenterology

## 2017-02-19 VITALS — BP 112/78 | HR 74 | Ht 62.5 in | Wt 199.2 lb

## 2017-02-19 DIAGNOSIS — Z1211 Encounter for screening for malignant neoplasm of colon: Secondary | ICD-10-CM | POA: Diagnosis not present

## 2017-02-19 DIAGNOSIS — R159 Full incontinence of feces: Secondary | ICD-10-CM | POA: Diagnosis not present

## 2017-02-19 NOTE — Progress Notes (Signed)
Rhonda Clayton    [id number]    Jun 15, 1945  Primary Care Physician:Avva, Steva Ready, MD  Referring Physician: Prince Solian, MD [address] [place], [place] [address]  Chief complaint: Colorectal cancer screening  HPI: 72 year old female with a history of GERD, family history of advanced colon polyps in her brother and her son is here to discuss colorectal cancer screening.  Patient says she had a colonoscopy in 2011 but records not available.  On chart review her last colonoscopy was in April 2006 by Dr. Oletta Lamas, [facility] and prior to that in October 1998, no polyps with small internal hemorrhoids otherwise normal exam.  Previously followed by Dr. Olevia Perches was last seen in March 2015.  She denies any blood in stool or blood per rectum.  Her bowel habits have changed, she is having more pasty soft stool, 1-2 bowel movements a day since 1 of her blood pressure medications changed.  Denies constipation or diarrhea.  She has occasional fecal incontinence with fecal smearing and also has incontinence to flatus.    Outpatient Encounter Medications as of 02/19/2017  Medication Sig  . aspirin EC 81 MG tablet Take 81 mg by mouth daily.  . betamethasone dipropionate (DIPROLENE) 0.05 % cream Apply topically 2 (two) times daily.  . chlorthalidone (HYGROTON) 25 MG tablet Take 25 mg by mouth daily.  . clobetasol ointment (TEMOVATE) 1.95 % Apply 1 application topically at bedtime.  . Insulin Aspart (NOVOLOG [place]) Inject 22 Units into the skin 2 (two) times daily.  Marland Kitchen levothyroxine (SYNTHROID, LEVOTHROID) 112 MCG tablet Take 100 mcg by mouth daily.   Marland Kitchen telmisartan-hydrochlorothiazide (MICARDIS HCT) 80-25 MG tablet Take 1 tablet by mouth daily.  . [DISCONTINUED] Cholecalciferol (VITAMIN D PO) Take 1 capsule by mouth 2 (two) times daily.  . [DISCONTINUED] glyBURIDE-metformin (GLUCOVANCE) 5-500 MG per tablet Take 2 tablets by mouth 2 (two) times daily.  . [DISCONTINUED] hydrochlorothiazide  (MICROZIDE) 12.5 MG capsule Take 1 capsule (12.5 mg total) by mouth daily.  . [DISCONTINUED] mupirocin ointment (BACTROBAN) 2 % Apply 1 application topically 3 (three) times daily.  . [DISCONTINUED] nitrofurantoin, macrocrystal-monohydrate, (MACROBID) 100 MG capsule Take 1 capsule (100 mg total) by mouth 2 (two) times daily.  . [DISCONTINUED] NON FORMULARY Place 1 mL vaginally 2 (two) times a week. Reported on 05/02/2015  . [DISCONTINUED] OVER THE COUNTER MEDICATION Apply 1 application topically as needed (for skin). Emu Aid   No facility-administered encounter medications on file as of 02/19/2017.     Allergies as of 02/19/2017 - Review Complete 02/19/2017  Allergen Reaction Noted  . Coffee flavor Other (See Comments) 04/19/2014  . Cephalexin Other (See Comments) 03/01/2011  . Ciprofloxacin Other (See Comments) 03/01/2011  . Oatmeal Other (See Comments) 04/19/2014  . Sulfa antibiotics Other (See Comments) 03/01/2011    Past Medical History:  Diagnosis Date  . Anemia   . Cystocele   . DJD (degenerative joint disease)   . Esophageal stricture   . Essential hypertension   . Hyperlipidemia   . Hypothyroidism   . Lichen sclerosus   . Osteopenia   . RBBB with left anterior fascicular block   . Rectocele   . Stress incontinence   . Torn rotator cuff    Bilateral  . Type 2 diabetes mellitus ([place])   . Venous insufficiency     Past Surgical History:  Procedure Laterality Date  . ABDOMINAL EXPOSURE N/A 05/09/2014   Procedure: ABDOMINAL EXPOSURE;  Surgeon: Rosetta Posner,  MD;  Location: [facility];  Service: Vascular;  Laterality: N/A;  . ANTERIOR LAT LUMBAR FUSION Right 05/09/2014   Procedure: Lumbar three-four, Lumbar four-five Anterior lateral lumbar fusion;  Surgeon: Erline Levine, MD;  Location: [facility];  Service: Neurosurgery;  Laterality: Right;  . ANTERIOR LUMBAR FUSION N/A 05/09/2014   Procedure: Stage 1:Lumbar four-five, Lumbar five-Sacral one Anterior lumbar interbody  fusion with Dr. Donnetta Hutching for approach ;  Surgeon: Erline Levine, MD;  Location: [place] [facility];  Service: Neurosurgery;  Laterality: N/A;  . CHOLECYSTECTOMY  1987  . INCONTINENCE SURGERY    . LUMBAR PERCUTANEOUS PEDICLE SCREW 3 LEVEL N/A 05/10/2014   Procedure: Stage 2: Percutaneous Pedicle Screws; Laminectomy L3-4  ;  Surgeon: Erline Levine, MD;  Location: [facility];  Service: Neurosurgery;  Laterality: N/A;  Stage 2: Percutaneous Pedicle Screws T10 to Ileum; Laminectomy L3-4    . RECTAL SURGERY    . VAGINA SURGERY  2018   Josph Macho Touch- Vaginal Dryness    Family History  Problem Relation Age of Onset  . Multiple myeloma Mother   . Cancer Maternal Aunt        Cancer in the mouth - dipped snuff  . Heart disease Brother   . Diabetes Father   . Colon polyps Brother   . Heart disease Maternal Uncle   . Heart disease Paternal Uncle   . Heart disease Paternal Grandfather     Social History   Socioeconomic History  . Marital status: Widowed    Spouse name: Not on file  . Number of children: 2  . Years of education: Not on file  . Highest education level: Not on file  Social Needs  . Financial resource strain: Not on file  . Food insecurity - worry: Not on file  . Food insecurity - inability: Not on file  . Transportation needs - medical: Not on file  . Transportation needs - non-medical: Not on file  Occupational History  . Occupation: Retired  Tobacco Use  . Smoking status: Never Smoker  . Smokeless tobacco: Never Used  Substance and Sexual Activity  . Alcohol use: No  . Drug use: Yes    Types: Hydromorphone  . Sexual activity: Not on file  Other Topics Concern  . Not on file  Social History Narrative  . Not on file      Review of systems: Review of Systems  Constitutional: Negative for fever and chills.  HENT: Negative.   Eyes: Negative for blurred vision.  Respiratory: Negative for cough, shortness of breath and wheezing.   Cardiovascular: Negative for chest  pain and palpitations.  Gastrointestinal: as per HPI Genitourinary: Negative for dysuria, urgency, frequency and hematuria.  Musculoskeletal: Positive for myalgias, back pain and joint pain.  Skin: Negative for itching and rash.  Neurological: Negative for dizziness, tremors, focal weakness, seizures and loss of consciousness.  Endo/Heme/Allergies: Positive for seasonal allergies.  Psychiatric/Behavioral: Negative for depression, suicidal ideas and hallucinations. Positive for anxiety All other systems reviewed and are negative.   Physical Exam: Vitals:   02/19/17 1015  BP: 112/78  Pulse: 74   Body mass index is 35.86 kg/m. Gen:      No acute distress HEENT:  EOMI, sclera anicteric Neck:     No masses; no thyromegaly Lungs:    Clear to auscultation bilaterally; normal respiratory effort CV:         Regular rate and rhythm; no murmurs Abd:      + bowel sounds;  soft, non-tender; no palpable masses, no distension. Large abdominal pannus with ventral hernia.  Ext:    No edema; adequate peripheral perfusion Skin:      Warm and dry; no rash Neuro: alert and oriented x 3 Psych: normal mood and affect  Data Reviewed:  Reviewed labs, radiology imaging, old records and pertinent past GI work up   Assessment and Plan/Recommendations: 72 year old female with fecal incontinence, change in stool consistency with pasty stool.  She is due for colorectal cancer screening We will schedule for colonoscopy The risks and benefits as well as alternatives of endoscopic procedure(s) have been discussed and reviewed. All questions answered. The patient agrees to proceed. Start Benefiber 1 tablespoon 3 times daily with meals Kegel exercises If Continues to have fecal incontinence will consider anorectal manometry and pelvic floor physical therapy    K. Denzil Magnuson , MD [phone] Mon-Fri 8a-5p [phone] after 5p, weekends, holidays  CC: Avva, Ravisankar, MD

## 2017-02-19 NOTE — Patient Instructions (Addendum)
You have been scheduled for a colonoscopy. Please follow written instructions given to you at your visit today.  Please pick up your prep supplies at the pharmacy within the next 1-3 days. If you use inhalers (even only as needed), please bring them with you on the day of your procedure.  We have given you a Suprep sample kit today    Separate instructions have been given on your diabetic medication    Kegel Exercises Kegel exercises help strengthen the muscles that support the rectum, vagina, small intestine, bladder, and uterus. Doing Kegel exercises can help:  Improve bladder and bowel control.  Improve sexual response.  Reduce problems and discomfort during pregnancy.  Kegel exercises involve squeezing your pelvic floor muscles, which are the same muscles you squeeze when you try to stop the flow of urine. The exercises can be done while sitting, standing, or lying down, but it is best to vary your position. Phase 1 exercises 1. Squeeze your pelvic floor muscles tight. You should feel a tight lift in your rectal area. If you are a female, you should also feel a tightness in your vaginal area. Keep your stomach, buttocks, and legs relaxed. 2. Hold the muscles tight for up to 10 seconds. 3. Relax your muscles. Repeat this exercise 50 times a day or as many times as told by your health care provider. Continue to do this exercise for at least 4-6 weeks or for as long as told by your health care provider. This information is not intended to replace advice given to you by your health care provider. Make sure you discuss any questions you have with your health care provider. Document Released: 12/23/2011 Document Revised: 08/31/2015 Document Reviewed: 11/25/2014 Elsevier Interactive Patient Education  2018 Elsevier Inc.  Take Benefiber 1 tablespoon three times a day with meals    Follow up as needed   Thank You,   Dr Dell Ponto, MD

## 2017-02-24 ENCOUNTER — Telehealth: Payer: Self-pay | Admitting: *Deleted

## 2017-02-24 NOTE — Telephone Encounter (Signed)
Calling to inform pt that it is ok that she take the [place] for her colonoscopy

## 2017-03-03 DIAGNOSIS — L821 Other seborrheic keratosis: Secondary | ICD-10-CM | POA: Diagnosis not present

## 2017-03-03 DIAGNOSIS — L570 Actinic keratosis: Secondary | ICD-10-CM | POA: Diagnosis not present

## 2017-03-03 DIAGNOSIS — D225 Melanocytic nevi of trunk: Secondary | ICD-10-CM | POA: Diagnosis not present

## 2017-03-04 ENCOUNTER — Encounter: Payer: Self-pay | Admitting: Gastroenterology

## 2017-03-16 ENCOUNTER — Encounter: Payer: Self-pay | Admitting: Gastroenterology

## 2017-04-22 DIAGNOSIS — Z6835 Body mass index (BMI) 35.0-35.9, adult: Secondary | ICD-10-CM | POA: Diagnosis not present

## 2017-04-22 DIAGNOSIS — Z1389 Encounter for screening for other disorder: Secondary | ICD-10-CM | POA: Diagnosis not present

## 2017-04-22 DIAGNOSIS — E668 Other obesity: Secondary | ICD-10-CM | POA: Diagnosis not present

## 2017-04-22 DIAGNOSIS — E1129 Type 2 diabetes mellitus with other diabetic kidney complication: Secondary | ICD-10-CM | POA: Diagnosis not present

## 2017-04-22 DIAGNOSIS — R197 Diarrhea, unspecified: Secondary | ICD-10-CM | POA: Diagnosis not present

## 2017-04-22 DIAGNOSIS — F39 Unspecified mood [affective] disorder: Secondary | ICD-10-CM | POA: Diagnosis not present

## 2017-04-22 DIAGNOSIS — N184 Chronic kidney disease, stage 4 (severe): Secondary | ICD-10-CM | POA: Diagnosis not present

## 2017-04-22 DIAGNOSIS — I129 Hypertensive chronic kidney disease with stage 1 through stage 4 chronic kidney disease, or unspecified chronic kidney disease: Secondary | ICD-10-CM | POA: Diagnosis not present

## 2017-05-03 DIAGNOSIS — N184 Chronic kidney disease, stage 4 (severe): Secondary | ICD-10-CM | POA: Diagnosis not present

## 2017-05-03 DIAGNOSIS — N2581 Secondary hyperparathyroidism of renal origin: Secondary | ICD-10-CM | POA: Diagnosis not present

## 2017-05-03 DIAGNOSIS — D631 Anemia in chronic kidney disease: Secondary | ICD-10-CM | POA: Diagnosis not present

## 2017-05-03 DIAGNOSIS — E1122 Type 2 diabetes mellitus with diabetic chronic kidney disease: Secondary | ICD-10-CM | POA: Diagnosis not present

## 2017-05-03 DIAGNOSIS — I10 Essential (primary) hypertension: Secondary | ICD-10-CM | POA: Diagnosis not present

## 2017-05-03 DIAGNOSIS — E669 Obesity, unspecified: Secondary | ICD-10-CM | POA: Diagnosis not present

## 2017-05-12 DIAGNOSIS — F39 Unspecified mood [affective] disorder: Secondary | ICD-10-CM | POA: Diagnosis not present

## 2017-05-26 DIAGNOSIS — F39 Unspecified mood [affective] disorder: Secondary | ICD-10-CM | POA: Diagnosis not present

## 2017-06-10 DIAGNOSIS — F39 Unspecified mood [affective] disorder: Secondary | ICD-10-CM | POA: Diagnosis not present

## 2017-06-16 DIAGNOSIS — E113293 Type 2 diabetes mellitus with mild nonproliferative diabetic retinopathy without macular edema, bilateral: Secondary | ICD-10-CM | POA: Diagnosis not present

## 2017-06-16 DIAGNOSIS — H43813 Vitreous degeneration, bilateral: Secondary | ICD-10-CM | POA: Diagnosis not present

## 2017-07-02 DIAGNOSIS — F39 Unspecified mood [affective] disorder: Secondary | ICD-10-CM | POA: Diagnosis not present

## 2017-07-12 DIAGNOSIS — M25531 Pain in right wrist: Secondary | ICD-10-CM | POA: Diagnosis not present

## 2017-08-12 DIAGNOSIS — E1129 Type 2 diabetes mellitus with other diabetic kidney complication: Secondary | ICD-10-CM | POA: Diagnosis not present

## 2017-08-12 DIAGNOSIS — M10072 Idiopathic gout, left ankle and foot: Secondary | ICD-10-CM | POA: Diagnosis not present

## 2017-08-12 DIAGNOSIS — N184 Chronic kidney disease, stage 4 (severe): Secondary | ICD-10-CM | POA: Diagnosis not present

## 2017-08-12 DIAGNOSIS — I10 Essential (primary) hypertension: Secondary | ICD-10-CM | POA: Diagnosis not present

## 2017-08-12 DIAGNOSIS — M25572 Pain in left ankle and joints of left foot: Secondary | ICD-10-CM | POA: Diagnosis not present

## 2017-08-12 DIAGNOSIS — M25532 Pain in left wrist: Secondary | ICD-10-CM | POA: Diagnosis not present

## 2017-08-20 DIAGNOSIS — F39 Unspecified mood [affective] disorder: Secondary | ICD-10-CM | POA: Diagnosis not present

## 2017-08-27 DIAGNOSIS — E668 Other obesity: Secondary | ICD-10-CM | POA: Diagnosis not present

## 2017-08-27 DIAGNOSIS — N184 Chronic kidney disease, stage 4 (severe): Secondary | ICD-10-CM | POA: Diagnosis not present

## 2017-08-27 DIAGNOSIS — F39 Unspecified mood [affective] disorder: Secondary | ICD-10-CM | POA: Diagnosis not present

## 2017-08-27 DIAGNOSIS — M10072 Idiopathic gout, left ankle and foot: Secondary | ICD-10-CM | POA: Diagnosis not present

## 2017-08-27 DIAGNOSIS — I129 Hypertensive chronic kidney disease with stage 1 through stage 4 chronic kidney disease, or unspecified chronic kidney disease: Secondary | ICD-10-CM | POA: Diagnosis not present

## 2017-08-27 DIAGNOSIS — Z6834 Body mass index (BMI) 34.0-34.9, adult: Secondary | ICD-10-CM | POA: Diagnosis not present

## 2017-08-27 DIAGNOSIS — E1129 Type 2 diabetes mellitus with other diabetic kidney complication: Secondary | ICD-10-CM | POA: Diagnosis not present

## 2017-08-30 DIAGNOSIS — L821 Other seborrheic keratosis: Secondary | ICD-10-CM | POA: Diagnosis not present

## 2017-08-30 DIAGNOSIS — L738 Other specified follicular disorders: Secondary | ICD-10-CM | POA: Diagnosis not present

## 2017-08-30 DIAGNOSIS — B078 Other viral warts: Secondary | ICD-10-CM | POA: Diagnosis not present

## 2017-09-06 DIAGNOSIS — N184 Chronic kidney disease, stage 4 (severe): Secondary | ICD-10-CM | POA: Diagnosis not present

## 2017-09-06 DIAGNOSIS — N189 Chronic kidney disease, unspecified: Secondary | ICD-10-CM | POA: Diagnosis not present

## 2017-09-06 DIAGNOSIS — I129 Hypertensive chronic kidney disease with stage 1 through stage 4 chronic kidney disease, or unspecified chronic kidney disease: Secondary | ICD-10-CM | POA: Diagnosis not present

## 2017-09-06 DIAGNOSIS — D631 Anemia in chronic kidney disease: Secondary | ICD-10-CM | POA: Diagnosis not present

## 2017-09-06 DIAGNOSIS — E1122 Type 2 diabetes mellitus with diabetic chronic kidney disease: Secondary | ICD-10-CM | POA: Diagnosis not present

## 2017-09-06 DIAGNOSIS — N2581 Secondary hyperparathyroidism of renal origin: Secondary | ICD-10-CM | POA: Diagnosis not present

## 2017-09-29 DIAGNOSIS — F39 Unspecified mood [affective] disorder: Secondary | ICD-10-CM | POA: Diagnosis not present

## 2017-11-03 DIAGNOSIS — F39 Unspecified mood [affective] disorder: Secondary | ICD-10-CM | POA: Diagnosis not present

## 2017-11-04 DIAGNOSIS — E871 Hypo-osmolality and hyponatremia: Secondary | ICD-10-CM | POA: Diagnosis not present

## 2017-11-04 DIAGNOSIS — N2581 Secondary hyperparathyroidism of renal origin: Secondary | ICD-10-CM | POA: Diagnosis not present

## 2017-11-04 DIAGNOSIS — E039 Hypothyroidism, unspecified: Secondary | ICD-10-CM | POA: Diagnosis not present

## 2017-11-04 DIAGNOSIS — M109 Gout, unspecified: Secondary | ICD-10-CM | POA: Diagnosis not present

## 2017-11-04 DIAGNOSIS — N189 Chronic kidney disease, unspecified: Secondary | ICD-10-CM | POA: Diagnosis not present

## 2017-11-04 DIAGNOSIS — I129 Hypertensive chronic kidney disease with stage 1 through stage 4 chronic kidney disease, or unspecified chronic kidney disease: Secondary | ICD-10-CM | POA: Diagnosis not present

## 2017-11-04 DIAGNOSIS — N184 Chronic kidney disease, stage 4 (severe): Secondary | ICD-10-CM | POA: Diagnosis not present

## 2017-11-04 DIAGNOSIS — E1122 Type 2 diabetes mellitus with diabetic chronic kidney disease: Secondary | ICD-10-CM | POA: Diagnosis not present

## 2017-11-04 DIAGNOSIS — D631 Anemia in chronic kidney disease: Secondary | ICD-10-CM | POA: Diagnosis not present

## 2017-11-04 DIAGNOSIS — E669 Obesity, unspecified: Secondary | ICD-10-CM | POA: Diagnosis not present

## 2017-11-25 DIAGNOSIS — F39 Unspecified mood [affective] disorder: Secondary | ICD-10-CM | POA: Diagnosis not present

## 2017-12-15 DIAGNOSIS — F39 Unspecified mood [affective] disorder: Secondary | ICD-10-CM | POA: Diagnosis not present

## 2017-12-23 DIAGNOSIS — M10072 Idiopathic gout, left ankle and foot: Secondary | ICD-10-CM | POA: Diagnosis not present

## 2017-12-23 DIAGNOSIS — E1129 Type 2 diabetes mellitus with other diabetic kidney complication: Secondary | ICD-10-CM | POA: Diagnosis not present

## 2017-12-23 DIAGNOSIS — M859 Disorder of bone density and structure, unspecified: Secondary | ICD-10-CM | POA: Diagnosis not present

## 2017-12-23 DIAGNOSIS — H43813 Vitreous degeneration, bilateral: Secondary | ICD-10-CM | POA: Diagnosis not present

## 2017-12-23 DIAGNOSIS — E038 Other specified hypothyroidism: Secondary | ICD-10-CM | POA: Diagnosis not present

## 2017-12-23 DIAGNOSIS — E7849 Other hyperlipidemia: Secondary | ICD-10-CM | POA: Diagnosis not present

## 2017-12-23 DIAGNOSIS — E113213 Type 2 diabetes mellitus with mild nonproliferative diabetic retinopathy with macular edema, bilateral: Secondary | ICD-10-CM | POA: Diagnosis not present

## 2017-12-23 DIAGNOSIS — R82998 Other abnormal findings in urine: Secondary | ICD-10-CM | POA: Diagnosis not present

## 2017-12-24 DIAGNOSIS — E038 Other specified hypothyroidism: Secondary | ICD-10-CM | POA: Diagnosis not present

## 2017-12-24 DIAGNOSIS — E1129 Type 2 diabetes mellitus with other diabetic kidney complication: Secondary | ICD-10-CM | POA: Diagnosis not present

## 2017-12-24 DIAGNOSIS — M10072 Idiopathic gout, left ankle and foot: Secondary | ICD-10-CM | POA: Diagnosis not present

## 2017-12-24 DIAGNOSIS — N184 Chronic kidney disease, stage 4 (severe): Secondary | ICD-10-CM | POA: Diagnosis not present

## 2017-12-24 DIAGNOSIS — E11319 Type 2 diabetes mellitus with unspecified diabetic retinopathy without macular edema: Secondary | ICD-10-CM | POA: Diagnosis not present

## 2017-12-24 DIAGNOSIS — R0989 Other specified symptoms and signs involving the circulatory and respiratory systems: Secondary | ICD-10-CM | POA: Diagnosis not present

## 2017-12-24 DIAGNOSIS — F39 Unspecified mood [affective] disorder: Secondary | ICD-10-CM | POA: Diagnosis not present

## 2017-12-24 DIAGNOSIS — Z6835 Body mass index (BMI) 35.0-35.9, adult: Secondary | ICD-10-CM | POA: Diagnosis not present

## 2017-12-24 DIAGNOSIS — E668 Other obesity: Secondary | ICD-10-CM | POA: Diagnosis not present

## 2017-12-24 DIAGNOSIS — E7849 Other hyperlipidemia: Secondary | ICD-10-CM | POA: Diagnosis not present

## 2017-12-24 DIAGNOSIS — M859 Disorder of bone density and structure, unspecified: Secondary | ICD-10-CM | POA: Diagnosis not present

## 2017-12-24 DIAGNOSIS — I129 Hypertensive chronic kidney disease with stage 1 through stage 4 chronic kidney disease, or unspecified chronic kidney disease: Secondary | ICD-10-CM | POA: Diagnosis not present

## 2017-12-29 DIAGNOSIS — L904 Acrodermatitis chronica atrophicans: Secondary | ICD-10-CM | POA: Diagnosis not present

## 2017-12-29 DIAGNOSIS — Z01419 Encounter for gynecological examination (general) (routine) without abnormal findings: Secondary | ICD-10-CM | POA: Diagnosis not present

## 2018-01-05 DIAGNOSIS — N184 Chronic kidney disease, stage 4 (severe): Secondary | ICD-10-CM | POA: Diagnosis not present

## 2018-01-05 DIAGNOSIS — D631 Anemia in chronic kidney disease: Secondary | ICD-10-CM | POA: Diagnosis not present

## 2018-01-05 DIAGNOSIS — E871 Hypo-osmolality and hyponatremia: Secondary | ICD-10-CM | POA: Diagnosis not present

## 2018-01-05 DIAGNOSIS — F39 Unspecified mood [affective] disorder: Secondary | ICD-10-CM | POA: Diagnosis not present

## 2018-01-05 DIAGNOSIS — E039 Hypothyroidism, unspecified: Secondary | ICD-10-CM | POA: Diagnosis not present

## 2018-01-05 DIAGNOSIS — I129 Hypertensive chronic kidney disease with stage 1 through stage 4 chronic kidney disease, or unspecified chronic kidney disease: Secondary | ICD-10-CM | POA: Diagnosis not present

## 2018-01-05 DIAGNOSIS — E669 Obesity, unspecified: Secondary | ICD-10-CM | POA: Diagnosis not present

## 2018-01-05 DIAGNOSIS — E1122 Type 2 diabetes mellitus with diabetic chronic kidney disease: Secondary | ICD-10-CM | POA: Diagnosis not present

## 2018-01-05 DIAGNOSIS — N2581 Secondary hyperparathyroidism of renal origin: Secondary | ICD-10-CM | POA: Diagnosis not present

## 2018-01-05 DIAGNOSIS — M109 Gout, unspecified: Secondary | ICD-10-CM | POA: Diagnosis not present

## 2018-01-21 DIAGNOSIS — N184 Chronic kidney disease, stage 4 (severe): Secondary | ICD-10-CM | POA: Diagnosis not present

## 2018-01-21 DIAGNOSIS — E871 Hypo-osmolality and hyponatremia: Secondary | ICD-10-CM | POA: Diagnosis not present

## 2018-01-24 ENCOUNTER — Other Ambulatory Visit: Payer: Self-pay | Admitting: Internal Medicine

## 2018-01-24 DIAGNOSIS — Z1231 Encounter for screening mammogram for malignant neoplasm of breast: Secondary | ICD-10-CM

## 2018-02-02 DIAGNOSIS — F39 Unspecified mood [affective] disorder: Secondary | ICD-10-CM | POA: Diagnosis not present

## 2018-02-09 DIAGNOSIS — Z6836 Body mass index (BMI) 36.0-36.9, adult: Secondary | ICD-10-CM | POA: Diagnosis not present

## 2018-02-09 DIAGNOSIS — F39 Unspecified mood [affective] disorder: Secondary | ICD-10-CM | POA: Diagnosis not present

## 2018-02-09 DIAGNOSIS — E669 Obesity, unspecified: Secondary | ICD-10-CM | POA: Diagnosis not present

## 2018-02-09 DIAGNOSIS — E1129 Type 2 diabetes mellitus with other diabetic kidney complication: Secondary | ICD-10-CM | POA: Diagnosis not present

## 2018-02-09 DIAGNOSIS — I129 Hypertensive chronic kidney disease with stage 1 through stage 4 chronic kidney disease, or unspecified chronic kidney disease: Secondary | ICD-10-CM | POA: Diagnosis not present

## 2018-02-09 DIAGNOSIS — N184 Chronic kidney disease, stage 4 (severe): Secondary | ICD-10-CM | POA: Diagnosis not present

## 2018-02-10 ENCOUNTER — Ambulatory Visit: Payer: Self-pay

## 2018-03-03 DIAGNOSIS — D1722 Benign lipomatous neoplasm of skin and subcutaneous tissue of left arm: Secondary | ICD-10-CM | POA: Diagnosis not present

## 2018-03-03 DIAGNOSIS — D225 Melanocytic nevi of trunk: Secondary | ICD-10-CM | POA: Diagnosis not present

## 2018-03-03 DIAGNOSIS — L821 Other seborrheic keratosis: Secondary | ICD-10-CM | POA: Diagnosis not present

## 2018-03-03 DIAGNOSIS — L814 Other melanin hyperpigmentation: Secondary | ICD-10-CM | POA: Diagnosis not present

## 2018-03-09 ENCOUNTER — Ambulatory Visit
Admission: RE | Admit: 2018-03-09 | Discharge: 2018-03-09 | Disposition: A | Discharge status: Home / Self Care | Payer: PPO | Source: Ambulatory Visit | Attending: Internal Medicine | Admitting: Internal Medicine

## 2018-03-09 DIAGNOSIS — Z1231 Encounter for screening mammogram for malignant neoplasm of breast: Secondary | ICD-10-CM | POA: Diagnosis not present

## 2018-03-09 DIAGNOSIS — F39 Unspecified mood [affective] disorder: Secondary | ICD-10-CM | POA: Diagnosis not present

## 2018-03-11 DIAGNOSIS — Z1212 Encounter for screening for malignant neoplasm of rectum: Secondary | ICD-10-CM | POA: Diagnosis not present

## 2018-03-31 DIAGNOSIS — L900 Lichen sclerosus et atrophicus: Secondary | ICD-10-CM | POA: Diagnosis not present

## 2018-03-31 DIAGNOSIS — N762 Acute vulvitis: Secondary | ICD-10-CM | POA: Diagnosis not present

## 2018-04-06 DIAGNOSIS — E871 Hypo-osmolality and hyponatremia: Secondary | ICD-10-CM | POA: Diagnosis not present

## 2018-04-06 DIAGNOSIS — D631 Anemia in chronic kidney disease: Secondary | ICD-10-CM | POA: Diagnosis not present

## 2018-04-06 DIAGNOSIS — R809 Proteinuria, unspecified: Secondary | ICD-10-CM | POA: Diagnosis not present

## 2018-04-06 DIAGNOSIS — M109 Gout, unspecified: Secondary | ICD-10-CM | POA: Diagnosis not present

## 2018-04-06 DIAGNOSIS — E039 Hypothyroidism, unspecified: Secondary | ICD-10-CM | POA: Diagnosis not present

## 2018-04-06 DIAGNOSIS — E669 Obesity, unspecified: Secondary | ICD-10-CM | POA: Diagnosis not present

## 2018-04-06 DIAGNOSIS — N2581 Secondary hyperparathyroidism of renal origin: Secondary | ICD-10-CM | POA: Diagnosis not present

## 2018-04-06 DIAGNOSIS — I129 Hypertensive chronic kidney disease with stage 1 through stage 4 chronic kidney disease, or unspecified chronic kidney disease: Secondary | ICD-10-CM | POA: Diagnosis not present

## 2018-04-06 DIAGNOSIS — E1122 Type 2 diabetes mellitus with diabetic chronic kidney disease: Secondary | ICD-10-CM | POA: Diagnosis not present

## 2018-04-06 DIAGNOSIS — N184 Chronic kidney disease, stage 4 (severe): Secondary | ICD-10-CM | POA: Diagnosis not present

## 2018-04-25 DIAGNOSIS — E669 Obesity, unspecified: Secondary | ICD-10-CM | POA: Diagnosis not present

## 2018-04-25 DIAGNOSIS — N184 Chronic kidney disease, stage 4 (severe): Secondary | ICD-10-CM | POA: Diagnosis not present

## 2018-04-25 DIAGNOSIS — R531 Weakness: Secondary | ICD-10-CM | POA: Diagnosis not present

## 2018-04-25 DIAGNOSIS — I129 Hypertensive chronic kidney disease with stage 1 through stage 4 chronic kidney disease, or unspecified chronic kidney disease: Secondary | ICD-10-CM | POA: Diagnosis not present

## 2018-04-25 DIAGNOSIS — Z1331 Encounter for screening for depression: Secondary | ICD-10-CM | POA: Diagnosis not present

## 2018-04-25 DIAGNOSIS — E1129 Type 2 diabetes mellitus with other diabetic kidney complication: Secondary | ICD-10-CM | POA: Diagnosis not present

## 2018-04-25 DIAGNOSIS — F39 Unspecified mood [affective] disorder: Secondary | ICD-10-CM | POA: Diagnosis not present

## 2018-04-25 DIAGNOSIS — E039 Hypothyroidism, unspecified: Secondary | ICD-10-CM | POA: Diagnosis not present

## 2018-05-05 DIAGNOSIS — F39 Unspecified mood [affective] disorder: Secondary | ICD-10-CM | POA: Diagnosis not present

## 2018-05-26 DIAGNOSIS — F39 Unspecified mood [affective] disorder: Secondary | ICD-10-CM | POA: Diagnosis not present

## 2018-06-06 DIAGNOSIS — D485 Neoplasm of uncertain behavior of skin: Secondary | ICD-10-CM | POA: Diagnosis not present

## 2018-06-06 DIAGNOSIS — L570 Actinic keratosis: Secondary | ICD-10-CM | POA: Diagnosis not present

## 2018-06-27 DIAGNOSIS — F39 Unspecified mood [affective] disorder: Secondary | ICD-10-CM | POA: Diagnosis not present

## 2018-06-30 DIAGNOSIS — H3561 Retinal hemorrhage, right eye: Secondary | ICD-10-CM | POA: Diagnosis not present

## 2018-06-30 DIAGNOSIS — H2511 Age-related nuclear cataract, right eye: Secondary | ICD-10-CM | POA: Diagnosis not present

## 2018-06-30 DIAGNOSIS — E113412 Type 2 diabetes mellitus with severe nonproliferative diabetic retinopathy with macular edema, left eye: Secondary | ICD-10-CM | POA: Diagnosis not present

## 2018-06-30 DIAGNOSIS — E113411 Type 2 diabetes mellitus with severe nonproliferative diabetic retinopathy with macular edema, right eye: Secondary | ICD-10-CM | POA: Diagnosis not present

## 2018-07-06 DIAGNOSIS — E113512 Type 2 diabetes mellitus with proliferative diabetic retinopathy with macular edema, left eye: Secondary | ICD-10-CM | POA: Diagnosis not present

## 2018-07-06 DIAGNOSIS — E113511 Type 2 diabetes mellitus with proliferative diabetic retinopathy with macular edema, right eye: Secondary | ICD-10-CM | POA: Diagnosis not present

## 2018-07-11 DIAGNOSIS — R42 Dizziness and giddiness: Secondary | ICD-10-CM | POA: Diagnosis not present

## 2018-07-11 DIAGNOSIS — I129 Hypertensive chronic kidney disease with stage 1 through stage 4 chronic kidney disease, or unspecified chronic kidney disease: Secondary | ICD-10-CM | POA: Diagnosis not present

## 2018-07-11 DIAGNOSIS — E1129 Type 2 diabetes mellitus with other diabetic kidney complication: Secondary | ICD-10-CM | POA: Diagnosis not present

## 2018-07-11 DIAGNOSIS — N184 Chronic kidney disease, stage 4 (severe): Secondary | ICD-10-CM | POA: Diagnosis not present

## 2018-07-11 DIAGNOSIS — J302 Other seasonal allergic rhinitis: Secondary | ICD-10-CM | POA: Diagnosis not present

## 2018-07-12 DIAGNOSIS — E113512 Type 2 diabetes mellitus with proliferative diabetic retinopathy with macular edema, left eye: Secondary | ICD-10-CM | POA: Diagnosis not present

## 2018-08-03 DIAGNOSIS — F39 Unspecified mood [affective] disorder: Secondary | ICD-10-CM | POA: Diagnosis not present

## 2018-08-04 DIAGNOSIS — E113511 Type 2 diabetes mellitus with proliferative diabetic retinopathy with macular edema, right eye: Secondary | ICD-10-CM | POA: Diagnosis not present

## 2018-08-06 DIAGNOSIS — H40033 Anatomical narrow angle, bilateral: Secondary | ICD-10-CM | POA: Diagnosis not present

## 2018-08-06 DIAGNOSIS — H2513 Age-related nuclear cataract, bilateral: Secondary | ICD-10-CM | POA: Diagnosis not present

## 2018-08-06 DIAGNOSIS — E113553 Type 2 diabetes mellitus with stable proliferative diabetic retinopathy, bilateral: Secondary | ICD-10-CM | POA: Diagnosis not present

## 2018-08-09 DIAGNOSIS — E113512 Type 2 diabetes mellitus with proliferative diabetic retinopathy with macular edema, left eye: Secondary | ICD-10-CM | POA: Diagnosis not present

## 2018-08-17 DIAGNOSIS — I129 Hypertensive chronic kidney disease with stage 1 through stage 4 chronic kidney disease, or unspecified chronic kidney disease: Secondary | ICD-10-CM | POA: Diagnosis not present

## 2018-08-17 DIAGNOSIS — E1122 Type 2 diabetes mellitus with diabetic chronic kidney disease: Secondary | ICD-10-CM | POA: Diagnosis not present

## 2018-08-17 DIAGNOSIS — E039 Hypothyroidism, unspecified: Secondary | ICD-10-CM | POA: Diagnosis not present

## 2018-08-17 DIAGNOSIS — N184 Chronic kidney disease, stage 4 (severe): Secondary | ICD-10-CM | POA: Diagnosis not present

## 2018-08-17 DIAGNOSIS — D631 Anemia in chronic kidney disease: Secondary | ICD-10-CM | POA: Diagnosis not present

## 2018-08-17 DIAGNOSIS — R809 Proteinuria, unspecified: Secondary | ICD-10-CM | POA: Diagnosis not present

## 2018-08-17 DIAGNOSIS — M109 Gout, unspecified: Secondary | ICD-10-CM | POA: Diagnosis not present

## 2018-08-17 DIAGNOSIS — N2581 Secondary hyperparathyroidism of renal origin: Secondary | ICD-10-CM | POA: Diagnosis not present

## 2018-08-17 DIAGNOSIS — E871 Hypo-osmolality and hyponatremia: Secondary | ICD-10-CM | POA: Diagnosis not present

## 2018-08-17 DIAGNOSIS — E669 Obesity, unspecified: Secondary | ICD-10-CM | POA: Diagnosis not present

## 2018-08-31 DIAGNOSIS — I10 Essential (primary) hypertension: Secondary | ICD-10-CM | POA: Diagnosis not present

## 2018-08-31 DIAGNOSIS — N184 Chronic kidney disease, stage 4 (severe): Secondary | ICD-10-CM | POA: Diagnosis not present

## 2018-09-05 DIAGNOSIS — H25813 Combined forms of age-related cataract, bilateral: Secondary | ICD-10-CM | POA: Diagnosis not present

## 2018-09-05 DIAGNOSIS — H43813 Vitreous degeneration, bilateral: Secondary | ICD-10-CM | POA: Diagnosis not present

## 2018-09-05 DIAGNOSIS — E113213 Type 2 diabetes mellitus with mild nonproliferative diabetic retinopathy with macular edema, bilateral: Secondary | ICD-10-CM | POA: Diagnosis not present

## 2018-09-05 DIAGNOSIS — H52203 Unspecified astigmatism, bilateral: Secondary | ICD-10-CM | POA: Diagnosis not present

## 2018-09-08 DIAGNOSIS — E113591 Type 2 diabetes mellitus with proliferative diabetic retinopathy without macular edema, right eye: Secondary | ICD-10-CM | POA: Diagnosis not present

## 2018-09-08 DIAGNOSIS — H2511 Age-related nuclear cataract, right eye: Secondary | ICD-10-CM | POA: Diagnosis not present

## 2018-09-08 DIAGNOSIS — E113592 Type 2 diabetes mellitus with proliferative diabetic retinopathy without macular edema, left eye: Secondary | ICD-10-CM | POA: Diagnosis not present

## 2018-09-08 DIAGNOSIS — H3561 Retinal hemorrhage, right eye: Secondary | ICD-10-CM | POA: Diagnosis not present

## 2018-09-13 DIAGNOSIS — E1129 Type 2 diabetes mellitus with other diabetic kidney complication: Secondary | ICD-10-CM | POA: Diagnosis not present

## 2018-09-13 DIAGNOSIS — E039 Hypothyroidism, unspecified: Secondary | ICD-10-CM | POA: Diagnosis not present

## 2018-09-13 DIAGNOSIS — E669 Obesity, unspecified: Secondary | ICD-10-CM | POA: Diagnosis not present

## 2018-09-13 DIAGNOSIS — N184 Chronic kidney disease, stage 4 (severe): Secondary | ICD-10-CM | POA: Diagnosis not present

## 2018-09-13 DIAGNOSIS — H35069 Retinal vasculitis, unspecified eye: Secondary | ICD-10-CM | POA: Diagnosis not present

## 2018-09-13 DIAGNOSIS — I129 Hypertensive chronic kidney disease with stage 1 through stage 4 chronic kidney disease, or unspecified chronic kidney disease: Secondary | ICD-10-CM | POA: Diagnosis not present

## 2018-09-13 DIAGNOSIS — F39 Unspecified mood [affective] disorder: Secondary | ICD-10-CM | POA: Diagnosis not present

## 2018-09-13 DIAGNOSIS — R42 Dizziness and giddiness: Secondary | ICD-10-CM | POA: Diagnosis not present

## 2018-10-03 DIAGNOSIS — F39 Unspecified mood [affective] disorder: Secondary | ICD-10-CM | POA: Diagnosis not present

## 2018-10-06 DIAGNOSIS — R42 Dizziness and giddiness: Secondary | ICD-10-CM | POA: Diagnosis not present

## 2018-10-06 DIAGNOSIS — H6123 Impacted cerumen, bilateral: Secondary | ICD-10-CM | POA: Diagnosis not present

## 2018-10-24 DIAGNOSIS — F39 Unspecified mood [affective] disorder: Secondary | ICD-10-CM | POA: Diagnosis not present

## 2018-11-14 DIAGNOSIS — B078 Other viral warts: Secondary | ICD-10-CM | POA: Diagnosis not present

## 2018-11-14 DIAGNOSIS — L249 Irritant contact dermatitis, unspecified cause: Secondary | ICD-10-CM | POA: Diagnosis not present

## 2018-11-29 DIAGNOSIS — H268 Other specified cataract: Secondary | ICD-10-CM | POA: Diagnosis not present

## 2018-11-29 DIAGNOSIS — H52201 Unspecified astigmatism, right eye: Secondary | ICD-10-CM | POA: Diagnosis not present

## 2018-11-29 DIAGNOSIS — H25811 Combined forms of age-related cataract, right eye: Secondary | ICD-10-CM | POA: Diagnosis not present

## 2018-12-05 DIAGNOSIS — F39 Unspecified mood [affective] disorder: Secondary | ICD-10-CM | POA: Diagnosis not present

## 2018-12-28 DIAGNOSIS — E1129 Type 2 diabetes mellitus with other diabetic kidney complication: Secondary | ICD-10-CM | POA: Diagnosis not present

## 2018-12-28 DIAGNOSIS — M10072 Idiopathic gout, left ankle and foot: Secondary | ICD-10-CM | POA: Diagnosis not present

## 2018-12-28 DIAGNOSIS — M859 Disorder of bone density and structure, unspecified: Secondary | ICD-10-CM | POA: Diagnosis not present

## 2018-12-28 DIAGNOSIS — E7849 Other hyperlipidemia: Secondary | ICD-10-CM | POA: Diagnosis not present

## 2018-12-28 DIAGNOSIS — E038 Other specified hypothyroidism: Secondary | ICD-10-CM | POA: Diagnosis not present

## 2018-12-29 DIAGNOSIS — N2581 Secondary hyperparathyroidism of renal origin: Secondary | ICD-10-CM | POA: Diagnosis not present

## 2018-12-29 DIAGNOSIS — D631 Anemia in chronic kidney disease: Secondary | ICD-10-CM | POA: Diagnosis not present

## 2018-12-29 DIAGNOSIS — N189 Chronic kidney disease, unspecified: Secondary | ICD-10-CM | POA: Diagnosis not present

## 2018-12-29 DIAGNOSIS — E1122 Type 2 diabetes mellitus with diabetic chronic kidney disease: Secondary | ICD-10-CM | POA: Diagnosis not present

## 2018-12-29 DIAGNOSIS — N184 Chronic kidney disease, stage 4 (severe): Secondary | ICD-10-CM | POA: Diagnosis not present

## 2018-12-29 DIAGNOSIS — R809 Proteinuria, unspecified: Secondary | ICD-10-CM | POA: Diagnosis not present

## 2018-12-29 DIAGNOSIS — E039 Hypothyroidism, unspecified: Secondary | ICD-10-CM | POA: Diagnosis not present

## 2018-12-29 DIAGNOSIS — E871 Hypo-osmolality and hyponatremia: Secondary | ICD-10-CM | POA: Diagnosis not present

## 2018-12-29 DIAGNOSIS — E669 Obesity, unspecified: Secondary | ICD-10-CM | POA: Diagnosis not present

## 2018-12-29 DIAGNOSIS — M109 Gout, unspecified: Secondary | ICD-10-CM | POA: Diagnosis not present

## 2018-12-29 DIAGNOSIS — I129 Hypertensive chronic kidney disease with stage 1 through stage 4 chronic kidney disease, or unspecified chronic kidney disease: Secondary | ICD-10-CM | POA: Diagnosis not present

## 2019-01-03 DIAGNOSIS — Z01419 Encounter for gynecological examination (general) (routine) without abnormal findings: Secondary | ICD-10-CM | POA: Diagnosis not present

## 2019-01-03 DIAGNOSIS — L900 Lichen sclerosus et atrophicus: Secondary | ICD-10-CM | POA: Diagnosis not present

## 2019-01-03 DIAGNOSIS — Z6837 Body mass index (BMI) 37.0-37.9, adult: Secondary | ICD-10-CM | POA: Diagnosis not present

## 2019-01-03 DIAGNOSIS — N952 Postmenopausal atrophic vaginitis: Secondary | ICD-10-CM | POA: Diagnosis not present

## 2019-01-04 DIAGNOSIS — H35069 Retinal vasculitis, unspecified eye: Secondary | ICD-10-CM | POA: Diagnosis not present

## 2019-01-04 DIAGNOSIS — I129 Hypertensive chronic kidney disease with stage 1 through stage 4 chronic kidney disease, or unspecified chronic kidney disease: Secondary | ICD-10-CM | POA: Diagnosis not present

## 2019-01-04 DIAGNOSIS — Z0000 Encounter for general adult medical examination without abnormal findings: Secondary | ICD-10-CM | POA: Diagnosis not present

## 2019-01-04 DIAGNOSIS — E039 Hypothyroidism, unspecified: Secondary | ICD-10-CM | POA: Diagnosis not present

## 2019-01-04 DIAGNOSIS — M8580 Other specified disorders of bone density and structure, unspecified site: Secondary | ICD-10-CM | POA: Diagnosis not present

## 2019-01-04 DIAGNOSIS — N184 Chronic kidney disease, stage 4 (severe): Secondary | ICD-10-CM | POA: Diagnosis not present

## 2019-01-04 DIAGNOSIS — F39 Unspecified mood [affective] disorder: Secondary | ICD-10-CM | POA: Diagnosis not present

## 2019-01-04 DIAGNOSIS — E1129 Type 2 diabetes mellitus with other diabetic kidney complication: Secondary | ICD-10-CM | POA: Diagnosis not present

## 2019-01-04 DIAGNOSIS — M10072 Idiopathic gout, left ankle and foot: Secondary | ICD-10-CM | POA: Diagnosis not present

## 2019-01-04 DIAGNOSIS — E785 Hyperlipidemia, unspecified: Secondary | ICD-10-CM | POA: Diagnosis not present

## 2019-01-04 DIAGNOSIS — E669 Obesity, unspecified: Secondary | ICD-10-CM | POA: Diagnosis not present

## 2019-01-04 DIAGNOSIS — R42 Dizziness and giddiness: Secondary | ICD-10-CM | POA: Diagnosis not present

## 2019-01-06 DIAGNOSIS — F39 Unspecified mood [affective] disorder: Secondary | ICD-10-CM | POA: Diagnosis not present

## 2019-01-11 DIAGNOSIS — N184 Chronic kidney disease, stage 4 (severe): Secondary | ICD-10-CM | POA: Diagnosis not present

## 2019-01-11 DIAGNOSIS — I10 Essential (primary) hypertension: Secondary | ICD-10-CM | POA: Diagnosis not present

## 2019-01-18 DIAGNOSIS — Z1212 Encounter for screening for malignant neoplasm of rectum: Secondary | ICD-10-CM | POA: Diagnosis not present

## 2019-01-18 DIAGNOSIS — H35371 Puckering of macula, right eye: Secondary | ICD-10-CM | POA: Diagnosis not present

## 2019-01-18 DIAGNOSIS — E113211 Type 2 diabetes mellitus with mild nonproliferative diabetic retinopathy with macular edema, right eye: Secondary | ICD-10-CM | POA: Diagnosis not present

## 2019-01-31 DIAGNOSIS — H52202 Unspecified astigmatism, left eye: Secondary | ICD-10-CM | POA: Diagnosis not present

## 2019-01-31 DIAGNOSIS — H25812 Combined forms of age-related cataract, left eye: Secondary | ICD-10-CM | POA: Diagnosis not present

## 2019-02-02 ENCOUNTER — Other Ambulatory Visit: Payer: Self-pay | Admitting: Internal Medicine

## 2019-02-02 DIAGNOSIS — Z1231 Encounter for screening mammogram for malignant neoplasm of breast: Secondary | ICD-10-CM

## 2019-02-16 DIAGNOSIS — H3561 Retinal hemorrhage, right eye: Secondary | ICD-10-CM | POA: Diagnosis not present

## 2019-02-16 DIAGNOSIS — E113591 Type 2 diabetes mellitus with proliferative diabetic retinopathy without macular edema, right eye: Secondary | ICD-10-CM | POA: Diagnosis not present

## 2019-02-16 DIAGNOSIS — E113512 Type 2 diabetes mellitus with proliferative diabetic retinopathy with macular edema, left eye: Secondary | ICD-10-CM | POA: Diagnosis not present

## 2019-02-16 DIAGNOSIS — Z961 Presence of intraocular lens: Secondary | ICD-10-CM | POA: Diagnosis not present

## 2019-02-23 DIAGNOSIS — E113591 Type 2 diabetes mellitus with proliferative diabetic retinopathy without macular edema, right eye: Secondary | ICD-10-CM | POA: Diagnosis not present

## 2019-02-23 DIAGNOSIS — E113512 Type 2 diabetes mellitus with proliferative diabetic retinopathy with macular edema, left eye: Secondary | ICD-10-CM | POA: Diagnosis not present

## 2019-03-02 DIAGNOSIS — E113591 Type 2 diabetes mellitus with proliferative diabetic retinopathy without macular edema, right eye: Secondary | ICD-10-CM | POA: Diagnosis not present

## 2019-03-08 DIAGNOSIS — D2261 Melanocytic nevi of right upper limb, including shoulder: Secondary | ICD-10-CM | POA: Diagnosis not present

## 2019-03-08 DIAGNOSIS — F39 Unspecified mood [affective] disorder: Secondary | ICD-10-CM | POA: Diagnosis not present

## 2019-03-08 DIAGNOSIS — D2262 Melanocytic nevi of left upper limb, including shoulder: Secondary | ICD-10-CM | POA: Diagnosis not present

## 2019-03-08 DIAGNOSIS — L821 Other seborrheic keratosis: Secondary | ICD-10-CM | POA: Diagnosis not present

## 2019-03-13 DIAGNOSIS — E113512 Type 2 diabetes mellitus with proliferative diabetic retinopathy with macular edema, left eye: Secondary | ICD-10-CM | POA: Diagnosis not present

## 2019-03-15 ENCOUNTER — Ambulatory Visit: Payer: PPO

## 2019-03-21 ENCOUNTER — Other Ambulatory Visit: Payer: Self-pay

## 2019-03-21 ENCOUNTER — Ambulatory Visit
Admission: RE | Admit: 2019-03-21 | Discharge: 2019-03-21 | Disposition: A | Discharge status: Home / Self Care | Payer: PPO | Source: Ambulatory Visit | Attending: Internal Medicine | Admitting: Internal Medicine

## 2019-03-21 DIAGNOSIS — Z1231 Encounter for screening mammogram for malignant neoplasm of breast: Secondary | ICD-10-CM

## 2019-04-12 DIAGNOSIS — M109 Gout, unspecified: Secondary | ICD-10-CM | POA: Diagnosis not present

## 2019-04-12 DIAGNOSIS — E871 Hypo-osmolality and hyponatremia: Secondary | ICD-10-CM | POA: Diagnosis not present

## 2019-04-12 DIAGNOSIS — D631 Anemia in chronic kidney disease: Secondary | ICD-10-CM | POA: Diagnosis not present

## 2019-04-12 DIAGNOSIS — E669 Obesity, unspecified: Secondary | ICD-10-CM | POA: Diagnosis not present

## 2019-04-12 DIAGNOSIS — I10 Essential (primary) hypertension: Secondary | ICD-10-CM | POA: Diagnosis not present

## 2019-04-12 DIAGNOSIS — N2581 Secondary hyperparathyroidism of renal origin: Secondary | ICD-10-CM | POA: Diagnosis not present

## 2019-04-12 DIAGNOSIS — E039 Hypothyroidism, unspecified: Secondary | ICD-10-CM | POA: Diagnosis not present

## 2019-04-12 DIAGNOSIS — N184 Chronic kidney disease, stage 4 (severe): Secondary | ICD-10-CM | POA: Diagnosis not present

## 2019-04-12 DIAGNOSIS — R809 Proteinuria, unspecified: Secondary | ICD-10-CM | POA: Diagnosis not present

## 2019-04-12 DIAGNOSIS — E1122 Type 2 diabetes mellitus with diabetic chronic kidney disease: Secondary | ICD-10-CM | POA: Diagnosis not present

## 2019-04-12 DIAGNOSIS — N189 Chronic kidney disease, unspecified: Secondary | ICD-10-CM | POA: Diagnosis not present

## 2019-04-14 DIAGNOSIS — F39 Unspecified mood [affective] disorder: Secondary | ICD-10-CM | POA: Diagnosis not present

## 2019-04-19 DIAGNOSIS — M5136 Other intervertebral disc degeneration, lumbar region: Secondary | ICD-10-CM | POA: Diagnosis not present

## 2019-04-19 DIAGNOSIS — M9901 Segmental and somatic dysfunction of cervical region: Secondary | ICD-10-CM | POA: Diagnosis not present

## 2019-04-19 DIAGNOSIS — M9903 Segmental and somatic dysfunction of lumbar region: Secondary | ICD-10-CM | POA: Diagnosis not present

## 2019-04-19 DIAGNOSIS — M9904 Segmental and somatic dysfunction of sacral region: Secondary | ICD-10-CM | POA: Diagnosis not present

## 2019-04-19 DIAGNOSIS — M9902 Segmental and somatic dysfunction of thoracic region: Secondary | ICD-10-CM | POA: Diagnosis not present

## 2019-04-19 DIAGNOSIS — M5030 Other cervical disc degeneration, unspecified cervical region: Secondary | ICD-10-CM | POA: Diagnosis not present

## 2019-04-21 DIAGNOSIS — M9903 Segmental and somatic dysfunction of lumbar region: Secondary | ICD-10-CM | POA: Diagnosis not present

## 2019-04-21 DIAGNOSIS — M9904 Segmental and somatic dysfunction of sacral region: Secondary | ICD-10-CM | POA: Diagnosis not present

## 2019-04-21 DIAGNOSIS — M5136 Other intervertebral disc degeneration, lumbar region: Secondary | ICD-10-CM | POA: Diagnosis not present

## 2019-04-21 DIAGNOSIS — M9902 Segmental and somatic dysfunction of thoracic region: Secondary | ICD-10-CM | POA: Diagnosis not present

## 2019-04-21 DIAGNOSIS — M5030 Other cervical disc degeneration, unspecified cervical region: Secondary | ICD-10-CM | POA: Diagnosis not present

## 2019-04-21 DIAGNOSIS — M9901 Segmental and somatic dysfunction of cervical region: Secondary | ICD-10-CM | POA: Diagnosis not present

## 2019-04-26 DIAGNOSIS — M9903 Segmental and somatic dysfunction of lumbar region: Secondary | ICD-10-CM | POA: Diagnosis not present

## 2019-04-26 DIAGNOSIS — M9901 Segmental and somatic dysfunction of cervical region: Secondary | ICD-10-CM | POA: Diagnosis not present

## 2019-04-26 DIAGNOSIS — M5030 Other cervical disc degeneration, unspecified cervical region: Secondary | ICD-10-CM | POA: Diagnosis not present

## 2019-04-26 DIAGNOSIS — M9904 Segmental and somatic dysfunction of sacral region: Secondary | ICD-10-CM | POA: Diagnosis not present

## 2019-04-26 DIAGNOSIS — M9902 Segmental and somatic dysfunction of thoracic region: Secondary | ICD-10-CM | POA: Diagnosis not present

## 2019-04-26 DIAGNOSIS — M5136 Other intervertebral disc degeneration, lumbar region: Secondary | ICD-10-CM | POA: Diagnosis not present

## 2019-04-28 DIAGNOSIS — M5136 Other intervertebral disc degeneration, lumbar region: Secondary | ICD-10-CM | POA: Diagnosis not present

## 2019-04-28 DIAGNOSIS — M5030 Other cervical disc degeneration, unspecified cervical region: Secondary | ICD-10-CM | POA: Diagnosis not present

## 2019-04-28 DIAGNOSIS — M9902 Segmental and somatic dysfunction of thoracic region: Secondary | ICD-10-CM | POA: Diagnosis not present

## 2019-04-28 DIAGNOSIS — M9903 Segmental and somatic dysfunction of lumbar region: Secondary | ICD-10-CM | POA: Diagnosis not present

## 2019-04-28 DIAGNOSIS — M9901 Segmental and somatic dysfunction of cervical region: Secondary | ICD-10-CM | POA: Diagnosis not present

## 2019-04-28 DIAGNOSIS — M9904 Segmental and somatic dysfunction of sacral region: Secondary | ICD-10-CM | POA: Diagnosis not present

## 2019-05-05 DIAGNOSIS — R809 Proteinuria, unspecified: Secondary | ICD-10-CM | POA: Diagnosis not present

## 2019-05-08 DIAGNOSIS — M9904 Segmental and somatic dysfunction of sacral region: Secondary | ICD-10-CM | POA: Diagnosis not present

## 2019-05-08 DIAGNOSIS — M5030 Other cervical disc degeneration, unspecified cervical region: Secondary | ICD-10-CM | POA: Diagnosis not present

## 2019-05-08 DIAGNOSIS — M9901 Segmental and somatic dysfunction of cervical region: Secondary | ICD-10-CM | POA: Diagnosis not present

## 2019-05-08 DIAGNOSIS — M5136 Other intervertebral disc degeneration, lumbar region: Secondary | ICD-10-CM | POA: Diagnosis not present

## 2019-05-08 DIAGNOSIS — M9902 Segmental and somatic dysfunction of thoracic region: Secondary | ICD-10-CM | POA: Diagnosis not present

## 2019-05-08 DIAGNOSIS — M9903 Segmental and somatic dysfunction of lumbar region: Secondary | ICD-10-CM | POA: Diagnosis not present

## 2019-05-10 DIAGNOSIS — M9903 Segmental and somatic dysfunction of lumbar region: Secondary | ICD-10-CM | POA: Diagnosis not present

## 2019-05-10 DIAGNOSIS — M9901 Segmental and somatic dysfunction of cervical region: Secondary | ICD-10-CM | POA: Diagnosis not present

## 2019-05-10 DIAGNOSIS — M5136 Other intervertebral disc degeneration, lumbar region: Secondary | ICD-10-CM | POA: Diagnosis not present

## 2019-05-10 DIAGNOSIS — M5030 Other cervical disc degeneration, unspecified cervical region: Secondary | ICD-10-CM | POA: Diagnosis not present

## 2019-05-10 DIAGNOSIS — M9902 Segmental and somatic dysfunction of thoracic region: Secondary | ICD-10-CM | POA: Diagnosis not present

## 2019-05-10 DIAGNOSIS — M9904 Segmental and somatic dysfunction of sacral region: Secondary | ICD-10-CM | POA: Diagnosis not present

## 2019-05-12 DIAGNOSIS — M5136 Other intervertebral disc degeneration, lumbar region: Secondary | ICD-10-CM | POA: Diagnosis not present

## 2019-05-12 DIAGNOSIS — M5030 Other cervical disc degeneration, unspecified cervical region: Secondary | ICD-10-CM | POA: Diagnosis not present

## 2019-05-12 DIAGNOSIS — M9902 Segmental and somatic dysfunction of thoracic region: Secondary | ICD-10-CM | POA: Diagnosis not present

## 2019-05-12 DIAGNOSIS — M9901 Segmental and somatic dysfunction of cervical region: Secondary | ICD-10-CM | POA: Diagnosis not present

## 2019-05-12 DIAGNOSIS — M9904 Segmental and somatic dysfunction of sacral region: Secondary | ICD-10-CM | POA: Diagnosis not present

## 2019-05-12 DIAGNOSIS — M9903 Segmental and somatic dysfunction of lumbar region: Secondary | ICD-10-CM | POA: Diagnosis not present

## 2019-05-15 DIAGNOSIS — M9903 Segmental and somatic dysfunction of lumbar region: Secondary | ICD-10-CM | POA: Diagnosis not present

## 2019-05-15 DIAGNOSIS — M9904 Segmental and somatic dysfunction of sacral region: Secondary | ICD-10-CM | POA: Diagnosis not present

## 2019-05-15 DIAGNOSIS — M9902 Segmental and somatic dysfunction of thoracic region: Secondary | ICD-10-CM | POA: Diagnosis not present

## 2019-05-15 DIAGNOSIS — M5030 Other cervical disc degeneration, unspecified cervical region: Secondary | ICD-10-CM | POA: Diagnosis not present

## 2019-05-15 DIAGNOSIS — M5136 Other intervertebral disc degeneration, lumbar region: Secondary | ICD-10-CM | POA: Diagnosis not present

## 2019-05-15 DIAGNOSIS — M9901 Segmental and somatic dysfunction of cervical region: Secondary | ICD-10-CM | POA: Diagnosis not present

## 2019-05-16 DIAGNOSIS — I10 Essential (primary) hypertension: Secondary | ICD-10-CM | POA: Diagnosis not present

## 2019-05-17 DIAGNOSIS — M9904 Segmental and somatic dysfunction of sacral region: Secondary | ICD-10-CM | POA: Diagnosis not present

## 2019-05-17 DIAGNOSIS — N184 Chronic kidney disease, stage 4 (severe): Secondary | ICD-10-CM | POA: Diagnosis not present

## 2019-05-17 DIAGNOSIS — Z1331 Encounter for screening for depression: Secondary | ICD-10-CM | POA: Diagnosis not present

## 2019-05-17 DIAGNOSIS — M9903 Segmental and somatic dysfunction of lumbar region: Secondary | ICD-10-CM | POA: Diagnosis not present

## 2019-05-17 DIAGNOSIS — M9902 Segmental and somatic dysfunction of thoracic region: Secondary | ICD-10-CM | POA: Diagnosis not present

## 2019-05-17 DIAGNOSIS — M5136 Other intervertebral disc degeneration, lumbar region: Secondary | ICD-10-CM | POA: Diagnosis not present

## 2019-05-17 DIAGNOSIS — I129 Hypertensive chronic kidney disease with stage 1 through stage 4 chronic kidney disease, or unspecified chronic kidney disease: Secondary | ICD-10-CM | POA: Diagnosis not present

## 2019-05-17 DIAGNOSIS — M5030 Other cervical disc degeneration, unspecified cervical region: Secondary | ICD-10-CM | POA: Diagnosis not present

## 2019-05-17 DIAGNOSIS — E1129 Type 2 diabetes mellitus with other diabetic kidney complication: Secondary | ICD-10-CM | POA: Diagnosis not present

## 2019-05-17 DIAGNOSIS — F39 Unspecified mood [affective] disorder: Secondary | ICD-10-CM | POA: Diagnosis not present

## 2019-05-17 DIAGNOSIS — M9901 Segmental and somatic dysfunction of cervical region: Secondary | ICD-10-CM | POA: Diagnosis not present

## 2019-05-17 DIAGNOSIS — E669 Obesity, unspecified: Secondary | ICD-10-CM | POA: Diagnosis not present

## 2019-05-17 DIAGNOSIS — M25561 Pain in right knee: Secondary | ICD-10-CM | POA: Diagnosis not present

## 2019-05-17 DIAGNOSIS — E785 Hyperlipidemia, unspecified: Secondary | ICD-10-CM | POA: Diagnosis not present

## 2019-05-19 DIAGNOSIS — M9902 Segmental and somatic dysfunction of thoracic region: Secondary | ICD-10-CM | POA: Diagnosis not present

## 2019-05-19 DIAGNOSIS — M9901 Segmental and somatic dysfunction of cervical region: Secondary | ICD-10-CM | POA: Diagnosis not present

## 2019-05-19 DIAGNOSIS — M5136 Other intervertebral disc degeneration, lumbar region: Secondary | ICD-10-CM | POA: Diagnosis not present

## 2019-05-19 DIAGNOSIS — M9903 Segmental and somatic dysfunction of lumbar region: Secondary | ICD-10-CM | POA: Diagnosis not present

## 2019-05-19 DIAGNOSIS — M5030 Other cervical disc degeneration, unspecified cervical region: Secondary | ICD-10-CM | POA: Diagnosis not present

## 2019-05-19 DIAGNOSIS — M9904 Segmental and somatic dysfunction of sacral region: Secondary | ICD-10-CM | POA: Diagnosis not present

## 2019-05-22 DIAGNOSIS — M9901 Segmental and somatic dysfunction of cervical region: Secondary | ICD-10-CM | POA: Diagnosis not present

## 2019-05-22 DIAGNOSIS — M9903 Segmental and somatic dysfunction of lumbar region: Secondary | ICD-10-CM | POA: Diagnosis not present

## 2019-05-22 DIAGNOSIS — M9904 Segmental and somatic dysfunction of sacral region: Secondary | ICD-10-CM | POA: Diagnosis not present

## 2019-05-22 DIAGNOSIS — M5030 Other cervical disc degeneration, unspecified cervical region: Secondary | ICD-10-CM | POA: Diagnosis not present

## 2019-05-22 DIAGNOSIS — M5136 Other intervertebral disc degeneration, lumbar region: Secondary | ICD-10-CM | POA: Diagnosis not present

## 2019-05-22 DIAGNOSIS — M9902 Segmental and somatic dysfunction of thoracic region: Secondary | ICD-10-CM | POA: Diagnosis not present

## 2019-05-24 DIAGNOSIS — M9902 Segmental and somatic dysfunction of thoracic region: Secondary | ICD-10-CM | POA: Diagnosis not present

## 2019-05-24 DIAGNOSIS — M5136 Other intervertebral disc degeneration, lumbar region: Secondary | ICD-10-CM | POA: Diagnosis not present

## 2019-05-24 DIAGNOSIS — M9903 Segmental and somatic dysfunction of lumbar region: Secondary | ICD-10-CM | POA: Diagnosis not present

## 2019-05-24 DIAGNOSIS — M9904 Segmental and somatic dysfunction of sacral region: Secondary | ICD-10-CM | POA: Diagnosis not present

## 2019-05-24 DIAGNOSIS — M9901 Segmental and somatic dysfunction of cervical region: Secondary | ICD-10-CM | POA: Diagnosis not present

## 2019-05-24 DIAGNOSIS — M5030 Other cervical disc degeneration, unspecified cervical region: Secondary | ICD-10-CM | POA: Diagnosis not present

## 2019-05-24 DIAGNOSIS — F39 Unspecified mood [affective] disorder: Secondary | ICD-10-CM | POA: Diagnosis not present

## 2019-05-25 ENCOUNTER — Other Ambulatory Visit: Payer: Self-pay

## 2019-05-25 ENCOUNTER — Ambulatory Visit (INDEPENDENT_AMBULATORY_CARE_PROVIDER_SITE_OTHER): Payer: PPO | Admitting: Ophthalmology

## 2019-05-25 ENCOUNTER — Encounter (INDEPENDENT_AMBULATORY_CARE_PROVIDER_SITE_OTHER): Payer: Self-pay | Admitting: Ophthalmology

## 2019-05-25 DIAGNOSIS — E113552 Type 2 diabetes mellitus with stable proliferative diabetic retinopathy, left eye: Secondary | ICD-10-CM | POA: Insufficient documentation

## 2019-05-25 DIAGNOSIS — E113512 Type 2 diabetes mellitus with proliferative diabetic retinopathy with macular edema, left eye: Secondary | ICD-10-CM

## 2019-05-25 DIAGNOSIS — E113591 Type 2 diabetes mellitus with proliferative diabetic retinopathy without macular edema, right eye: Secondary | ICD-10-CM

## 2019-05-25 DIAGNOSIS — H4312 Vitreous hemorrhage, left eye: Secondary | ICD-10-CM | POA: Insufficient documentation

## 2019-05-25 HISTORY — DX: Vitreous hemorrhage, left eye: H43.12

## 2019-05-25 NOTE — Progress Notes (Signed)
05/25/2019     CHIEF COMPLAINT Patient presents for Spots and/or Floaters   HISTORY OF PRESENT ILLNESS: Rhonda Clayton is a 74 y.o. female who presents to the clinic today for:   HPI    Spots and/or Floaters    In left eye.  Characterized as spots.  Duration of 8 hours.  Since onset it is gradually improving.  Associated symptoms include Negative for flashes.  Context: diabetes.  I, the attending physician,  performed the HPI with the patient and updated documentation appropriately.          Comments    Pt states she woke up this morning to a "rainfall of spots" in OS. Pt states she also saw a dark/black spot in OS vision. Pt states both the floaters and dark spot have decreased slightly since this morning.  BGL: 105 this AM A1C: 6.5       Last edited by Tilda Franco on 05/25/2019  1:36 PM. (History)      Referring physician: Prince Solian, MD [place],  [place] [address]  HISTORICAL INFORMATION:   Selected notes from the [facility]    Lab Results  Component Value Date   HGBA1C 8.9 (H) 05/09/2014     CURRENT MEDICATIONS: No current outpatient medications on file. (Ophthalmic Drugs)   No current facility-administered medications for this visit. (Ophthalmic Drugs)   Current Outpatient Medications (Other)  Medication Sig  . amLODipine (NORVASC) 2.5 MG tablet 2 tablets.  Marland Kitchen aspirin EC 81 MG tablet Take 81 mg by mouth daily.  . betamethasone dipropionate (DIPROLENE) 0.05 % cream Apply topically 2 (two) times daily.  . chlorthalidone (HYGROTON) 25 MG tablet Take 25 mg by mouth daily.  . clobetasol ointment (TEMOVATE) 2.42 % Apply 1 application topically at bedtime.  . Insulin Aspart (NOVOLOG [place]) Inject 22 Units into the skin 2 (two) times daily.  Marland Kitchen levothyroxine (SYNTHROID, LEVOTHROID) 112 MCG tablet Take 100 mcg by mouth daily.   Marland Kitchen telmisartan-hydrochlorothiazide (MICARDIS HCT) 80-25 MG tablet Take 1 tablet by mouth daily.   No current  facility-administered medications for this visit. (Other)      REVIEW OF SYSTEMS: ROS    Positive for: Endocrine   Last edited by Tilda Franco on 05/25/2019  1:36 PM. (History)       ALLERGIES Allergies  Allergen Reactions  . Coffee Flavor Other (See Comments)    Sick on stomach, sneezing, backed up  . Cephalexin Other (See Comments)    Yeast infection  . Ciprofloxacin Other (See Comments)    tendinitis  . Oatmeal Other (See Comments)    Sneezing and drainage  . Sulfa Antibiotics Other (See Comments)    Yeast infection    PAST MEDICAL HISTORY Past Medical History:  Diagnosis Date  . Anemia   . Cystocele   . DJD (degenerative joint disease)   . Esophageal stricture   . Essential hypertension   . Hyperlipidemia   . Hypothyroidism   . Lichen sclerosus   . Osteopenia   . RBBB with left anterior fascicular block   . Rectocele   . Stress incontinence   . Torn rotator cuff    Bilateral  . Type 2 diabetes mellitus ([place])   . Venous insufficiency    Past Surgical History:  Procedure Laterality Date  . ABDOMINAL EXPOSURE N/A 05/09/2014   Procedure: ABDOMINAL EXPOSURE;  Surgeon: Rosetta Posner, MD;  Location: [facility];  Service: Vascular;  Laterality: N/A;  .  ANTERIOR LAT LUMBAR FUSION Right 05/09/2014   Procedure: Lumbar three-four, Lumbar four-five Anterior lateral lumbar fusion;  Surgeon: Erline Levine, MD;  Location: [facility];  Service: Neurosurgery;  Laterality: Right;  . ANTERIOR LUMBAR FUSION N/A 05/09/2014   Procedure: Stage 1:Lumbar four-five, Lumbar five-Sacral one Anterior lumbar interbody fusion with Dr. Donnetta Hutching for approach ;  Surgeon: Erline Levine, MD;  Location: [facility] NEUR[facility] ORS;  Service: Neurosurgery;  Laterality: N/A;  . CHOLECYSTECTOMY  1987  . INCONTINENCE SURGERY    . LUMBAR PERCUTANEOUS PEDICLE SCREW 3 LEVEL N/A 05/10/2014   Procedure: Stage 2: Percutaneous Pedicle Screws; Laminectomy L3-4  ;  Surgeon: Erline Levine, MD;  Location: [facility];   Service: Neurosurgery;  Laterality: N/A;  Stage 2: Percutaneous Pedicle Screws T10 to Ileum; Laminectomy L3-4    . RECTAL SURGERY    . VAGINA SURGERY  2018   Josph Macho Touch- Vaginal Dryness    FAMILY HISTORY Family History  Problem Relation Age of Onset  . Multiple myeloma Mother   . Cancer Maternal Aunt        Cancer in the mouth - dipped snuff  . Heart disease Brother   . Diabetes Father   . Colon polyps Brother   . Heart disease Maternal Uncle   . Heart disease Paternal Uncle   . Heart disease Paternal Grandfather     SOCIAL HISTORY Social History   Tobacco Use  . Smoking status: Never Smoker  . Smokeless tobacco: Never Used  Substance Use Topics  . Alcohol use: No  . Drug use: Yes    Types: Hydromorphone         OPHTHALMIC EXAM: Base Eye Exam    Visual Acuity (Snellen - Linear)      Right Left   Dist [place] 20/20 -1 20/25 -2       Tonometry (Tonopen, 1:44 PM)      Right Left   Pressure 16 17       Pupils      Pupils Dark Light Shape React APD   Right PERRL 3.5 3 Round Brisk None   Left PERRL 3.5 3 Round Brisk None       Visual Fields      Left Right    Full Full       Neuro/Psych    Oriented x3: Yes   Mood/Affect: Normal       Dilation    Left eye: 1.0% Mydriacyl, 2.5% Phenylephrine @ 1:44 PM          IMAGING AND PROCEDURES  Imaging and Procedures for 05/25/19           ASSESSMENT/PLAN:  No problem-specific Assessment & Plan notes found for this encounter.      ICD-10-CM   1. 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 Proliferative diabetic retinopathy of right eye associated with type 2 diabetes mellitus, macular edema presence unspecified (HCC)  E11.3591 Color Fundus Photography Optos - OU - Both Eyes    1.Very severe nonproliferative diabetic retinopathy, now stable status post mid peripheral  panretinal photocoagulation.  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  We will monitor patient at home as her vision  has already started to improve from her early symptoms today.  3.  Ophthalmic Meds Ordered this visit:  No orders of the defined types were placed in this encounter.      No follow-ups on file.  There are no Patient Instructions on file for this visit.   Explained the diagnoses, plan, and follow up with the patient and they expressed understanding.  Patient expressed understanding of the importance of proper follow up care.   Clent Demark Grete Bosko M.D. Diseases & Surgery of the Retina and Vitreous [facility] 05/25/19     Abbreviations: M myopia (nearsighted); A astigmatism; H hyperopia (farsighted); P presbyopia; Mrx spectacle prescription;  CTL contact lenses; OD right eye; OS left eye; OU both eyes  XT exotropia; ET esotropia; PEK punctate epithelial keratitis; PEE punctate epithelial erosions; DES dry eye syndrome; MGD meibomian gland dysfunction; ATs artificial tears; PFAT's preservative free artificial tears; [place] nuclear sclerotic cataract; PSC posterior subcapsular cataract; ERM epi-retinal membrane; PVD posterior vitreous detachment; RD retinal detachment; DM diabetes mellitus; DR diabetic retinopathy; NPDR non-proliferative diabetic retinopathy; PDR proliferative diabetic retinopathy; CSME clinically significant macular edema; DME diabetic macular edema; dbh dot blot hemorrhages; CWS cotton wool spot; POAG primary open angle glaucoma; C/D cup-to-disc ratio; HVF humphrey visual field; GVF goldmann visual field; OCT optical coherence tomography; IOP intraocular pressure; BRVO Branch retinal vein occlusion; CRVO central retinal vein occlusion; CRAO  central retinal artery occlusion; BRAO branch retinal artery occlusion; RT retinal tear; SB scleral buckle; PPV pars plana vitrectomy; VH Vitreous hemorrhage; PRP panretinal laser photocoagulation; IVK intravitreal kenalog; VMT vitreomacular traction; MH Macular hole;  NVD neovascularization of the disc; NVE neovascularization elsewhere; AREDS age related eye disease study; ARMD age related macular degeneration; POAG primary open angle glaucoma; EBMD epithelial/anterior basement membrane dystrophy; ACIOL anterior chamber intraocular lens; IOL intraocular lens; PCIOL posterior chamber intraocular lens; Phaco/IOL phacoemulsification with intraocular lens placement; [place] photorefractive keratectomy; LASIK laser assisted in situ keratomileusis; HTN hypertension; DM diabetes mellitus; COPD chronic obstructive pulmonary disease

## 2019-05-25 NOTE — Assessment & Plan Note (Signed)
The nature of regressed proliferative diabetic retinopathy was discussed with the patient. The patient was advised to maintain good glucose, blood pressure, monitor kidney function and serum lipid control as advised by personal physician. Rare risk for reactivation of progression exist with untreated severe anemia, untreated renal failure, untreated heart failure, and smoking. Complete avoidance of smoking was recommended. The chance of recurrent proliferative diabetic retinopathy was discussed as well as the chance of vitreous hemorrhage for which further treatments may be necessary. 

## 2019-05-26 DIAGNOSIS — M9902 Segmental and somatic dysfunction of thoracic region: Secondary | ICD-10-CM | POA: Diagnosis not present

## 2019-05-26 DIAGNOSIS — M9901 Segmental and somatic dysfunction of cervical region: Secondary | ICD-10-CM | POA: Diagnosis not present

## 2019-05-26 DIAGNOSIS — M9904 Segmental and somatic dysfunction of sacral region: Secondary | ICD-10-CM | POA: Diagnosis not present

## 2019-05-26 DIAGNOSIS — M9903 Segmental and somatic dysfunction of lumbar region: Secondary | ICD-10-CM | POA: Diagnosis not present

## 2019-05-26 DIAGNOSIS — M5030 Other cervical disc degeneration, unspecified cervical region: Secondary | ICD-10-CM | POA: Diagnosis not present

## 2019-05-26 DIAGNOSIS — M5136 Other intervertebral disc degeneration, lumbar region: Secondary | ICD-10-CM | POA: Diagnosis not present

## 2019-05-29 DIAGNOSIS — M9904 Segmental and somatic dysfunction of sacral region: Secondary | ICD-10-CM | POA: Diagnosis not present

## 2019-05-29 DIAGNOSIS — M5136 Other intervertebral disc degeneration, lumbar region: Secondary | ICD-10-CM | POA: Diagnosis not present

## 2019-05-29 DIAGNOSIS — M9902 Segmental and somatic dysfunction of thoracic region: Secondary | ICD-10-CM | POA: Diagnosis not present

## 2019-05-29 DIAGNOSIS — M5030 Other cervical disc degeneration, unspecified cervical region: Secondary | ICD-10-CM | POA: Diagnosis not present

## 2019-05-29 DIAGNOSIS — M9903 Segmental and somatic dysfunction of lumbar region: Secondary | ICD-10-CM | POA: Diagnosis not present

## 2019-05-29 DIAGNOSIS — M9901 Segmental and somatic dysfunction of cervical region: Secondary | ICD-10-CM | POA: Diagnosis not present

## 2019-05-30 DIAGNOSIS — M179 Osteoarthritis of knee, unspecified: Secondary | ICD-10-CM | POA: Diagnosis not present

## 2019-05-30 DIAGNOSIS — M25561 Pain in right knee: Secondary | ICD-10-CM | POA: Diagnosis not present

## 2019-05-30 DIAGNOSIS — E109 Type 1 diabetes mellitus without complications: Secondary | ICD-10-CM | POA: Diagnosis not present

## 2019-05-31 DIAGNOSIS — M9901 Segmental and somatic dysfunction of cervical region: Secondary | ICD-10-CM | POA: Diagnosis not present

## 2019-05-31 DIAGNOSIS — M9902 Segmental and somatic dysfunction of thoracic region: Secondary | ICD-10-CM | POA: Diagnosis not present

## 2019-05-31 DIAGNOSIS — M9904 Segmental and somatic dysfunction of sacral region: Secondary | ICD-10-CM | POA: Diagnosis not present

## 2019-05-31 DIAGNOSIS — M5030 Other cervical disc degeneration, unspecified cervical region: Secondary | ICD-10-CM | POA: Diagnosis not present

## 2019-05-31 DIAGNOSIS — M5136 Other intervertebral disc degeneration, lumbar region: Secondary | ICD-10-CM | POA: Diagnosis not present

## 2019-05-31 DIAGNOSIS — M9903 Segmental and somatic dysfunction of lumbar region: Secondary | ICD-10-CM | POA: Diagnosis not present

## 2019-06-02 DIAGNOSIS — M9903 Segmental and somatic dysfunction of lumbar region: Secondary | ICD-10-CM | POA: Diagnosis not present

## 2019-06-02 DIAGNOSIS — M9901 Segmental and somatic dysfunction of cervical region: Secondary | ICD-10-CM | POA: Diagnosis not present

## 2019-06-02 DIAGNOSIS — M5030 Other cervical disc degeneration, unspecified cervical region: Secondary | ICD-10-CM | POA: Diagnosis not present

## 2019-06-02 DIAGNOSIS — M9902 Segmental and somatic dysfunction of thoracic region: Secondary | ICD-10-CM | POA: Diagnosis not present

## 2019-06-02 DIAGNOSIS — M9904 Segmental and somatic dysfunction of sacral region: Secondary | ICD-10-CM | POA: Diagnosis not present

## 2019-06-02 DIAGNOSIS — M5136 Other intervertebral disc degeneration, lumbar region: Secondary | ICD-10-CM | POA: Diagnosis not present

## 2019-06-05 DIAGNOSIS — M9903 Segmental and somatic dysfunction of lumbar region: Secondary | ICD-10-CM | POA: Diagnosis not present

## 2019-06-05 DIAGNOSIS — M9901 Segmental and somatic dysfunction of cervical region: Secondary | ICD-10-CM | POA: Diagnosis not present

## 2019-06-05 DIAGNOSIS — M5136 Other intervertebral disc degeneration, lumbar region: Secondary | ICD-10-CM | POA: Diagnosis not present

## 2019-06-05 DIAGNOSIS — M9904 Segmental and somatic dysfunction of sacral region: Secondary | ICD-10-CM | POA: Diagnosis not present

## 2019-06-05 DIAGNOSIS — M5030 Other cervical disc degeneration, unspecified cervical region: Secondary | ICD-10-CM | POA: Diagnosis not present

## 2019-06-05 DIAGNOSIS — M9902 Segmental and somatic dysfunction of thoracic region: Secondary | ICD-10-CM | POA: Diagnosis not present

## 2019-06-09 DIAGNOSIS — M9903 Segmental and somatic dysfunction of lumbar region: Secondary | ICD-10-CM | POA: Diagnosis not present

## 2019-06-09 DIAGNOSIS — M5136 Other intervertebral disc degeneration, lumbar region: Secondary | ICD-10-CM | POA: Diagnosis not present

## 2019-06-09 DIAGNOSIS — M9904 Segmental and somatic dysfunction of sacral region: Secondary | ICD-10-CM | POA: Diagnosis not present

## 2019-06-09 DIAGNOSIS — M9901 Segmental and somatic dysfunction of cervical region: Secondary | ICD-10-CM | POA: Diagnosis not present

## 2019-06-09 DIAGNOSIS — M9902 Segmental and somatic dysfunction of thoracic region: Secondary | ICD-10-CM | POA: Diagnosis not present

## 2019-06-09 DIAGNOSIS — M5030 Other cervical disc degeneration, unspecified cervical region: Secondary | ICD-10-CM | POA: Diagnosis not present

## 2019-06-12 ENCOUNTER — Encounter (INDEPENDENT_AMBULATORY_CARE_PROVIDER_SITE_OTHER): Payer: PPO | Admitting: Ophthalmology

## 2019-06-12 DIAGNOSIS — M5030 Other cervical disc degeneration, unspecified cervical region: Secondary | ICD-10-CM | POA: Diagnosis not present

## 2019-06-12 DIAGNOSIS — M9904 Segmental and somatic dysfunction of sacral region: Secondary | ICD-10-CM | POA: Diagnosis not present

## 2019-06-12 DIAGNOSIS — M9901 Segmental and somatic dysfunction of cervical region: Secondary | ICD-10-CM | POA: Diagnosis not present

## 2019-06-12 DIAGNOSIS — M9903 Segmental and somatic dysfunction of lumbar region: Secondary | ICD-10-CM | POA: Diagnosis not present

## 2019-06-12 DIAGNOSIS — M9902 Segmental and somatic dysfunction of thoracic region: Secondary | ICD-10-CM | POA: Diagnosis not present

## 2019-06-12 DIAGNOSIS — M5136 Other intervertebral disc degeneration, lumbar region: Secondary | ICD-10-CM | POA: Diagnosis not present

## 2019-06-14 DIAGNOSIS — M5136 Other intervertebral disc degeneration, lumbar region: Secondary | ICD-10-CM | POA: Diagnosis not present

## 2019-06-14 DIAGNOSIS — M9903 Segmental and somatic dysfunction of lumbar region: Secondary | ICD-10-CM | POA: Diagnosis not present

## 2019-06-14 DIAGNOSIS — M9902 Segmental and somatic dysfunction of thoracic region: Secondary | ICD-10-CM | POA: Diagnosis not present

## 2019-06-14 DIAGNOSIS — M9904 Segmental and somatic dysfunction of sacral region: Secondary | ICD-10-CM | POA: Diagnosis not present

## 2019-06-14 DIAGNOSIS — M5030 Other cervical disc degeneration, unspecified cervical region: Secondary | ICD-10-CM | POA: Diagnosis not present

## 2019-06-14 DIAGNOSIS — M9901 Segmental and somatic dysfunction of cervical region: Secondary | ICD-10-CM | POA: Diagnosis not present

## 2019-06-21 DIAGNOSIS — M5136 Other intervertebral disc degeneration, lumbar region: Secondary | ICD-10-CM | POA: Diagnosis not present

## 2019-06-21 DIAGNOSIS — M9904 Segmental and somatic dysfunction of sacral region: Secondary | ICD-10-CM | POA: Diagnosis not present

## 2019-06-21 DIAGNOSIS — M5030 Other cervical disc degeneration, unspecified cervical region: Secondary | ICD-10-CM | POA: Diagnosis not present

## 2019-06-21 DIAGNOSIS — M9902 Segmental and somatic dysfunction of thoracic region: Secondary | ICD-10-CM | POA: Diagnosis not present

## 2019-06-21 DIAGNOSIS — M9903 Segmental and somatic dysfunction of lumbar region: Secondary | ICD-10-CM | POA: Diagnosis not present

## 2019-06-21 DIAGNOSIS — M9901 Segmental and somatic dysfunction of cervical region: Secondary | ICD-10-CM | POA: Diagnosis not present

## 2019-06-26 DIAGNOSIS — M5136 Other intervertebral disc degeneration, lumbar region: Secondary | ICD-10-CM | POA: Diagnosis not present

## 2019-06-26 DIAGNOSIS — M9902 Segmental and somatic dysfunction of thoracic region: Secondary | ICD-10-CM | POA: Diagnosis not present

## 2019-06-26 DIAGNOSIS — M5030 Other cervical disc degeneration, unspecified cervical region: Secondary | ICD-10-CM | POA: Diagnosis not present

## 2019-06-26 DIAGNOSIS — M9901 Segmental and somatic dysfunction of cervical region: Secondary | ICD-10-CM | POA: Diagnosis not present

## 2019-06-26 DIAGNOSIS — M9904 Segmental and somatic dysfunction of sacral region: Secondary | ICD-10-CM | POA: Diagnosis not present

## 2019-06-26 DIAGNOSIS — M9903 Segmental and somatic dysfunction of lumbar region: Secondary | ICD-10-CM | POA: Diagnosis not present

## 2019-07-05 DIAGNOSIS — M9904 Segmental and somatic dysfunction of sacral region: Secondary | ICD-10-CM | POA: Diagnosis not present

## 2019-07-05 DIAGNOSIS — M9902 Segmental and somatic dysfunction of thoracic region: Secondary | ICD-10-CM | POA: Diagnosis not present

## 2019-07-05 DIAGNOSIS — M5136 Other intervertebral disc degeneration, lumbar region: Secondary | ICD-10-CM | POA: Diagnosis not present

## 2019-07-05 DIAGNOSIS — M5030 Other cervical disc degeneration, unspecified cervical region: Secondary | ICD-10-CM | POA: Diagnosis not present

## 2019-07-05 DIAGNOSIS — M9901 Segmental and somatic dysfunction of cervical region: Secondary | ICD-10-CM | POA: Diagnosis not present

## 2019-07-05 DIAGNOSIS — M9903 Segmental and somatic dysfunction of lumbar region: Secondary | ICD-10-CM | POA: Diagnosis not present

## 2019-07-06 ENCOUNTER — Encounter (INDEPENDENT_AMBULATORY_CARE_PROVIDER_SITE_OTHER): Payer: PPO | Admitting: Ophthalmology

## 2019-07-11 ENCOUNTER — Ambulatory Visit (INDEPENDENT_AMBULATORY_CARE_PROVIDER_SITE_OTHER): Payer: PPO | Admitting: Ophthalmology

## 2019-07-11 ENCOUNTER — Other Ambulatory Visit: Payer: Self-pay

## 2019-07-11 DIAGNOSIS — H35371 Puckering of macula, right eye: Secondary | ICD-10-CM | POA: Diagnosis not present

## 2019-07-11 DIAGNOSIS — E113512 Type 2 diabetes mellitus with proliferative diabetic retinopathy with macular edema, left eye: Secondary | ICD-10-CM | POA: Diagnosis not present

## 2019-07-11 DIAGNOSIS — E113591 Type 2 diabetes mellitus with proliferative diabetic retinopathy without macular edema, right eye: Secondary | ICD-10-CM | POA: Diagnosis not present

## 2019-07-11 NOTE — Assessment & Plan Note (Signed)
Resolved OS, no active macular edema.

## 2019-07-11 NOTE — Progress Notes (Signed)
07/11/2019     CHIEF COMPLAINT Patient presents for Retina Follow Up   HISTORY OF PRESENT ILLNESS: Rhonda Clayton is a 74 y.o. female who presents to the clinic today for:   HPI    Retina Follow Up    Patient presents with  Diabetic Retinopathy.  In both eyes.  Duration of 6 weeks.  Since onset it is stable.          Comments    6 week follow up - OCT OU Patient denies change in vision and overall has no complaints. [id number] // A1C 6.7       Last edited by Gerda Diss on 07/11/2019  2:31 PM. (History)      Referring physician: Prince Solian, MD [place],  [place] [address]  HISTORICAL INFORMATION:   Selected notes from the MEDICAL RECORD NUMBER    Lab Results  Component Value Date   HGBA1C 8.9 (H) 05/09/2014     CURRENT MEDICATIONS: No current outpatient medications on file. (Ophthalmic Drugs)   No current facility-administered medications for this visit. (Ophthalmic Drugs)   Current Outpatient Medications (Other)  Medication Sig  . amLODipine (NORVASC) 2.5 MG tablet 2 tablets.  Marland Kitchen aspirin EC 81 MG tablet Take 81 mg by mouth daily.  . betamethasone dipropionate (DIPROLENE) 0.05 % cream Apply topically 2 (two) times daily.  . chlorthalidone (HYGROTON) 25 MG tablet Take 25 mg by mouth daily.  . clobetasol ointment (TEMOVATE) 9.38 % Apply 1 application topically at bedtime.  . Insulin Aspart (NOVOLOG [place]) Inject 22 Units into the skin 2 (two) times daily.  Marland Kitchen levothyroxine (SYNTHROID, LEVOTHROID) 112 MCG tablet Take 100 mcg by mouth daily.   Marland Kitchen telmisartan-hydrochlorothiazide (MICARDIS HCT) 80-25 MG tablet Take 1 tablet by mouth daily.   No current facility-administered medications for this visit. (Other)      REVIEW OF SYSTEMS:    ALLERGIES Allergies  Allergen Reactions  . Coffee Flavor Other (See Comments)    Sick on stomach, sneezing, backed up  . Cephalexin Other (See Comments)    Yeast infection  . Ciprofloxacin Other (See  Comments)    tendinitis  . Oatmeal Other (See Comments)    Sneezing and drainage  . Sulfa Antibiotics Other (See Comments)    Yeast infection    PAST MEDICAL HISTORY Past Medical History:  Diagnosis Date  . Anemia   . Cystocele   . DJD (degenerative joint disease)   . Esophageal stricture   . Essential hypertension   . Hyperlipidemia   . Hypothyroidism   . Lichen sclerosus   . Osteopenia   . RBBB with left anterior fascicular block   . Rectocele   . Stress incontinence   . Torn rotator cuff    Bilateral  . Type 2 diabetes mellitus ([place])   . Venous insufficiency    Past Surgical History:  Procedure Laterality Date  . ABDOMINAL EXPOSURE N/A 05/09/2014   Procedure: ABDOMINAL EXPOSURE;  Surgeon: Rosetta Posner, MD;  Location: [facility];  Service: Vascular;  Laterality: N/A;  . ANTERIOR LAT LUMBAR FUSION Right 05/09/2014   Procedure: Lumbar three-four, Lumbar four-five Anterior lateral lumbar fusion;  Surgeon: Erline Levine, MD;  Location: [place] [facility];  Service: Neurosurgery;  Laterality: Right;  . ANTERIOR LUMBAR FUSION N/A 05/09/2014   Procedure: Stage 1:Lumbar four-five, Lumbar five-Sacral one Anterior lumbar interbody fusion with Dr. Donnetta Hutching for approach ;  Surgeon: Erline Levine, MD;  Location: [place] NEUR[facility] ORS;  Service: Neurosurgery;  Laterality:  N/A;  . CHOLECYSTECTOMY  1987  . INCONTINENCE SURGERY    . LUMBAR PERCUTANEOUS PEDICLE SCREW 3 LEVEL N/A 05/10/2014   Procedure: Stage 2: Percutaneous Pedicle Screws; Laminectomy L3-4  ;  Surgeon: Erline Levine, MD;  Location: [place] NEURO ORS;  Service: Neurosurgery;  Laterality: N/A;  Stage 2: Percutaneous Pedicle Screws T10 to Ileum; Laminectomy L3-4    . RECTAL SURGERY    . VAGINA SURGERY  2018   Josph Macho Touch- Vaginal Dryness    FAMILY HISTORY Family History  Problem Relation Age of Onset  . Multiple myeloma Mother   . Cancer Maternal Aunt        Cancer in the mouth - dipped snuff  . Heart disease Brother   . Diabetes Father     . Colon polyps Brother   . Heart disease Maternal Uncle   . Heart disease Paternal Uncle   . Heart disease Paternal Grandfather     SOCIAL HISTORY Social History   Tobacco Use  . Smoking status: Never Smoker  . Smokeless tobacco: Never Used  Vaping Use  . Vaping Use: Never used  Substance Use Topics  . Alcohol use: No  . Drug use: Yes    Types: Hydromorphone         OPHTHALMIC EXAM: Base Eye Exam    Visual Acuity (Snellen - Linear)      Right Left   Dist [place] 20/20-2 20/30   Dist ph [place]  20/25-2       Tonometry (Tonopen, 2:34 PM)      Right Left   Pressure 13 14       Pupils      Pupils Dark Light Shape React APD   Right PERRL 4 3 Round Brisk None   Left PERRL 4 3 Round Brisk None       Visual Fields (Counting fingers)      Left Right    Full Full       Extraocular Movement      Right Left    Full Full       Neuro/Psych    Oriented x3: Yes   Mood/Affect: Normal       Dilation    Both eyes: 1.0% Mydriacyl, 2.5% Phenylephrine @ 2:34 PM        Slit Lamp and Fundus Exam    External Exam      Right Left   External Normal Normal       Slit Lamp Exam      Right Left   Lids/Lashes Normal Normal   Conjunctiva/Sclera White and quiet White and quiet   Cornea Clear Clear   Anterior Chamber Deep and quiet Deep and quiet   Iris Round and reactive Round and reactive   Lens Posterior chamber intraocular lens Posterior chamber intraocular lens   Vitreous Normal Normal          IMAGING AND PROCEDURES  Imaging and Procedures for 07/11/19  OCT, Retina - OU - Both Eyes       Right Eye Quality was good. Scan locations included subfoveal.   Left Eye Quality was good. Scan locations included subfoveal.                 ASSESSMENT/PLAN:  No problem-specific Assessment & Plan notes found for this encounter.    No diagnosis found.  1.  2.  3.  Ophthalmic Meds Ordered this visit:  No orders of the defined types were placed in this  encounter.  No follow-ups on file.  There are no Patient Instructions on file for this visit.   Explained the diagnoses, plan, and follow up with the patient and they expressed understanding.  Patient expressed understanding of the importance of proper follow up care.   Clent Demark Nevia Henkin M.D. Diseases & Surgery of the Retina and Vitreous Retina & Diabetic [place] 07/11/19     Abbreviations: M myopia (nearsighted); A astigmatism; H hyperopia (farsighted); P presbyopia; Mrx spectacle prescription;  CTL contact lenses; OD right eye; OS left eye; OU both eyes  XT exotropia; ET esotropia; PEK punctate epithelial keratitis; PEE punctate epithelial erosions; DES dry eye syndrome; MGD meibomian gland dysfunction; ATs artificial tears; PFAT's preservative free artificial tears; [place] nuclear sclerotic cataract; PSC posterior subcapsular cataract; ERM epi-retinal membrane; PVD posterior vitreous detachment; RD retinal detachment; DM diabetes mellitus; DR diabetic retinopathy; NPDR non-proliferative diabetic retinopathy; PDR proliferative diabetic retinopathy; CSME clinically significant macular edema; DME diabetic macular edema; dbh dot blot hemorrhages; CWS cotton wool spot; POAG primary open angle glaucoma; C/D cup-to-disc ratio; HVF humphrey visual field; GVF goldmann visual field; OCT optical coherence tomography; IOP intraocular pressure; BRVO Branch retinal vein occlusion; CRVO central retinal vein occlusion; CRAO central retinal artery occlusion; BRAO branch retinal artery occlusion; RT retinal tear; SB scleral buckle; PPV pars plana vitrectomy; VH Vitreous hemorrhage; PRP panretinal laser photocoagulation; IVK intravitreal kenalog; VMT vitreomacular traction; MH Macular hole;  NVD neovascularization of the disc; NVE neovascularization elsewhere; AREDS age related eye disease study; ARMD age related macular degeneration; POAG primary open angle glaucoma; EBMD epithelial/anterior basement membrane  dystrophy; ACIOL anterior chamber intraocular lens; IOL intraocular lens; PCIOL posterior chamber intraocular lens; Phaco/IOL phacoemulsification with intraocular lens placement; [place] photorefractive keratectomy; LASIK laser assisted in situ keratomileusis; HTN hypertension; DM diabetes mellitus; COPD chronic obstructive pulmonary disease

## 2019-07-11 NOTE — Assessment & Plan Note (Signed)
The nature of macular pucker (epiretinal membrane ERM) was discussed with the patient as well as threshold criteria for vitrectomy surgery. I explained that in rare cases another surgery is needed to actually remove a second wrinkle should it regrow.  Most often, the epiretinal membrane and underlying wrinkled internal limiting membrane are removed with the first surgery, to accomplish the goals.   If the operative eye is Phakic (natural lens still present), cataract surgery is often recommended prior to Vitrectomy. This will enable the retina surgeon to have the best view during surgery and the patient to obtain optimal results in the future. Treatment options were discussed.  Minor OD, will observe with no visual effect at present.

## 2019-07-12 DIAGNOSIS — M9904 Segmental and somatic dysfunction of sacral region: Secondary | ICD-10-CM | POA: Diagnosis not present

## 2019-07-12 DIAGNOSIS — M9903 Segmental and somatic dysfunction of lumbar region: Secondary | ICD-10-CM | POA: Diagnosis not present

## 2019-07-12 DIAGNOSIS — M9901 Segmental and somatic dysfunction of cervical region: Secondary | ICD-10-CM | POA: Diagnosis not present

## 2019-07-12 DIAGNOSIS — M5030 Other cervical disc degeneration, unspecified cervical region: Secondary | ICD-10-CM | POA: Diagnosis not present

## 2019-07-12 DIAGNOSIS — M5136 Other intervertebral disc degeneration, lumbar region: Secondary | ICD-10-CM | POA: Diagnosis not present

## 2019-07-12 DIAGNOSIS — M9902 Segmental and somatic dysfunction of thoracic region: Secondary | ICD-10-CM | POA: Diagnosis not present

## 2019-07-19 DIAGNOSIS — M9903 Segmental and somatic dysfunction of lumbar region: Secondary | ICD-10-CM | POA: Diagnosis not present

## 2019-07-19 DIAGNOSIS — M5136 Other intervertebral disc degeneration, lumbar region: Secondary | ICD-10-CM | POA: Diagnosis not present

## 2019-07-19 DIAGNOSIS — M9901 Segmental and somatic dysfunction of cervical region: Secondary | ICD-10-CM | POA: Diagnosis not present

## 2019-07-19 DIAGNOSIS — M9902 Segmental and somatic dysfunction of thoracic region: Secondary | ICD-10-CM | POA: Diagnosis not present

## 2019-07-19 DIAGNOSIS — M5030 Other cervical disc degeneration, unspecified cervical region: Secondary | ICD-10-CM | POA: Diagnosis not present

## 2019-07-19 DIAGNOSIS — M9904 Segmental and somatic dysfunction of sacral region: Secondary | ICD-10-CM | POA: Diagnosis not present

## 2019-07-28 DIAGNOSIS — M9903 Segmental and somatic dysfunction of lumbar region: Secondary | ICD-10-CM | POA: Diagnosis not present

## 2019-07-28 DIAGNOSIS — M9901 Segmental and somatic dysfunction of cervical region: Secondary | ICD-10-CM | POA: Diagnosis not present

## 2019-07-28 DIAGNOSIS — M9904 Segmental and somatic dysfunction of sacral region: Secondary | ICD-10-CM | POA: Diagnosis not present

## 2019-07-28 DIAGNOSIS — M5136 Other intervertebral disc degeneration, lumbar region: Secondary | ICD-10-CM | POA: Diagnosis not present

## 2019-07-28 DIAGNOSIS — M5030 Other cervical disc degeneration, unspecified cervical region: Secondary | ICD-10-CM | POA: Diagnosis not present

## 2019-07-28 DIAGNOSIS — M9902 Segmental and somatic dysfunction of thoracic region: Secondary | ICD-10-CM | POA: Diagnosis not present

## 2019-08-04 DIAGNOSIS — M9901 Segmental and somatic dysfunction of cervical region: Secondary | ICD-10-CM | POA: Diagnosis not present

## 2019-08-04 DIAGNOSIS — M9904 Segmental and somatic dysfunction of sacral region: Secondary | ICD-10-CM | POA: Diagnosis not present

## 2019-08-04 DIAGNOSIS — M5030 Other cervical disc degeneration, unspecified cervical region: Secondary | ICD-10-CM | POA: Diagnosis not present

## 2019-08-04 DIAGNOSIS — M5136 Other intervertebral disc degeneration, lumbar region: Secondary | ICD-10-CM | POA: Diagnosis not present

## 2019-08-04 DIAGNOSIS — M9902 Segmental and somatic dysfunction of thoracic region: Secondary | ICD-10-CM | POA: Diagnosis not present

## 2019-08-04 DIAGNOSIS — M9903 Segmental and somatic dysfunction of lumbar region: Secondary | ICD-10-CM | POA: Diagnosis not present

## 2019-08-09 DIAGNOSIS — M5136 Other intervertebral disc degeneration, lumbar region: Secondary | ICD-10-CM | POA: Diagnosis not present

## 2019-08-09 DIAGNOSIS — M9901 Segmental and somatic dysfunction of cervical region: Secondary | ICD-10-CM | POA: Diagnosis not present

## 2019-08-09 DIAGNOSIS — M5030 Other cervical disc degeneration, unspecified cervical region: Secondary | ICD-10-CM | POA: Diagnosis not present

## 2019-08-09 DIAGNOSIS — M9903 Segmental and somatic dysfunction of lumbar region: Secondary | ICD-10-CM | POA: Diagnosis not present

## 2019-08-09 DIAGNOSIS — M9902 Segmental and somatic dysfunction of thoracic region: Secondary | ICD-10-CM | POA: Diagnosis not present

## 2019-08-09 DIAGNOSIS — M9904 Segmental and somatic dysfunction of sacral region: Secondary | ICD-10-CM | POA: Diagnosis not present

## 2019-08-16 DIAGNOSIS — D631 Anemia in chronic kidney disease: Secondary | ICD-10-CM | POA: Diagnosis not present

## 2019-08-16 DIAGNOSIS — N2581 Secondary hyperparathyroidism of renal origin: Secondary | ICD-10-CM | POA: Diagnosis not present

## 2019-08-16 DIAGNOSIS — N184 Chronic kidney disease, stage 4 (severe): Secondary | ICD-10-CM | POA: Diagnosis not present

## 2019-08-16 DIAGNOSIS — E785 Hyperlipidemia, unspecified: Secondary | ICD-10-CM | POA: Diagnosis not present

## 2019-08-16 DIAGNOSIS — E1122 Type 2 diabetes mellitus with diabetic chronic kidney disease: Secondary | ICD-10-CM | POA: Diagnosis not present

## 2019-08-16 DIAGNOSIS — I129 Hypertensive chronic kidney disease with stage 1 through stage 4 chronic kidney disease, or unspecified chronic kidney disease: Secondary | ICD-10-CM | POA: Diagnosis not present

## 2019-09-28 DIAGNOSIS — H26493 Other secondary cataract, bilateral: Secondary | ICD-10-CM | POA: Diagnosis not present

## 2019-09-28 DIAGNOSIS — H43813 Vitreous degeneration, bilateral: Secondary | ICD-10-CM | POA: Diagnosis not present

## 2019-09-28 DIAGNOSIS — E113213 Type 2 diabetes mellitus with mild nonproliferative diabetic retinopathy with macular edema, bilateral: Secondary | ICD-10-CM | POA: Diagnosis not present

## 2019-09-28 DIAGNOSIS — H52202 Unspecified astigmatism, left eye: Secondary | ICD-10-CM | POA: Diagnosis not present

## 2019-10-06 DIAGNOSIS — I129 Hypertensive chronic kidney disease with stage 1 through stage 4 chronic kidney disease, or unspecified chronic kidney disease: Secondary | ICD-10-CM | POA: Diagnosis not present

## 2019-10-06 DIAGNOSIS — E785 Hyperlipidemia, unspecified: Secondary | ICD-10-CM | POA: Diagnosis not present

## 2019-10-06 DIAGNOSIS — E669 Obesity, unspecified: Secondary | ICD-10-CM | POA: Diagnosis not present

## 2019-10-06 DIAGNOSIS — E1129 Type 2 diabetes mellitus with other diabetic kidney complication: Secondary | ICD-10-CM | POA: Diagnosis not present

## 2019-10-06 DIAGNOSIS — F39 Unspecified mood [affective] disorder: Secondary | ICD-10-CM | POA: Diagnosis not present

## 2019-10-06 DIAGNOSIS — N184 Chronic kidney disease, stage 4 (severe): Secondary | ICD-10-CM | POA: Diagnosis not present

## 2019-10-12 DIAGNOSIS — H26492 Other secondary cataract, left eye: Secondary | ICD-10-CM | POA: Diagnosis not present

## 2019-10-26 DIAGNOSIS — H26491 Other secondary cataract, right eye: Secondary | ICD-10-CM | POA: Diagnosis not present

## 2019-12-04 DIAGNOSIS — N184 Chronic kidney disease, stage 4 (severe): Secondary | ICD-10-CM | POA: Diagnosis not present

## 2019-12-04 DIAGNOSIS — I129 Hypertensive chronic kidney disease with stage 1 through stage 4 chronic kidney disease, or unspecified chronic kidney disease: Secondary | ICD-10-CM | POA: Diagnosis not present

## 2019-12-04 DIAGNOSIS — E1122 Type 2 diabetes mellitus with diabetic chronic kidney disease: Secondary | ICD-10-CM | POA: Diagnosis not present

## 2019-12-04 DIAGNOSIS — N2581 Secondary hyperparathyroidism of renal origin: Secondary | ICD-10-CM | POA: Diagnosis not present

## 2019-12-04 DIAGNOSIS — D631 Anemia in chronic kidney disease: Secondary | ICD-10-CM | POA: Diagnosis not present

## 2019-12-18 DIAGNOSIS — N184 Chronic kidney disease, stage 4 (severe): Secondary | ICD-10-CM | POA: Diagnosis not present

## 2019-12-18 DIAGNOSIS — I10 Essential (primary) hypertension: Secondary | ICD-10-CM | POA: Diagnosis not present

## 2020-01-09 ENCOUNTER — Ambulatory Visit (INDEPENDENT_AMBULATORY_CARE_PROVIDER_SITE_OTHER): Payer: PPO | Admitting: Ophthalmology

## 2020-01-09 ENCOUNTER — Encounter (INDEPENDENT_AMBULATORY_CARE_PROVIDER_SITE_OTHER): Payer: Self-pay | Admitting: Ophthalmology

## 2020-01-09 ENCOUNTER — Other Ambulatory Visit: Payer: Self-pay

## 2020-01-09 DIAGNOSIS — E113591 Type 2 diabetes mellitus with proliferative diabetic retinopathy without macular edema, right eye: Secondary | ICD-10-CM | POA: Diagnosis not present

## 2020-01-09 DIAGNOSIS — H35371 Puckering of macula, right eye: Secondary | ICD-10-CM

## 2020-01-09 DIAGNOSIS — E113512 Type 2 diabetes mellitus with proliferative diabetic retinopathy with macular edema, left eye: Secondary | ICD-10-CM | POA: Diagnosis not present

## 2020-01-09 DIAGNOSIS — H4312 Vitreous hemorrhage, left eye: Secondary | ICD-10-CM | POA: Diagnosis not present

## 2020-01-09 NOTE — Assessment & Plan Note (Signed)

## 2020-01-09 NOTE — Assessment & Plan Note (Signed)

## 2020-01-09 NOTE — Progress Notes (Signed)
01/09/2020     CHIEF COMPLAINT Patient presents for Retina Follow Up (6 Month Diabetic F/U OU//Pt denies noticeable changes to [place] OU since last visit. Pt denies ocular pain, flashes of light, or floaters OU. //A1c: 6.7, 07/2019 approx/LBS: 156 last night)   HISTORY OF PRESENT ILLNESS: Rhonda Clayton is a 74 y.o. female who presents to the clinic today for:   HPI    Retina Follow Up    Patient presents with  Diabetic Retinopathy.  In both eyes.  This started 6 months ago.  Severity is mild.  Duration of 6 months.  Since onset it is stable. Additional comments: 6 Month Diabetic F/U OU  Pt denies noticeable changes to [place] OU since last visit. Pt denies ocular pain, flashes of light, or floaters OU.   A1c: 6.7, 07/2019 approx LBS: 156 last night       Last edited by Rockie Neighbours, [place] on 01/09/2020  1:46 PM. (History)      Referring physician: Prince Solian, MD [place],   [id number]  HISTORICAL INFORMATION:   Selected notes from the [facility]    Lab Results  Component Value Date   HGBA1C 8.9 (H) 05/09/2014     CURRENT MEDICATIONS: No current outpatient medications on file. (Ophthalmic Drugs)   No current facility-administered medications for this visit. (Ophthalmic Drugs)   Current Outpatient Medications (Other)  Medication Sig  . amLODipine (NORVASC) 2.5 MG tablet 2 tablets.  Marland Kitchen aspirin EC 81 MG tablet Take 81 mg by mouth daily.  . betamethasone dipropionate (DIPROLENE) 0.05 % cream Apply topically 2 (two) times daily.  . chlorthalidone (HYGROTON) 25 MG tablet Take 25 mg by mouth daily.  . clobetasol ointment (TEMOVATE) 2.70 % Apply 1 application topically at bedtime.  . Insulin Aspart (NOVOLOG [place]) Inject 22 Units into the skin 2 (two) times daily.  Marland Kitchen levothyroxine (SYNTHROID, LEVOTHROID) 112 MCG tablet Take 100 mcg by mouth daily.   Marland Kitchen telmisartan-hydrochlorothiazide (MICARDIS HCT) 80-25 MG tablet Take 1 tablet by mouth daily.    No current facility-administered medications for this visit. (Other)      REVIEW OF SYSTEMS:    ALLERGIES Allergies  Allergen Reactions  . Coffee Flavor Other (See Comments)    Sick on stomach, sneezing, backed up  . Cephalexin Other (See Comments)    Yeast infection  . Ciprofloxacin Other (See Comments)    tendinitis  . Oatmeal Other (See Comments)    Sneezing and drainage  . Sulfa Antibiotics Other (See Comments)    Yeast infection    PAST MEDICAL HISTORY Past Medical History:  Diagnosis Date  . Anemia   . Cystocele   . DJD (degenerative joint disease)   . Esophageal stricture   . Essential hypertension   . Hyperlipidemia   . Hypothyroidism   . Lichen sclerosus   . Osteopenia   . RBBB with left anterior fascicular block   . Rectocele   . Stress incontinence   . Torn rotator cuff    Bilateral  . Type 2 diabetes mellitus ([place])   . Venous insufficiency    Past Surgical History:  Procedure Laterality Date  . ABDOMINAL EXPOSURE N/A 05/09/2014   Procedure: ABDOMINAL EXPOSURE;  Surgeon: Rosetta Posner, MD;  Location: [facility];  Service: Vascular;  Laterality: N/A;  . ANTERIOR LAT LUMBAR FUSION Right 05/09/2014   Procedure: Lumbar three-four, Lumbar four-five Anterior lateral lumbar fusion;  Surgeon: Erline Levine, MD;  Location: [place] NEURO ORS;  Service: Neurosurgery;  Laterality: Right;  . ANTERIOR LUMBAR FUSION N/A 05/09/2014   Procedure: Stage 1:Lumbar four-five, Lumbar five-Sacral one Anterior lumbar interbody fusion with Dr. Donnetta Hutching for approach ;  Surgeon: Erline Levine, MD;  Location: [place] NEURO ORS;  Service: Neurosurgery;  Laterality: N/A;  . CHOLECYSTECTOMY  1987  . INCONTINENCE SURGERY    . LUMBAR PERCUTANEOUS PEDICLE SCREW 3 LEVEL N/A 05/10/2014   Procedure: Stage 2: Percutaneous Pedicle Screws; Laminectomy L3-4  ;  Surgeon: Erline Levine, MD;  Location: [place] NEURO ORS;  Service: Neurosurgery;  Laterality: N/A;  Stage 2: Percutaneous Pedicle Screws T10 to Ileum;  Laminectomy L3-4    . RECTAL SURGERY    . VAGINA SURGERY  2018   Josph Macho Touch- Vaginal Dryness    FAMILY HISTORY Family History  Problem Relation Age of Onset  . Multiple myeloma Mother   . Cancer Maternal Aunt        Cancer in the mouth - dipped snuff  . Heart disease Brother   . Diabetes Father   . Colon polyps Brother   . Heart disease Maternal Uncle   . Heart disease Paternal Uncle   . Heart disease Paternal Grandfather     SOCIAL HISTORY Social History   Tobacco Use  . Smoking status: Never Smoker  . Smokeless tobacco: Never Used  Vaping Use  . Vaping Use: Never used  Substance Use Topics  . Alcohol use: No  . Drug use: Yes    Types: Hydromorphone         OPHTHALMIC EXAM: Base Eye Exam    Visual Acuity (ETDRS)      Right Left   Dist [place] 20/20 -2 20/25 +1       Tonometry (Tonopen, 1:46 PM)      Right Left   Pressure 16 18       Pupils      Pupils Dark Light Shape React APD   Right PERRL 4 3 Round Brisk None   Left PERRL 4 3 Round Brisk None       Visual Fields (Counting fingers)      Left Right    Full Full       Extraocular Movement      Right Left    Full Full       Neuro/Psych    Oriented x3: Yes   Mood/Affect: Normal       Dilation    Both eyes: 1.0% Mydriacyl, 2.5% Phenylephrine @ 1:49 PM        Slit Lamp and Fundus Exam    External Exam      Right Left   External Normal Normal       Slit Lamp Exam      Right Left   Lids/Lashes Normal Normal   Conjunctiva/Sclera White and quiet White and quiet   Cornea Clear Clear   Anterior Chamber Deep and quiet Deep and quiet   Iris Round and reactive Round and reactive   Lens Posterior chamber intraocular lens, centered, 1+ Posterior capsular opacification Posterior chamber intraocular lens, centered, 1+ Posterior capsular opacification   Anterior Vitreous Normal Normal       Fundus Exam      Right Left   Posterior Vitreous Posterior vitreous detachment, Central vitreous  floaters Posterior vitreous detachment   Disc Normal Normal   C/D Ratio 0.45 0.5   Macula Microaneurysms, no macular thickening Microaneurysms, no macular thickening   Vessels PDR-quiet PDR-quiet   Periphery Good PRP, light pigmented scars  from prior laser yellow  Good PRP, light pigmented scars from prior laser yellow           IMAGING AND PROCEDURES  Imaging and Procedures for 01/09/20  OCT, Retina - OU - Both Eyes       Right Eye Quality was good. Scan locations included subfoveal. Central Foveal Thickness: 247. Progression has been stable. Findings include epiretinal membrane, abnormal foveal contour.   Left Eye Quality was good. Scan locations included subfoveal. Central Foveal Thickness: 245. Progression has been stable. Findings include normal foveal contour.   Notes Incidental note of posterior vitreous detachment OU.  Minor epiretinal membrane nasal to the fovea OD, with no topographic distortion will observe                ASSESSMENT/PLAN:  Macular pucker, right eye The nature of macular pucker (epiretinal membrane ERM) was discussed with the patient as well as threshold criteria for vitrectomy surgery. I explained that in rare cases another surgery is needed to actually remove a second wrinkle should it regrow.  Most often, the epiretinal membrane and underlying wrinkled internal limiting membrane are removed with the first surgery, to accomplish the goals.   If the operative eye is Phakic (natural lens still present), cataract surgery is often recommended prior to Vitrectomy. This will enable the retina surgeon to have the best view during surgery and the patient to obtain optimal results in the future. Treatment options were discussed.  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 The patient was advised  to maintain good glucose, blood pressure, monitor kidney function and serum lipid control as advised by personal physician. Rare risk for reactivation of progression exist with untreated severe anemia, untreated renal failure, untreated heart failure, and smoking. Complete avoidance of smoking was recommended. The chance of recurrent proliferative diabetic retinopathy was discussed as well as the chance of vitreous hemorrhage for which further treatments may be necessary.   Explained to the patient that the quiescent  proliferative diabetic retinopathy disease is unlikely to ever worsen.  Worsening factors would include however severe anemia, hypertension out-of-control or impending renal failure.  Vitreous hemorrhage, left (HCC) Condition is cleared OS      ICD-10-CM   1. 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 Proliferative diabetic retinopathy of right eye associated with type 2 diabetes mellitus, macular edema presence unspecified (HCC)  E11.3591 OCT, Retina - OU - Both Eyes  3. Macular pucker, right eye  H35.371   4. Vitreous hemorrhage, left (HCC)  H43.12     1.  No interval change in stage of PDR OU, now quiet status post PRP, room for more PRP OU if progression were to occur.  2.  Vitreous hemorrhage from before has cleared  3.  Preretinal membrane OD is minor with no impact on acuity monitor every 6 months  Ophthalmic Meds Ordered this visit:  No orders of the defined types were placed in this encounter.      Return in about 6 months (around 07/09/2020) for dilate, COLOR FP.  There are no Patient Instructions on file for this visit.   Explained the diagnoses, plan, and follow up with the patient and they expressed understanding.  Patient expressed understanding of the importance of proper follow up care.  Clent Demark Savayah Waltrip M.D. Diseases & Surgery of the Retina  and Vitreous Retina & Diabetic [place] 01/09/20     Abbreviations: M myopia (nearsighted); A astigmatism; H hyperopia (farsighted); P presbyopia; Mrx spectacle prescription;  CTL contact lenses; OD right eye; OS left eye; OU both eyes  XT exotropia; ET esotropia; PEK punctate epithelial keratitis; PEE punctate epithelial erosions; DES dry eye syndrome; MGD meibomian gland dysfunction; ATs artificial tears; PFAT's preservative free artificial tears; [place] nuclear sclerotic cataract; PSC posterior subcapsular cataract; ERM epi-retinal membrane; PVD posterior vitreous detachment; RD retinal detachment; DM diabetes mellitus; DR diabetic retinopathy; NPDR non-proliferative diabetic retinopathy; PDR proliferative diabetic retinopathy; CSME clinically significant macular edema; DME diabetic macular edema; dbh dot blot hemorrhages; CWS cotton wool spot; POAG primary open angle glaucoma; C/D cup-to-disc ratio; HVF humphrey visual field; GVF goldmann visual field; OCT optical coherence tomography; IOP intraocular pressure; BRVO Branch retinal vein occlusion; CRVO central retinal vein occlusion; CRAO central retinal artery occlusion; BRAO branch retinal artery occlusion; RT retinal tear; SB scleral buckle; PPV pars plana vitrectomy; VH Vitreous hemorrhage; PRP panretinal laser photocoagulation; IVK intravitreal kenalog; VMT vitreomacular traction; MH Macular hole;  NVD neovascularization of the disc; NVE neovascularization elsewhere; AREDS age related eye disease study; ARMD age related macular degeneration; POAG primary open angle glaucoma; EBMD epithelial/anterior basement membrane dystrophy; ACIOL anterior chamber intraocular lens; IOL intraocular lens; PCIOL posterior chamber intraocular lens; Phaco/IOL phacoemulsification with intraocular lens placement; [place] photorefractive keratectomy; LASIK laser assisted in situ keratomileusis; HTN hypertension; DM diabetes mellitus; COPD chronic obstructive pulmonary  disease

## 2020-01-09 NOTE — Assessment & Plan Note (Signed)
Condition is cleared OS

## 2020-01-11 ENCOUNTER — Encounter (INDEPENDENT_AMBULATORY_CARE_PROVIDER_SITE_OTHER): Payer: PPO | Admitting: Ophthalmology

## 2020-01-19 DIAGNOSIS — H4312 Vitreous hemorrhage, left eye: Secondary | ICD-10-CM | POA: Diagnosis not present

## 2020-01-19 DIAGNOSIS — Z961 Presence of intraocular lens: Secondary | ICD-10-CM | POA: Diagnosis not present

## 2020-01-19 DIAGNOSIS — E113553 Type 2 diabetes mellitus with stable proliferative diabetic retinopathy, bilateral: Secondary | ICD-10-CM | POA: Diagnosis not present

## 2020-01-24 ENCOUNTER — Other Ambulatory Visit: Payer: Self-pay

## 2020-01-24 ENCOUNTER — Encounter (INDEPENDENT_AMBULATORY_CARE_PROVIDER_SITE_OTHER): Payer: Self-pay | Admitting: Ophthalmology

## 2020-01-24 ENCOUNTER — Ambulatory Visit (INDEPENDENT_AMBULATORY_CARE_PROVIDER_SITE_OTHER): Payer: PPO | Admitting: Ophthalmology

## 2020-01-24 DIAGNOSIS — E113512 Type 2 diabetes mellitus with proliferative diabetic retinopathy with macular edema, left eye: Secondary | ICD-10-CM

## 2020-01-24 DIAGNOSIS — H4312 Vitreous hemorrhage, left eye: Secondary | ICD-10-CM | POA: Diagnosis not present

## 2020-01-24 NOTE — Assessment & Plan Note (Signed)
Review of PRP in the past, will add additional PRP now that vitreous hemorrhage has occurred should PDR be progressing, though this is not likely an yet vitreous traction accounts for PDR/vitreous hemorrhage in case like this

## 2020-01-24 NOTE — Assessment & Plan Note (Signed)
Hemorrhage accounting for  decline in vision which has cleared slightly within the last 5 days.  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  Patient instructed in head of bed elevation to help and assist clearing  Patient reassured vitreous hemorrhage can be cleared once and for all vitrectomy at any time that her activities of daily living mandate improvement in acuity.  I assured the patient medically there is no urgency to rush to vitrectomy surgery for the left eye at this moment.  He is instructed to contact us promptly if new visual acuity declines or distortions occur

## 2020-01-24 NOTE — Progress Notes (Signed)
01/24/2020     CHIEF COMPLAINT Patient presents for Retina Follow Up (WIP-Bleed OS, ref'd by Katy Fitch (saw on 01/19/20)///Pt reports that 12/31 she woke up w/ "big, black streaks across my eye, then the gray cloud started later", pt states symptoms have been there since that day. /////Last BS: 151 this AM                                    Last A1C: 6.7  taken 08/2019)   HISTORY OF PRESENT ILLNESS: Rhonda Clayton is a 75 y.o. female who presents to the clinic today for:   HPI    Retina Follow Up    Patient presents with  Other.  In left eye.  This started 5 days ago.  Duration of 5 days.  Since onset it is stable. Additional comments: WIP-Bleed OS, ref'd by [place] (saw on 01/19/20)   Pt reports that 12/31 she woke up w/ "big, black streaks across my eye, then the gray cloud started later", pt states symptoms have been there since that day.      Last BS: 151 this AM                                    Last A1C: 6.7  taken 08/2019       Last edited by Nichola Sizer D on 01/24/2020  9:47 AM. (History)      Referring physician: Prince Solian, MD [place],  [place] [address]  HISTORICAL INFORMATION:   Selected notes from the [facility]    Lab Results  Component Value Date   HGBA1C 8.9 (H) 05/09/2014     CURRENT MEDICATIONS: No current outpatient medications on file. (Ophthalmic Drugs)   No current facility-administered medications for this visit. (Ophthalmic Drugs)   Current Outpatient Medications (Other)  Medication Sig  . amLODipine (NORVASC) 2.5 MG tablet 2 tablets.  Marland Kitchen aspirin EC 81 MG tablet Take 81 mg by mouth daily.  . betamethasone dipropionate (DIPROLENE) 0.05 % cream Apply topically 2 (two) times daily.  . chlorthalidone (HYGROTON) 25 MG tablet Take 25 mg by mouth daily.  . clobetasol ointment (TEMOVATE) 7.59 % Apply 1 application topically at bedtime.  . Insulin Aspart (NOVOLOG [place]) Inject 22 Units into the skin 2 (two) times daily.   Marland Kitchen levothyroxine (SYNTHROID, LEVOTHROID) 112 MCG tablet Take 100 mcg by mouth daily.   Marland Kitchen telmisartan-hydrochlorothiazide (MICARDIS HCT) 80-25 MG tablet Take 1 tablet by mouth daily.   No current facility-administered medications for this visit. (Other)      REVIEW OF SYSTEMS:    ALLERGIES Allergies  Allergen Reactions  . Coffee Flavor Other (See Comments)    Sick on stomach, sneezing, backed up  . Cephalexin Other (See Comments)    Yeast infection  . Ciprofloxacin Other (See Comments)    tendinitis  . Oatmeal Other (See Comments)    Sneezing and drainage  . Sulfa Antibiotics Other (See Comments)    Yeast infection    PAST MEDICAL HISTORY Past Medical History:  Diagnosis Date  . Anemia   . Cystocele   . DJD (degenerative joint disease)   . Esophageal stricture   . Essential hypertension   . Hyperlipidemia   . Hypothyroidism   . Lichen sclerosus   . Osteopenia   . RBBB with left anterior fascicular  block   . Rectocele   . Stress incontinence   . Torn rotator cuff    Bilateral  . Type 2 diabetes mellitus ([place])   . Venous insufficiency    Past Surgical History:  Procedure Laterality Date  . ABDOMINAL EXPOSURE N/A 05/09/2014   Procedure: ABDOMINAL EXPOSURE;  Surgeon: Rosetta Posner, MD;  Location: [facility];  Service: Vascular;  Laterality: N/A;  . ANTERIOR LAT LUMBAR FUSION Right 05/09/2014   Procedure: Lumbar three-four, Lumbar four-five Anterior lateral lumbar fusion;  Surgeon: Erline Levine, MD;  Location: [facility];  Service: Neurosurgery;  Laterality: Right;  . ANTERIOR LUMBAR FUSION N/A 05/09/2014   Procedure: Stage 1:Lumbar four-five, Lumbar five-Sacral one Anterior lumbar interbody fusion with Dr. Donnetta Hutching for approach ;  Surgeon: Erline Levine, MD;  Location: [facility];  Service: Neurosurgery;  Laterality: N/A;  . CHOLECYSTECTOMY  1987  . INCONTINENCE SURGERY    . LUMBAR PERCUTANEOUS PEDICLE SCREW 3 LEVEL N/A 05/10/2014   Procedure: Stage 2: Percutaneous  Pedicle Screws; Laminectomy L3-4  ;  Surgeon: Erline Levine, MD;  Location: [facility];  Service: Neurosurgery;  Laterality: N/A;  Stage 2: Percutaneous Pedicle Screws T10 to Ileum; Laminectomy L3-4    . RECTAL SURGERY    . VAGINA SURGERY  2018   [place] Touch- Vaginal Dryness    FAMILY HISTORY Family History  Problem Relation Age of Onset  . Multiple myeloma Mother   . Cancer Maternal Aunt        Cancer in the mouth - dipped snuff  . Heart disease Brother   . Diabetes Father   . Colon polyps Brother   . Heart disease Maternal Uncle   . Heart disease Paternal Uncle   . Heart disease Paternal Grandfather     SOCIAL HISTORY Social History   Tobacco Use  . Smoking status: Never Smoker  . Smokeless tobacco: Never Used  Vaping Use  . Vaping Use: Never used  Substance Use Topics  . Alcohol use: No  . Drug use: Yes    Types: Hydromorphone         OPHTHALMIC EXAM:  Base Eye Exam    Visual Acuity (ETDRS)      Right Left   Dist [place] 20/20 20/80 -2   Dist ph [place]  20/70 -1       Tonometry (Tonopen, 9:52 AM)      Right Left   Pressure 14 14       Pupils      Pupils Dark Light Shape React APD   Right PERRL 4 3 Round Brisk None   Left PERRL 4 3 Round Brisk None       Visual Fields (Counting fingers)      Left Right    Full Full       Extraocular Movement      Right Left    Full Full       Neuro/Psych    Oriented x3: Yes   Mood/Affect: Normal       Dilation    Left eye: 1.0% Mydriacyl, 2.5% Phenylephrine @ 9:52 AM        Slit Lamp and Fundus Exam    External Exam      Right Left   External Normal Normal       Slit Lamp Exam      Right Left   Lids/Lashes Normal Normal   Conjunctiva/Sclera White and quiet White and quiet   Cornea Clear Clear  Anterior Chamber Deep and quiet Deep and quiet   Iris Round and reactive Round and reactive   Lens Posterior chamber intraocular lens, centered, 1+ Posterior capsular opacification Posterior chamber  intraocular lens, centered, 1+ Posterior capsular opacification   Anterior Vitreous Normal Normal       Fundus Exam      Right Left   Posterior Vitreous Posterior vitreous detachment, Central vitreous floaters Posterior vitreous detachment, Vitreous hemorrhage 1+ central   Disc Normal Normal   C/D Ratio 0.45 0.5   Macula Normal, no macular thickening Microaneurysms, no macular thickening   Vessels PDR-quiet PDR-active   Periphery Good PRP, light pigmented scars from prior laser yellow  Good PRP, light pigmented scars from prior laser yellow , nearly 360          IMAGING AND PROCEDURES  Imaging and Procedures for 01/24/20  OCT, Retina - OU - Both Eyes       Right Eye Quality was good. Scan locations included subfoveal. Central Foveal Thickness: 247. Progression has been stable. Findings include epiretinal membrane.   Left Eye Quality was good. Central Foveal Thickness: 247. Progression has been stable. Findings include normal foveal contour.   Notes No interval change OU with minor epiretinal membrane nasally OD  OS with medial opacity from vitreous hemorrhage       Panretinal Photocoagulation - OS - Left Eye       Time Out Confirmed correct patient, procedure, site, and patient consented.   Anesthesia Topical anesthesia was used. Anesthetic medications included Proparacaine 0.5%.   Laser Information The type of laser was diode. Color was yellow. The duration in seconds was 0.04. The spot size was 390 microns. Laser power was 280. Total spots was 860.   Post-op The patient tolerated the procedure well. There were no complications. The patient received written and verbal post procedure care education.   Notes PRP applied inferiorly and inferotemporally and a clear area around the vitreous hemorrhage as well as nasal to the nerve and superiorly limited fashion and temporal to the macula                ASSESSMENT/PLAN:  Vitreous hemorrhage, left  (HCC) Hemorrhage accounting for  decline in vision which has cleared slightly within the last 5 days.  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  Patient instructed in head of bed elevation to help and assist clearing  Patient reassured vitreous hemorrhage can be cleared once and for all vitrectomy at any time that her activities of daily living mandate improvement in acuity.  I assured the patient medically there is no urgency to rush to vitrectomy surgery for the left eye at this moment.  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 Vitreous hemorrhage, left (HCC)  H43.12     1.  Dilate OS next consider PRP again more clearly the  vitreous hemorrhage occurs 2.  PRP OS completed today in addition to previous treatments  3.  Ophthalmic Meds Ordered this visit:  No orders of the defined types were placed in this encounter.      Return in about 3 weeks (around 02/14/2020) for dilate, OS, COLOR FP.  There are no Patient Instructions on file for this visit.   Explained the diagnoses, plan, and follow up with the patient and they expressed understanding.  Patient expressed understanding of the importance of proper follow up care.   Clent Demark Joniqua Sidle M.D. Diseases & Surgery of the Retina and Vitreous [facility] 01/24/20     Abbreviations: M myopia  (nearsighted); A astigmatism; H hyperopia (farsighted); P presbyopia; Mrx spectacle prescription;  CTL contact lenses; OD right eye; OS left eye; OU both eyes  XT exotropia; ET esotropia; PEK punctate epithelial keratitis; PEE punctate epithelial erosions; DES dry eye syndrome; MGD meibomian gland dysfunction; ATs artificial tears; PFAT's preservative free artificial tears; [place] nuclear sclerotic cataract; PSC posterior subcapsular cataract; ERM epi-retinal membrane; PVD posterior vitreous detachment; RD retinal detachment; DM diabetes mellitus; DR diabetic retinopathy; NPDR non-proliferative diabetic retinopathy; PDR proliferative diabetic retinopathy; CSME clinically significant macular edema; DME diabetic macular edema; dbh dot blot hemorrhages; CWS cotton wool spot; POAG primary open angle glaucoma; C/D cup-to-disc ratio; HVF humphrey visual field; GVF goldmann visual field; OCT optical coherence tomography; IOP intraocular pressure; BRVO Branch retinal vein occlusion; CRVO central retinal vein occlusion; CRAO central retinal artery occlusion; BRAO branch retinal artery occlusion; RT retinal tear; SB scleral buckle; PPV pars plana vitrectomy; VH Vitreous hemorrhage; PRP panretinal laser photocoagulation; IVK intravitreal kenalog; VMT vitreomacular traction; MH Macular hole;  NVD neovascularization of the disc; NVE neovascularization elsewhere; AREDS age related eye disease study; ARMD age related macular degeneration; POAG primary open angle glaucoma; EBMD epithelial/anterior basement membrane dystrophy; ACIOL anterior chamber intraocular lens; IOL intraocular lens; PCIOL posterior chamber intraocular lens; Phaco/IOL phacoemulsification with intraocular lens placement; [place] photorefractive keratectomy; LASIK laser assisted in situ keratomileusis; HTN hypertension; DM diabetes mellitus; COPD chronic obstructive pulmonary disease

## 2020-01-27 IMAGING — MG DIGITAL SCREENING BILATERAL MAMMOGRAM WITH TOMO AND CAD
8 series · 9 of 24 positions shown · non-contrast
Comparison: Previous exam(s).

CLINICAL DATA: Screening.

EXAM:
DIGITAL SCREENING BILATERAL MAMMOGRAM WITH TOMO AND CAD

[L CC synth-2D]
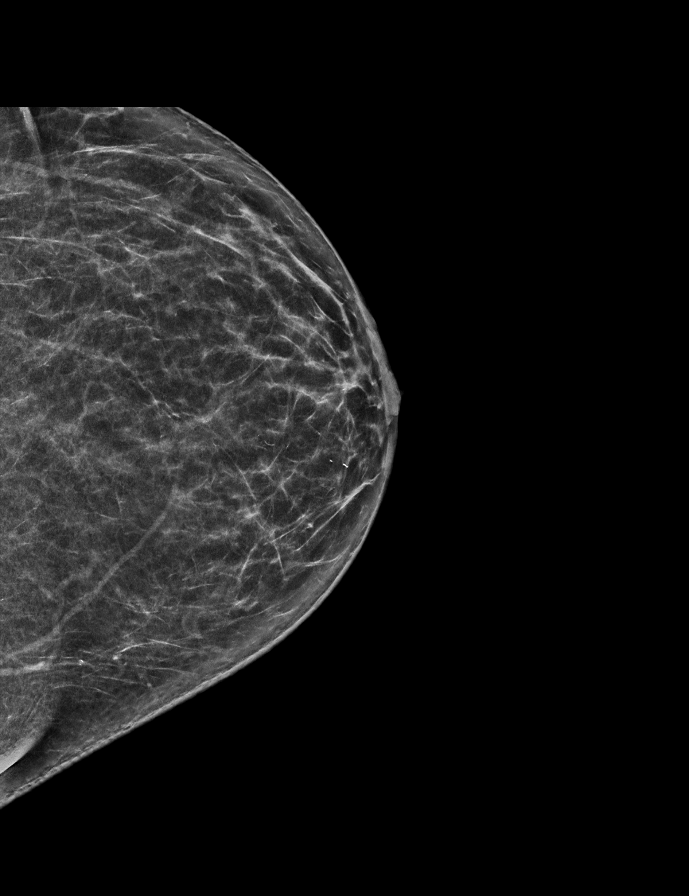

[R CC synth-2D]
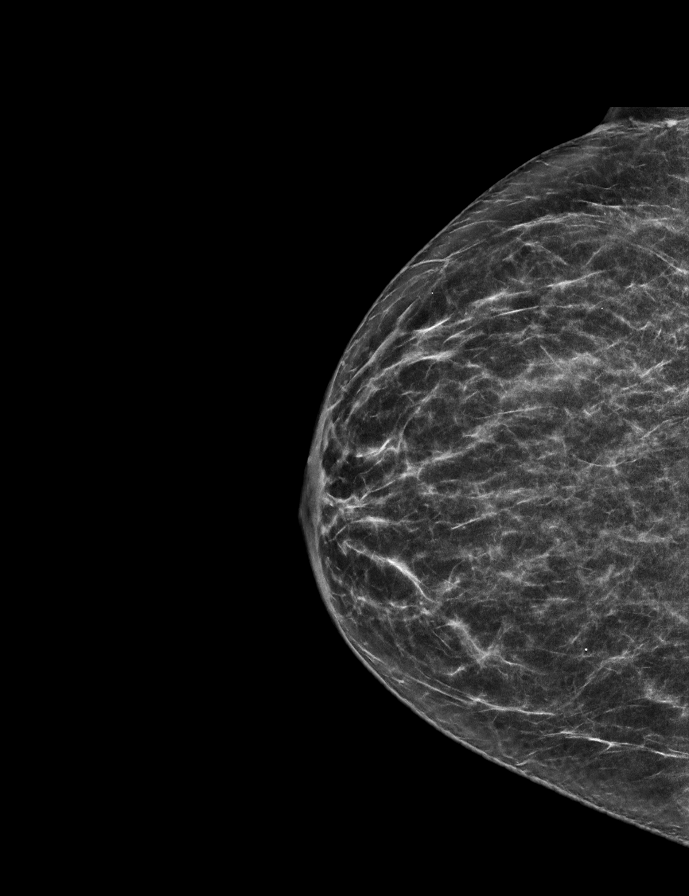

[L MLO synth-2D]
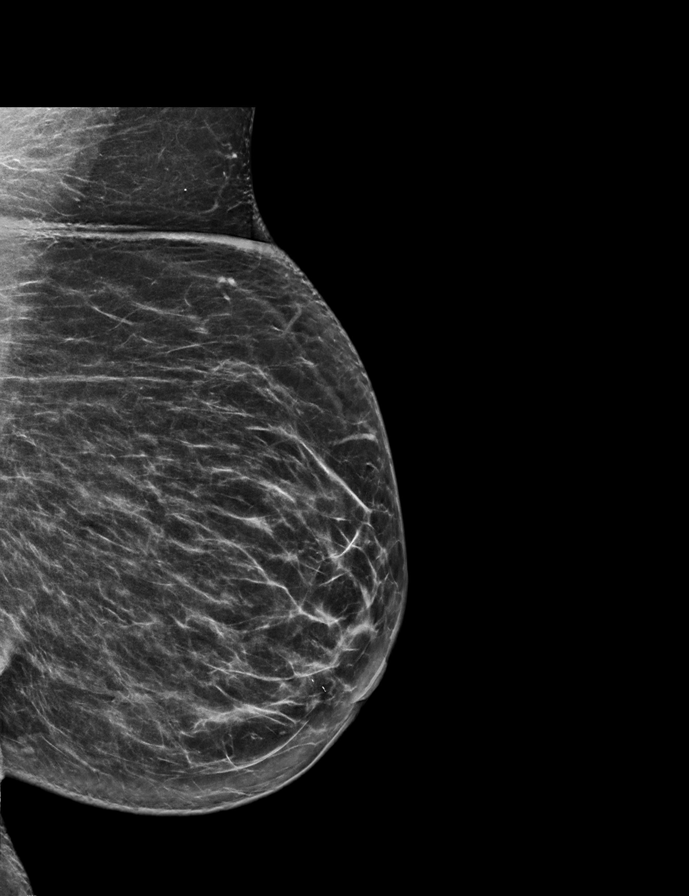

[R MLO synth-2D]
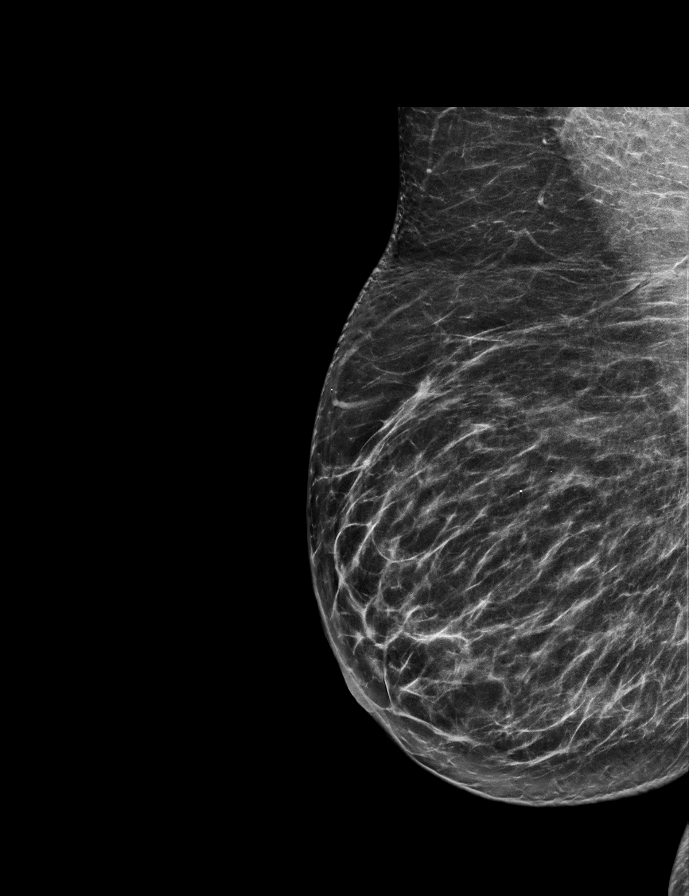

[L CC tomo · 2 of 61 frames shown]
[frame 20/61]
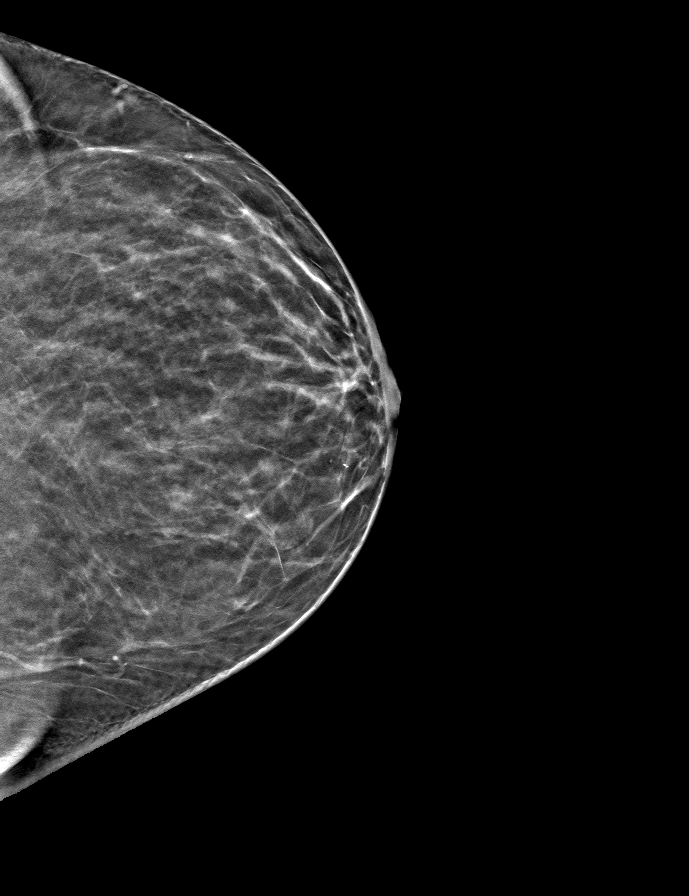
[frame 31/61]
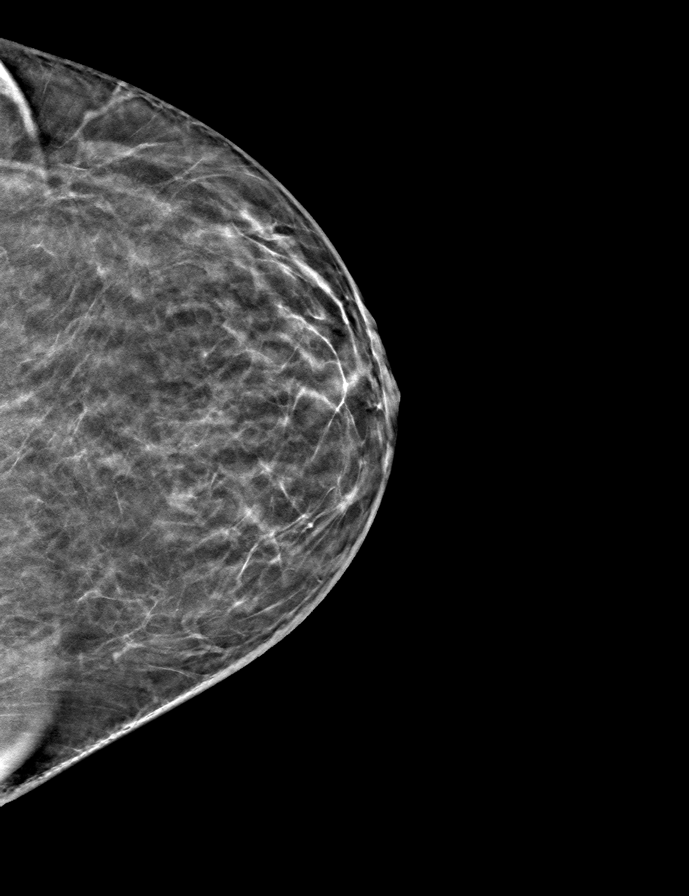

[R CC tomo · tomo slice 31/61.0]
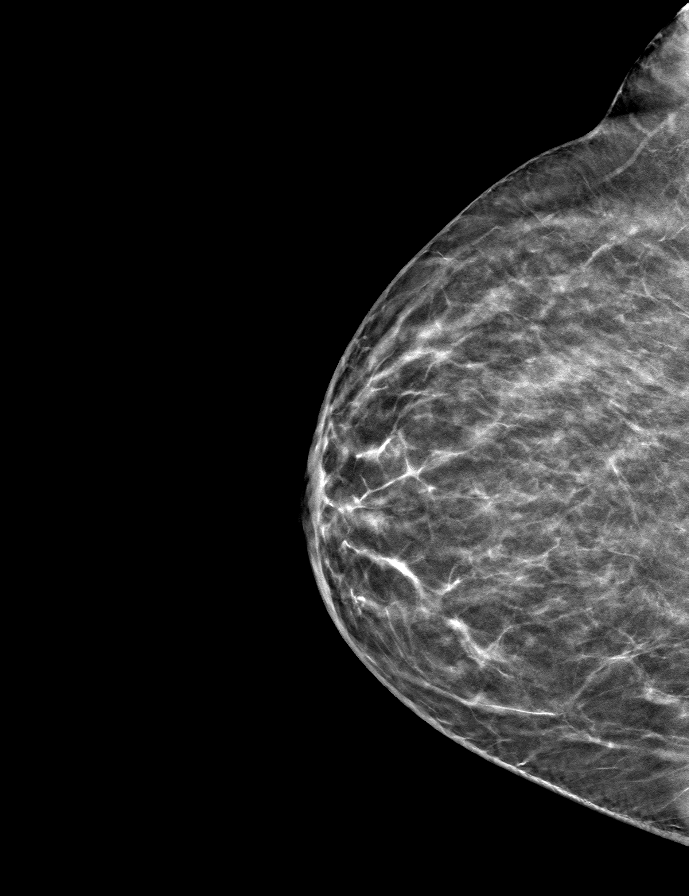

[L MLO tomo · tomo slice 35/69.0]
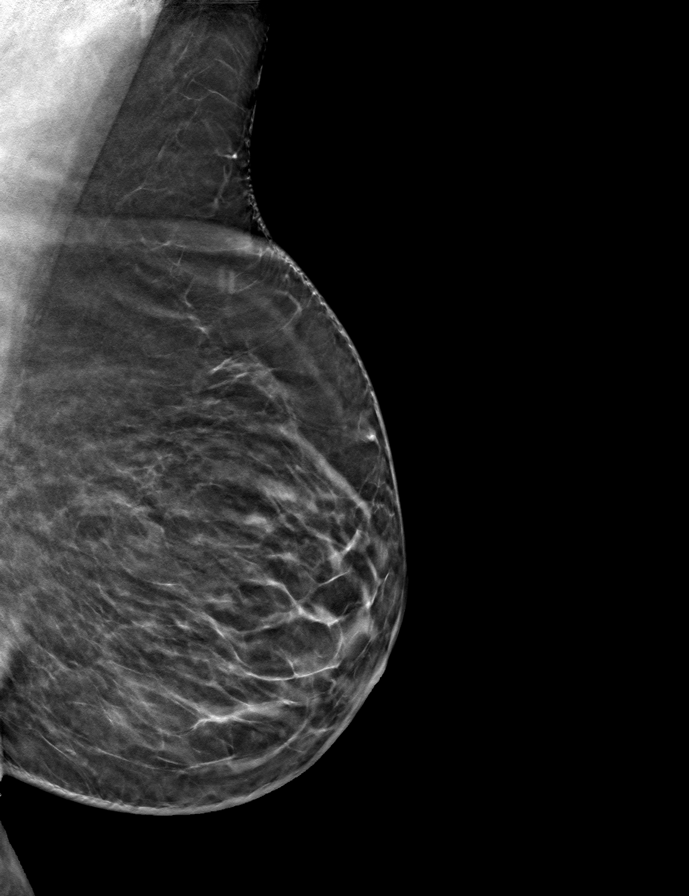

[R MLO tomo · tomo slice 35/70.0]
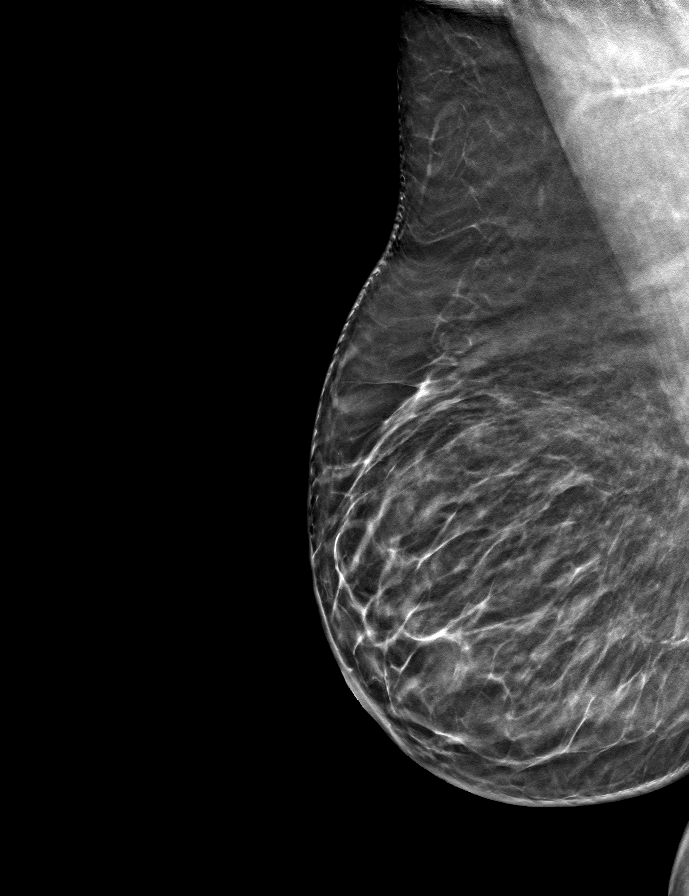

[9 of 24 positions shown; findings below may reference images not displayed]

ACR Breast Density Category b: There are scattered areas of
fibroglandular density.
FINDINGS: There are no findings suspicious for malignancy. Images were
processed with CAD.
IMPRESSION: No mammographic evidence of malignancy. A result letter of this
screening mammogram will be mailed directly to the patient.

RECOMMENDATION:
Screening mammogram in one year. (Code:[id number])

BI-RADS CATEGORY  1: Negative.

## 2020-02-14 ENCOUNTER — Encounter (INDEPENDENT_AMBULATORY_CARE_PROVIDER_SITE_OTHER): Payer: PPO | Admitting: Ophthalmology

## 2020-02-15 ENCOUNTER — Other Ambulatory Visit: Payer: Self-pay

## 2020-02-15 ENCOUNTER — Encounter (INDEPENDENT_AMBULATORY_CARE_PROVIDER_SITE_OTHER): Payer: Self-pay | Admitting: Ophthalmology

## 2020-02-15 ENCOUNTER — Ambulatory Visit (INDEPENDENT_AMBULATORY_CARE_PROVIDER_SITE_OTHER): Payer: PPO | Admitting: Ophthalmology

## 2020-02-15 DIAGNOSIS — E113512 Type 2 diabetes mellitus with proliferative diabetic retinopathy with macular edema, left eye: Secondary | ICD-10-CM

## 2020-02-15 DIAGNOSIS — H4312 Vitreous hemorrhage, left eye: Secondary | ICD-10-CM

## 2020-02-15 NOTE — Assessment & Plan Note (Signed)
Nonclearing vitreous hemorrhage left eye will likely need vitrectomy however patient having a scheduled root canal February 10.  She would like to wait before going to surgery for the clearance of the eye.

## 2020-02-15 NOTE — Assessment & Plan Note (Signed)
Vitreous and preretinal hemorrhage covers the inferior retina which does require more PRP, not able to deliver today.  We Rhonda Clayton have the patient follow-up in 3 weeks for reevaluation and consideration of either laser photocoagulation and/or proceeding with plans for surgical intervention via vitrectomy endolaser photocoagulation

## 2020-02-15 NOTE — Progress Notes (Signed)
  02/15/2020     CHIEF COMPLAINT Patient presents for Retina Follow Up (3 Week F/U OS//Pt sts "grey" has improved OS, but "black mass is still there." VA stable OD. //LBS: 113 last PM)   HISTORY OF PRESENT ILLNESS: Rhonda Clayton is a 74 y.o. female who presents to the clinic today for:   HPI    Retina Follow Up    Patient presents with  Diabetic Retinopathy.  In left eye.  This started 3 weeks ago.  Severity is moderate.  Duration of 3 weeks.  Since onset it is gradually improving. Additional comments: 3 Week F/U OS  Pt sts "grey" has improved OS, but "black mass is still there." VA stable OD.   LBS: 113 last PM       Last edited by Kennerly, Paige, COA on 02/15/2020  2:14 PM. (History)      Referring physician: Avva, Ravisankar, MD [address] [place],  [place] [address]  HISTORICAL INFORMATION:   Selected notes from the medical record:     Lab Results  Component Value Date   HGBA1C 8.9 (H) 05/09/2014     CURRENT MEDICATIONS: No current outpatient medications on file. (Ophthalmic Drugs)   No current facility-administered medications for this visit. (Ophthalmic Drugs)   Current Outpatient Medications (Other)  Medication Sig  . amLODipine (NORVASC) 2.5 MG tablet 2 tablets.  . aspirin EC 81 MG tablet Take 81 mg by mouth daily.  . betamethasone dipropionate (DIPROLENE) 0.05 % cream Apply topically 2 (two) times daily.  . chlorthalidone (HYGROTON) 25 MG tablet Take 25 mg by mouth daily.  . clobetasol ointment (TEMOVATE) 0.05 % Apply 1 application topically at bedtime.  . Insulin Aspart (NOVOLOG [place]) Inject 22 Units into the skin 2 (two) times daily.  . levothyroxine (SYNTHROID, LEVOTHROID) 112 MCG tablet Take 100 mcg by mouth daily.   . telmisartan-hydrochlorothiazide (MICARDIS HCT) 80-25 MG tablet Take 1 tablet by mouth daily.   No current facility-administered medications for this visit. (Other)      REVIEW OF SYSTEMS:    ALLERGIES Allergies   Allergen Reactions  . Coffee Flavor Other (See Comments)    Sick on stomach, sneezing, backed up  . Cephalexin Other (See Comments)    Yeast infection  . Ciprofloxacin Other (See Comments)    tendinitis  . Oatmeal Other (See Comments)    Sneezing and drainage  . Sulfa Antibiotics Other (See Comments)    Yeast infection    PAST MEDICAL HISTORY Past Medical History:  Diagnosis Date  . Anemia   . Cystocele   . DJD (degenerative joint disease)   . Esophageal stricture   . Essential hypertension   . Hyperlipidemia   . Hypothyroidism   . Lichen sclerosus   . Osteopenia   . RBBB with left anterior fascicular block   . Rectocele   . Stress incontinence   . Torn rotator cuff    Bilateral  . Type 2 diabetes mellitus (HCC)   . Venous insufficiency    Past Surgical History:  Procedure Laterality Date  . ABDOMINAL EXPOSURE N/A 05/09/2014   Procedure: ABDOMINAL EXPOSURE;  Surgeon: Todd F Early, MD;  Location: [facility];  Service: Vascular;  Laterality: N/A;  . ANTERIOR LAT LUMBAR FUSION Right 05/09/2014   Procedure: Lumbar three-four, Lumbar four-five Anterior lateral lumbar fusion;  Surgeon: Joseph Stern, MD;  Location: [facility];  Service: Neurosurgery;  Laterality: Right;  . ANTERIOR LUMBAR FUSION N/A 05/09/2014   Procedure: Stage 1:Lumbar four-five,   Lumbar five-Sacral one Anterior lumbar interbody fusion with Dr. Early for approach ;  Surgeon: Joseph Stern, MD;  Location: [facility];  Service: Neurosurgery;  Laterality: N/A;  . CHOLECYSTECTOMY  1987  . INCONTINENCE SURGERY    . LUMBAR PERCUTANEOUS PEDICLE SCREW 3 LEVEL N/A 05/10/2014   Procedure: Stage 2: Percutaneous Pedicle Screws; Laminectomy L3-4  ;  Surgeon: Joseph Stern, MD;  Location: [facility];  Service: Neurosurgery;  Laterality: N/A;  Stage 2: Percutaneous Pedicle Screws T10 to Ileum; Laminectomy L3-4    . RECTAL SURGERY    . VAGINA SURGERY  2018   [place] Touch- Vaginal Dryness    FAMILY HISTORY Family  History  Problem Relation Age of Onset  . Multiple myeloma Mother   . Cancer Maternal Aunt        Cancer in the mouth - dipped snuff  . Heart disease Brother   . Diabetes Father   . Colon polyps Brother   . Heart disease Maternal Uncle   . Heart disease Paternal Uncle   . Heart disease Paternal Grandfather     SOCIAL HISTORY Social History   Tobacco Use  . Smoking status: Never Smoker  . Smokeless tobacco: Never Used  Vaping Use  . Vaping Use: Never used  Substance Use Topics  . Alcohol use: No  . Drug use: Yes    Types: Hydromorphone         OPHTHALMIC EXAM: Base Eye Exam    Visual Acuity (ETDRS)      Right Left   Dist [place] 20/25 +2 20/80 -3   Dist ph [place]  20/50       Tonometry (Tonopen, 2:14 PM)      Right Left   Pressure 14 13       Pupils      Pupils Dark Light Shape React APD   Right PERRL 4 3 Round Brisk None   Left PERRL 4 3 Round Brisk None       Visual Fields (Counting fingers)      Left Right    Full Full       Extraocular Movement      Right Left    Full Full       Neuro/Psych    Oriented x3: Yes   Mood/Affect: Normal       Dilation    Left eye: 1.0% Mydriacyl, 2.5% Phenylephrine @ 2:19 PM        Slit Lamp and Fundus Exam    External Exam      Right Left   External Normal Normal       Slit Lamp Exam      Right Left   Lids/Lashes Normal Normal   Conjunctiva/Sclera White and quiet White and quiet   Cornea Clear Clear   Anterior Chamber Deep and quiet Deep and quiet   Iris Round and reactive Round and reactive   Lens Posterior chamber intraocular lens, centered, 1+ Posterior capsular opacification Posterior chamber intraocular lens, centered, 1+ Posterior capsular opacification   Anterior Vitreous Normal Normal       Fundus Exam      Right Left   Posterior Vitreous  Posterior vitreous detachment, Vitreous hemorrhage 2+ central, and settling inferiorly   Disc  Normal   C/D Ratio  0.5   Macula  Microaneurysms, no macular  thickening   Vessels  PDR-active   Periphery  Good PRP, light pigmented scars from prior laser yellow , nearly 360            IMAGING AND PROCEDURES  Imaging and Procedures for 02/15/20  Color Fundus Photography Optos - OU - Both Eyes       Right Eye Progression has been stable. Macula : microaneurysms.   Left Eye Progression has been stable. Disc findings include normal observations.   Notes Dense vitreous hemorrhage covers the macula and settling inferiorly.  Good peripheral PRP nasal superior and temporal.  Will still need PRP inferiorly but no clear openings through the blood inferiorly, OS  OD Severe NPDR with good peripheral PRP, clear media                ASSESSMENT/PLAN:  Vitreous hemorrhage, left (HCC) Nonclearing vitreous hemorrhage left eye will likely need vitrectomy however patient having a scheduled root canal February 10.  She would like to wait before going to surgery for the clearance of the eye.  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  We will have the patient follow-up in 3 weeks for reevaluation and consideration of either laser photocoagulation and/or proceeding with plans for surgical intervention via vitrectomy endolaser photocoagulation      ICD-10-CM   1. 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 Vitreous hemorrhage, left (HCC)  H43.12 Color Fundus Photography Optos - OU - Both Eyes    1.  Nonclearing vitreous hemorrhage left eye, will continue to monitor.  Good PRP peripherally from 270 degrees though inferior treatment is required.  2.  Follow-up 3 weeks for planned PRP inferiorly if hemorrhage is cleared further.  If not  we will begin preparations for surgical intervention via vitrectomy and endolaser left eye  3.  Ophthalmic Meds Ordered this visit:  No orders of the defined types were placed in this encounter.      Return in about 3 weeks (around 03/07/2020) for dilate, OS, PRP.  There are no Patient Instructions on file for this visit.   Explained the diagnoses, plan, and follow up with the patient and they expressed understanding.  Patient expressed understanding of the importance of proper follow up care.   Gary A. Rankin M.D. Diseases & Surgery of the Retina and Vitreous [facility] 02/15/20     Abbreviations: M myopia (nearsighted); A astigmatism; H hyperopia (farsighted); P presbyopia; Mrx spectacle prescription;  CTL contact lenses; OD right eye; OS left eye; OU both eyes  XT exotropia; ET esotropia; PEK punctate epithelial keratitis; PEE punctate epithelial erosions; DES dry eye syndrome; MGD meibomian gland dysfunction; ATs artificial tears; PFAT's preservative free artificial tears; [place] nuclear sclerotic cataract; PSC posterior subcapsular cataract; ERM epi-retinal membrane; PVD posterior vitreous detachment; RD retinal detachment; DM diabetes mellitus; DR diabetic retinopathy; NPDR non-proliferative diabetic retinopathy; PDR proliferative diabetic retinopathy; CSME clinically significant macular edema; DME diabetic macular edema; dbh dot blot hemorrhages; CWS cotton wool spot; POAG primary open angle glaucoma; C/D cup-to-disc ratio; HVF humphrey visual field; GVF goldmann visual field; OCT optical coherence tomography; IOP intraocular pressure; BRVO Branch retinal vein occlusion; CRVO central retinal vein occlusion; CRAO central retinal artery occlusion; BRAO branch retinal artery occlusion; RT retinal tear; SB scleral buckle; PPV pars plana vitrectomy; VH Vitreous hemorrhage; PRP panretinal laser photocoagulation; IVK intravitreal kenalog; VMT vitreomacular traction; MH Macular  hole;  NVD neovascularization of   the disc; NVE neovascularization elsewhere; AREDS age related eye disease study; ARMD age related macular degeneration; POAG primary open angle glaucoma; EBMD epithelial/anterior basement membrane dystrophy; ACIOL anterior chamber intraocular lens; IOL intraocular lens; PCIOL posterior chamber intraocular lens; Phaco/IOL phacoemulsification with intraocular lens placement; [place] photorefractive keratectomy; LASIK laser assisted in situ keratomileusis; HTN hypertension; DM diabetes mellitus; COPD chronic obstructive pulmonary disease

## 2020-03-07 ENCOUNTER — Encounter (INDEPENDENT_AMBULATORY_CARE_PROVIDER_SITE_OTHER): Payer: PPO | Admitting: Ophthalmology

## 2020-03-11 DIAGNOSIS — L821 Other seborrheic keratosis: Secondary | ICD-10-CM | POA: Diagnosis not present

## 2020-03-11 DIAGNOSIS — D224 Melanocytic nevi of scalp and neck: Secondary | ICD-10-CM | POA: Diagnosis not present

## 2020-03-11 DIAGNOSIS — B0089 Other herpesviral infection: Secondary | ICD-10-CM | POA: Diagnosis not present

## 2020-03-11 DIAGNOSIS — L814 Other melanin hyperpigmentation: Secondary | ICD-10-CM | POA: Diagnosis not present

## 2020-03-14 ENCOUNTER — Other Ambulatory Visit: Payer: Self-pay

## 2020-03-14 ENCOUNTER — Ambulatory Visit (INDEPENDENT_AMBULATORY_CARE_PROVIDER_SITE_OTHER): Payer: PPO | Admitting: Ophthalmology

## 2020-03-14 ENCOUNTER — Encounter (INDEPENDENT_AMBULATORY_CARE_PROVIDER_SITE_OTHER): Payer: Self-pay | Admitting: Ophthalmology

## 2020-03-14 DIAGNOSIS — E113512 Type 2 diabetes mellitus with proliferative diabetic retinopathy with macular edema, left eye: Secondary | ICD-10-CM

## 2020-03-14 DIAGNOSIS — H4312 Vitreous hemorrhage, left eye: Secondary | ICD-10-CM | POA: Diagnosis not present

## 2020-03-14 NOTE — Progress Notes (Signed)
03/14/2020     CHIEF COMPLAINT Patient presents for Retina Follow Up (3 Week f\u OS. Possible PRP OS/Pt feels vision may have slightly improved. Pt states vision is less gray than before. Pt sees floaters in vision as well./BGL: 140 last night)   HISTORY OF PRESENT ILLNESS: Rhonda Clayton is a 75 y.o. female who presents to the clinic today for:   HPI    Retina Follow Up    Patient presents with  Diabetic Retinopathy.  In left eye.  Severity is moderate.  Duration of 3 weeks.  Since onset it is stable.  I, the attending physician,  performed the HPI with the patient and updated documentation appropriately. Additional comments: 3 Week f\u OS. Possible PRP OS Pt feels vision may have slightly improved. Pt states vision is less gray than before. Pt sees floaters in vision as well. BGL: 140 last night       Last edited by Tilda Franco on 03/14/2020  1:28 PM. (History)      Referring physician: Prince Solian, MD [place],  [place] [address]  HISTORICAL INFORMATION:   Selected notes from the [facility]    Lab Results  Component Value Date   HGBA1C 8.9 (H) 05/09/2014     CURRENT MEDICATIONS: No current outpatient medications on file. (Ophthalmic Drugs)   No current facility-administered medications for this visit. (Ophthalmic Drugs)   Current Outpatient Medications (Other)  Medication Sig  . amLODipine (NORVASC) 2.5 MG tablet 2 tablets.  Marland Kitchen aspirin EC 81 MG tablet Take 81 mg by mouth daily.  . betamethasone dipropionate (DIPROLENE) 0.05 % cream Apply topically 2 (two) times daily.  . chlorthalidone (HYGROTON) 25 MG tablet Take 25 mg by mouth daily.  . clobetasol ointment (TEMOVATE) 6.37 % Apply 1 application topically at bedtime.  . Insulin Aspart (NOVOLOG [place]) Inject 22 Units into the skin 2 (two) times daily.  Marland Kitchen levothyroxine (SYNTHROID, LEVOTHROID) 112 MCG tablet Take 100 mcg by mouth daily.   Marland Kitchen telmisartan-hydrochlorothiazide (MICARDIS HCT)  80-25 MG tablet Take 1 tablet by mouth daily.   No current facility-administered medications for this visit. (Other)      REVIEW OF SYSTEMS: ROS    Positive for: Endocrine   Last edited by Tilda Franco on 03/14/2020  1:28 PM. (History)       ALLERGIES Allergies  Allergen Reactions  . Coffee Flavor Other (See Comments)    Sick on stomach, sneezing, backed up  . Cephalexin Other (See Comments)    Yeast infection  . Ciprofloxacin Other (See Comments)    tendinitis  . Oatmeal Other (See Comments)    Sneezing and drainage  . Sulfa Antibiotics Other (See Comments)    Yeast infection    PAST MEDICAL HISTORY Past Medical History:  Diagnosis Date  . Anemia   . Cystocele   . DJD (degenerative joint disease)   . Esophageal stricture   . Essential hypertension   . Hyperlipidemia   . Hypothyroidism   . Lichen sclerosus   . Osteopenia   . RBBB with left anterior fascicular block   . Rectocele   . Stress incontinence   . Torn rotator cuff    Bilateral  . Type 2 diabetes mellitus ([place])   . Venous insufficiency    Past Surgical History:  Procedure Laterality Date  . ABDOMINAL EXPOSURE N/A 05/09/2014   Procedure: ABDOMINAL EXPOSURE;  Surgeon: Rosetta Posner, MD;  Location: [facility];  Service: Vascular;  Laterality:  N/A;  . ANTERIOR LAT LUMBAR FUSION Right 05/09/2014   Procedure: Lumbar three-four, Lumbar four-five Anterior lateral lumbar fusion;  Surgeon: Erline Levine, MD;  Location: [facility] NEUR[facility] ORS;  Service: Neurosurgery;  Laterality: Right;  . ANTERIOR LUMBAR FUSION N/A 05/09/2014   Procedure: Stage 1:Lumbar four-five, Lumbar five-Sacral one Anterior lumbar interbody fusion with Dr. Donnetta Hutching for approach ;  Surgeon: Erline Levine, MD;  Location: [facility] NEUR[facility] ORS;  Service: Neurosurgery;  Laterality: N/A;  . CHOLECYSTECTOMY  1987  . INCONTINENCE SURGERY    . LUMBAR PERCUTANEOUS PEDICLE SCREW 3 LEVEL N/A 05/10/2014   Procedure: Stage 2: Percutaneous Pedicle Screws; Laminectomy  L3-4  ;  Surgeon: Erline Levine, MD;  Location: [facility] NEURO ORS;  Service: Neurosurgery;  Laterality: N/A;  Stage 2: Percutaneous Pedicle Screws T10 to Ileum; Laminectomy L3-4    . RECTAL SURGERY    . VAGINA SURGERY  2018   Josph Macho Touch- Vaginal Dryness    FAMILY HISTORY Family History  Problem Relation Age of Onset  . Multiple myeloma Mother   . Cancer Maternal Aunt        Cancer in the mouth - dipped snuff  . Heart disease Brother   . Diabetes Father   . Colon polyps Brother   . Heart disease Maternal Uncle   . Heart disease Paternal Uncle   . Heart disease Paternal Grandfather     SOCIAL HISTORY Social History   Tobacco Use  . Smoking status: Never Smoker  . Smokeless tobacco: Never Used  Vaping Use  . Vaping Use: Never used  Substance Use Topics  . Alcohol use: No  . Drug use: Yes    Types: Hydromorphone         OPHTHALMIC EXAM:  Base Eye Exam    Visual Acuity (Snellen - Linear)      Right Left   Dist [place] 20/20 -1 20/25       Tonometry (Tonopen, 1:31 PM)      Right Left   Pressure 13 14       Neuro/Psych    Oriented x3: Yes   Mood/Affect: Normal       Dilation    Left eye: 1.0% Mydriacyl, 2.5% Phenylephrine @ 1:31 PM        Slit Lamp and Fundus Exam    External Exam      Right Left   External Normal Normal       Slit Lamp Exam      Right Left   Lids/Lashes Normal Normal   Conjunctiva/Sclera White and quiet White and quiet   Cornea Clear Clear   Anterior Chamber Deep and quiet Deep and quiet   Iris Round and reactive Round and reactive   Lens Posterior chamber intraocular lens, centered, 1+ Posterior capsular opacification Posterior chamber intraocular lens, centered, 1+ Posterior capsular opacification   Anterior Vitreous Normal Normal       Fundus Exam      Right Left   Posterior Vitreous  Posterior vitreous detachment, Vitreous hemorrhage 1+ central, and settling inferiorly   Disc  Normal   C/D Ratio  0.5   Macula  Microaneurysms,  no macular thickening   Vessels  PDR-active   Periphery  Good PRP, light pigmented scars from prior laser yellow , nearly 360, room for more laser inferiorly now that hemorrhage has cleared          IMAGING AND PROCEDURES  Imaging and Procedures for 03/14/20  Panretinal Photocoagulation - OS - Left Eye  Time Out Confirmed correct patient, procedure, site, and patient consented.   Anesthesia Topical anesthesia was used. Anesthetic medications included Proparacaine 0.5%.   Laser Information The type of laser was diode. Color was yellow. The duration in seconds was 0.04. The spot size was 390 microns. Laser power was 280. Total spots was 470.   Post-op The patient tolerated the procedure well. There were no complications. The patient received written and verbal post procedure care education.   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 Vitreous hemorrhage, left (HCC)  H43.12     1.  PRP OS today to final regions of retinal periphery left eye to calm the active PDR triggering vitreous hemorrhage recently.  2.  Hemorrhage has been continuing to clear thus no need to intervene on a surgical basis in the left eye.  3.  Ophthalmic Meds Ordered this visit:  No  orders of the defined types were placed in this encounter.      Return in about 6 weeks (around 04/25/2020) for DILATE OU, COLOR FP, OCT.  There are no Patient Instructions on file for this visit.   Explained the diagnoses, plan, and follow up with the patient and they expressed understanding.  Patient expressed understanding of the importance of proper follow up care.   Clent Demark Eldena Dede M.D. Diseases & Surgery of the Retina and Vitreous Retina & Diabetic [place] 03/14/20     Abbreviations: M myopia (nearsighted); A astigmatism; H hyperopia (farsighted); P presbyopia; Mrx spectacle prescription;  CTL contact lenses; OD right eye; OS left eye; OU both eyes  XT exotropia; ET esotropia; PEK punctate epithelial keratitis; PEE punctate epithelial erosions; DES dry eye syndrome; MGD meibomian gland dysfunction; ATs artificial tears; PFAT's preservative free artificial tears; [place] nuclear sclerotic cataract; PSC posterior subcapsular cataract; ERM epi-retinal membrane; PVD posterior vitreous detachment; RD retinal detachment; DM diabetes mellitus; DR diabetic retinopathy; NPDR non-proliferative diabetic retinopathy; PDR proliferative diabetic retinopathy; CSME clinically significant macular edema; DME diabetic macular edema; dbh dot blot hemorrhages; CWS cotton wool spot; POAG primary open angle glaucoma; C/D cup-to-disc ratio; HVF humphrey visual field; GVF goldmann visual field; OCT optical coherence tomography; IOP intraocular pressure; BRVO Branch retinal vein occlusion; CRVO central retinal vein occlusion; CRAO central retinal artery occlusion; BRAO branch retinal artery occlusion; RT retinal tear; SB scleral buckle; PPV pars plana vitrectomy; VH Vitreous hemorrhage; PRP panretinal laser photocoagulation; IVK intravitreal kenalog; VMT vitreomacular traction; MH Macular hole;  NVD neovascularization of the disc; NVE neovascularization elsewhere; AREDS age related eye disease study; ARMD age related  macular degeneration; POAG primary open angle glaucoma; EBMD epithelial/anterior basement membrane dystrophy; ACIOL anterior chamber intraocular lens; IOL intraocular lens; PCIOL posterior chamber intraocular lens; Phaco/IOL phacoemulsification with intraocular lens placement; [place] photorefractive keratectomy; LASIK laser assisted in situ keratomileusis;  HTN hypertension; DM diabetes mellitus; COPD chronic obstructive pulmonary disease

## 2020-03-14 NOTE — Assessment & Plan Note (Signed)
PRP completed inferiorly, temporally and inferonasally

## 2020-03-14 NOTE — Assessment & Plan Note (Signed)
OS, continued clearance by patient's perception also on acuity status post PRP we will continue to monitor respiratory symptoms slowly clears

## 2020-03-20 ENCOUNTER — Other Ambulatory Visit: Payer: Self-pay | Admitting: Internal Medicine

## 2020-03-20 DIAGNOSIS — Z1231 Encounter for screening mammogram for malignant neoplasm of breast: Secondary | ICD-10-CM

## 2020-04-11 DIAGNOSIS — N189 Chronic kidney disease, unspecified: Secondary | ICD-10-CM | POA: Diagnosis not present

## 2020-04-11 DIAGNOSIS — I129 Hypertensive chronic kidney disease with stage 1 through stage 4 chronic kidney disease, or unspecified chronic kidney disease: Secondary | ICD-10-CM | POA: Diagnosis not present

## 2020-04-11 DIAGNOSIS — E039 Hypothyroidism, unspecified: Secondary | ICD-10-CM | POA: Diagnosis not present

## 2020-04-11 DIAGNOSIS — M109 Gout, unspecified: Secondary | ICD-10-CM | POA: Diagnosis not present

## 2020-04-11 DIAGNOSIS — E669 Obesity, unspecified: Secondary | ICD-10-CM | POA: Diagnosis not present

## 2020-04-11 DIAGNOSIS — N2581 Secondary hyperparathyroidism of renal origin: Secondary | ICD-10-CM | POA: Diagnosis not present

## 2020-04-11 DIAGNOSIS — D631 Anemia in chronic kidney disease: Secondary | ICD-10-CM | POA: Diagnosis not present

## 2020-04-11 DIAGNOSIS — E1122 Type 2 diabetes mellitus with diabetic chronic kidney disease: Secondary | ICD-10-CM | POA: Diagnosis not present

## 2020-04-11 DIAGNOSIS — N184 Chronic kidney disease, stage 4 (severe): Secondary | ICD-10-CM | POA: Diagnosis not present

## 2020-04-24 DIAGNOSIS — M5030 Other cervical disc degeneration, unspecified cervical region: Secondary | ICD-10-CM | POA: Diagnosis not present

## 2020-04-24 DIAGNOSIS — M9904 Segmental and somatic dysfunction of sacral region: Secondary | ICD-10-CM | POA: Diagnosis not present

## 2020-04-24 DIAGNOSIS — M9903 Segmental and somatic dysfunction of lumbar region: Secondary | ICD-10-CM | POA: Diagnosis not present

## 2020-04-24 DIAGNOSIS — M5136 Other intervertebral disc degeneration, lumbar region: Secondary | ICD-10-CM | POA: Diagnosis not present

## 2020-04-24 DIAGNOSIS — M9902 Segmental and somatic dysfunction of thoracic region: Secondary | ICD-10-CM | POA: Diagnosis not present

## 2020-04-24 DIAGNOSIS — M9901 Segmental and somatic dysfunction of cervical region: Secondary | ICD-10-CM | POA: Diagnosis not present

## 2020-04-25 ENCOUNTER — Encounter (INDEPENDENT_AMBULATORY_CARE_PROVIDER_SITE_OTHER): Payer: Self-pay | Admitting: Ophthalmology

## 2020-04-25 ENCOUNTER — Other Ambulatory Visit: Payer: Self-pay

## 2020-04-25 ENCOUNTER — Ambulatory Visit (INDEPENDENT_AMBULATORY_CARE_PROVIDER_SITE_OTHER): Payer: PPO | Admitting: Ophthalmology

## 2020-04-25 DIAGNOSIS — H4312 Vitreous hemorrhage, left eye: Secondary | ICD-10-CM

## 2020-04-25 DIAGNOSIS — E113591 Type 2 diabetes mellitus with proliferative diabetic retinopathy without macular edema, right eye: Secondary | ICD-10-CM | POA: Diagnosis not present

## 2020-04-25 DIAGNOSIS — E113512 Type 2 diabetes mellitus with proliferative diabetic retinopathy with macular edema, left eye: Secondary | ICD-10-CM | POA: Diagnosis not present

## 2020-04-25 NOTE — Assessment & Plan Note (Signed)
Good PRP S has calm the PDR and much less vitreous hemorrhage now as compared to January 2022 we will continue to observe may need final PRP superior retina OS

## 2020-04-25 NOTE — Progress Notes (Signed)
04/25/2020     CHIEF COMPLAINT Patient presents for Retina Follow Up (6 Wk F/U OU//Pt reports improving VA OS. Pt denies new symptoms OU. Pt sts iron is low, and she is getting ready to get iron infusions./LBS: 149 last night)   HISTORY OF PRESENT ILLNESS: Rhonda Clayton is a 75 y.o. female who presents to the clinic today for:   HPI    Retina Follow Up    Patient presents with  Diabetic Retinopathy.  In left eye.  This started 6 weeks ago.  Severity is mild.  Duration of 6 weeks.  Since onset it is gradually improving. Additional comments: 6 Wk F/U OU  Pt reports improving VA OS. Pt denies new symptoms OU. Pt sts iron is low, and she is getting ready to get iron infusions. LBS: 149 last night       Last edited by Rockie Neighbours, [place] on 04/25/2020  1:36 PM. (History)      Referring physician: Prince Solian, MD [place],  [place] [address]  HISTORICAL INFORMATION:   Selected notes from the [facility]    Lab Results  Component Value Date   HGBA1C 8.9 (H) 05/09/2014     CURRENT MEDICATIONS: No current outpatient medications on file. (Ophthalmic Drugs)   No current facility-administered medications for this visit. (Ophthalmic Drugs)   Current Outpatient Medications (Other)  Medication Sig  . amLODipine (NORVASC) 2.5 MG tablet 2 tablets.  Marland Kitchen aspirin EC 81 MG tablet Take 81 mg by mouth daily.  . betamethasone dipropionate (DIPROLENE) 0.05 % cream Apply topically 2 (two) times daily.  . chlorthalidone (HYGROTON) 25 MG tablet Take 25 mg by mouth daily.  . clobetasol ointment (TEMOVATE) 0.05 % Apply 1 application topically at bedtime.  . Insulin Aspart (NOVOLOG [place]) Inject 22 Units into the skin 2 (two) times daily.  Marland Kitchen levothyroxine (SYNTHROID, LEVOTHROID) 112 MCG tablet Take 100 mcg by mouth daily.   Marland Kitchen telmisartan-hydrochlorothiazide (MICARDIS HCT) 80-25 MG tablet Take 1 tablet by mouth daily.   No current facility-administered medications for this  visit. (Other)      REVIEW OF SYSTEMS:    ALLERGIES Allergies  Allergen Reactions  . Coffee Flavor Other (See Comments)    Sick on stomach, sneezing, backed up  . Cephalexin Other (See Comments)    Yeast infection  . Ciprofloxacin Other (See Comments)    tendinitis  . Oatmeal Other (See Comments)    Sneezing and drainage  . Sulfa Antibiotics Other (See Comments)    Yeast infection    PAST MEDICAL HISTORY Past Medical History:  Diagnosis Date  . Anemia   . Cystocele   . DJD (degenerative joint disease)   . Esophageal stricture   . Essential hypertension   . Hyperlipidemia   . Hypothyroidism   . Lichen sclerosus   . Osteopenia   . RBBB with left anterior fascicular block   . Rectocele   . Stress incontinence   . Torn rotator cuff    Bilateral  . Type 2 diabetes mellitus ([place])   . Venous insufficiency    Past Surgical History:  Procedure Laterality Date  . ABDOMINAL EXPOSURE N/A 05/09/2014   Procedure: ABDOMINAL EXPOSURE;  Surgeon: Rosetta Posner, MD;  Location: [facility];  Service: Vascular;  Laterality: N/A;  . ANTERIOR LAT LUMBAR FUSION Right 05/09/2014   Procedure: Lumbar three-four, Lumbar four-five Anterior lateral lumbar fusion;  Surgeon: Erline Levine, MD;  Location: [facility] NEURO ORS;  Service: Neurosurgery;  Laterality: Right;  . ANTERIOR LUMBAR FUSION N/A 05/09/2014   Procedure: Stage 1:Lumbar four-five, Lumbar five-Sacral one Anterior lumbar interbody fusion with Dr. Donnetta Hutching for approach ;  Surgeon: Erline Levine, MD;  Location: [facility] NEURO ORS;  Service: Neurosurgery;  Laterality: N/A;  . CHOLECYSTECTOMY  1987  . INCONTINENCE SURGERY    . LUMBAR PERCUTANEOUS PEDICLE SCREW 3 LEVEL N/A 05/10/2014   Procedure: Stage 2: Percutaneous Pedicle Screws; Laminectomy L3-4  ;  Surgeon: Erline Levine, MD;  Location: [facility] NEURO ORS;  Service: Neurosurgery;  Laterality: N/A;  Stage 2: Percutaneous Pedicle Screws T10 to Ileum; Laminectomy L3-4    . RECTAL SURGERY    . VAGINA SURGERY   2018   Josph Macho Touch- Vaginal Dryness    FAMILY HISTORY Family History  Problem Relation Age of Onset  . Multiple myeloma Mother   . Cancer Maternal Aunt        Cancer in the mouth - dipped snuff  . Heart disease Brother   . Diabetes Father   . Colon polyps Brother   . Heart disease Maternal Uncle   . Heart disease Paternal Uncle   . Heart disease Paternal Grandfather     SOCIAL HISTORY Social History   Tobacco Use  . Smoking status: Never Smoker  . Smokeless tobacco: Never Used  Vaping Use  . Vaping Use: Never used  Substance Use Topics  . Alcohol use: No  . Drug use: Yes    Types: Hydromorphone         OPHTHALMIC EXAM: Base Eye Exam    Visual Acuity (ETDRS)      Right Left   Dist [place] 20/20 -1 20/30 -2   Dist ph [place]  20/20 -1       Tonometry (Tonopen, 1:35 PM)      Right Left   Pressure 09 09       Pupils      Pupils Dark Light Shape React APD   Right PERRL 4 3 Round Brisk None   Left PERRL 4 3 Round Brisk None       Visual Fields (Counting fingers)      Left Right    Full Full       Extraocular Movement      Right Left    Full Full       Neuro/Psych    Oriented x3: Yes   Mood/Affect: Normal       Dilation    Both eyes: 1.0% Mydriacyl, 2.5% Phenylephrine @ 1:38 PM        Slit Lamp and Fundus Exam    External Exam      Right Left   External Normal Normal       Slit Lamp Exam      Right Left   Lids/Lashes Normal Normal   Conjunctiva/Sclera White and quiet White and quiet   Cornea Clear Clear   Anterior Chamber Deep and quiet Deep and quiet   Iris Round and reactive Round and reactive   Lens Posterior chamber intraocular lens, centered, 1+ Posterior capsular opacification Posterior chamber intraocular lens, centered, 1+ Posterior capsular opacification   Anterior Vitreous Normal Normal       Fundus Exam      Right Left   Posterior Vitreous Posterior vitreous detachment, Central vitreous floaters Posterior vitreous detachment,  Vitreous hemorrhage 1+ central, and settling inferiorly   Disc Normal Normal   C/D Ratio 0.45 0.5   Macula Normal, no macular thickening Microaneurysms, no macular thickening  Vessels PDR-quiet PDR-active   Periphery Good PRP, light pigmented scars from prior laser yellow  Good PRP, light pigmented scars from prior laser yellow , nearly 360, room for more laser inferiorly now that hemorrhage has cleared          IMAGING AND PROCEDURES  Imaging and Procedures for 04/25/20  Color Fundus Photography Optos - OU - Both Eyes       Right Eye Progression has been stable. Macula : microaneurysms.   Left Eye Progression has been stable. Disc findings include normal observations. Macula : normal observations, microaneurysms. Periphery : hemorrhage.   Notes Dense vitreous hemorrhage covers the macula and settling inferiorly.  Good peripheral PRP nasal superior and temporal.  Will still need PRP inferiorly but no clear openings through the blood inferiorly, OS much less vitreous hemorrhage.  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 Scan locations included subfoveal. Central Foveal Thickness: 247. Progression has been stable. Findings include epiretinal membrane.   Left Eye Quality was good. Central Foveal Thickness: 247. Progression has been stable. Findings include normal foveal contour.   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  Continue to observe for clearing as good PRP in place.      ICD-10-CM   1. 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 Vitreous hemorrhage, left (HCC)  H43.12     1.  PRP OS has improved the PDR activity to the point where it is no longer active and vitreous hemorrhage is clearing left eye.  We will continue to observe.  There is room from further laser treatment in the superior part of the retina left eye but is not required at this time  2.  OD, early PDR call now with good PRP nasal and inferior will continue to observe  3.  Ophthalmic Meds Ordered this visit:  No orders of the defined types were placed in this encounter.      Return in about 3 months (around 07/25/2020) for COLOR FP, DILATE OU.  There are no Patient Instructions on file for this visit.   Explained the diagnoses, plan, and follow up with the patient and they expressed understanding.  Patient expressed understanding of the importance of proper follow up care.   Clent Demark Brayli Klingbeil M.D. Diseases & Surgery of the Retina  and Vitreous Retina & Diabetic [place] 04/25/20     Abbreviations: M myopia (nearsighted); A astigmatism; H hyperopia (farsighted); P presbyopia; Mrx spectacle prescription;  CTL contact lenses; OD right eye; OS left eye; OU both eyes  XT exotropia; ET esotropia; PEK punctate epithelial keratitis; PEE punctate epithelial erosions; DES dry eye syndrome; MGD  meibomian gland dysfunction; ATs artificial tears; PFAT's preservative free artificial tears; [place] nuclear sclerotic cataract; PSC posterior subcapsular cataract; ERM epi-retinal membrane; PVD posterior vitreous detachment; RD retinal detachment; DM diabetes mellitus; DR diabetic retinopathy; NPDR non-proliferative diabetic retinopathy; PDR proliferative diabetic retinopathy; CSME clinically significant macular edema; DME diabetic macular edema; dbh dot blot hemorrhages; CWS cotton wool spot; POAG primary open angle glaucoma; C/D cup-to-disc ratio; HVF humphrey visual field; GVF goldmann visual field; OCT optical coherence tomography; IOP intraocular pressure; BRVO Branch retinal vein occlusion; CRVO central retinal vein occlusion; CRAO central retinal artery occlusion; BRAO branch retinal artery occlusion; RT retinal tear; SB scleral buckle; PPV pars plana vitrectomy; VH Vitreous hemorrhage; PRP panretinal laser photocoagulation; IVK intravitreal kenalog; VMT vitreomacular traction; MH Macular hole;  NVD neovascularization of the disc; NVE neovascularization elsewhere; AREDS age related eye disease study; ARMD age related macular degeneration; POAG primary open angle glaucoma; EBMD epithelial/anterior basement membrane dystrophy; ACIOL anterior chamber intraocular lens; IOL intraocular lens; PCIOL posterior chamber intraocular lens; Phaco/IOL phacoemulsification with intraocular lens placement; [place] photorefractive keratectomy; LASIK laser assisted in situ keratomileusis; HTN hypertension; DM diabetes mellitus; COPD chronic obstructive pulmonary disease

## 2020-04-25 NOTE — Assessment & Plan Note (Signed)
Vitreous hemorrhage left eye continues to clear over the macular region as compared to 3 months prior.  Continue to observe for clearing as good PRP in place.

## 2020-04-25 NOTE — Assessment & Plan Note (Signed)
Moderate scatter PRP noted nasally and inferiorly OD, no active disease will observe

## 2020-04-29 DIAGNOSIS — N184 Chronic kidney disease, stage 4 (severe): Secondary | ICD-10-CM | POA: Diagnosis not present

## 2020-05-03 DIAGNOSIS — M5136 Other intervertebral disc degeneration, lumbar region: Secondary | ICD-10-CM | POA: Diagnosis not present

## 2020-05-03 DIAGNOSIS — M9904 Segmental and somatic dysfunction of sacral region: Secondary | ICD-10-CM | POA: Diagnosis not present

## 2020-05-03 DIAGNOSIS — M5030 Other cervical disc degeneration, unspecified cervical region: Secondary | ICD-10-CM | POA: Diagnosis not present

## 2020-05-03 DIAGNOSIS — M9903 Segmental and somatic dysfunction of lumbar region: Secondary | ICD-10-CM | POA: Diagnosis not present

## 2020-05-03 DIAGNOSIS — M9901 Segmental and somatic dysfunction of cervical region: Secondary | ICD-10-CM | POA: Diagnosis not present

## 2020-05-03 DIAGNOSIS — M9902 Segmental and somatic dysfunction of thoracic region: Secondary | ICD-10-CM | POA: Diagnosis not present

## 2020-05-08 DIAGNOSIS — E039 Hypothyroidism, unspecified: Secondary | ICD-10-CM | POA: Diagnosis not present

## 2020-05-08 DIAGNOSIS — N184 Chronic kidney disease, stage 4 (severe): Secondary | ICD-10-CM | POA: Diagnosis not present

## 2020-05-08 DIAGNOSIS — I129 Hypertensive chronic kidney disease with stage 1 through stage 4 chronic kidney disease, or unspecified chronic kidney disease: Secondary | ICD-10-CM | POA: Diagnosis not present

## 2020-05-08 DIAGNOSIS — E1129 Type 2 diabetes mellitus with other diabetic kidney complication: Secondary | ICD-10-CM | POA: Diagnosis not present

## 2020-05-08 DIAGNOSIS — E785 Hyperlipidemia, unspecified: Secondary | ICD-10-CM | POA: Diagnosis not present

## 2020-05-10 ENCOUNTER — Ambulatory Visit
Admission: RE | Admit: 2020-05-10 | Discharge: 2020-05-10 | Disposition: A | Discharge status: Home / Self Care | Payer: PPO | Source: Ambulatory Visit | Attending: Internal Medicine | Admitting: Internal Medicine

## 2020-05-10 ENCOUNTER — Other Ambulatory Visit: Payer: Self-pay

## 2020-05-10 DIAGNOSIS — Z1231 Encounter for screening mammogram for malignant neoplasm of breast: Secondary | ICD-10-CM

## 2020-05-13 DIAGNOSIS — M9904 Segmental and somatic dysfunction of sacral region: Secondary | ICD-10-CM | POA: Diagnosis not present

## 2020-05-13 DIAGNOSIS — M9903 Segmental and somatic dysfunction of lumbar region: Secondary | ICD-10-CM | POA: Diagnosis not present

## 2020-05-13 DIAGNOSIS — M5136 Other intervertebral disc degeneration, lumbar region: Secondary | ICD-10-CM | POA: Diagnosis not present

## 2020-05-13 DIAGNOSIS — Z1212 Encounter for screening for malignant neoplasm of rectum: Secondary | ICD-10-CM | POA: Diagnosis not present

## 2020-05-13 DIAGNOSIS — M9901 Segmental and somatic dysfunction of cervical region: Secondary | ICD-10-CM | POA: Diagnosis not present

## 2020-05-13 DIAGNOSIS — M9902 Segmental and somatic dysfunction of thoracic region: Secondary | ICD-10-CM | POA: Diagnosis not present

## 2020-05-13 DIAGNOSIS — M5030 Other cervical disc degeneration, unspecified cervical region: Secondary | ICD-10-CM | POA: Diagnosis not present

## 2020-05-15 DIAGNOSIS — I129 Hypertensive chronic kidney disease with stage 1 through stage 4 chronic kidney disease, or unspecified chronic kidney disease: Secondary | ICD-10-CM | POA: Diagnosis not present

## 2020-05-15 DIAGNOSIS — Z0000 Encounter for general adult medical examination without abnormal findings: Secondary | ICD-10-CM | POA: Diagnosis not present

## 2020-05-15 DIAGNOSIS — D692 Other nonthrombocytopenic purpura: Secondary | ICD-10-CM | POA: Diagnosis not present

## 2020-05-15 DIAGNOSIS — E1129 Type 2 diabetes mellitus with other diabetic kidney complication: Secondary | ICD-10-CM | POA: Diagnosis not present

## 2020-05-15 DIAGNOSIS — H35069 Retinal vasculitis, unspecified eye: Secondary | ICD-10-CM | POA: Diagnosis not present

## 2020-05-15 DIAGNOSIS — M8580 Other specified disorders of bone density and structure, unspecified site: Secondary | ICD-10-CM | POA: Diagnosis not present

## 2020-05-15 DIAGNOSIS — E785 Hyperlipidemia, unspecified: Secondary | ICD-10-CM | POA: Diagnosis not present

## 2020-05-15 DIAGNOSIS — N184 Chronic kidney disease, stage 4 (severe): Secondary | ICD-10-CM | POA: Diagnosis not present

## 2020-05-15 DIAGNOSIS — E669 Obesity, unspecified: Secondary | ICD-10-CM | POA: Diagnosis not present

## 2020-05-15 DIAGNOSIS — E039 Hypothyroidism, unspecified: Secondary | ICD-10-CM | POA: Diagnosis not present

## 2020-05-20 DIAGNOSIS — N952 Postmenopausal atrophic vaginitis: Secondary | ICD-10-CM | POA: Diagnosis not present

## 2020-05-20 DIAGNOSIS — L900 Lichen sclerosus et atrophicus: Secondary | ICD-10-CM | POA: Diagnosis not present

## 2020-05-20 DIAGNOSIS — Z01419 Encounter for gynecological examination (general) (routine) without abnormal findings: Secondary | ICD-10-CM | POA: Diagnosis not present

## 2020-05-20 DIAGNOSIS — Z6837 Body mass index (BMI) 37.0-37.9, adult: Secondary | ICD-10-CM | POA: Diagnosis not present

## 2020-05-24 DIAGNOSIS — M9903 Segmental and somatic dysfunction of lumbar region: Secondary | ICD-10-CM | POA: Diagnosis not present

## 2020-05-24 DIAGNOSIS — M9904 Segmental and somatic dysfunction of sacral region: Secondary | ICD-10-CM | POA: Diagnosis not present

## 2020-05-24 DIAGNOSIS — M9902 Segmental and somatic dysfunction of thoracic region: Secondary | ICD-10-CM | POA: Diagnosis not present

## 2020-05-24 DIAGNOSIS — M5030 Other cervical disc degeneration, unspecified cervical region: Secondary | ICD-10-CM | POA: Diagnosis not present

## 2020-05-24 DIAGNOSIS — M5136 Other intervertebral disc degeneration, lumbar region: Secondary | ICD-10-CM | POA: Diagnosis not present

## 2020-05-24 DIAGNOSIS — M9901 Segmental and somatic dysfunction of cervical region: Secondary | ICD-10-CM | POA: Diagnosis not present

## 2020-06-06 DIAGNOSIS — D509 Iron deficiency anemia, unspecified: Secondary | ICD-10-CM | POA: Diagnosis not present

## 2020-06-06 DIAGNOSIS — D631 Anemia in chronic kidney disease: Secondary | ICD-10-CM | POA: Diagnosis not present

## 2020-06-06 DIAGNOSIS — N184 Chronic kidney disease, stage 4 (severe): Secondary | ICD-10-CM | POA: Diagnosis not present

## 2020-06-10 DIAGNOSIS — M5030 Other cervical disc degeneration, unspecified cervical region: Secondary | ICD-10-CM | POA: Diagnosis not present

## 2020-06-10 DIAGNOSIS — M9901 Segmental and somatic dysfunction of cervical region: Secondary | ICD-10-CM | POA: Diagnosis not present

## 2020-06-10 DIAGNOSIS — M5136 Other intervertebral disc degeneration, lumbar region: Secondary | ICD-10-CM | POA: Diagnosis not present

## 2020-06-10 DIAGNOSIS — M9902 Segmental and somatic dysfunction of thoracic region: Secondary | ICD-10-CM | POA: Diagnosis not present

## 2020-06-10 DIAGNOSIS — M9904 Segmental and somatic dysfunction of sacral region: Secondary | ICD-10-CM | POA: Diagnosis not present

## 2020-06-10 DIAGNOSIS — M9903 Segmental and somatic dysfunction of lumbar region: Secondary | ICD-10-CM | POA: Diagnosis not present

## 2020-06-21 DIAGNOSIS — M5136 Other intervertebral disc degeneration, lumbar region: Secondary | ICD-10-CM | POA: Diagnosis not present

## 2020-06-21 DIAGNOSIS — M5030 Other cervical disc degeneration, unspecified cervical region: Secondary | ICD-10-CM | POA: Diagnosis not present

## 2020-06-21 DIAGNOSIS — M9901 Segmental and somatic dysfunction of cervical region: Secondary | ICD-10-CM | POA: Diagnosis not present

## 2020-06-21 DIAGNOSIS — M9902 Segmental and somatic dysfunction of thoracic region: Secondary | ICD-10-CM | POA: Diagnosis not present

## 2020-06-21 DIAGNOSIS — M9904 Segmental and somatic dysfunction of sacral region: Secondary | ICD-10-CM | POA: Diagnosis not present

## 2020-06-21 DIAGNOSIS — M9903 Segmental and somatic dysfunction of lumbar region: Secondary | ICD-10-CM | POA: Diagnosis not present

## 2020-07-02 DIAGNOSIS — M9903 Segmental and somatic dysfunction of lumbar region: Secondary | ICD-10-CM | POA: Diagnosis not present

## 2020-07-02 DIAGNOSIS — M9901 Segmental and somatic dysfunction of cervical region: Secondary | ICD-10-CM | POA: Diagnosis not present

## 2020-07-02 DIAGNOSIS — M5136 Other intervertebral disc degeneration, lumbar region: Secondary | ICD-10-CM | POA: Diagnosis not present

## 2020-07-02 DIAGNOSIS — M9902 Segmental and somatic dysfunction of thoracic region: Secondary | ICD-10-CM | POA: Diagnosis not present

## 2020-07-02 DIAGNOSIS — M9904 Segmental and somatic dysfunction of sacral region: Secondary | ICD-10-CM | POA: Diagnosis not present

## 2020-07-02 DIAGNOSIS — M5030 Other cervical disc degeneration, unspecified cervical region: Secondary | ICD-10-CM | POA: Diagnosis not present

## 2020-07-11 ENCOUNTER — Encounter (INDEPENDENT_AMBULATORY_CARE_PROVIDER_SITE_OTHER): Payer: PPO | Admitting: Ophthalmology

## 2020-07-19 DIAGNOSIS — R011 Cardiac murmur, unspecified: Secondary | ICD-10-CM | POA: Diagnosis not present

## 2020-07-19 DIAGNOSIS — H6122 Impacted cerumen, left ear: Secondary | ICD-10-CM | POA: Diagnosis not present

## 2020-07-19 DIAGNOSIS — E1129 Type 2 diabetes mellitus with other diabetic kidney complication: Secondary | ICD-10-CM | POA: Diagnosis not present

## 2020-07-19 DIAGNOSIS — D649 Anemia, unspecified: Secondary | ICD-10-CM | POA: Diagnosis not present

## 2020-07-19 DIAGNOSIS — I129 Hypertensive chronic kidney disease with stage 1 through stage 4 chronic kidney disease, or unspecified chronic kidney disease: Secondary | ICD-10-CM | POA: Diagnosis not present

## 2020-07-19 DIAGNOSIS — N184 Chronic kidney disease, stage 4 (severe): Secondary | ICD-10-CM | POA: Diagnosis not present

## 2020-07-19 DIAGNOSIS — R42 Dizziness and giddiness: Secondary | ICD-10-CM | POA: Diagnosis not present

## 2020-07-25 ENCOUNTER — Encounter (INDEPENDENT_AMBULATORY_CARE_PROVIDER_SITE_OTHER): Payer: PPO | Admitting: Ophthalmology

## 2020-07-25 ENCOUNTER — Other Ambulatory Visit: Payer: Self-pay

## 2020-07-25 ENCOUNTER — Ambulatory Visit (INDEPENDENT_AMBULATORY_CARE_PROVIDER_SITE_OTHER): Payer: PPO | Admitting: Ophthalmology

## 2020-07-25 ENCOUNTER — Encounter (INDEPENDENT_AMBULATORY_CARE_PROVIDER_SITE_OTHER): Payer: Self-pay | Admitting: Ophthalmology

## 2020-07-25 DIAGNOSIS — E113512 Type 2 diabetes mellitus with proliferative diabetic retinopathy with macular edema, left eye: Secondary | ICD-10-CM

## 2020-07-25 DIAGNOSIS — E113591 Type 2 diabetes mellitus with proliferative diabetic retinopathy without macular edema, right eye: Secondary | ICD-10-CM | POA: Diagnosis not present

## 2020-07-25 DIAGNOSIS — H4312 Vitreous hemorrhage, left eye: Secondary | ICD-10-CM | POA: Diagnosis not present

## 2020-07-25 NOTE — Progress Notes (Signed)
07/25/2020     CHIEF COMPLAINT Patient presents for Retina Follow Up (3 month fu ou and fp/Pt states VA OU stable since last visit. Pt denies FOL, floaters, or ocular pain OU. /"I still do not feel like my OS has cleared up as much as I would like it to."/A1C:6.5/LBS: 110)   HISTORY OF PRESENT ILLNESS: Rhonda Clayton is a 75 y.o. female who presents to the clinic today for:   HPI     Retina Follow Up           Diagnosis: Diabetic Retinopathy   Laterality: both eyes   Onset: 3 months ago   Severity: mild   Duration: 3 months   Course: stable   Comments: 3 month fu ou and fp Pt states VA OU stable since last visit. Pt denies FOL, floaters, or ocular pain OU.  "I still do not feel like my OS has cleared up as much as I would like it to." A1C:6.5 LBS: 110       Last edited by Kendra Opitz, COA on 07/25/2020  1:05 PM.      Referring physician: Prince Solian, MD [place],  [place] [address]  HISTORICAL INFORMATION:   Selected notes from the [facility]    Lab Results  Component Value Date   HGBA1C 8.9 (H) 05/09/2014     CURRENT MEDICATIONS: No current outpatient medications on file. (Ophthalmic Drugs)   No current facility-administered medications for this visit. (Ophthalmic Drugs)   Current Outpatient Medications (Other)  Medication Sig   amLODipine (NORVASC) 2.5 MG tablet 2 tablets.   aspirin EC 81 MG tablet Take 81 mg by mouth daily.   betamethasone dipropionate (DIPROLENE) 0.05 % cream Apply topically 2 (two) times daily.   chlorthalidone (HYGROTON) 25 MG tablet Take 25 mg by mouth daily.   clobetasol ointment (TEMOVATE) 9.56 % Apply 1 application topically at bedtime.   Insulin Aspart (NOVOLOG [place]) Inject 22 Units into the skin 2 (two) times daily.   levothyroxine (SYNTHROID, LEVOTHROID) 112 MCG tablet Take 100 mcg by mouth daily.    telmisartan-hydrochlorothiazide (MICARDIS HCT) 80-25 MG tablet Take 1 tablet by mouth daily.   No  current facility-administered medications for this visit. (Other)      REVIEW OF SYSTEMS:    ALLERGIES Allergies  Allergen Reactions   Coffee Flavor Other (See Comments)    Sick on stomach, sneezing, backed up   Cephalexin Other (See Comments)    Yeast infection   Ciprofloxacin Other (See Comments)    tendinitis   Oatmeal Other (See Comments)    Sneezing and drainage   Sulfa Antibiotics Other (See Comments)    Yeast infection    PAST MEDICAL HISTORY Past Medical History:  Diagnosis Date   Anemia    Cystocele    DJD (degenerative joint disease)    Esophageal stricture    Essential hypertension    Hyperlipidemia    Hypothyroidism    Lichen sclerosus    Osteopenia    RBBB with left anterior fascicular block    Rectocele    Stress incontinence    Torn rotator cuff    Bilateral   Type 2 diabetes mellitus ([place])    Venous insufficiency    Past Surgical History:  Procedure Laterality Date   ABDOMINAL EXPOSURE N/A 05/09/2014   Procedure: ABDOMINAL EXPOSURE;  Surgeon: Rosetta Posner, MD;  Location: [facility];  Service: Vascular;  Laterality: N/A;   ANTERIOR LAT LUMBAR FUSION  Right 05/09/2014   Procedure: Lumbar three-four, Lumbar four-five Anterior lateral lumbar fusion;  Surgeon: Erline Levine, MD;  Location: [facility];  Service: Neurosurgery;  Laterality: Right;   ANTERIOR LUMBAR FUSION N/A 05/09/2014   Procedure: Stage 1:Lumbar four-five, Lumbar five-Sacral one Anterior lumbar interbody fusion with Dr. Donnetta Hutching for approach ;  Surgeon: Erline Levine, MD;  Location: [facility];  Service: Neurosurgery;  Laterality: N/A;   CHOLECYSTECTOMY  1987   INCONTINENCE SURGERY     LUMBAR PERCUTANEOUS PEDICLE SCREW 3 LEVEL N/A 05/10/2014   Procedure: Stage 2: Percutaneous Pedicle Screws; Laminectomy L3-4  ;  Surgeon: Erline Levine, MD;  Location: [facility];  Service: Neurosurgery;  Laterality: N/A;  Stage 2: Percutaneous Pedicle Screws T10 to Ileum; Laminectomy L3-4     RECTAL  SURGERY     VAGINA SURGERY  2018   Josph Macho Touch- Vaginal Dryness    FAMILY HISTORY Family History  Problem Relation Age of Onset   Multiple myeloma Mother    Cancer Maternal Aunt        Cancer in the mouth - dipped snuff   Heart disease Brother    Diabetes Father    Colon polyps Brother    Heart disease Maternal Uncle    Heart disease Paternal Uncle    Heart disease Paternal Grandfather     SOCIAL HISTORY Social History   Tobacco Use   Smoking status: Never   Smokeless tobacco: Never  Vaping Use   Vaping Use: Never used  Substance Use Topics   Alcohol use: No   Drug use: Yes    Types: Hydromorphone         OPHTHALMIC EXAM:  Base Eye Exam     Visual Acuity (ETDRS)       Right Left   Dist [place] 20/25 +2 20/40   Dist ph   20/20 -2         Tonometry (Tonopen, 1:08 PM)       Right Left   Pressure 12 14         Pupils       Pupils Dark Light Shape React APD   Right PERRL 4 3 Round Brisk None   Left PERRL 4 3 Round Brisk None         Visual Fields (Counting fingers)       Left Right    Full Full         Extraocular Movement       Right Left    Full Full         Neuro/Psych     Oriented x3: Yes   Mood/Affect: Normal         Dilation     Both eyes: 1.0% Mydriacyl, 2.5% Phenylephrine @ 1:08 PM           Slit Lamp and Fundus Exam     External Exam       Right Left   External Normal Normal         Slit Lamp Exam       Right Left   Lids/Lashes Normal Normal   Conjunctiva/Sclera White and quiet White and quiet   Cornea Clear Clear   Anterior Chamber Deep and quiet Deep and quiet   Iris Round and reactive Round and reactive   Lens Posterior chamber intraocular lens, centered, 1+ Posterior capsular opacification Posterior chamber intraocular lens, centered, 1+ Posterior capsular opacification   Anterior Vitreous Normal Normal  Fundus Exam       Right Left   Posterior Vitreous Posterior vitreous  detachment, Central vitreous floaters Posterior vitreous detachment, Vitreous hemorrhage 1+ central, and settling inferiorly   Disc Normal Normal   C/D Ratio 0.45 0.5   Macula Normal, no macular thickening Microaneurysms, no macular thickening   Vessels PDR-quiet PDR-active   Periphery Good PRP, light pigmented scars from prior laser yellow  Good PRP, light pigmented scars from prior laser yellow , nearly 360, room for more laser inferiorly now that hemorrhage has cleared            IMAGING AND PROCEDURES  Imaging and Procedures for 07/25/20  Color Fundus Photography Optos - OU - Both Eyes       Right Eye Progression has been stable. Macula : microaneurysms.   Left Eye Progression has been stable. Disc findings include normal observations. Macula : normal observations, microaneurysms. Periphery : hemorrhage.   Notes Moderately dense vitreous hemorrhage covers the macula and settling inferiorly.  Good peripheral PRP nasal superior and temporal.   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  No apparent worsening.  If this does worsen may need to entertain vitrectomy to remove the cloudy vitreous and improve the acuity  Vitreous hemorrhage, left (HCC) Mild vitreous hemorrhage settling inferiorly still with some of the visual axis blurring vision  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) Good PRP OD, clear media no vitreous hemorrhage in the right eye.     ICD-10-CM   1. Proliferative diabetic retinopathy of right eye without macular edema associated with type 2 diabetes mellitus (HCC)  T01.6010 Color Fundus Photography Optos - OU - Both Eyes    2. Proliferative  diabetic retinopathy of left eye with macular edema associated with type 2 diabetes mellitus (HCC)  X32.3557 Color Fundus Photography Optos - OU - Both Eyes    3. Vitreous hemorrhage, left (HCC)  H43.12       1.  Moderately dense vitreous hemorrhage left eye continues to slowly clear.  No obvious NVE progression with good PRP now 360 OS  2.  Also OD with clear media, will monitor and observe no signs of progression of PDR will need treatment temporally at some point right eye  3.  Ophthalmic Meds Ordered this visit:  No orders of the defined types were placed in this encounter.      Return in about 3 months (around 10/25/2020) for DILATE OU, COLOR FP, OCT.  There are no Patient Instructions on file for this visit.   Explained the diagnoses, plan, and follow up with the patient and they expressed understanding.  Patient expressed understanding of the importance of proper follow up care.   Clent Demark Dimitria Ketchum M.D. Diseases & Surgery of the Retina and Vitreous Retina & Diabetic [facility] 07/25/20     Abbreviations: M myopia (nearsighted); A astigmatism; H hyperopia (farsighted); P presbyopia; Mrx spectacle prescription;  CTL contact lenses; OD right eye; OS left eye; OU both eyes  XT exotropia; ET esotropia; PEK punctate epithelial keratitis; PEE punctate epithelial erosions; DES dry eye syndrome; MGD meibomian gland dysfunction; ATs artificial tears; PFAT's preservative free artificial tears; [place] nuclear sclerotic cataract;  PSC posterior subcapsular cataract; ERM epi-retinal membrane; PVD posterior vitreous detachment; RD retinal detachment; DM diabetes mellitus; DR diabetic retinopathy; NPDR non-proliferative diabetic retinopathy; PDR proliferative diabetic retinopathy; CSME clinically significant macular edema; DME diabetic macular edema; dbh dot blot hemorrhages; CWS cotton wool spot; POAG primary open angle glaucoma; C/D cup-to-disc ratio; HVF humphrey visual field; GVF goldmann visual  field; OCT optical coherence tomography; IOP intraocular pressure; BRVO Branch retinal vein occlusion; CRVO central retinal vein occlusion; CRAO central retinal artery occlusion; BRAO branch retinal artery occlusion; RT retinal tear; SB scleral buckle; PPV pars plana vitrectomy; VH Vitreous hemorrhage; PRP panretinal laser photocoagulation; IVK intravitreal kenalog; VMT vitreomacular traction; MH Macular hole;  NVD neovascularization of the disc; NVE neovascularization elsewhere; AREDS age related eye disease study; ARMD age related macular degeneration; POAG primary open angle glaucoma; EBMD epithelial/anterior basement membrane dystrophy; ACIOL anterior chamber intraocular lens; IOL intraocular lens; PCIOL posterior chamber intraocular lens; Phaco/IOL phacoemulsification with intraocular lens placement; [place] photorefractive keratectomy; LASIK laser assisted in situ keratomileusis; HTN hypertension; DM diabetes mellitus; COPD chronic obstructive pulmonary disease

## 2020-07-25 NOTE — Assessment & Plan Note (Signed)
Good PRP OD, clear media no vitreous hemorrhage in the right eye.

## 2020-07-25 NOTE — Assessment & Plan Note (Signed)
Mild vitreous hemorrhage settling inferiorly still with some of the visual axis blurring vision

## 2020-07-25 NOTE — Assessment & Plan Note (Signed)
Overall stable left eye, with good PRP peripherally yet still hemorrhage settling inferiorly.  No apparent worsening.  If this does worsen may need to entertain vitrectomy to remove the cloudy vitreous and improve the acuity

## 2020-07-26 ENCOUNTER — Other Ambulatory Visit (HOSPITAL_COMMUNITY): Payer: Self-pay | Admitting: Family Medicine

## 2020-07-26 DIAGNOSIS — R011 Cardiac murmur, unspecified: Secondary | ICD-10-CM

## 2020-08-07 ENCOUNTER — Ambulatory Visit (HOSPITAL_COMMUNITY): Payer: PPO

## 2020-08-28 ENCOUNTER — Ambulatory Visit (HOSPITAL_COMMUNITY): Payer: PPO | Attending: Internal Medicine

## 2020-08-28 ENCOUNTER — Other Ambulatory Visit: Payer: Self-pay

## 2020-08-28 DIAGNOSIS — R011 Cardiac murmur, unspecified: Secondary | ICD-10-CM | POA: Insufficient documentation

## 2020-08-28 LAB — ECHOCARDIOGRAM COMPLETE
AR max vel: 1.92 cm2
AV Area VTI: 2 cm2
AV Area mean vel: 1.87 cm2
AV Mean grad: 7 mmHg
AV Peak grad: 12.6 mmHg
Ao pk vel: 1.78 m/s
Area-P 1/2: 2.05 cm2
S' Lateral: 3.1 cm

## 2020-09-02 DIAGNOSIS — N184 Chronic kidney disease, stage 4 (severe): Secondary | ICD-10-CM | POA: Diagnosis not present

## 2020-09-02 DIAGNOSIS — M109 Gout, unspecified: Secondary | ICD-10-CM | POA: Diagnosis not present

## 2020-09-02 DIAGNOSIS — N2581 Secondary hyperparathyroidism of renal origin: Secondary | ICD-10-CM | POA: Diagnosis not present

## 2020-09-02 DIAGNOSIS — E039 Hypothyroidism, unspecified: Secondary | ICD-10-CM | POA: Diagnosis not present

## 2020-09-02 DIAGNOSIS — N189 Chronic kidney disease, unspecified: Secondary | ICD-10-CM | POA: Diagnosis not present

## 2020-09-02 DIAGNOSIS — E1122 Type 2 diabetes mellitus with diabetic chronic kidney disease: Secondary | ICD-10-CM | POA: Diagnosis not present

## 2020-09-02 DIAGNOSIS — D631 Anemia in chronic kidney disease: Secondary | ICD-10-CM | POA: Diagnosis not present

## 2020-09-02 DIAGNOSIS — E871 Hypo-osmolality and hyponatremia: Secondary | ICD-10-CM | POA: Diagnosis not present

## 2020-09-02 DIAGNOSIS — I129 Hypertensive chronic kidney disease with stage 1 through stage 4 chronic kidney disease, or unspecified chronic kidney disease: Secondary | ICD-10-CM | POA: Diagnosis not present

## 2020-09-02 DIAGNOSIS — E669 Obesity, unspecified: Secondary | ICD-10-CM | POA: Diagnosis not present

## 2020-09-10 ENCOUNTER — Other Ambulatory Visit (HOSPITAL_COMMUNITY): Payer: Self-pay | Admitting: *Deleted

## 2020-09-11 ENCOUNTER — Encounter (HOSPITAL_COMMUNITY): Payer: Self-pay

## 2020-09-11 ENCOUNTER — Inpatient Hospital Stay (HOSPITAL_COMMUNITY): Admission: RE | Admit: 2020-09-11 | Payer: PPO | Source: Ambulatory Visit

## 2020-09-11 DIAGNOSIS — E113553 Type 2 diabetes mellitus with stable proliferative diabetic retinopathy, bilateral: Secondary | ICD-10-CM | POA: Diagnosis not present

## 2020-09-11 DIAGNOSIS — Z961 Presence of intraocular lens: Secondary | ICD-10-CM | POA: Diagnosis not present

## 2020-09-18 ENCOUNTER — Encounter (HOSPITAL_COMMUNITY): Payer: PPO

## 2020-09-25 DIAGNOSIS — E039 Hypothyroidism, unspecified: Secondary | ICD-10-CM | POA: Diagnosis not present

## 2020-09-25 DIAGNOSIS — R42 Dizziness and giddiness: Secondary | ICD-10-CM | POA: Diagnosis not present

## 2020-09-25 DIAGNOSIS — E1129 Type 2 diabetes mellitus with other diabetic kidney complication: Secondary | ICD-10-CM | POA: Diagnosis not present

## 2020-09-25 DIAGNOSIS — E669 Obesity, unspecified: Secondary | ICD-10-CM | POA: Diagnosis not present

## 2020-09-25 DIAGNOSIS — R011 Cardiac murmur, unspecified: Secondary | ICD-10-CM | POA: Diagnosis not present

## 2020-09-25 DIAGNOSIS — I129 Hypertensive chronic kidney disease with stage 1 through stage 4 chronic kidney disease, or unspecified chronic kidney disease: Secondary | ICD-10-CM | POA: Diagnosis not present

## 2020-09-25 DIAGNOSIS — H35069 Retinal vasculitis, unspecified eye: Secondary | ICD-10-CM | POA: Diagnosis not present

## 2020-09-25 DIAGNOSIS — E785 Hyperlipidemia, unspecified: Secondary | ICD-10-CM | POA: Diagnosis not present

## 2020-09-25 DIAGNOSIS — N184 Chronic kidney disease, stage 4 (severe): Secondary | ICD-10-CM | POA: Diagnosis not present

## 2020-09-25 DIAGNOSIS — Z6836 Body mass index (BMI) 36.0-36.9, adult: Secondary | ICD-10-CM | POA: Diagnosis not present

## 2020-09-25 DIAGNOSIS — D692 Other nonthrombocytopenic purpura: Secondary | ICD-10-CM | POA: Diagnosis not present

## 2020-10-01 DIAGNOSIS — D509 Iron deficiency anemia, unspecified: Secondary | ICD-10-CM | POA: Diagnosis not present

## 2020-10-01 DIAGNOSIS — N184 Chronic kidney disease, stage 4 (severe): Secondary | ICD-10-CM | POA: Diagnosis not present

## 2020-10-10 DIAGNOSIS — E871 Hypo-osmolality and hyponatremia: Secondary | ICD-10-CM | POA: Diagnosis not present

## 2020-10-10 DIAGNOSIS — N184 Chronic kidney disease, stage 4 (severe): Secondary | ICD-10-CM | POA: Diagnosis not present

## 2020-10-24 ENCOUNTER — Other Ambulatory Visit (HOSPITAL_COMMUNITY): Payer: Self-pay | Admitting: *Deleted

## 2020-10-25 ENCOUNTER — Encounter (HOSPITAL_COMMUNITY)
Admission: RE | Admit: 2020-10-25 | Discharge: 2020-10-25 | Disposition: A | Discharge status: Home / Self Care | Payer: PPO | Source: Ambulatory Visit | Attending: Nephrology | Admitting: Nephrology

## 2020-10-25 DIAGNOSIS — D631 Anemia in chronic kidney disease: Secondary | ICD-10-CM | POA: Insufficient documentation

## 2020-10-25 MED ORDER — SODIUM CHLORIDE 0.9 % IV SOLN
200.0000 mg | Freq: Once | INTRAVENOUS | Status: AC
  Administered 2020-10-25: 200 mg via INTRAVENOUS
  Filled 2020-10-25: qty 10

## 2020-11-04 ENCOUNTER — Encounter (INDEPENDENT_AMBULATORY_CARE_PROVIDER_SITE_OTHER): Payer: PPO | Admitting: Ophthalmology

## 2020-12-26 DIAGNOSIS — D631 Anemia in chronic kidney disease: Secondary | ICD-10-CM | POA: Diagnosis not present

## 2020-12-26 DIAGNOSIS — N184 Chronic kidney disease, stage 4 (severe): Secondary | ICD-10-CM | POA: Diagnosis not present

## 2020-12-26 DIAGNOSIS — E669 Obesity, unspecified: Secondary | ICD-10-CM | POA: Diagnosis not present

## 2020-12-26 DIAGNOSIS — E1122 Type 2 diabetes mellitus with diabetic chronic kidney disease: Secondary | ICD-10-CM | POA: Diagnosis not present

## 2020-12-26 DIAGNOSIS — E785 Hyperlipidemia, unspecified: Secondary | ICD-10-CM | POA: Diagnosis not present

## 2020-12-26 DIAGNOSIS — N189 Chronic kidney disease, unspecified: Secondary | ICD-10-CM | POA: Diagnosis not present

## 2020-12-26 DIAGNOSIS — N2581 Secondary hyperparathyroidism of renal origin: Secondary | ICD-10-CM | POA: Diagnosis not present

## 2020-12-26 DIAGNOSIS — I129 Hypertensive chronic kidney disease with stage 1 through stage 4 chronic kidney disease, or unspecified chronic kidney disease: Secondary | ICD-10-CM | POA: Diagnosis not present

## 2020-12-26 DIAGNOSIS — E039 Hypothyroidism, unspecified: Secondary | ICD-10-CM | POA: Diagnosis not present

## 2020-12-27 DIAGNOSIS — N184 Chronic kidney disease, stage 4 (severe): Secondary | ICD-10-CM | POA: Diagnosis not present

## 2020-12-30 ENCOUNTER — Ambulatory Visit (INDEPENDENT_AMBULATORY_CARE_PROVIDER_SITE_OTHER): Payer: PPO | Admitting: Ophthalmology

## 2020-12-30 ENCOUNTER — Other Ambulatory Visit: Payer: Self-pay

## 2020-12-30 ENCOUNTER — Encounter (INDEPENDENT_AMBULATORY_CARE_PROVIDER_SITE_OTHER): Payer: Self-pay | Admitting: Ophthalmology

## 2020-12-30 DIAGNOSIS — E113512 Type 2 diabetes mellitus with proliferative diabetic retinopathy with macular edema, left eye: Secondary | ICD-10-CM | POA: Diagnosis not present

## 2020-12-30 DIAGNOSIS — H35371 Puckering of macula, right eye: Secondary | ICD-10-CM | POA: Diagnosis not present

## 2020-12-30 DIAGNOSIS — E113591 Type 2 diabetes mellitus with proliferative diabetic retinopathy without macular edema, right eye: Secondary | ICD-10-CM | POA: Diagnosis not present

## 2020-12-30 DIAGNOSIS — E113552 Type 2 diabetes mellitus with stable proliferative diabetic retinopathy, left eye: Secondary | ICD-10-CM | POA: Diagnosis not present

## 2020-12-30 DIAGNOSIS — H4312 Vitreous hemorrhage, left eye: Secondary | ICD-10-CM | POA: Diagnosis not present

## 2020-12-30 NOTE — Progress Notes (Signed)
12/30/2020     CHIEF COMPLAINT Patient presents for  Chief Complaint  Patient presents with   Retina Follow Up    3 month fu ou and fp Pt states VA OU stable since last visit. Pt denies FOL, floaters, or ocular pain OU.  "I still do not feel like my OS has cleared up as much as I would like it to." A1C:6.5 LBS: 110      HISTORY OF PRESENT ILLNESS: Rhonda Clayton is a 75 y.o. female who presents to the clinic today for:   HPI     Retina Follow Up           Diagnosis: Diabetic Retinopathy   Laterality: both eyes   Onset: 3 months ago   Severity: mild   Duration: 3 months   Course: stable   Comments: 3 month fu ou and fp Pt states VA OU stable since last visit. Pt denies FOL, floaters, or ocular pain OU.  "I still do not feel like my OS has cleared up as much as I would like it to." A1C:6.5 LBS: 110         Comments   3 mos fu OU oct fp. Patient states vision is stable and unchanged since last visit. Denies any new floaters or FOL.       Last edited by Laurin Coder on 12/30/2020  1:37 PM.      Referring physician: Debbra Riding, MD [address] [place],  [place] [address]  HISTORICAL INFORMATION:   Selected notes from the MEDICAL RECORD NUMBER    Lab Results  Component Value Date   HGBA1C 8.9 (H) 05/09/2014     CURRENT MEDICATIONS: No current outpatient medications on file. (Ophthalmic Drugs)   No current facility-administered medications for this visit. (Ophthalmic Drugs)   Current Outpatient Medications (Other)  Medication Sig   amLODipine (NORVASC) 2.5 MG tablet 2 tablets.   aspirin EC 81 MG tablet Take 81 mg by mouth daily.   betamethasone dipropionate (DIPROLENE) 0.05 % cream Apply topically 2 (two) times daily.   chlorthalidone (HYGROTON) 25 MG tablet Take 25 mg by mouth daily.   clobetasol ointment (TEMOVATE) 4.23 % Apply 1 application topically at bedtime.   Insulin Aspart (NOVOLOG [place]) Inject 22 Units into the skin 2  (two) times daily.   levothyroxine (SYNTHROID, LEVOTHROID) 112 MCG tablet Take 100 mcg by mouth daily.    telmisartan-hydrochlorothiazide (MICARDIS HCT) 80-25 MG tablet Take 1 tablet by mouth daily.   No current facility-administered medications for this visit. (Other)      REVIEW OF SYSTEMS:    ALLERGIES Allergies  Allergen Reactions   Coffee Flavor Other (See Comments)    Sick on stomach, sneezing, backed up   Cephalexin Other (See Comments)    Yeast infection   Ciprofloxacin Other (See Comments)    tendinitis   Oatmeal Other (See Comments)    Sneezing and drainage   Sulfa Antibiotics Other (See Comments)    Yeast infection    PAST MEDICAL HISTORY Past Medical History:  Diagnosis Date   Anemia    Cystocele    DJD (degenerative joint disease)    Esophageal stricture    Essential hypertension    Hyperlipidemia    Hypothyroidism    Lichen sclerosus    Osteopenia    RBBB with left anterior fascicular block    Rectocele    Stress incontinence    Torn rotator cuff    Bilateral  Type 2 diabetes mellitus ([place])    Venous insufficiency    Past Surgical History:  Procedure Laterality Date   ABDOMINAL EXPOSURE N/A 05/09/2014   Procedure: ABDOMINAL EXPOSURE;  Surgeon: Rosetta Posner, MD;  Location: [facility];  Service: Vascular;  Laterality: N/A;   ANTERIOR LAT LUMBAR FUSION Right 05/09/2014   Procedure: Lumbar three-four, Lumbar four-five Anterior lateral lumbar fusion;  Surgeon: Erline Levine, MD;  Location: [facility];  Service: Neurosurgery;  Laterality: Right;   ANTERIOR LUMBAR FUSION N/A 05/09/2014   Procedure: Stage 1:Lumbar four-five, Lumbar five-Sacral one Anterior lumbar interbody fusion with Dr. Donnetta Hutching for approach ;  Surgeon: Erline Levine, MD;  Location: [facility];  Service: Neurosurgery;  Laterality: N/A;   CHOLECYSTECTOMY  1987   INCONTINENCE SURGERY     LUMBAR PERCUTANEOUS PEDICLE SCREW 3 LEVEL N/A 05/10/2014   Procedure: Stage 2: Percutaneous Pedicle  Screws; Laminectomy L3-4  ;  Surgeon: Erline Levine, MD;  Location: [facility];  Service: Neurosurgery;  Laterality: N/A;  Stage 2: Percutaneous Pedicle Screws T10 to Ileum; Laminectomy L3-4     RECTAL SURGERY     VAGINA SURGERY  2018   Josph Macho Touch- Vaginal Dryness    FAMILY HISTORY Family History  Problem Relation Age of Onset   Multiple myeloma Mother    Cancer Maternal Aunt        Cancer in the mouth - dipped snuff   Heart disease Brother    Diabetes Father    Colon polyps Brother    Heart disease Maternal Uncle    Heart disease Paternal Uncle    Heart disease Paternal Grandfather     SOCIAL HISTORY Social History   Tobacco Use   Smoking status: Never   Smokeless tobacco: Never  Vaping Use   Vaping Use: Never used  Substance Use Topics   Alcohol use: No   Drug use: Yes    Types: Hydromorphone         OPHTHALMIC EXAM:  Base Eye Exam     Visual Acuity (ETDRS)       Right Left   Dist [place] 20/25 -2 20/30 -1         Tonometry (Tonopen, 1:40 PM)       Right Left   Pressure 15 15         Pupils       Pupils Dark Light APD   Right PERRL 4 3 None   Left PERRL 4 3 None         Extraocular Movement       Right Left    Full Full         Neuro/Psych     Oriented x3: Yes   Mood/Affect: Normal         Dilation     Both eyes: 1.0% Mydriacyl, 2.5% Phenylephrine @ 1:40 PM           Slit Lamp and Fundus Exam     External Exam       Right Left   External Normal Normal         Slit Lamp Exam       Right Left   Lids/Lashes Normal Normal   Conjunctiva/Sclera White and quiet White and quiet   Cornea Clear Clear   Anterior Chamber Deep and quiet Deep and quiet   Iris Round and reactive Round and reactive   Lens Posterior chamber intraocular lens, centered, 1+ Posterior capsular opacification Posterior chamber intraocular lens, centered,  1+ Posterior capsular opacification   Anterior Vitreous Normal Normal         Fundus  Exam       Right Left   Posterior Vitreous Posterior vitreous detachment, Central vitreous floaters Posterior vitreous detachment, Vitreous hemorrhage 1+ central, and settling inferiorly   Disc Normal Normal   C/D Ratio 0.45 0.5   Macula Normal, no macular thickening Microaneurysms, no macular thickening   Vessels PDR-quiet PDR-quiet   Periphery Good PRP, light pigmented scars from prior laser yellow, with room temporal periphery for more PRP should it be required Good PRP, light pigmented scars from prior laser yellow , nearly 360, room for more laser inferiorly now that hemorrhage has cleared            IMAGING AND PROCEDURES  Imaging and Procedures for 12/30/20  OCT, Retina - OU - Both Eyes       Right Eye Quality was good. Scan locations included subfoveal. Central Foveal Thickness: 241. Progression has been stable. Findings include epiretinal membrane.   Left Eye Quality was good. Scan locations included subfoveal. Central Foveal Thickness: 244. Progression has been stable. Findings include normal foveal contour.   Notes No interval change OU with minor epiretinal membrane nasally OD  OS with medial opacity from vitreous hemorrhage     Color Fundus Photography Optos - OU - Both Eyes       Right Eye Progression has been stable. Macula : microaneurysms. Periphery : hemorrhage.   Left Eye Progression has been stable. Disc findings include normal observations. Macula : normal observations, microaneurysms. Periphery : hemorrhage.   Notes OS clear media hemorrhages cleared.  Good PRP peripherally  OD PDR quiescent with good peripheral PRP, clear media, good moderate scatter PRP OD, room for peripheral PRP temporally OD             ASSESSMENT/PLAN:  Macular pucker, right eye Minor no impact on acuity  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 determined by examination associated with type 2 diabetes mellitus ([place]) Good PRP  peripherally  Vitreous hemorrhage, left (HCC) Hemorrhage OS has cleared nicely  Proliferative diabetic retinopathy of right eye ([place]) Early PDR treated with peripheral PRP there is still room temporally for more laser should this region activate     ICD-10-CM   1. 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 Proliferative diabetic retinopathy of right eye without macular edema associated with type 2 diabetes mellitus (HCC)  E11.3591 OCT, Retina - OU - Both Eyes    Color Fundus Photography Optos - OU - Both Eyes    3. Macular pucker, right eye  H35.371     4. 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 determined by examination associated with type 2 diabetes mellitus ([place])  L79.8921     5. Vitreous hemorrhage, left (HCC)  H43.12       1.  Spontaneous clearance of vitreous hemorrhage left eye with good peripheral PRP, PDR quiet  2.  OD with early active PDR now quiet post peripheral PRP room for more laser temporally should it be required in the future  3.  Ophthalmic Meds Ordered this visit:  No orders of the defined types were placed in this encounter.      Return in about 6 months (around 06/30/2021) for DILATE  OU, COLOR FP.  There are no Patient Instructions on file for this visit.   Explained the diagnoses, plan, and follow up with the patient and they expressed understanding.  Patient expressed understanding of the importance of proper follow up care.   Clent Demark Codi Kertz M.D. Diseases & Surgery of the Retina and Vitreous Retina & Diabetic [facility] 12/30/20     Abbreviations: M myopia (nearsighted); A astigmatism; H hyperopia (farsighted); P presbyopia; Mrx spectacle prescription;  CTL contact lenses; OD right eye; OS left eye; OU both eyes  XT exotropia; ET esotropia; PEK punctate  epithelial keratitis; PEE punctate epithelial erosions; DES dry eye syndrome; MGD meibomian gland dysfunction; ATs artificial tears; PFAT's preservative free artificial tears; [place] nuclear sclerotic cataract; PSC posterior subcapsular cataract; ERM epi-retinal membrane; PVD posterior vitreous detachment; RD retinal detachment; DM diabetes mellitus; DR diabetic retinopathy; NPDR non-proliferative diabetic retinopathy; PDR proliferative diabetic retinopathy; CSME clinically significant macular edema; DME diabetic macular edema; dbh dot blot hemorrhages; CWS cotton wool spot; POAG primary open angle glaucoma; C/D cup-to-disc ratio; HVF humphrey visual field; GVF goldmann visual field; OCT optical coherence tomography; IOP intraocular pressure; BRVO Branch retinal vein occlusion; CRVO central retinal vein occlusion; CRAO central retinal artery occlusion; BRAO branch retinal artery occlusion; RT retinal tear; SB scleral buckle; PPV pars plana vitrectomy; VH Vitreous hemorrhage; PRP panretinal laser photocoagulation; IVK intravitreal kenalog; VMT vitreomacular traction; MH Macular hole;  NVD neovascularization of the disc; NVE neovascularization elsewhere; AREDS age related eye disease study; ARMD age related macular degeneration; POAG primary open angle glaucoma; EBMD epithelial/anterior basement membrane dystrophy; ACIOL anterior chamber intraocular lens; IOL intraocular lens; PCIOL posterior chamber intraocular lens; Phaco/IOL phacoemulsification with intraocular lens placement; [place] photorefractive keratectomy; LASIK laser assisted in situ keratomileusis; HTN hypertension; DM diabetes mellitus; COPD chronic obstructive pulmonary disease

## 2020-12-30 NOTE — Assessment & Plan Note (Signed)
Minor no impact on acuity 

## 2020-12-30 NOTE — Assessment & Plan Note (Signed)
Early PDR treated with peripheral PRP there is still room temporally for more laser should this region activate

## 2020-12-30 NOTE — Assessment & Plan Note (Signed)
Hemorrhage OS has cleared nicely

## 2020-12-30 NOTE — Assessment & Plan Note (Signed)
Good PRP peripherally

## 2021-02-05 DIAGNOSIS — R42 Dizziness and giddiness: Secondary | ICD-10-CM | POA: Diagnosis not present

## 2021-02-05 DIAGNOSIS — E039 Hypothyroidism, unspecified: Secondary | ICD-10-CM | POA: Diagnosis not present

## 2021-02-05 DIAGNOSIS — E1129 Type 2 diabetes mellitus with other diabetic kidney complication: Secondary | ICD-10-CM | POA: Diagnosis not present

## 2021-02-05 DIAGNOSIS — N184 Chronic kidney disease, stage 4 (severe): Secondary | ICD-10-CM | POA: Diagnosis not present

## 2021-02-05 DIAGNOSIS — E785 Hyperlipidemia, unspecified: Secondary | ICD-10-CM | POA: Diagnosis not present

## 2021-02-05 DIAGNOSIS — F39 Unspecified mood [affective] disorder: Secondary | ICD-10-CM | POA: Diagnosis not present

## 2021-02-05 DIAGNOSIS — H35069 Retinal vasculitis, unspecified eye: Secondary | ICD-10-CM | POA: Diagnosis not present

## 2021-02-05 DIAGNOSIS — D692 Other nonthrombocytopenic purpura: Secondary | ICD-10-CM | POA: Diagnosis not present

## 2021-02-05 DIAGNOSIS — R011 Cardiac murmur, unspecified: Secondary | ICD-10-CM | POA: Diagnosis not present

## 2021-02-05 DIAGNOSIS — I129 Hypertensive chronic kidney disease with stage 1 through stage 4 chronic kidney disease, or unspecified chronic kidney disease: Secondary | ICD-10-CM | POA: Diagnosis not present

## 2021-03-13 DIAGNOSIS — L821 Other seborrheic keratosis: Secondary | ICD-10-CM | POA: Diagnosis not present

## 2021-03-13 DIAGNOSIS — D2261 Melanocytic nevi of right upper limb, including shoulder: Secondary | ICD-10-CM | POA: Diagnosis not present

## 2021-04-29 ENCOUNTER — Other Ambulatory Visit: Payer: Self-pay | Admitting: Internal Medicine

## 2021-04-29 DIAGNOSIS — Z1231 Encounter for screening mammogram for malignant neoplasm of breast: Secondary | ICD-10-CM

## 2021-05-05 ENCOUNTER — Ambulatory Visit: Payer: PPO

## 2021-05-07 DIAGNOSIS — I129 Hypertensive chronic kidney disease with stage 1 through stage 4 chronic kidney disease, or unspecified chronic kidney disease: Secondary | ICD-10-CM | POA: Diagnosis not present

## 2021-05-07 DIAGNOSIS — D631 Anemia in chronic kidney disease: Secondary | ICD-10-CM | POA: Diagnosis not present

## 2021-05-07 DIAGNOSIS — N2581 Secondary hyperparathyroidism of renal origin: Secondary | ICD-10-CM | POA: Diagnosis not present

## 2021-05-07 DIAGNOSIS — N189 Chronic kidney disease, unspecified: Secondary | ICD-10-CM | POA: Diagnosis not present

## 2021-05-07 DIAGNOSIS — E669 Obesity, unspecified: Secondary | ICD-10-CM | POA: Diagnosis not present

## 2021-05-07 DIAGNOSIS — E1122 Type 2 diabetes mellitus with diabetic chronic kidney disease: Secondary | ICD-10-CM | POA: Diagnosis not present

## 2021-05-07 DIAGNOSIS — N184 Chronic kidney disease, stage 4 (severe): Secondary | ICD-10-CM | POA: Diagnosis not present

## 2021-05-12 ENCOUNTER — Ambulatory Visit
Admission: RE | Admit: 2021-05-12 | Discharge: 2021-05-12 | Disposition: A | Discharge status: Home / Self Care | Payer: PPO | Source: Ambulatory Visit | Attending: Internal Medicine | Admitting: Internal Medicine

## 2021-05-12 DIAGNOSIS — Z1231 Encounter for screening mammogram for malignant neoplasm of breast: Secondary | ICD-10-CM | POA: Diagnosis not present

## 2021-05-15 DIAGNOSIS — M10072 Idiopathic gout, left ankle and foot: Secondary | ICD-10-CM | POA: Diagnosis not present

## 2021-05-15 DIAGNOSIS — E1129 Type 2 diabetes mellitus with other diabetic kidney complication: Secondary | ICD-10-CM | POA: Diagnosis not present

## 2021-05-15 DIAGNOSIS — E785 Hyperlipidemia, unspecified: Secondary | ICD-10-CM | POA: Diagnosis not present

## 2021-05-15 DIAGNOSIS — E039 Hypothyroidism, unspecified: Secondary | ICD-10-CM | POA: Diagnosis not present

## 2021-05-19 DIAGNOSIS — H35069 Retinal vasculitis, unspecified eye: Secondary | ICD-10-CM | POA: Diagnosis not present

## 2021-05-19 DIAGNOSIS — Z0000 Encounter for general adult medical examination without abnormal findings: Secondary | ICD-10-CM | POA: Diagnosis not present

## 2021-05-19 DIAGNOSIS — R011 Cardiac murmur, unspecified: Secondary | ICD-10-CM | POA: Diagnosis not present

## 2021-05-19 DIAGNOSIS — E785 Hyperlipidemia, unspecified: Secondary | ICD-10-CM | POA: Diagnosis not present

## 2021-05-19 DIAGNOSIS — D692 Other nonthrombocytopenic purpura: Secondary | ICD-10-CM | POA: Diagnosis not present

## 2021-05-19 DIAGNOSIS — N184 Chronic kidney disease, stage 4 (severe): Secondary | ICD-10-CM | POA: Diagnosis not present

## 2021-05-19 DIAGNOSIS — E1129 Type 2 diabetes mellitus with other diabetic kidney complication: Secondary | ICD-10-CM | POA: Diagnosis not present

## 2021-05-19 DIAGNOSIS — R42 Dizziness and giddiness: Secondary | ICD-10-CM | POA: Diagnosis not present

## 2021-05-19 DIAGNOSIS — R82998 Other abnormal findings in urine: Secondary | ICD-10-CM | POA: Diagnosis not present

## 2021-05-19 DIAGNOSIS — E039 Hypothyroidism, unspecified: Secondary | ICD-10-CM | POA: Diagnosis not present

## 2021-05-19 DIAGNOSIS — I129 Hypertensive chronic kidney disease with stage 1 through stage 4 chronic kidney disease, or unspecified chronic kidney disease: Secondary | ICD-10-CM | POA: Diagnosis not present

## 2021-05-26 DIAGNOSIS — Z124 Encounter for screening for malignant neoplasm of cervix: Secondary | ICD-10-CM | POA: Diagnosis not present

## 2021-05-26 DIAGNOSIS — L900 Lichen sclerosus et atrophicus: Secondary | ICD-10-CM | POA: Diagnosis not present

## 2021-05-26 DIAGNOSIS — N952 Postmenopausal atrophic vaginitis: Secondary | ICD-10-CM | POA: Diagnosis not present

## 2021-05-26 DIAGNOSIS — Z6837 Body mass index (BMI) 37.0-37.9, adult: Secondary | ICD-10-CM | POA: Diagnosis not present

## 2021-06-30 ENCOUNTER — Ambulatory Visit (INDEPENDENT_AMBULATORY_CARE_PROVIDER_SITE_OTHER): Payer: PPO | Admitting: Ophthalmology

## 2021-06-30 ENCOUNTER — Encounter (INDEPENDENT_AMBULATORY_CARE_PROVIDER_SITE_OTHER): Payer: Self-pay | Admitting: Ophthalmology

## 2021-06-30 DIAGNOSIS — E113591 Type 2 diabetes mellitus with proliferative diabetic retinopathy without macular edema, right eye: Secondary | ICD-10-CM | POA: Diagnosis not present

## 2021-06-30 DIAGNOSIS — H35371 Puckering of macula, right eye: Secondary | ICD-10-CM

## 2021-06-30 DIAGNOSIS — H4312 Vitreous hemorrhage, left eye: Secondary | ICD-10-CM

## 2021-06-30 DIAGNOSIS — E113552 Type 2 diabetes mellitus with stable proliferative diabetic retinopathy, left eye: Secondary | ICD-10-CM

## 2021-06-30 DIAGNOSIS — E113512 Type 2 diabetes mellitus with proliferative diabetic retinopathy with macular edema, left eye: Secondary | ICD-10-CM | POA: Diagnosis not present

## 2021-06-30 NOTE — Assessment & Plan Note (Signed)

## 2021-06-30 NOTE — Assessment & Plan Note (Signed)
OS condition resolved

## 2021-06-30 NOTE — Assessment & Plan Note (Signed)
OD, no topographic distortion continue to observe

## 2021-06-30 NOTE — Progress Notes (Signed)
06/30/2021     CHIEF COMPLAINT Patient presents for  Chief Complaint  Patient presents with   Diabetic Retinopathy with Macular Edema      HISTORY OF PRESENT ILLNESS: Rhonda Clayton is a 76 y.o. female who presents to the clinic today for:   HPI   6 mos dilate OU, Color FP. Patient states vision is stable and unchanged since last visit. Denies any new floaters or FOL. Patient reports floaters are longstanding. Pt allergy to Ciprofloxacin. Patient reports A1C: 6.5 in April.  Last edited by Laurin Coder on 06/30/2021  1:54 PM.      Referring physician: Clent Jacks, MD [address] [place],  [place] [address]  HISTORICAL INFORMATION:   Selected notes from the MEDICAL RECORD NUMBER    Lab Results  Component Value Date   HGBA1C 8.9 (H) 05/09/2014     CURRENT MEDICATIONS: No current outpatient medications on file. (Ophthalmic Drugs)   No current facility-administered medications for this visit. (Ophthalmic Drugs)   Current Outpatient Medications (Other)  Medication Sig   amLODipine (NORVASC) 2.5 MG tablet 2 tablets.   aspirin EC 81 MG tablet Take 81 mg by mouth daily.   betamethasone dipropionate (DIPROLENE) 0.05 % cream Apply topically 2 (two) times daily.   chlorthalidone (HYGROTON) 25 MG tablet Take 25 mg by mouth daily.   clobetasol ointment (TEMOVATE) 3.00 % Apply 1 application topically at bedtime.   Insulin Aspart (NOVOLOG [place]) Inject 22 Units into the skin 2 (two) times daily.   levothyroxine (SYNTHROID, LEVOTHROID) 112 MCG tablet Take 100 mcg by mouth daily.    telmisartan-hydrochlorothiazide (MICARDIS HCT) 80-25 MG tablet Take 1 tablet by mouth daily.   No current facility-administered medications for this visit. (Other)      REVIEW OF SYSTEMS: ROS   Negative for: Constitutional, Gastrointestinal, Neurological, Skin, Genitourinary, Musculoskeletal, HENT, Endocrine, Cardiovascular, Eyes, Respiratory, Psychiatric, Allergic/Imm,  Heme/Lymph Last edited by Hurman Horn, MD on 06/30/2021  2:08 PM.       ALLERGIES Allergies  Allergen Reactions   Coffee Flavor Other (See Comments)    Sick on stomach, sneezing, backed up   Cephalexin Other (See Comments)    Yeast infection   Ciprofloxacin Other (See Comments)    tendinitis   Oatmeal Other (See Comments)    Sneezing and drainage   Sulfa Antibiotics Other (See Comments)    Yeast infection    PAST MEDICAL HISTORY Past Medical History:  Diagnosis Date   Anemia    Cystocele    DJD (degenerative joint disease)    Esophageal stricture    Essential hypertension    Hyperlipidemia    Hypothyroidism    Lichen sclerosus    Osteopenia    RBBB with left anterior fascicular block    Rectocele    Stress incontinence    Torn rotator cuff    Bilateral   Type 2 diabetes mellitus ([place])    Venous insufficiency    Vitreous hemorrhage, left ([place]) 05/25/2019   The nature of the vitreous hemorrhage was discussed with the patient as well as the common causes.   Patients with diabetic eye disease may develop retinal neovascularization.  Other eye conditions develop retinal  neovascularization secondary to retinal venous occlusions.   Vitreous hemorrhage may result from spontaneous vitreous detachment or retinal breaks.  Blunt trauma is a common cause as wl   Past Surgical History:  Procedure Laterality Date   ABDOMINAL EXPOSURE N/A 05/09/2014   Procedure: ABDOMINAL EXPOSURE;  Surgeon: Rosetta Posner, MD;  Location: [facility];  Service: Vascular;  Laterality: N/A;   ANTERIOR LAT LUMBAR FUSION Right 05/09/2014   Procedure: Lumbar three-four, Lumbar four-five Anterior lateral lumbar fusion;  Surgeon: Erline Levine, MD;  Location: [facility];  Service: Neurosurgery;  Laterality: Right;   ANTERIOR LUMBAR FUSION N/A 05/09/2014   Procedure: Stage 1:Lumbar four-five, Lumbar five-Sacral one Anterior lumbar interbody fusion with Dr. Donnetta Hutching for approach ;  Surgeon: Erline Levine, MD;   Location: [facility];  Service: Neurosurgery;  Laterality: N/A;   CHOLECYSTECTOMY  1987   INCONTINENCE SURGERY     LUMBAR PERCUTANEOUS PEDICLE SCREW 3 LEVEL N/A 05/10/2014   Procedure: Stage 2: Percutaneous Pedicle Screws; Laminectomy L3-4  ;  Surgeon: Erline Levine, MD;  Location: [facility];  Service: Neurosurgery;  Laterality: N/A;  Stage 2: Percutaneous Pedicle Screws T10 to Ileum; Laminectomy L3-4     RECTAL SURGERY     VAGINA SURGERY  2018   Josph Macho Touch- Vaginal Dryness    FAMILY HISTORY Family History  Problem Relation Age of Onset   Multiple myeloma Mother    Cancer Maternal Aunt        Cancer in the mouth - dipped snuff   Heart disease Brother    Diabetes Father    Colon polyps Brother    Heart disease Maternal Uncle    Heart disease Paternal Uncle    Heart disease Paternal Grandfather     SOCIAL HISTORY Social History   Tobacco Use   Smoking status: Never   Smokeless tobacco: Never  Vaping Use   Vaping Use: Never used  Substance Use Topics   Alcohol use: No   Drug use: Yes    Types: Hydromorphone         OPHTHALMIC EXAM:  Base Eye Exam     Visual Acuity (ETDRS)       Right Left   Dist [place] 20/20 -1 20/25 -2         Tonometry (Tonopen, 1:57 PM)       Right Left   Pressure 15 12         Pupils       Pupils Dark Light APD   Right PERRL 4 3 None   Left PERRL 4 3 None         Visual Fields (Counting fingers)       Left Right    Full Full         Extraocular Movement       Right Left    Full Full         Neuro/Psych     Oriented x3: Yes   Mood/Affect: Normal         Dilation     Both eyes: 1.0% Mydriacyl, 2.5% Phenylephrine @ 1:57 PM           Slit Lamp and Fundus Exam     External Exam       Right Left   External Normal Normal         Slit Lamp Exam       Right Left   Lids/Lashes Normal Normal   Conjunctiva/Sclera White and quiet White and quiet   Cornea Clear Clear   Anterior Chamber Deep  and quiet Deep and quiet   Iris Round and reactive Round and reactive   Lens Posterior chamber intraocular lens, centered, no posterior capsular opacification, Open posterior capsule Posterior chamber intraocular lens, centered, no posterior capsular  opacification, Open posterior capsule   Anterior Vitreous Normal Normal         Fundus Exam       Right Left   Posterior Vitreous Posterior vitreous detachment, Central vitreous floaters Posterior vitreous detachment, Vitreous hemorrhage 1+ central, and settling inferiorly   Disc Normal Normal   C/D Ratio 0.45 0.5   Macula Normal, no macular thickening Microaneurysms, no macular thickening   Vessels PDR-quiet PDR-quiet   Periphery Good PRP, light pigmented scars from prior laser yellow, with room temporal periphery for more PRP should it be required Good PRP, light pigmented scars from prior laser yellow , nearly 360, room for more laser inferiorly now that hemorrhage has cleared            IMAGING AND PROCEDURES  Imaging and Procedures for 06/30/21  Color Fundus Photography Optos - OU - Both Eyes       Right Eye Progression has been stable. Macula : microaneurysms. Periphery : hemorrhage.   Left Eye Progression has been stable. Disc findings include normal observations. Macula : normal observations, microaneurysms. Periphery : hemorrhage.   Notes OS clear media hemorrhages cleared.  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 The patient was advised to maintain good glucose, blood pressure, monitor kidney function and serum lipid control as advised by personal  physician. Rare risk for reactivation of progression exist with untreated severe anemia, untreated renal failure, untreated heart failure, and smoking. Complete avoidance of smoking was recommended. The chance of recurrent proliferative diabetic retinopathy was discussed as well as the chance of vitreous hemorrhage for which further treatments may be necessary.   Explained to the patient that the quiescent  proliferative diabetic retinopathy disease is unlikely to ever worsen.  Worsening factors would include however severe anemia, hypertension out-of-control or impending renal failure.  Proliferative diabetic retinopathy of right eye ([place]) The nature of regressed proliferative diabetic retinopathy was discussed with the patient. The patient was advised to maintain good glucose, blood pressure, monitor kidney function and serum lipid control as advised by personal physician. Rare risk for reactivation of progression exist with untreated severe anemia, untreated renal failure, untreated heart failure, and smoking. Complete avoidance of smoking was recommended. The chance of recurrent proliferative diabetic retinopathy was discussed as well as the chance of vitreous hemorrhage for which further treatments may be necessary.   Explained to the patient that the quiescent  proliferative diabetic retinopathy disease is unlikely to ever worsen.  Worsening factors would include however severe anemia, hypertension out-of-control or impending renal failure.  Macular pucker, right eye OD, no topographic distortion continue to observe  Vitreous hemorrhage, left (HCC) OS condition resolved     ICD-10-CM   1. Proliferative diabetic retinopathy of left eye with macular edema associated with type 2 diabetes mellitus (HCC)  G62.6948 Color Fundus Photography Optos - OU - Both Eyes    2. Proliferative diabetic retinopathy of right eye without macular edema associated with type 2 diabetes mellitus (HCC)  N46.2703 Color  Fundus Photography Optos - OU - Both Eyes    3. 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 Macular pucker, right eye  H35.371     5. Vitreous hemorrhage, left (HCC)  H43.12       1.  2.  3.  Ophthalmic Meds Ordered this visit:  No orders of the defined types were placed in this encounter.      Return in about 8 months (around 03/02/2022) for DILATE OU, COLOR FP, OCT.  There are no Patient Instructions on file for this visit.   Explained the diagnoses, plan, and follow up with the patient and they expressed understanding.  Patient expressed understanding of the importance of proper follow up care.   Clent Demark Luz Mares M.D. Diseases & Surgery of the Retina and Vitreous Retina & Diabetic [facility] 06/30/21     Abbreviations: M myopia (nearsighted); A astigmatism; H hyperopia (farsighted); P presbyopia; Mrx spectacle prescription;  CTL contact lenses; OD right eye; OS left eye; OU both eyes  XT exotropia; ET esotropia; PEK punctate epithelial keratitis; PEE punctate epithelial erosions; DES dry eye syndrome; MGD meibomian gland dysfunction; ATs artificial tears; PFAT's preservative free artificial tears; [place] nuclear sclerotic cataract; PSC posterior subcapsular cataract; ERM epi-retinal membrane; PVD posterior vitreous detachment; RD retinal detachment; DM diabetes mellitus; DR diabetic retinopathy; NPDR non-proliferative diabetic retinopathy; PDR proliferative diabetic retinopathy; CSME clinically significant macular edema; DME diabetic macular edema; dbh dot blot hemorrhages; CWS cotton wool spot; POAG primary open angle glaucoma; C/D cup-to-disc ratio; HVF humphrey visual field; GVF goldmann visual field; OCT optical coherence tomography; IOP intraocular pressure; BRVO Branch retinal vein occlusion; CRVO central retinal vein occlusion; CRAO central retinal artery occlusion;  BRAO branch retinal artery occlusion; RT retinal tear; SB scleral buckle; PPV pars plana vitrectomy; VH Vitreous hemorrhage; PRP panretinal laser photocoagulation; IVK intravitreal kenalog; VMT vitreomacular traction; MH Macular hole;  NVD neovascularization of the disc; NVE neovascularization elsewhere; AREDS age related eye disease study; ARMD age related macular degeneration; POAG primary open angle glaucoma; EBMD epithelial/anterior basement membrane dystrophy; ACIOL anterior chamber intraocular lens; IOL intraocular lens; PCIOL posterior chamber intraocular lens; Phaco/IOL phacoemulsification with intraocular lens placement; [place] photorefractive keratectomy; LASIK laser assisted in situ keratomileusis; HTN hypertension; DM diabetes mellitus; COPD chronic obstructive pulmonary disease

## 2021-09-11 DIAGNOSIS — I129 Hypertensive chronic kidney disease with stage 1 through stage 4 chronic kidney disease, or unspecified chronic kidney disease: Secondary | ICD-10-CM | POA: Diagnosis not present

## 2021-09-11 DIAGNOSIS — E1122 Type 2 diabetes mellitus with diabetic chronic kidney disease: Secondary | ICD-10-CM | POA: Diagnosis not present

## 2021-09-11 DIAGNOSIS — N189 Chronic kidney disease, unspecified: Secondary | ICD-10-CM | POA: Diagnosis not present

## 2021-09-11 DIAGNOSIS — E669 Obesity, unspecified: Secondary | ICD-10-CM | POA: Diagnosis not present

## 2021-09-11 DIAGNOSIS — N2581 Secondary hyperparathyroidism of renal origin: Secondary | ICD-10-CM | POA: Diagnosis not present

## 2021-09-11 DIAGNOSIS — D631 Anemia in chronic kidney disease: Secondary | ICD-10-CM | POA: Diagnosis not present

## 2021-09-11 DIAGNOSIS — N184 Chronic kidney disease, stage 4 (severe): Secondary | ICD-10-CM | POA: Diagnosis not present

## 2021-09-25 ENCOUNTER — Encounter (HOSPITAL_COMMUNITY): Payer: PPO

## 2021-09-25 ENCOUNTER — Encounter (INDEPENDENT_AMBULATORY_CARE_PROVIDER_SITE_OTHER): Payer: Self-pay

## 2021-09-25 DIAGNOSIS — E785 Hyperlipidemia, unspecified: Secondary | ICD-10-CM | POA: Diagnosis not present

## 2021-09-25 DIAGNOSIS — E1129 Type 2 diabetes mellitus with other diabetic kidney complication: Secondary | ICD-10-CM | POA: Diagnosis not present

## 2021-09-25 DIAGNOSIS — D692 Other nonthrombocytopenic purpura: Secondary | ICD-10-CM | POA: Diagnosis not present

## 2021-09-25 DIAGNOSIS — N184 Chronic kidney disease, stage 4 (severe): Secondary | ICD-10-CM | POA: Diagnosis not present

## 2021-09-25 DIAGNOSIS — R011 Cardiac murmur, unspecified: Secondary | ICD-10-CM | POA: Diagnosis not present

## 2021-09-25 DIAGNOSIS — H35069 Retinal vasculitis, unspecified eye: Secondary | ICD-10-CM | POA: Diagnosis not present

## 2021-09-25 DIAGNOSIS — F39 Unspecified mood [affective] disorder: Secondary | ICD-10-CM | POA: Diagnosis not present

## 2021-09-25 DIAGNOSIS — R42 Dizziness and giddiness: Secondary | ICD-10-CM | POA: Diagnosis not present

## 2021-09-25 DIAGNOSIS — E039 Hypothyroidism, unspecified: Secondary | ICD-10-CM | POA: Diagnosis not present

## 2021-09-25 DIAGNOSIS — I129 Hypertensive chronic kidney disease with stage 1 through stage 4 chronic kidney disease, or unspecified chronic kidney disease: Secondary | ICD-10-CM | POA: Diagnosis not present

## 2021-10-30 DIAGNOSIS — D509 Iron deficiency anemia, unspecified: Secondary | ICD-10-CM | POA: Diagnosis not present

## 2021-10-30 DIAGNOSIS — N184 Chronic kidney disease, stage 4 (severe): Secondary | ICD-10-CM | POA: Diagnosis not present

## 2021-11-13 DIAGNOSIS — D509 Iron deficiency anemia, unspecified: Secondary | ICD-10-CM | POA: Diagnosis not present

## 2021-11-13 DIAGNOSIS — N184 Chronic kidney disease, stage 4 (severe): Secondary | ICD-10-CM | POA: Diagnosis not present

## 2021-11-17 DIAGNOSIS — B3731 Acute candidiasis of vulva and vagina: Secondary | ICD-10-CM | POA: Diagnosis not present

## 2021-11-24 DIAGNOSIS — E1122 Type 2 diabetes mellitus with diabetic chronic kidney disease: Secondary | ICD-10-CM | POA: Diagnosis not present

## 2021-11-24 DIAGNOSIS — E669 Obesity, unspecified: Secondary | ICD-10-CM | POA: Diagnosis not present

## 2021-11-24 DIAGNOSIS — I129 Hypertensive chronic kidney disease with stage 1 through stage 4 chronic kidney disease, or unspecified chronic kidney disease: Secondary | ICD-10-CM | POA: Diagnosis not present

## 2021-11-24 DIAGNOSIS — N2581 Secondary hyperparathyroidism of renal origin: Secondary | ICD-10-CM | POA: Diagnosis not present

## 2021-11-24 DIAGNOSIS — D631 Anemia in chronic kidney disease: Secondary | ICD-10-CM | POA: Diagnosis not present

## 2021-11-24 DIAGNOSIS — B379 Candidiasis, unspecified: Secondary | ICD-10-CM | POA: Diagnosis not present

## 2021-11-24 DIAGNOSIS — N184 Chronic kidney disease, stage 4 (severe): Secondary | ICD-10-CM | POA: Diagnosis not present

## 2021-12-18 DIAGNOSIS — I10 Essential (primary) hypertension: Secondary | ICD-10-CM | POA: Diagnosis not present

## 2021-12-25 DIAGNOSIS — N184 Chronic kidney disease, stage 4 (severe): Secondary | ICD-10-CM | POA: Diagnosis not present

## 2021-12-31 DIAGNOSIS — I10 Essential (primary) hypertension: Secondary | ICD-10-CM | POA: Diagnosis not present

## 2022-03-02 ENCOUNTER — Encounter (INDEPENDENT_AMBULATORY_CARE_PROVIDER_SITE_OTHER): Payer: PPO | Admitting: Ophthalmology

## 2022-04-03 DIAGNOSIS — M109 Gout, unspecified: Secondary | ICD-10-CM | POA: Diagnosis not present

## 2022-04-03 DIAGNOSIS — H35069 Retinal vasculitis, unspecified eye: Secondary | ICD-10-CM | POA: Diagnosis not present

## 2022-04-03 DIAGNOSIS — M25519 Pain in unspecified shoulder: Secondary | ICD-10-CM | POA: Diagnosis not present

## 2022-04-03 DIAGNOSIS — I129 Hypertensive chronic kidney disease with stage 1 through stage 4 chronic kidney disease, or unspecified chronic kidney disease: Secondary | ICD-10-CM | POA: Diagnosis not present

## 2022-04-03 DIAGNOSIS — D692 Other nonthrombocytopenic purpura: Secondary | ICD-10-CM | POA: Diagnosis not present

## 2022-04-03 DIAGNOSIS — N184 Chronic kidney disease, stage 4 (severe): Secondary | ICD-10-CM | POA: Diagnosis not present

## 2022-04-03 DIAGNOSIS — E1129 Type 2 diabetes mellitus with other diabetic kidney complication: Secondary | ICD-10-CM | POA: Diagnosis not present

## 2022-04-03 DIAGNOSIS — E785 Hyperlipidemia, unspecified: Secondary | ICD-10-CM | POA: Diagnosis not present

## 2022-04-13 DIAGNOSIS — N184 Chronic kidney disease, stage 4 (severe): Secondary | ICD-10-CM | POA: Diagnosis not present

## 2022-04-13 DIAGNOSIS — E669 Obesity, unspecified: Secondary | ICD-10-CM | POA: Diagnosis not present

## 2022-04-13 DIAGNOSIS — N2581 Secondary hyperparathyroidism of renal origin: Secondary | ICD-10-CM | POA: Diagnosis not present

## 2022-04-13 DIAGNOSIS — I129 Hypertensive chronic kidney disease with stage 1 through stage 4 chronic kidney disease, or unspecified chronic kidney disease: Secondary | ICD-10-CM | POA: Diagnosis not present

## 2022-04-13 DIAGNOSIS — N189 Chronic kidney disease, unspecified: Secondary | ICD-10-CM | POA: Diagnosis not present

## 2022-04-13 DIAGNOSIS — D631 Anemia in chronic kidney disease: Secondary | ICD-10-CM | POA: Diagnosis not present

## 2022-04-13 DIAGNOSIS — E1122 Type 2 diabetes mellitus with diabetic chronic kidney disease: Secondary | ICD-10-CM | POA: Diagnosis not present

## 2022-04-14 DIAGNOSIS — E113551 Type 2 diabetes mellitus with stable proliferative diabetic retinopathy, right eye: Secondary | ICD-10-CM | POA: Diagnosis not present

## 2022-04-14 DIAGNOSIS — H35371 Puckering of macula, right eye: Secondary | ICD-10-CM | POA: Diagnosis not present

## 2022-04-14 DIAGNOSIS — E113552 Type 2 diabetes mellitus with stable proliferative diabetic retinopathy, left eye: Secondary | ICD-10-CM | POA: Diagnosis not present

## 2022-04-22 ENCOUNTER — Other Ambulatory Visit: Payer: Self-pay | Admitting: Internal Medicine

## 2022-04-22 DIAGNOSIS — Z1231 Encounter for screening mammogram for malignant neoplasm of breast: Secondary | ICD-10-CM

## 2022-04-29 DIAGNOSIS — M25512 Pain in left shoulder: Secondary | ICD-10-CM | POA: Diagnosis not present

## 2022-04-29 DIAGNOSIS — M25511 Pain in right shoulder: Secondary | ICD-10-CM | POA: Diagnosis not present

## 2022-04-29 DIAGNOSIS — M25611 Stiffness of right shoulder, not elsewhere classified: Secondary | ICD-10-CM | POA: Diagnosis not present

## 2022-05-11 DIAGNOSIS — M25611 Stiffness of right shoulder, not elsewhere classified: Secondary | ICD-10-CM | POA: Diagnosis not present

## 2022-05-11 DIAGNOSIS — M25512 Pain in left shoulder: Secondary | ICD-10-CM | POA: Diagnosis not present

## 2022-05-11 DIAGNOSIS — M25511 Pain in right shoulder: Secondary | ICD-10-CM | POA: Diagnosis not present

## 2022-05-27 DIAGNOSIS — E785 Hyperlipidemia, unspecified: Secondary | ICD-10-CM | POA: Diagnosis not present

## 2022-05-27 DIAGNOSIS — R7989 Other specified abnormal findings of blood chemistry: Secondary | ICD-10-CM | POA: Diagnosis not present

## 2022-05-27 DIAGNOSIS — N184 Chronic kidney disease, stage 4 (severe): Secondary | ICD-10-CM | POA: Diagnosis not present

## 2022-05-27 DIAGNOSIS — E1129 Type 2 diabetes mellitus with other diabetic kidney complication: Secondary | ICD-10-CM | POA: Diagnosis not present

## 2022-05-27 DIAGNOSIS — M109 Gout, unspecified: Secondary | ICD-10-CM | POA: Diagnosis not present

## 2022-05-27 DIAGNOSIS — E039 Hypothyroidism, unspecified: Secondary | ICD-10-CM | POA: Diagnosis not present

## 2022-05-28 DIAGNOSIS — L821 Other seborrheic keratosis: Secondary | ICD-10-CM | POA: Diagnosis not present

## 2022-05-28 DIAGNOSIS — D225 Melanocytic nevi of trunk: Secondary | ICD-10-CM | POA: Diagnosis not present

## 2022-05-28 DIAGNOSIS — D1801 Hemangioma of skin and subcutaneous tissue: Secondary | ICD-10-CM | POA: Diagnosis not present

## 2022-06-01 DIAGNOSIS — Z1212 Encounter for screening for malignant neoplasm of rectum: Secondary | ICD-10-CM | POA: Diagnosis not present

## 2022-06-03 DIAGNOSIS — E039 Hypothyroidism, unspecified: Secondary | ICD-10-CM | POA: Diagnosis not present

## 2022-06-03 DIAGNOSIS — Z6836 Body mass index (BMI) 36.0-36.9, adult: Secondary | ICD-10-CM | POA: Diagnosis not present

## 2022-06-03 DIAGNOSIS — Z0000 Encounter for general adult medical examination without abnormal findings: Secondary | ICD-10-CM | POA: Diagnosis not present

## 2022-06-03 DIAGNOSIS — D692 Other nonthrombocytopenic purpura: Secondary | ICD-10-CM | POA: Diagnosis not present

## 2022-06-03 DIAGNOSIS — E1129 Type 2 diabetes mellitus with other diabetic kidney complication: Secondary | ICD-10-CM | POA: Diagnosis not present

## 2022-06-03 DIAGNOSIS — N184 Chronic kidney disease, stage 4 (severe): Secondary | ICD-10-CM | POA: Diagnosis not present

## 2022-06-03 DIAGNOSIS — F39 Unspecified mood [affective] disorder: Secondary | ICD-10-CM | POA: Diagnosis not present

## 2022-06-03 DIAGNOSIS — I129 Hypertensive chronic kidney disease with stage 1 through stage 4 chronic kidney disease, or unspecified chronic kidney disease: Secondary | ICD-10-CM | POA: Diagnosis not present

## 2022-06-03 DIAGNOSIS — E785 Hyperlipidemia, unspecified: Secondary | ICD-10-CM | POA: Diagnosis not present

## 2022-06-03 DIAGNOSIS — R82998 Other abnormal findings in urine: Secondary | ICD-10-CM | POA: Diagnosis not present

## 2022-06-03 DIAGNOSIS — Z1339 Encounter for screening examination for other mental health and behavioral disorders: Secondary | ICD-10-CM | POA: Diagnosis not present

## 2022-06-03 DIAGNOSIS — D649 Anemia, unspecified: Secondary | ICD-10-CM | POA: Diagnosis not present

## 2022-06-03 DIAGNOSIS — H35069 Retinal vasculitis, unspecified eye: Secondary | ICD-10-CM | POA: Diagnosis not present

## 2022-06-03 DIAGNOSIS — Z1331 Encounter for screening for depression: Secondary | ICD-10-CM | POA: Diagnosis not present

## 2022-06-05 ENCOUNTER — Ambulatory Visit
Admission: RE | Admit: 2022-06-05 | Discharge: 2022-06-05 | Disposition: A | Discharge status: Home / Self Care | Payer: PPO | Source: Ambulatory Visit | Attending: Internal Medicine | Admitting: Internal Medicine

## 2022-06-05 DIAGNOSIS — Z1231 Encounter for screening mammogram for malignant neoplasm of breast: Secondary | ICD-10-CM | POA: Diagnosis not present

## 2022-06-09 DIAGNOSIS — E1122 Type 2 diabetes mellitus with diabetic chronic kidney disease: Secondary | ICD-10-CM | POA: Diagnosis not present

## 2022-06-09 DIAGNOSIS — M1990 Unspecified osteoarthritis, unspecified site: Secondary | ICD-10-CM | POA: Diagnosis not present

## 2022-06-09 DIAGNOSIS — I129 Hypertensive chronic kidney disease with stage 1 through stage 4 chronic kidney disease, or unspecified chronic kidney disease: Secondary | ICD-10-CM | POA: Diagnosis not present

## 2022-06-09 DIAGNOSIS — R809 Proteinuria, unspecified: Secondary | ICD-10-CM | POA: Diagnosis not present

## 2022-06-09 DIAGNOSIS — N184 Chronic kidney disease, stage 4 (severe): Secondary | ICD-10-CM | POA: Diagnosis not present

## 2022-06-10 DIAGNOSIS — Z01419 Encounter for gynecological examination (general) (routine) without abnormal findings: Secondary | ICD-10-CM | POA: Diagnosis not present

## 2022-06-10 DIAGNOSIS — N952 Postmenopausal atrophic vaginitis: Secondary | ICD-10-CM | POA: Diagnosis not present

## 2022-06-10 DIAGNOSIS — Z6837 Body mass index (BMI) 37.0-37.9, adult: Secondary | ICD-10-CM | POA: Diagnosis not present

## 2022-06-10 DIAGNOSIS — L900 Lichen sclerosus et atrophicus: Secondary | ICD-10-CM | POA: Diagnosis not present

## 2022-06-16 DIAGNOSIS — M25611 Stiffness of right shoulder, not elsewhere classified: Secondary | ICD-10-CM | POA: Diagnosis not present

## 2022-06-16 DIAGNOSIS — M25511 Pain in right shoulder: Secondary | ICD-10-CM | POA: Diagnosis not present

## 2022-06-16 DIAGNOSIS — M25512 Pain in left shoulder: Secondary | ICD-10-CM | POA: Diagnosis not present

## 2022-06-18 DIAGNOSIS — N184 Chronic kidney disease, stage 4 (severe): Secondary | ICD-10-CM | POA: Diagnosis not present

## 2022-06-18 DIAGNOSIS — E1129 Type 2 diabetes mellitus with other diabetic kidney complication: Secondary | ICD-10-CM | POA: Diagnosis not present

## 2022-07-01 DIAGNOSIS — M25511 Pain in right shoulder: Secondary | ICD-10-CM | POA: Diagnosis not present

## 2022-07-01 DIAGNOSIS — M25611 Stiffness of right shoulder, not elsewhere classified: Secondary | ICD-10-CM | POA: Diagnosis not present

## 2022-07-01 DIAGNOSIS — M25512 Pain in left shoulder: Secondary | ICD-10-CM | POA: Diagnosis not present

## 2022-07-15 DIAGNOSIS — L859 Epidermal thickening, unspecified: Secondary | ICD-10-CM | POA: Diagnosis not present

## 2022-07-15 DIAGNOSIS — D485 Neoplasm of uncertain behavior of skin: Secondary | ICD-10-CM | POA: Diagnosis not present

## 2022-08-04 DIAGNOSIS — M25511 Pain in right shoulder: Secondary | ICD-10-CM | POA: Diagnosis not present

## 2022-08-04 DIAGNOSIS — M25512 Pain in left shoulder: Secondary | ICD-10-CM | POA: Diagnosis not present

## 2022-08-04 DIAGNOSIS — M25611 Stiffness of right shoulder, not elsewhere classified: Secondary | ICD-10-CM | POA: Diagnosis not present

## 2022-08-19 DIAGNOSIS — M25611 Stiffness of right shoulder, not elsewhere classified: Secondary | ICD-10-CM | POA: Diagnosis not present

## 2022-08-19 DIAGNOSIS — M25512 Pain in left shoulder: Secondary | ICD-10-CM | POA: Diagnosis not present

## 2022-08-19 DIAGNOSIS — M25511 Pain in right shoulder: Secondary | ICD-10-CM | POA: Diagnosis not present

## 2022-08-20 DIAGNOSIS — E1122 Type 2 diabetes mellitus with diabetic chronic kidney disease: Secondary | ICD-10-CM | POA: Diagnosis not present

## 2022-08-20 DIAGNOSIS — N2581 Secondary hyperparathyroidism of renal origin: Secondary | ICD-10-CM | POA: Diagnosis not present

## 2022-08-20 DIAGNOSIS — N184 Chronic kidney disease, stage 4 (severe): Secondary | ICD-10-CM | POA: Diagnosis not present

## 2022-08-20 DIAGNOSIS — E669 Obesity, unspecified: Secondary | ICD-10-CM | POA: Diagnosis not present

## 2022-08-20 DIAGNOSIS — I129 Hypertensive chronic kidney disease with stage 1 through stage 4 chronic kidney disease, or unspecified chronic kidney disease: Secondary | ICD-10-CM | POA: Diagnosis not present

## 2022-08-20 DIAGNOSIS — D631 Anemia in chronic kidney disease: Secondary | ICD-10-CM | POA: Diagnosis not present

## 2022-08-20 DIAGNOSIS — N189 Chronic kidney disease, unspecified: Secondary | ICD-10-CM | POA: Diagnosis not present

## 2022-10-12 DIAGNOSIS — E113553 Type 2 diabetes mellitus with stable proliferative diabetic retinopathy, bilateral: Secondary | ICD-10-CM | POA: Diagnosis not present

## 2022-10-12 DIAGNOSIS — H35371 Puckering of macula, right eye: Secondary | ICD-10-CM | POA: Diagnosis not present

## 2022-10-27 DIAGNOSIS — N2581 Secondary hyperparathyroidism of renal origin: Secondary | ICD-10-CM | POA: Diagnosis not present

## 2022-10-27 DIAGNOSIS — E1122 Type 2 diabetes mellitus with diabetic chronic kidney disease: Secondary | ICD-10-CM | POA: Diagnosis not present

## 2022-10-27 DIAGNOSIS — D631 Anemia in chronic kidney disease: Secondary | ICD-10-CM | POA: Diagnosis not present

## 2022-10-27 DIAGNOSIS — I129 Hypertensive chronic kidney disease with stage 1 through stage 4 chronic kidney disease, or unspecified chronic kidney disease: Secondary | ICD-10-CM | POA: Diagnosis not present

## 2022-10-27 DIAGNOSIS — N184 Chronic kidney disease, stage 4 (severe): Secondary | ICD-10-CM | POA: Diagnosis not present

## 2022-12-14 DIAGNOSIS — D692 Other nonthrombocytopenic purpura: Secondary | ICD-10-CM | POA: Diagnosis not present

## 2022-12-14 DIAGNOSIS — E785 Hyperlipidemia, unspecified: Secondary | ICD-10-CM | POA: Diagnosis not present

## 2022-12-14 DIAGNOSIS — I129 Hypertensive chronic kidney disease with stage 1 through stage 4 chronic kidney disease, or unspecified chronic kidney disease: Secondary | ICD-10-CM | POA: Diagnosis not present

## 2022-12-14 DIAGNOSIS — M25519 Pain in unspecified shoulder: Secondary | ICD-10-CM | POA: Diagnosis not present

## 2022-12-14 DIAGNOSIS — E1129 Type 2 diabetes mellitus with other diabetic kidney complication: Secondary | ICD-10-CM | POA: Diagnosis not present

## 2022-12-14 DIAGNOSIS — I5030 Unspecified diastolic (congestive) heart failure: Secondary | ICD-10-CM | POA: Diagnosis not present

## 2022-12-14 DIAGNOSIS — E039 Hypothyroidism, unspecified: Secondary | ICD-10-CM | POA: Diagnosis not present

## 2022-12-14 DIAGNOSIS — N185 Chronic kidney disease, stage 5: Secondary | ICD-10-CM | POA: Diagnosis not present

## 2022-12-28 DIAGNOSIS — R062 Wheezing: Secondary | ICD-10-CM | POA: Diagnosis not present

## 2022-12-28 DIAGNOSIS — J0100 Acute maxillary sinusitis, unspecified: Secondary | ICD-10-CM | POA: Diagnosis not present

## 2023-01-03 ENCOUNTER — Encounter (HOSPITAL_COMMUNITY): Payer: Self-pay

## 2023-01-03 ENCOUNTER — Emergency Department (HOSPITAL_COMMUNITY): Payer: PPO

## 2023-01-03 ENCOUNTER — Inpatient Hospital Stay (HOSPITAL_COMMUNITY)
Admission: EM | Admit: 2023-01-03 | Discharge: 2023-01-05 | DRG: 193 | Disposition: A | Discharge status: Home Health | Payer: PPO | Attending: Family Medicine | Admitting: Family Medicine

## 2023-01-03 ENCOUNTER — Other Ambulatory Visit: Payer: Self-pay

## 2023-01-03 DIAGNOSIS — I452 Bifascicular block: Secondary | ICD-10-CM | POA: Diagnosis present

## 2023-01-03 DIAGNOSIS — Z79890 Hormone replacement therapy (postmenopausal): Secondary | ICD-10-CM

## 2023-01-03 DIAGNOSIS — J9601 Acute respiratory failure with hypoxia: Secondary | ICD-10-CM | POA: Insufficient documentation

## 2023-01-03 DIAGNOSIS — I132 Hypertensive heart and chronic kidney disease with heart failure and with stage 5 chronic kidney disease, or end stage renal disease: Secondary | ICD-10-CM | POA: Diagnosis present

## 2023-01-03 DIAGNOSIS — E785 Hyperlipidemia, unspecified: Secondary | ICD-10-CM | POA: Diagnosis present

## 2023-01-03 DIAGNOSIS — K219 Gastro-esophageal reflux disease without esophagitis: Secondary | ICD-10-CM | POA: Diagnosis present

## 2023-01-03 DIAGNOSIS — N184 Chronic kidney disease, stage 4 (severe): Secondary | ICD-10-CM | POA: Diagnosis present

## 2023-01-03 DIAGNOSIS — Z1152 Encounter for screening for COVID-19: Secondary | ICD-10-CM | POA: Diagnosis not present

## 2023-01-03 DIAGNOSIS — I5033 Acute on chronic diastolic (congestive) heart failure: Secondary | ICD-10-CM | POA: Diagnosis present

## 2023-01-03 DIAGNOSIS — Z83719 Family history of colon polyps, unspecified: Secondary | ICD-10-CM

## 2023-01-03 DIAGNOSIS — Z882 Allergy status to sulfonamides status: Secondary | ICD-10-CM

## 2023-01-03 DIAGNOSIS — R0609 Other forms of dyspnea: Secondary | ICD-10-CM | POA: Diagnosis not present

## 2023-01-03 DIAGNOSIS — R062 Wheezing: Secondary | ICD-10-CM | POA: Diagnosis not present

## 2023-01-03 DIAGNOSIS — J90 Pleural effusion, not elsewhere classified: Secondary | ICD-10-CM | POA: Diagnosis not present

## 2023-01-03 DIAGNOSIS — J180 Bronchopneumonia, unspecified organism: Principal | ICD-10-CM | POA: Diagnosis present

## 2023-01-03 DIAGNOSIS — Z981 Arthrodesis status: Secondary | ICD-10-CM | POA: Diagnosis not present

## 2023-01-03 DIAGNOSIS — Z79899 Other long term (current) drug therapy: Secondary | ICD-10-CM

## 2023-01-03 DIAGNOSIS — Z8249 Family history of ischemic heart disease and other diseases of the circulatory system: Secondary | ICD-10-CM | POA: Diagnosis not present

## 2023-01-03 DIAGNOSIS — Z888 Allergy status to other drugs, medicaments and biological substances status: Secondary | ICD-10-CM | POA: Diagnosis not present

## 2023-01-03 DIAGNOSIS — Z794 Long term (current) use of insulin: Secondary | ICD-10-CM | POA: Diagnosis not present

## 2023-01-03 DIAGNOSIS — Z881 Allergy status to other antibiotic agents status: Secondary | ICD-10-CM | POA: Diagnosis not present

## 2023-01-03 DIAGNOSIS — E039 Hypothyroidism, unspecified: Secondary | ICD-10-CM | POA: Diagnosis not present

## 2023-01-03 DIAGNOSIS — J189 Pneumonia, unspecified organism: Principal | ICD-10-CM | POA: Diagnosis present

## 2023-01-03 DIAGNOSIS — I10 Essential (primary) hypertension: Secondary | ICD-10-CM | POA: Diagnosis present

## 2023-01-03 DIAGNOSIS — Z9049 Acquired absence of other specified parts of digestive tract: Secondary | ICD-10-CM

## 2023-01-03 DIAGNOSIS — I700 Atherosclerosis of aorta: Secondary | ICD-10-CM | POA: Diagnosis present

## 2023-01-03 DIAGNOSIS — I872 Venous insufficiency (chronic) (peripheral): Secondary | ICD-10-CM | POA: Diagnosis present

## 2023-01-03 DIAGNOSIS — E1122 Type 2 diabetes mellitus with diabetic chronic kidney disease: Secondary | ICD-10-CM | POA: Diagnosis not present

## 2023-01-03 DIAGNOSIS — N189 Chronic kidney disease, unspecified: Secondary | ICD-10-CM | POA: Diagnosis not present

## 2023-01-03 DIAGNOSIS — I5030 Unspecified diastolic (congestive) heart failure: Secondary | ICD-10-CM | POA: Diagnosis present

## 2023-01-03 DIAGNOSIS — E669 Obesity, unspecified: Secondary | ICD-10-CM | POA: Diagnosis not present

## 2023-01-03 DIAGNOSIS — N185 Chronic kidney disease, stage 5: Secondary | ICD-10-CM | POA: Diagnosis not present

## 2023-01-03 DIAGNOSIS — Z807 Family history of other malignant neoplasms of lymphoid, hematopoietic and related tissues: Secondary | ICD-10-CM

## 2023-01-03 DIAGNOSIS — I2510 Atherosclerotic heart disease of native coronary artery without angina pectoris: Secondary | ICD-10-CM | POA: Diagnosis present

## 2023-01-03 DIAGNOSIS — Z7982 Long term (current) use of aspirin: Secondary | ICD-10-CM

## 2023-01-03 DIAGNOSIS — K5901 Slow transit constipation: Secondary | ICD-10-CM | POA: Diagnosis present

## 2023-01-03 DIAGNOSIS — E876 Hypokalemia: Secondary | ICD-10-CM | POA: Diagnosis not present

## 2023-01-03 DIAGNOSIS — R918 Other nonspecific abnormal finding of lung field: Secondary | ICD-10-CM | POA: Diagnosis not present

## 2023-01-03 DIAGNOSIS — M8580 Other specified disorders of bone density and structure, unspecified site: Secondary | ICD-10-CM | POA: Diagnosis present

## 2023-01-03 DIAGNOSIS — D649 Anemia, unspecified: Secondary | ICD-10-CM | POA: Insufficient documentation

## 2023-01-03 DIAGNOSIS — E113591 Type 2 diabetes mellitus with proliferative diabetic retinopathy without macular edema, right eye: Secondary | ICD-10-CM | POA: Diagnosis not present

## 2023-01-03 DIAGNOSIS — Z833 Family history of diabetes mellitus: Secondary | ICD-10-CM

## 2023-01-03 DIAGNOSIS — R9431 Abnormal electrocardiogram [ECG] [EKG]: Secondary | ICD-10-CM | POA: Diagnosis not present

## 2023-01-03 DIAGNOSIS — Z6837 Body mass index (BMI) 37.0-37.9, adult: Secondary | ICD-10-CM

## 2023-01-03 DIAGNOSIS — N179 Acute kidney failure, unspecified: Secondary | ICD-10-CM | POA: Diagnosis present

## 2023-01-03 DIAGNOSIS — I129 Hypertensive chronic kidney disease with stage 1 through stage 4 chronic kidney disease, or unspecified chronic kidney disease: Secondary | ICD-10-CM | POA: Diagnosis not present

## 2023-01-03 DIAGNOSIS — E119 Type 2 diabetes mellitus without complications: Secondary | ICD-10-CM

## 2023-01-03 LAB — BASIC METABOLIC PANEL
Anion gap: 11 (ref 5–15)
BUN: 58 mg/dL — ABNORMAL HIGH (ref 8–23)
CO2: 18 mmol/L — ABNORMAL LOW (ref 22–32)
Calcium: 8.8 mg/dL — ABNORMAL LOW (ref 8.9–10.3)
Chloride: 108 mmol/L (ref 98–111)
Creatinine, Ser: 3.32 mg/dL — ABNORMAL HIGH (ref 0.44–1.00)
GFR, Estimated: 14 mL/min — ABNORMAL LOW (ref >60)
Glucose, Bld: 233 mg/dL — ABNORMAL HIGH (ref 70–99)
Potassium: 3.6 mmol/L (ref 3.5–5.1)
Sodium: 137 mmol/L (ref 135–145)

## 2023-01-03 LAB — CBC WITH DIFFERENTIAL/PLATELET
Abs Immature Granulocytes: 0.06 10*3/uL (ref 0.00–0.07)
Basophils Absolute: 0.1 10*3/uL (ref 0.0–0.1)
Basophils Relative: 1 %
Eosinophils Absolute: 0.4 10*3/uL (ref 0.0–0.5)
Eosinophils Relative: 4 %
HCT: 28.4 % — ABNORMAL LOW (ref 36.0–46.0)
Hemoglobin: 9.4 g/dL — ABNORMAL LOW (ref 12.0–15.0)
Immature Granulocytes: 1 %
Lymphocytes Relative: 13 %
Lymphs Abs: 1.1 10*3/uL (ref 0.7–4.0)
MCH: 29.1 pg (ref 26.0–34.0)
MCHC: 33.1 g/dL (ref 30.0–36.0)
MCV: 87.9 fL (ref 80.0–100.0)
Monocytes Absolute: 0.9 10*3/uL (ref 0.1–1.0)
Monocytes Relative: 10 %
Neutro Abs: 6 10*3/uL (ref 1.7–7.7)
Neutrophils Relative %: 71 %
Platelets: 398 10*3/uL (ref 150–400)
RBC: 3.23 MIL/uL — ABNORMAL LOW (ref 3.87–5.11)
RDW: 15.4 % (ref 11.5–15.5)
WBC: 8.5 10*3/uL (ref 4.0–10.5)
nRBC: 0 % (ref 0.0–0.2)

## 2023-01-03 LAB — HEMOGLOBIN A1C
Hgb A1c MFr Bld: 7.9 % — ABNORMAL HIGH (ref 4.8–5.6)
Mean Plasma Glucose: 180.03 mg/dL

## 2023-01-03 LAB — RESP PANEL BY RT-PCR (RSV, FLU A&B, COVID)  RVPGX2
Influenza A by PCR: NEGATIVE
Influenza B by PCR: NEGATIVE
Resp Syncytial Virus by PCR: NEGATIVE
SARS Coronavirus 2 by RT PCR: NEGATIVE

## 2023-01-03 LAB — URINALYSIS, ROUTINE W REFLEX MICROSCOPIC
Bilirubin Urine: NEGATIVE
Glucose, UA: 150 mg/dL — AB
Ketones, ur: NEGATIVE mg/dL
Leukocytes,Ua: NEGATIVE
Nitrite: NEGATIVE
Protein, ur: 100 mg/dL — AB
Specific Gravity, Urine: 1.009 (ref 1.005–1.030)
pH: 6 (ref 5.0–8.0)

## 2023-01-03 LAB — BRAIN NATRIURETIC PEPTIDE: B Natriuretic Peptide: 709.1 pg/mL — ABNORMAL HIGH (ref 0.0–100.0)

## 2023-01-03 LAB — GLUCOSE, CAPILLARY
Glucose-Capillary: 265 mg/dL — ABNORMAL HIGH (ref 70–99)
Glucose-Capillary: 304 mg/dL — ABNORMAL HIGH (ref 70–99)
Glucose-Capillary: 214 mg/dL — ABNORMAL HIGH (ref 70–99)

## 2023-01-03 LAB — TSH: TSH: 2.589 u[IU]/mL (ref 0.350–4.500)

## 2023-01-03 LAB — D-DIMER, QUANTITATIVE: D-Dimer, Quant: 2.16 ug{FEU}/mL — ABNORMAL HIGH (ref 0.00–0.50)

## 2023-01-03 MED ORDER — IPRATROPIUM-ALBUTEROL 0.5-2.5 (3) MG/3ML IN SOLN
3.0000 mL | Freq: Three times a day (TID) | RESPIRATORY_TRACT | Status: DC
Start: 2023-01-03 — End: 2023-01-05
  Administered 2023-01-03 – 2023-01-04 (×4): 3 mL via RESPIRATORY_TRACT
  Filled 2023-01-03: qty 12
  Filled 2023-01-03 (×3): qty 3

## 2023-01-03 MED ORDER — AMLODIPINE BESYLATE 5 MG PO TABS
5.0000 mg | ORAL_TABLET | Freq: Every day | ORAL | Status: DC
  Administered 2023-01-03 – 2023-01-04 (×2): 5 mg via ORAL
  Filled 2023-01-03 (×2): qty 1

## 2023-01-03 MED ORDER — TRIAMCINOLONE ACETONIDE 0.5 % EX CREA: TOPICAL_CREAM | Freq: Every day | CUTANEOUS | Status: DC | PRN

## 2023-01-03 MED ORDER — LEVOTHYROXINE SODIUM 100 MCG PO TABS
100.0000 ug | ORAL_TABLET | Freq: Every day | ORAL | Status: DC
  Administered 2023-01-04 – 2023-01-05 (×2): 100 ug via ORAL
  Filled 2023-01-03 (×2): qty 1

## 2023-01-03 MED ORDER — SODIUM CHLORIDE 0.9 % IV SOLN
500.0000 mg | Freq: Once | INTRAVENOUS | Status: DC
  Filled 2023-01-03: qty 5

## 2023-01-03 MED ORDER — SODIUM CHLORIDE 0.9 % IV SOLN
1.0000 g | Freq: Once | INTRAVENOUS | Status: AC
  Administered 2023-01-03: 1 g via INTRAVENOUS
  Filled 2023-01-03: qty 10

## 2023-01-03 MED ORDER — ONDANSETRON HCL 4 MG PO TABS: 4.0000 mg | ORAL_TABLET | Freq: Four times a day (QID) | ORAL | Status: DC | PRN

## 2023-01-03 MED ORDER — FUROSEMIDE 20 MG PO TABS
20.0000 mg | ORAL_TABLET | ORAL | Status: DC
  Administered 2023-01-05: 20 mg via ORAL
  Filled 2023-01-03: qty 1

## 2023-01-03 MED ORDER — PROCHLORPERAZINE EDISYLATE 10 MG/2ML IJ SOLN: 5.0000 mg | INTRAMUSCULAR | Status: DC | PRN

## 2023-01-03 MED ORDER — BUDESONIDE 0.5 MG/2ML IN SUSP
0.5000 mg | Freq: Two times a day (BID) | RESPIRATORY_TRACT | Status: DC
  Administered 2023-01-03 – 2023-01-05 (×4): 0.5 mg via RESPIRATORY_TRACT
  Filled 2023-01-03 (×4): qty 2

## 2023-01-03 MED ORDER — ACETAMINOPHEN 325 MG PO TABS: 650.0000 mg | ORAL_TABLET | Freq: Four times a day (QID) | ORAL | Status: DC | PRN

## 2023-01-03 MED ORDER — SODIUM CHLORIDE 0.9 % IV SOLN
1.0000 g | INTRAVENOUS | Status: DC
  Administered 2023-01-04: 1 g via INTRAVENOUS
  Filled 2023-01-03 (×2): qty 10

## 2023-01-03 MED ORDER — ALBUTEROL SULFATE HFA 108 (90 BASE) MCG/ACT IN AERS: 2.0000 | INHALATION_SPRAY | RESPIRATORY_TRACT | Status: DC | PRN

## 2023-01-03 MED ORDER — INSULIN ASPART PROT & ASPART (70-30 MIX) 100 UNIT/ML ~~LOC~~ SUSP
20.0000 [IU] | Freq: Two times a day (BID) | SUBCUTANEOUS | Status: DC
  Administered 2023-01-03 – 2023-01-05 (×4): 20 [IU] via SUBCUTANEOUS
  Filled 2023-01-03 (×2): qty 10

## 2023-01-03 MED ORDER — INSULIN ASPART PROT & ASPART (70-30 MIX) 100 UNIT/ML PEN
20.0000 [IU] | PEN_INJECTOR | Freq: Two times a day (BID) | SUBCUTANEOUS | Status: DC
  Filled 2023-01-03: qty 3

## 2023-01-03 MED ORDER — DOXYCYCLINE HYCLATE 100 MG IV SOLR
100.0000 mg | Freq: Two times a day (BID) | INTRAVENOUS | Status: DC
  Administered 2023-01-03 – 2023-01-05 (×4): 100 mg via INTRAVENOUS
  Filled 2023-01-03 (×5): qty 100

## 2023-01-03 MED ORDER — HEPARIN SODIUM (PORCINE) 5000 UNIT/ML IJ SOLN
5000.0000 [IU] | Freq: Three times a day (TID) | INTRAMUSCULAR | Status: DC
  Administered 2023-01-03 – 2023-01-05 (×5): 5000 [IU] via SUBCUTANEOUS
  Filled 2023-01-03 (×5): qty 1

## 2023-01-03 MED ORDER — FUROSEMIDE 20 MG PO TABS: 20.0000 mg | ORAL_TABLET | ORAL | Status: DC

## 2023-01-03 MED ORDER — ACETAMINOPHEN 650 MG RE SUPP: 650.0000 mg | Freq: Four times a day (QID) | RECTAL | Status: DC | PRN

## 2023-01-03 MED ORDER — LEVALBUTEROL HCL 0.63 MG/3ML IN NEBU: 0.6300 mg | INHALATION_SOLUTION | Freq: Three times a day (TID) | RESPIRATORY_TRACT | Status: DC

## 2023-01-03 MED ORDER — FUROSEMIDE 40 MG PO TABS
40.0000 mg | ORAL_TABLET | ORAL | Status: DC
  Administered 2023-01-04: 40 mg via ORAL
  Filled 2023-01-03: qty 1

## 2023-01-03 MED ORDER — ONDANSETRON HCL 4 MG/2ML IJ SOLN: 4.0000 mg | Freq: Four times a day (QID) | INTRAMUSCULAR | Status: DC | PRN

## 2023-01-03 MED ORDER — ALBUTEROL SULFATE (2.5 MG/3ML) 0.083% IN NEBU: 2.5000 mg | INHALATION_SOLUTION | RESPIRATORY_TRACT | Status: DC | PRN

## 2023-01-03 MED ORDER — IPRATROPIUM-ALBUTEROL 0.5-2.5 (3) MG/3ML IN SOLN
3.0000 mL | Freq: Once | RESPIRATORY_TRACT | Status: AC
  Administered 2023-01-03: 3 mL via RESPIRATORY_TRACT
  Filled 2023-01-03: qty 3

## 2023-01-03 NOTE — Progress Notes (Signed)
MD Hewitt Blade to be made aware of CBG 265. No new orders for insulin at this time.

## 2023-01-03 NOTE — H&P (Addendum)
History and Physical    Patient: Rhonda Clayton ZOX:[id number] DOB: 1945/11/17 DOA: 01/03/2023 DOS: the patient was seen and examined on 01/03/2023 PCP: Chilton Greathouse, MD  Patient coming from: Home  Chief Complaint:  Chief Complaint  Patient presents with   Shortness of Breath   HPI: Rhonda Clayton is a 77 y.o. female with medical history significant of anemia renal disease, stage IV CKD, cystocele, rectocele, DJD, subpial stricture, GERD, hypertension, hyperlipidemia, hypothyroidism, lichen sclerosis, osteopenia, right bundle branch block, rectocele, stress incontinence, dark rotator cuff, type 2 diabetes mellitus, venous insufficiency, left vitreous hemorrhage who came into the emergency department due to progressively worsening dyspnea, orthopnea and wheezing.  She was seen at an urgent care on Monday, prescribed albuterol inhaler and an antibiotic.  She felt so likely better, but in the last few days symptoms have become progressively worse.  No sick contacts or travel history.  She has been having mostly dry cough.  No lower extremity edema, dizziness, diaphoresis or chest pain.  No nausea, vomiting or diarrhea.  No dysuria or frequency.  Lab work: Her urinalysis showed glucose of 150 and protein 100 mg/deciliter.  There was small hemoglobin and rare bacteria microscopic semination.  Coronavirus, influenza and RSV PCR was negative.  CBC is her white count 8.5, hemoglobin 9.4 g/dL platelets 811.  D-dimer was 2.16.  TSH was normal.  BNP 709.1 pg/mL.  BMP showed a CO2 of 18 mmol/L, with a normal anion gap.  Normal sodium, potassium and chloride.  Glucose of 133, BUN 58, creatinine 3.32 and calcium 8.8 mg/dL.  Imaging: 2 view chest radiograph concerning for acute bronchitis.  There is small bilateral pleural effusions, aortic atherosclerosis.  CT chest without contrast showing small layering pleural effusions with multifocal upper lobe predominant peribronchial groundglass opacity.   Favorability of lobar pneumonia bronchopneumonia.  Atypical appearance of pulmonary edema felt less likely.  Aortic and coronary artery atherosclerosis.  No cardiomegaly.   ED course: Initial vital signs were temperature 98.2 F, pulse 75, respiration 16, BP 1 6764 mmHg O2 sat 100%.  She is currently satting 100% on nasal cannula oxygen.  The patient received ceftriaxone, doxycycline and a DuoNeb in the emergency department.  Review of Systems: As mentioned in the history of present illness. All other systems reviewed and are negative.  Past Medical History:  Diagnosis Date   Anemia    Cystocele    DJD (degenerative joint disease)    Esophageal stricture    Essential hypertension    Hyperlipidemia    Hypothyroidism    Lichen sclerosus    Osteopenia    RBBB with left anterior fascicular block    Rectocele    Stress incontinence    Torn rotator cuff    Bilateral   Type 2 diabetes mellitus (HCC)    Venous insufficiency    Vitreous hemorrhage, left (HCC) 05/25/2019   The nature of the vitreous hemorrhage was discussed with the patient as well as the common causes.   Patients with diabetic eye disease may develop retinal neovascularization.  Other eye conditions develop retinal  neovascularization secondary to retinal venous occlusions.   Vitreous hemorrhage may result from spontaneous vitreous detachment or retinal breaks.  Blunt trauma is a common cause as wl   Past Surgical History:  Procedure Laterality Date   ABDOMINAL EXPOSURE N/A 05/09/2014   Procedure: ABDOMINAL EXPOSURE;  Surgeon: Larina Earthly, MD;  Location: [facility];  Service: Vascular;  Laterality: N/A;   ANTERIOR LAT LUMBAR FUSION  Right 05/09/2014   Procedure: Lumbar three-four, Lumbar four-five Anterior lateral lumbar fusion;  Surgeon: Maeola Harman, MD;  Location: [facility];  Service: Neurosurgery;  Laterality: Right;   ANTERIOR LUMBAR FUSION N/A 05/09/2014   Procedure: Stage 1:Lumbar four-five, Lumbar five-Sacral one  Anterior lumbar interbody fusion with Dr. Arbie Cookey for approach ;  Surgeon: Maeola Harman, MD;  Location: [facility];  Service: Neurosurgery;  Laterality: N/A;   CHOLECYSTECTOMY  1987   INCONTINENCE SURGERY     LUMBAR PERCUTANEOUS PEDICLE SCREW 3 LEVEL N/A 05/10/2014   Procedure: Stage 2: Percutaneous Pedicle Screws; Laminectomy L3-4  ;  Surgeon: Maeola Harman, MD;  Location: [facility];  Service: Neurosurgery;  Laterality: N/A;  Stage 2: Percutaneous Pedicle Screws T10 to Ileum; Laminectomy L3-4     RECTAL SURGERY     VAGINA SURGERY  2018   Drue Stager Touch- Vaginal Dryness   Social History:  reports that she has never smoked. She has never used smokeless tobacco. She reports current drug use. Drug: Hydromorphone. She reports that she does not drink alcohol.  Allergies  Allergen Reactions   Coffee Flavoring Agent (Non-Screening) Other (See Comments)    Sick on stomach, sneezing, backed up   Azithromycin Other (See Comments)   Molds & Smuts     Allergy test showed    Pneumococcal Vac Polyvalent Other (See Comments)    Large site reaction    Cephalexin Other (See Comments)    Yeast infection   Ciprofloxacin Other (See Comments)    tendinitis   Oatmeal Other (See Comments)    Sneezing and drainage   Sulfa Antibiotics Other (See Comments)    Yeast infection    Family History  Problem Relation Age of Onset   Multiple myeloma Mother    Cancer Maternal Aunt        Cancer in the mouth - dipped snuff   Heart disease Brother    Diabetes Father    Colon polyps Brother    Heart disease Maternal Uncle    Heart disease Paternal Uncle    Heart disease Paternal Grandfather     Prior to Admission medications   Medication Sig Start Date End Date Taking? Authorizing Provider  albuterol (VENTOLIN HFA) 108 (90 Base) MCG/ACT inhaler Inhale 2 puffs into the lungs every 4 (four) hours as needed. 12/28/22  Yes [provider]  amoxicillin (AMOXIL) 875 MG tablet Take 875 mg by mouth 2  (two) times daily. 12/28/22  Yes [provider]  amLODipine (NORVASC) 2.5 MG tablet 2 tablets.    [provider]  aspirin EC 81 MG tablet Take 81 mg by mouth daily.    [provider]  betamethasone dipropionate (DIPROLENE) 0.05 % cream Apply topically 2 (two) times daily.    [provider]  chlorthalidone (HYGROTON) 25 MG tablet Take 25 mg by mouth daily.    [provider]  clobetasol ointment (TEMOVATE) 0.05 % Apply 1 application topically at bedtime.    [provider]  furosemide (LASIX) 40 MG tablet Take 1 tablet by mouth daily.    [provider]  Insulin Aspart (NOVOLOG [place]) Inject 22 Units into the skin 2 (two) times daily.    [provider]  levothyroxine (SYNTHROID, LEVOTHROID) 112 MCG tablet Take 100 mcg by mouth daily.     [provider]  NOVOLOG MIX 70/30 FLEXPEN (70-30) 100 UNIT/ML FlexPen INJECT 24 UNITS DAILY BEFORE BREAKFAST AND 23 UNITS BEFORE DINNER.    [provider]  telmisartan-hydrochlorothiazide (MICARDIS HCT) 80-25 MG tablet Take 1 tablet by mouth daily.    [provider]    Physical Exam: Vitals:   01/03/23 0743 01/03/23 1117  BP: (!) 167/64 (!) 154/62  Pulse: 75 74  Resp: 16 (!) 22  Temp: 98.2 F (36.8 C) 98.4 F (36.9 C)  TempSrc: Oral Oral  SpO2: 100% 99%  Weight: 90.3 kg   Height: 5' 1.5" (1.562 m)    Physical Exam Vitals and nursing note reviewed.  Constitutional:      General: She is awake. She is not in acute distress.    Appearance: She is well-developed. She is obese. She is ill-appearing.     Interventions: Nasal cannula in place.  HENT:     Head: Normocephalic.     Nose: No rhinorrhea.     Mouth/Throat:     Mouth: Mucous membranes are moist.  Eyes:     General: No scleral icterus.    Pupils: Pupils are equal, round, and reactive to light.  Neck:     Vascular: No JVD.  Cardiovascular:     Rate and Rhythm: Normal rate and regular  rhythm.     Heart sounds: S1 normal and S2 normal.  Pulmonary:     Effort: Tachypnea present.     Breath sounds: Decreased breath sounds, wheezing, rhonchi and rales present.  Abdominal:     General: Bowel sounds are normal. There is no distension.     Palpations: Abdomen is soft.     Tenderness: There is no abdominal tenderness.  Musculoskeletal:     Cervical back: Neck supple.     Right lower leg: No edema.     Left lower leg: No edema.  Skin:    General: Skin is warm and dry.  Neurological:     General: No focal deficit present.     Mental Status: She is alert and oriented to person, place, and time.  Psychiatric:        Mood and Affect: Mood normal.        Behavior: Behavior normal. Behavior is cooperative.     Data Reviewed:  Results are pending, will review when available. 08/28/2020 echocardiogram report IMPRESSIONS:   1. Left ventricular ejection fraction, by estimation, is 55 to 60%. The left ventricle has normal function. The left ventricle has no regional wall motion abnormalities. Left ventricular diastolic parameters are consistent with Grade I diastolic dysfunction (impaired relaxation).  2. The aortic valve is tricuspid. There is moderate calcification of the aortic valve. There is moderate thickening of the aortic valve. Aortic valve regurgitation is not visualized. Mild to moderate aortic valve sclerosis/calcification is present, without any evidence of aortic stenosis. Aortic valve mean gradient measures 7.0 mmHg.  3. Right ventricular systolic function is normal. The right ventricular size is normal.  4. The mitral valve is grossly normal. No evidence of mitral valve regurgitation. No evidence of mitral stenosis. Moderate mitral annular calcification.  EKG: Vent. rate 74 BPM PR interval 208 ms QRS duration 155 ms QT/QTcB 488/542 ms P-R-T axes -38 -57 75 Sinus rhythm Ventricular premature complex Right bundle branch block LVH with IVCD and  secondary repol abnrm Prolonged QT interval  Assessment and Plan: Principal Problem:   Multifocal pneumonia Admit to PCU/inpatient. Continue supplemental oxygen. Scheduled and as needed bronchodilators. Continue ceftriaxone 1 g IVPB daily. Begin doxycycline 100 mg IVPB every 12 hours. Check strep pneumoniae urinary antigen. Check sputum Gram stain, culture and sensitivity. Follow-up blood culture and sensitivity. Follow-up CBC  and chemistry in the morning.  Active Problems:   CKD (chronic kidney disease), stage IV (HCC) Monitor renal function and electrolytes. Continue with alternating furosemide 20/40 mg doses every other days. Follow-up with nephrology as an outpatient.    CHF with left ventricular diastolic dysfunction, NYHA class 1 (HCC)  Having orthopnea. No cardiomegaly on x-ray. Elevated BNP. Will check echocardiogram in AM.    DM (diabetes mellitus) (HCC) Carbohydrate modified diet. CBG monitoring with meals and bedtime. Continue NovoLog 70/30 20 units SQ twice daily. Check hemoglobin A1c.    HTN (hypertension) Continue daily furosemide. Continue amlodipine 5 mg p.o. bedtime.    Hypothyroid Levothyroxine 100 mcg p.o. daily.    Slow transit constipation Treated as needed.    Proliferative diabetic retinopathy of right eye [facility]) Follow-up with ophthalmology as an outpatient.    Prolonged QT interval Keep electrolytes optimized. Avoid QT prolonging meds as possible.   Advance Care Planning:   Code Status: Full Code   Consults:   Family Communication:   Severity of Illness: The appropriate patient status for this patient is INPATIENT. Inpatient status is judged to be reasonable and necessary in order to provide the required intensity of service to ensure the patient's safety. The patient's presenting symptoms, physical exam findings, and initial radiographic and laboratory data in the context of their chronic comorbidities is felt to place them at high  risk for further clinical deterioration. Furthermore, it is not anticipated that the patient will be medically stable for discharge from the hospital within 2 midnights of admission.   * I certify that at the point of admission it is my clinical judgment that the patient will require inpatient hospital care spanning beyond 2 midnights from the point of admission due to high intensity of service, high risk for further deterioration and high frequency of surveillance required.*  Author: Bobette Mo, MD 01/03/2023 12:15 PM  For on call review [email or web address].   This document was prepared using Dragon voice recognition software and may contain some unintended transcription errors.

## 2023-01-03 NOTE — ED Triage Notes (Signed)
Pt complaining of exertional SOB, and an inability to lay down without increased SOB. Pt was seen Monday at an Urgent care and was given an albuterol inhaler, and abx. Pt says it helped some. Pt does endorse some wheezing, denies any N/V/D.

## 2023-01-03 NOTE — ED Provider Notes (Signed)
[place] EMERGENCY DEPARTMENT AT [facility] Provider Note   CSN: [id number] Arrival date & time: 01/03/23  0730     History {Add pertinent medical, surgical, social history, OB history to HPI:1} Chief Complaint  Patient presents with   Shortness of Breath    AURIELLE HENDLEY is a 77 y.o. female.  Pt is a 77 yo female with pmhx significant for htn, hypothyroidism, hld, djd, dm2, and ckd.  Pt said she has been sob since around Thanksgiving.  She has been getting more sob.  She used to be able to walk without any problems.  Now, she can't walk or lay flat without getting sob.  She is sob while talking to me.  She has seen her pcp and UC.  She was given amox and an inhaler on 12/9.  That has helped some, but she is still very sob.         Home Medications Prior to Admission medications   Medication Sig Start Date End Date Taking? Authorizing Provider  albuterol (VENTOLIN HFA) 108 (90 Base) MCG/ACT inhaler Inhale 2 puffs into the lungs every 4 (four) hours as needed. 12/28/22  Yes [provider]  amoxicillin (AMOXIL) 875 MG tablet Take 875 mg by mouth 2 (two) times daily. 12/28/22  Yes [provider]  amLODipine (NORVASC) 2.5 MG tablet 2 tablets.    [provider]  aspirin EC 81 MG tablet Take 81 mg by mouth daily.    [provider]  betamethasone dipropionate (DIPROLENE) 0.05 % cream Apply topically 2 (two) times daily.    [provider]  chlorthalidone (HYGROTON) 25 MG tablet Take 25 mg by mouth daily.    [provider]  clobetasol ointment (TEMOVATE) 0.05 % Apply 1 application topically at bedtime.    [provider]  furosemide (LASIX) 40 MG tablet Take 1 tablet by mouth daily.    [provider]  Insulin Aspart (NOVOLOG [place]) Inject 22 Units into the skin 2 (two) times daily.    [provider]  levothyroxine (SYNTHROID, LEVOTHROID) 112 MCG tablet Take 100 mcg by mouth daily.      [provider]  NOVOLOG MIX 70/30 FLEXPEN (70-30) 100 UNIT/ML FlexPen INJECT 24 UNITS DAILY BEFORE BREAKFAST AND 23 UNITS BEFORE DINNER.    [provider]  telmisartan-hydrochlorothiazide (MICARDIS HCT) 80-25 MG tablet Take 1 tablet by mouth daily.    [provider]      Allergies    Coffee flavoring agent (non-screening), Azithromycin, Pneumococcal vac polyvalent, Cephalexin, Ciprofloxacin, Oatmeal, and Sulfa antibiotics    Review of Systems   Review of Systems  Respiratory:  Positive for shortness of breath.   All other systems reviewed and are negative.   Physical Exam Updated Vital Signs BP (!) 167/64   Pulse 75   Temp 98.2 F (36.8 C) (Oral)   Resp 16   Ht 5' 1.5" (1.562 m)   Wt 90.3 kg   SpO2 100%   BMI 36.99 kg/m  Physical Exam Vitals and nursing note reviewed.  Constitutional:      Appearance: She is well-developed. She is obese.  HENT:     Head: Normocephalic and atraumatic.     Mouth/Throat:     Mouth: Mucous membranes are moist.     Pharynx: Oropharynx is clear.  Eyes:     Extraocular Movements: Extraocular movements intact.     Pupils: Pupils are equal, round, and reactive to light.  Cardiovascular:     Rate  and Rhythm: Normal rate and regular rhythm.  Pulmonary:     Effort: Tachypnea present.     Breath sounds: Normal breath sounds.  Abdominal:     General: Bowel sounds are normal.     Palpations: Abdomen is soft.  Musculoskeletal:        General: Normal range of motion.     Cervical back: Normal range of motion and neck supple.  Skin:    General: Skin is warm.     Capillary Refill: Capillary refill takes less than 2 seconds.  Neurological:     General: No focal deficit present.     Mental Status: She is alert and oriented to person, place, and time.  Psychiatric:        Mood and Affect: Mood normal.        Behavior: Behavior normal.     ED Results / Procedures / Treatments   Labs (all labs ordered are listed,  but only abnormal results are displayed) Labs Reviewed  CBC WITH DIFFERENTIAL/PLATELET - Abnormal; Notable for the following components:      Result Value   RBC 3.23 (*)    Hemoglobin 9.4 (*)    HCT 28.4 (*)    All other components within normal limits  RESP PANEL BY RT-PCR (RSV, FLU A&B, COVID)  RVPGX2  BASIC METABOLIC PANEL  BRAIN NATRIURETIC PEPTIDE  D-DIMER, QUANTITATIVE  TSH  URINALYSIS, ROUTINE W REFLEX MICROSCOPIC    EKG None  Radiology DG Chest 2 View Result Date: 01/03/2023 CLINICAL DATA:  77 year old female with history of shortness of breath. EXAM: CHEST - 2 VIEW COMPARISON:  Chest x-ray 03/31/2007. FINDINGS: Lung volumes are normal. No confluent consolidative airspace disease. Small bilateral pleural effusions. Diffuse interstitial prominence and widespread peribronchial cuffing. No pneumothorax. No evidence of pulmonary edema. Heart size is normal. Upper mediastinal contours are within normal limits. Atherosclerotic calcifications are noted in the thoracic aorta. Orthopedic rod and screw fixation hardware in the lower thoracic and upper lumbar spine, incompletely imaged. IMPRESSION: 1. The appearance the chest is concerning for acute bronchitis. 2. Small bilateral pleural effusions. 3. Aortic atherosclerosis. Electronically Signed   By: Trudie Reed M.D.   On: 01/03/2023 08:29    Procedures Procedures  {Document cardiac monitor, telemetry assessment procedure when appropriate:1}  Medications Ordered in ED Medications  ipratropium-albuterol (DUONEB) 0.5-2.5 (3) MG/3ML nebulizer solution 3 mL (has no administration in time range)    ED Course/ Medical Decision Making/ A&P   {   Click here for ABCD2, HEART and other calculatorsREFRESH Note before signing :1}                              Medical Decision Making Amount and/or Complexity of Data Reviewed Labs: ordered. Radiology: ordered.  Risk Prescription drug management.   This patient presents to the ED  for concern of sob, this involves an extensive number of treatment options, and is a complaint that carries with it a high risk of complications and morbidity.  The differential diagnosis includes chf, copd, pna, covid/flu/rsv, anemia   Co morbidities that complicate the patient evaluation  htn, hypothyroidism, hld, djd, dm2, and ckd   Additional history obtained:  Additional history obtained from epic chart review External records from outside source obtained and reviewed including family   Lab Tests:  I Ordered, and personally interpreted labs.  The pertinent results include:  cbc with hgb 9.4 (stable); bmp with bun 58 and cr 3.32 (  gfr 14; pt has hx of ckd stage 5); covid/flu/rsv neg.   Imaging Studies ordered:  I ordered imaging studies including cxr and ct chest I independently visualized and interpreted imaging which showed  The appearance the chest is concerning for acute bronchitis.  2. Small bilateral pleural effusions.  3. Aortic atherosclerosis.   I agree with the radiologist interpretation   Cardiac Monitoring:  The patient was maintained on a cardiac monitor.  I personally viewed and interpreted the cardiac monitored which showed an underlying rhythm of: nsr   Medicines ordered and prescription drug management:  I ordered medication including duoneb  for sx  Reevaluation of the patient after these medicines showed that the patient improved I have reviewed the patients home medicines and have made adjustments as needed   Test Considered:  ct   Critical Interventions:  ***   Consultations Obtained:  I requested consultation with the ***,  and discussed lab and imaging findings as well as pertinent plan - they recommend: ***   Problem List / ED Course:  ***   Reevaluation:  After the interventions noted above, I reevaluated the patient and found that they have :{resolved/improved/worsened:23923::"improved"}   Social Determinants of  Health:  ***   Dispostion:  After consideration of the diagnostic results and the patients response to treatment, I feel that the patent would benefit from ***.    {Document critical care time when appropriate:1} {Document review of labs and clinical decision tools ie heart score, Chads2Vasc2 etc:1}  {Document your independent review of radiology images, and any outside records:1} {Document your discussion with family members, caretakers, and with consultants:1} {Document social determinants of health affecting pt's care:1} {Document your decision making why or why not admission, treatments were needed:1} Final Clinical Impression(s) / ED Diagnoses Final diagnoses:  None    Rx / DC Orders ED Discharge Orders     None

## 2023-01-04 ENCOUNTER — Inpatient Hospital Stay (HOSPITAL_COMMUNITY): Payer: PPO

## 2023-01-04 DIAGNOSIS — R0609 Other forms of dyspnea: Secondary | ICD-10-CM | POA: Diagnosis not present

## 2023-01-04 DIAGNOSIS — J189 Pneumonia, unspecified organism: Secondary | ICD-10-CM | POA: Diagnosis not present

## 2023-01-04 LAB — COMPREHENSIVE METABOLIC PANEL
ALT: 16 U/L (ref 0–44)
AST: 16 U/L (ref 15–41)
Albumin: 3.3 g/dL — ABNORMAL LOW (ref 3.5–5.0)
Alkaline Phosphatase: 66 U/L (ref 38–126)
Anion gap: 8 (ref 5–15)
BUN: 56 mg/dL — ABNORMAL HIGH (ref 8–23)
CO2: 19 mmol/L — ABNORMAL LOW (ref 22–32)
Calcium: 8.2 mg/dL — ABNORMAL LOW (ref 8.9–10.3)
Chloride: 109 mmol/L (ref 98–111)
Creatinine, Ser: 3.42 mg/dL — ABNORMAL HIGH (ref 0.44–1.00)
GFR, Estimated: 13 mL/min — ABNORMAL LOW (ref >60)
Glucose, Bld: 125 mg/dL — ABNORMAL HIGH (ref 70–99)
Potassium: 3.1 mmol/L — ABNORMAL LOW (ref 3.5–5.1)
Sodium: 136 mmol/L (ref 135–145)
Total Bilirubin: 0.7 mg/dL (ref <1.2)
Total Protein: 7 g/dL (ref 6.5–8.1)

## 2023-01-04 LAB — RESPIRATORY PANEL BY PCR

## 2023-01-04 LAB — PROCALCITONIN: Procalcitonin: <0.1 ng/mL

## 2023-01-04 LAB — EXPECTORATED SPUTUM ASSESSMENT W GRAM STAIN, RFLX TO RESP C

## 2023-01-04 LAB — CBC
HCT: 26.9 % — ABNORMAL LOW (ref 36.0–46.0)
Hemoglobin: 8.4 g/dL — ABNORMAL LOW (ref 12.0–15.0)
MCH: 28.3 pg (ref 26.0–34.0)
MCHC: 31.2 g/dL (ref 30.0–36.0)
MCV: 90.6 fL (ref 80.0–100.0)
Platelets: 363 10*3/uL (ref 150–400)
RBC: 2.97 MIL/uL — ABNORMAL LOW (ref 3.87–5.11)
RDW: 15.3 % (ref 11.5–15.5)
WBC: 6.1 10*3/uL (ref 4.0–10.5)
nRBC: 0 % (ref 0.0–0.2)

## 2023-01-04 LAB — ECHOCARDIOGRAM COMPLETE
AR max vel: 0.94 cm2
AV Area VTI: 1.13 cm2
AV Area mean vel: 0.87 cm2
AV Mean grad: 11 mm[Hg]
AV Peak grad: 18.6 mm[Hg]
Ao pk vel: 2.16 m/s
Area-P 1/2: 4.39 cm2
Calc EF: 62.1 %
Est EF: 55
Height: 61.5 in
MV VTI: 1.69 cm2
S' Lateral: 3.2 cm
Single Plane A2C EF: 62.2 %
Single Plane A4C EF: 57.2 %
Weight: 3248.7 [oz_av]

## 2023-01-04 LAB — GLUCOSE, CAPILLARY
Glucose-Capillary: 129 mg/dL — ABNORMAL HIGH (ref 70–99)
Glucose-Capillary: 132 mg/dL — ABNORMAL HIGH (ref 70–99)
Glucose-Capillary: 151 mg/dL — ABNORMAL HIGH (ref 70–99)
Glucose-Capillary: 185 mg/dL — ABNORMAL HIGH (ref 70–99)
Glucose-Capillary: 190 mg/dL — ABNORMAL HIGH (ref 70–99)

## 2023-01-04 LAB — STREP PNEUMONIAE URINARY ANTIGEN: Strep Pneumo Urinary Antigen: NEGATIVE

## 2023-01-04 MED ORDER — INSULIN ASPART 100 UNIT/ML IJ SOLN
0.0000 [IU] | Freq: Three times a day (TID) | INTRAMUSCULAR | Status: DC
  Administered 2023-01-04 (×2): 3 [IU] via SUBCUTANEOUS
  Administered 2023-01-05: 2 [IU] via SUBCUTANEOUS

## 2023-01-04 MED ORDER — POTASSIUM CHLORIDE CRYS ER 20 MEQ PO TBCR
40.0000 meq | EXTENDED_RELEASE_TABLET | ORAL | Status: AC
Start: 2023-01-04 — End: 2023-01-04
  Administered 2023-01-04 (×2): 40 meq via ORAL
  Filled 2023-01-04 (×2): qty 2

## 2023-01-04 MED ORDER — INSULIN ASPART 100 UNIT/ML IJ SOLN: 0.0000 [IU] | Freq: Every day | INTRAMUSCULAR | Status: DC

## 2023-01-04 NOTE — Progress Notes (Signed)
PROGRESS NOTE    Rhonda Clayton  QMV:[id number] DOB: Jun 19, 1945 DOA: 01/03/2023 PCP: Chilton Greathouse, MD   Brief Narrative:  HPI: Rhonda Clayton is a 77 y.o. female with medical history significant of anemia renal disease, stage IV CKD, cystocele, rectocele, DJD, subpial stricture, GERD, hypertension, hyperlipidemia, hypothyroidism, lichen sclerosis, osteopenia, right bundle branch block, rectocele, stress incontinence, dark rotator cuff, type 2 diabetes mellitus, venous insufficiency, left vitreous hemorrhage who came into the emergency department due to progressively worsening dyspnea, orthopnea and wheezing.  She was seen at an urgent care on Monday, prescribed albuterol inhaler and an antibiotic.  She felt so likely better, but in the last few days symptoms have become progressively worse.  No sick contacts or travel history.  She has been having mostly dry cough.  No lower extremity edema, dizziness, diaphoresis or chest pain.  No nausea, vomiting or diarrhea.  No dysuria or frequency.   Lab work: Her urinalysis showed glucose of 150 and protein 100 mg/deciliter.  There was small hemoglobin and rare bacteria microscopic semination.  Coronavirus, influenza and RSV PCR was negative.  CBC is her white count 8.5, hemoglobin 9.4 g/dL platelets 284.  D-dimer was 2.16.  TSH was normal.  BNP 709.1 pg/mL.  BMP showed a CO2 of 18 mmol/L, with a normal anion gap.  Normal sodium, potassium and chloride.  Glucose of 133, BUN 58, creatinine 3.32 and calcium 8.8 mg/dL.   Imaging: 2 view chest radiograph concerning for acute bronchitis.  There is small bilateral pleural effusions, aortic atherosclerosis.  CT chest without contrast showing small layering pleural effusions with multifocal upper lobe predominant peribronchial groundglass opacity.  Favorability of lobar pneumonia bronchopneumonia.  Atypical appearance of pulmonary edema felt less likely.  Aortic and coronary artery atherosclerosis.  No  cardiomegaly.   ED course: Initial vital signs were temperature 98.2 F, pulse 75, respiration 16, BP 1 6764 mmHg O2 sat 100%.  She is currently satting 100% on nasal cannula oxygen.  The patient received ceftriaxone, doxycycline and a DuoNeb in the emergency department.  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  Chest x-ray consistent with multifocal pneumonia.  Continue Rocephin and Zithromax.  Tested negative for COVID, flu and RSV.  Will check procalcitonin and respiratory viral panel.  Hypokalemia: Will replenish.  Acute on chronic diastolic congestive heart failure: Clinically appears euvolemic but does have crackles and BNP elevated up to 700 but no baseline available.  She does appear to have acute exacerbation.  However she also has AKI so we have to be cautious with Lasix/nephrotoxic.  I will continue her on current p.o. home dose of Lasix.  Monitor strict I's and O's.  Daily weight and low-sodium diet.  Type 2 diabetes mellitus: Hemoglobin A1c 7.9.  Blood sugar controlled, continue home dose of NovoLog Mix 70/30 20 units twice daily and add SSI.  Essential hypertension: Blood pressure controlled.  Continue home amlodipine but hold Micardis.  Acquired hypothyroidism: Continue Synthroid.  Proliferative diabetic retinopathy of right eye [facility]) Follow-up with ophthalmology as an outpatient.  Prolonged QT interval Keep electrolytes optimized. Avoid QT prolonging meds as possible.  DVT prophylaxis: heparin injection 5,000 Units Start: 01/03/23  2200   Code Status: Full Code  Family Communication:  None present at bedside.  Plan of care discussed with patient in length and he/she verbalized understanding and agreed with it.  Status is: Inpatient Remains inpatient appropriate because: Still hypoxic, needs hospitalization for further management.   Estimated body mass index is 37.74 kg/m as calculated from the following:   Height as of this encounter: 5' 1.5" (1.562 m).   Weight as of this encounter: 92.1 kg.    Nutritional Assessment: Body mass index is 37.74 kg/m.Marland Kitchen Seen by dietician.  I agree with the assessment and plan as outlined below: Nutrition Status:        . Skin Assessment: I have examined the patient's skin and I agree with the wound assessment as performed by the wound care RN as outlined below:    Consultants:  None  Procedures:  None  Antimicrobials:  Anti-infectives (From admission, onward)    Start     Dose/Rate Route Frequency Ordered Stop   01/04/23 1300  cefTRIAXone (ROCEPHIN) 1 g in sodium chloride 0.9 % 100 mL IVPB        1 g 200 mL/hr over 30 Minutes Intravenous Every 24 hours 01/03/23 1610     01/03/23 1300  doxycycline (VIBRAMYCIN) 100 mg in dextrose 5 % 250 mL IVPB        100 mg 125 mL/hr over 120 Minutes Intravenous Every 12 hours 01/03/23 1259     01/03/23 1215  cefTRIAXone (ROCEPHIN) 1 g in sodium chloride 0.9 % 100 mL IVPB        1 g 200 mL/hr over 30 Minutes Intravenous  Once 01/03/23 1202 01/03/23 1325   01/03/23 1215  azithromycin (ZITHROMAX) 500 mg in sodium chloride 0.9 % 250 mL IVPB  Status:  Discontinued        500 mg 250 mL/hr over 60 Minutes Intravenous  Once 01/03/23 1202 01/03/23 1259         Subjective: Patient seen and examined.  She says that she is feeling better but still with some shortness of breath.  No other complaint.  Objective: Vitals:   01/03/23 1350 01/03/23 1933 01/03/23 2100 01/04/23 0524  BP: (!) 145/67  (!) 135/56 135/62  Pulse: 76  70 65   Resp: 20   15  Temp: 97.7 F (36.5 C)  98.1 F (36.7 C) 98 F (36.7 C)  TempSrc: Oral  Oral Oral  SpO2: 100% 93% 97% 96%  Weight: 92.1 kg     Height:        Intake/Output Summary (Last 24 hours) at 01/04/2023 0756 Last data filed at 01/04/2023 0304 Gross per 24 hour  Intake 840.35 ml  Output 650 ml  Net 190.35 ml   Filed Weights   01/03/23 0743 01/03/23 1350  Weight: 90.3 kg 92.1 kg    Examination:  General exam: Appears calm and comfortable  Respiratory system: Faint crackles bilaterally.  Respiratory effort normal. Cardiovascular system: S1 & S2 heard, RRR. No JVD, murmurs, rubs, gallops or clicks. No pedal edema. Gastrointestinal system: Abdomen is nondistended, soft and nontender. No organomegaly or masses felt. Normal bowel sounds heard. Central nervous system: Alert and oriented. No focal neurological deficits. Extremities: Symmetric 5 x 5 power. Skin: No rashes, lesions or ulcers Psychiatry: Judgement and insight appear normal. Mood & affect appropriate.    Data Reviewed: I have personally  reviewed following labs and imaging studies  CBC: Recent Labs  Lab 01/03/23 0925 01/04/23 0507  WBC 8.5 6.1  NEUTROABS 6.0  --   HGB 9.4* 8.4*  HCT 28.4* 26.9*  MCV 87.9 90.6  PLT 398 363   Basic Metabolic Panel: Recent Labs  Lab 01/03/23 0925 01/04/23 0507  NA 137 136  K 3.6 3.1*  CL 108 109  CO2 18* 19*  GLUCOSE 233* 125*  BUN 58* 56*  CREATININE 3.32* 3.42*  CALCIUM 8.8* 8.2*   GFR: Estimated Creatinine Clearance: 13.9 mL/min (A) (by C-G formula based on SCr of 3.42 mg/dL (H)). Liver Function Tests: Recent Labs  Lab 01/04/23 0507  AST 16  ALT 16  ALKPHOS 66  BILITOT 0.7  PROT 7.0  ALBUMIN 3.3*   No results for input(s): "LIPASE", "AMYLASE" in the last 168 hours. No results for input(s): "AMMONIA" in the last 168 hours. Coagulation Profile: No results for input(s): "INR", "PROTIME" in the last 168 hours. Cardiac Enzymes: No results for  input(s): "CKTOTAL", "CKMB", "CKMBINDEX", "TROPONINI" in the last 168 hours. BNP (last 3 results) No results for input(s): "PROBNP" in the last 8760 hours. HbA1C: Recent Labs    01/03/23 1436  HGBA1C 7.9*   CBG: Recent Labs  Lab 01/03/23 1436 01/03/23 1642 01/03/23 2159 01/04/23 0050 01/04/23 0753  GLUCAP 265* 304* 214* 151* 132*   Lipid Profile: No results for input(s): "CHOL", "HDL", "LDLCALC", "TRIG", "CHOLHDL", "LDLDIRECT" in the last 72 hours. Thyroid Function Tests: Recent Labs    01/03/23 0925  TSH 2.589   Anemia Panel: No results for input(s): "VITAMINB12", "FOLATE", "FERRITIN", "TIBC", "IRON", "RETICCTPCT" in the last 72 hours. Sepsis Labs: No results for input(s): "PROCALCITON", "LATICACIDVEN" in the last 168 hours.  Recent Results (from the past 240 hours)  Resp panel by RT-PCR (RSV, Flu A&B, Covid) Anterior Nasal Swab     Status: None   Collection Time: 01/03/23  9:25 AM   Specimen: Anterior Nasal Swab  Result Value Ref Range Status   SARS Coronavirus 2 by RT PCR NEGATIVE NEGATIVE Final    Comment: (NOTE) SARS-CoV-2 target nucleic acids are NOT DETECTED.  The SARS-CoV-2 RNA is generally detectable in upper respiratory specimens during the acute phase of infection. The lowest concentration of SARS-CoV-2 viral copies this assay can detect is 138 copies/mL. A negative result does not preclude SARS-Cov-2 infection and should not be used as the sole basis for treatment or other patient management decisions. A negative result may occur with  improper specimen collection/handling, submission of specimen other than nasopharyngeal swab, presence of viral mutation(s) within the areas targeted by this assay, and inadequate number of viral copies(<138 copies/mL). A negative result must be combined with clinical observations, patient history, and epidemiological information. The expected result is Negative.  Fact Sheet for Patients:   BloggerCourse.com  Fact Sheet for Healthcare Providers:  SeriousBroker.it  This test is no t yet approved or cleared by the [place] FDA and  has been authorized for detection and/or diagnosis of SARS-CoV-2 by FDA under an Emergency Use Authorization (EUA). This EUA will remain  in effect (meaning this test can be used) for the duration of the COVID-19 declaration under Section 564(b)(1) of the Act, 21 U.S.C.section 360bbb-3(b)(1), unless the authorization is terminated  or revoked sooner.       Influenza A by PCR NEGATIVE NEGATIVE Final   Influenza B by PCR NEGATIVE NEGATIVE Final    Comment: (NOTE) The Xpert Xpress SARS-CoV-2/FLU/RSV plus assay is intended as  an aid in the diagnosis of influenza from Nasopharyngeal swab specimens and should not be used as a sole basis for treatment. Nasal washings and aspirates are unacceptable for Xpert Xpress SARS-CoV-2/FLU/RSV testing.  Fact Sheet for Patients: BloggerCourse.com  Fact Sheet for Healthcare Providers: SeriousBroker.it  This test is not yet approved or cleared by the [place] FDA and has been authorized for detection and/or diagnosis of SARS-CoV-2 by FDA under an Emergency Use Authorization (EUA). This EUA will remain in effect (meaning this test can be used) for the duration of the COVID-19 declaration under Section 564(b)(1) of the Act, 21 U.S.C. section 360bbb-3(b)(1), unless the authorization is terminated or revoked.     Resp Syncytial Virus by PCR NEGATIVE NEGATIVE Final    Comment: (NOTE) Fact Sheet for Patients: BloggerCourse.com  Fact Sheet for Healthcare Providers: SeriousBroker.it  This test is not yet approved or cleared by the [place] FDA and has been authorized for detection and/or diagnosis of SARS-CoV-2 by FDA under an Emergency Use  Authorization (EUA). This EUA will remain in effect (meaning this test can be used) for the duration of the COVID-19 declaration under Section 564(b)(1) of the Act, 21 U.S.C. section 360bbb-3(b)(1), unless the authorization is terminated or revoked.  Performed at [facility], [address] [place], [place] [address]   Culture, blood (routine x 2)     Status: None (Preliminary result)   Collection Time: 01/03/23  2:55 PM   Specimen: BLOOD RIGHT ARM  Result Value Ref Range Status   Specimen Description   Final    BLOOD RIGHT ARM Performed at [facility], [address], [place], [place] [address]    Special Requests   Final    BOTTLES DRAWN AEROBIC AND ANAEROBIC Blood Culture results may not be optimal due to an inadequate volume of blood received in culture bottles Performed at [facility], [address] [place] [address], [place], [place] [address]    Culture PENDING  Incomplete   Report Status PENDING  Incomplete  Culture, blood (routine x 2)     Status: None (Preliminary result)   Collection Time: 01/03/23  2:55 PM   Specimen: BLOOD LEFT ARM  Result Value Ref Range Status   Specimen Description   Final    BLOOD LEFT ARM Performed at [facility], [address], [place], [place] [address]    Special Requests   Final    BOTTLES DRAWN AEROBIC AND ANAEROBIC Blood Culture results may not be optimal due to an inadequate volume of blood received in culture bottles Performed at [facility], [address], [place], [place] [address]    Culture PENDING  Incomplete   Report Status PENDING  Incomplete     Radiology Studies: CT Chest Wo Contrast Result Date: 01/03/2023 CLINICAL DATA:  77 year old female with shortness of breath. Orthopnea. Wheezing. EXAM: CT CHEST WITHOUT CONTRAST TECHNIQUE: Multidetector CT imaging of the chest was performed following the standard protocol without IV contrast. RADIATION DOSE REDUCTION: This  exam was performed according to the departmental dose-optimization program which includes automated exposure control, adjustment of the mA and/or kV according to patient size and/or use of iterative reconstruction technique. COMPARISON:  Chest radiographs 0814 hours today. FINDINGS: Cardiovascular: Calcified aortic atherosclerosis. Calcified coronary artery atherosclerosis (series 2, image 31). No cardiomegaly or pericardial effusion. Mediastinum/Nodes: Negative noncontrast appearance. Lungs/Pleura: Layering bilateral pleural effusions with simple fluid density are small volume. Major airways are patent. Right upper lobe multifocal peribronchial areas  of ground-glass opacity, largest on series 4, image 55. A few small areas are also present in the contralateral left upper lobe (series 4, image 38). The right middle lobe is affected on sagittal image 49. The lower lobes are relatively spared. No consolidation. There is mild pulmonary septal thickening only at the lung bases. Upper Abdomen: Negative visible noncontrast liver, spleen, pancreas, stomach, adrenal glands. No upper abdominal free air or free fluid. Musculoskeletal: Lower lumbar multilevel posterior spinal fusion hardware. Underlying advanced chronic spinal degeneration. No acute or suspicious osseous lesion. IMPRESSION: 1. Small layering pleural effusions with multifocal upper lobe predominant peribronchial ground-glass opacity. Favor multilobar bronchopneumonia. Atypical appearance of pulmonary edema felt less likely. 2. Aortic Atherosclerosis (ICD10-I70.0). Calcified coronary artery atherosclerosis. No cardiomegaly. Electronically Signed   By: Odessa Fleming M.D.   On: 01/03/2023 11:51   DG Chest 2 View Result Date: 01/03/2023 CLINICAL DATA:  77 year old female with history of shortness of breath. EXAM: CHEST - 2 VIEW COMPARISON:  Chest x-ray 03/31/2007. FINDINGS: Lung volumes are normal. No confluent consolidative airspace disease. Small bilateral pleural  effusions. Diffuse interstitial prominence and widespread peribronchial cuffing. No pneumothorax. No evidence of pulmonary edema. Heart size is normal. Upper mediastinal contours are within normal limits. Atherosclerotic calcifications are noted in the thoracic aorta. Orthopedic rod and screw fixation hardware in the lower thoracic and upper lumbar spine, incompletely imaged. IMPRESSION: 1. The appearance the chest is concerning for acute bronchitis. 2. Small bilateral pleural effusions. 3. Aortic atherosclerosis. Electronically Signed   By: Trudie Reed M.D.   On: 01/03/2023 08:29    Scheduled Meds:  amLODipine  5 mg Oral QHS   budesonide (PULMICORT) nebulizer solution  0.5 mg Nebulization BID   [START ON 01/05/2023] furosemide  20 mg Oral Once per day on Sunday Tuesday Thursday Saturday   furosemide  40 mg Oral Once per day on Monday Wednesday Friday   heparin  5,000 Units Subcutaneous Q8H   insulin aspart protamine- aspart  20 Units Subcutaneous BID WC   ipratropium-albuterol  3 mL Nebulization TID   levothyroxine  100 mcg Oral QAC breakfast   potassium chloride  40 mEq Oral Q4H   Continuous Infusions:  cefTRIAXone (ROCEPHIN)  IV     doxycycline (VIBRAMYCIN) IV 125 mL/hr at 01/04/23 0304     LOS: 1 day   Hughie Closs, MD [facility]  01/04/2023, 7:56 AM   *Please note that this is a verbal dictation therefore any spelling or grammatical errors are due to the "Dragon Medical One" system interpretation.  Please page via Amion and do not message via secure chat for urgent patient care matters. Secure chat can be used for non urgent patient care matters.  How to contact the [facility] Attending or Consulting provider 7A - 7P or covering provider during after hours 7P -7A, for this patient?  Check the care team in [facility] and look for a) attending/consulting TRH provider listed and b) the [facility] team listed. Page or secure chat 7A-7P. Log into [email or web address] and use 's universal  password to access. If you do not have the password, please contact the hospital operator. Locate the [facility] provider you are looking for under [facility] and page to a number that you can be directly reached. If you still have difficulty reaching the provider, please page the [facility] (Director on Call) for the Hospitalists listed on amion for assistance.

## 2023-01-04 NOTE — Progress Notes (Signed)
  Echocardiogram 2D Echocardiogram has been performed.  Ocie Doyne RDCS 01/04/2023, 2:48 PM

## 2023-01-04 NOTE — Plan of Care (Signed)

## 2023-01-04 NOTE — Evaluation (Signed)
Physical Therapy Evaluation Patient Details Name: Rhonda Clayton MRN: [id number] DOB: November 05, 1945 Today's Date: 01/04/2023  History of Present Illness  Patient is a 77 year old female who presented with worsing shortness of breath and wheezing. Patient was admitted to hospital on 01/03/2023 with acute hypoxic respiratory failure secondary to multifocal pneumonia,hypokalemia, PMH includes but is not limited to: proliferative diabetic retinopathy of R eye, CKK IV, DJD, GERD, HTN, hypothyroidism, lichen sclerosus, cystocele, rectocele, subpical stricture, venous insufficiency, CHF, DM II, B torn RTC, and lumbar surgeries.  Clinical Impression      Pt admitted with above diagnosis.  Pt currently with functional limitations due to the deficits listed below (see PT Problem List). Pt seated in recliner and reported feeling very sleepy and motivated to return to bed. Pt reported needing to void bladder. Pt required S and min cues for transfer tasks, close S and min cues for gait tasks in personal room 20 feet with no AD and mod I for sit to supine. Pt reported no pain or dizziness, mild SOB and cough noted pt O2 saturation >/= 93% on RA. Pt left in bed and all needs in place. Pt reports will have some family assist through the weekend. Pt will benefit from continued therapy services in home setting. Pt will benefit from acute skilled PT to increase their independence and safety with mobility to allow discharge.       If plan is discharge home, recommend the following: A little help with walking and/or transfers;A little help with bathing/dressing/bathroom;Assist for transportation;Help with stairs or ramp for entrance;Assistance with cooking/housework   Can travel by private vehicle        Equipment Recommendations None recommended by PT  Recommendations for Other Services       Functional Status Assessment Patient has had a recent decline in their functional status and demonstrates the ability to  make significant improvements in function in a reasonable and predictable amount of time.     Precautions / Restrictions Precautions Precautions: Fall Precaution Comments: monitor O2 Restrictions Weight Bearing Restrictions Per Provider Order: No      Mobility  Bed Mobility Overal bed mobility: Modified Independent                  Transfers Overall transfer level: Needs assistance Equipment used: None Transfers: Sit to/from Stand Sit to Stand: Supervision           General transfer comment: min cues for safety for recliner, bed and commode transfers    Ambulation/Gait Ambulation/Gait assistance: Supervision Gait Distance (Feet): 20 Feet Assistive device: None Gait Pattern/deviations: Step-through pattern, Wide base of support Gait velocity: decreased     General Gait Details: intermittent use of objects in room for stability with recliner to commode and commdoe to bed for gait min cues and No AD lateral sway noted no overt LOB  Stairs            Wheelchair Mobility     Tilt Bed    Modified Rankin (Stroke Patients Only)       Balance Overall balance assessment: Mild deficits observed, not formally tested                                           Pertinent Vitals/Pain      Home Living Family/patient expects to be discharged to:: Private residence Living Arrangements: Alone Available Help at  Discharge: Family Type of Home: House Home Access: Stairs to enter Entrance Stairs-Rails: None Secretary/administrator of Steps: 3   Home Layout: One level Home Equipment: Shower seat - built in      Prior Function Prior Level of Function : Independent/Modified Independent             Mobility Comments: IND no AD for all ADLs, self care tasks and IADLs       Extremity/Trunk Assessment   Upper Extremity Assessment Upper Extremity Assessment: Overall WFL for tasks assessed    Lower Extremity Assessment Lower  Extremity Assessment: Overall WFL for tasks assessed    Cervical / Trunk Assessment Cervical / Trunk Assessment: Normal  Communication   Communication Communication: No apparent difficulties  Cognition Arousal: Alert Behavior During Therapy: WFL for tasks assessed/performed Overall Cognitive Status: Within Functional Limits for tasks assessed                                          General Comments General comments (skin integrity, edema, etc.): patient was educated on using incentive spirometer during session with good carryover.    Exercises     Assessment/Plan    PT Assessment Patient needs continued PT services  PT Problem List Decreased activity tolerance;Cardiopulmonary status limiting activity       PT Treatment Interventions Gait training;Stair training;Functional mobility training;Therapeutic activities;Therapeutic exercise;Balance training;Neuromuscular re-education;Patient/family education    PT Goals (Current goals can be found in the Care Plan section)  Acute Rehab PT Goals Patient Stated Goal: to be able to do everything I was doing before PT Goal Formulation: With patient Time For Goal Achievement: 01/18/23 Potential to Achieve Goals: Good    Frequency Min 1X/week     Co-evaluation               AM-PAC PT "6 Clicks" Mobility  Outcome Measure Help needed turning from your back to your side while in a flat bed without using bedrails?: None Help needed moving from lying on your back to sitting on the side of a flat bed without using bedrails?: None Help needed moving to and from a bed to a chair (including a wheelchair)?: A Little Help needed standing up from a chair using your arms (e.g., wheelchair or bedside chair)?: A Little Help needed to walk in hospital room?: A Little Help needed climbing 3-5 steps with a railing? : A Lot 6 Click Score: 19    End of Session Equipment Utilized During Treatment: Gait belt Activity  Tolerance: Patient limited by fatigue Patient left: in bed;with call bell/phone within reach;with bed alarm set Nurse Communication: Mobility status PT Visit Diagnosis: Unsteadiness on feet (R26.81);Difficulty in walking, not elsewhere classified (R26.2)    Time: 4098-1191 PT Time Calculation (min) (ACUTE ONLY): 11 min   Charges:   PT Evaluation $PT Eval Low Complexity: 1 Low   PT General Charges $$ ACUTE PT VISIT: 1 Visit         Johnny Bridge, PT Acute Rehab   Jacqualyn Posey 01/04/2023, 12:42 PM

## 2023-01-04 NOTE — Plan of Care (Signed)
  Problem: Education: Goal: Knowledge of General Education information will improve Description: Including pain rating scale, medication(s)/side effects and non-pharmacologic comfort measures Outcome: Progressing   Problem: Health Behavior/Discharge Planning: Goal: Ability to manage health-related needs will improve Outcome: Progressing   Problem: Clinical Measurements: Goal: Ability to maintain clinical measurements within normal limits will improve Outcome: Progressing Goal: Will remain free from infection Outcome: Progressing Goal: Diagnostic test results will improve Outcome: Progressing Goal: Respiratory complications will improve Outcome: Progressing Goal: Cardiovascular complication will be avoided Outcome: Progressing   Problem: Activity: Goal: Risk for activity intolerance will decrease Outcome: Progressing   Problem: Nutrition: Goal: Adequate nutrition will be maintained Outcome: Progressing   Problem: Coping: Goal: Level of anxiety will decrease Outcome: Progressing   Problem: Elimination: Goal: Will not experience complications related to bowel motility Outcome: Progressing Goal: Will not experience complications related to urinary retention Outcome: Progressing   Problem: Pain Management: Goal: General experience of comfort will improve Outcome: Progressing   Problem: Safety: Goal: Ability to remain free from injury will improve Outcome: Progressing   Problem: Skin Integrity: Goal: Risk for impaired skin integrity will decrease Outcome: Progressing   Problem: Activity: Goal: Ability to tolerate increased activity will improve Outcome: Progressing   Problem: Clinical Measurements: Goal: Ability to maintain a body temperature in the normal range will improve Outcome: Progressing   Problem: Respiratory: Goal: Ability to maintain adequate ventilation will improve Outcome: Progressing Goal: Ability to maintain a clear airway will improve Outcome:  Progressing   Problem: Education: Goal: Ability to describe self-care measures that may prevent or decrease complications (Diabetes Survival Skills Education) will improve Outcome: Progressing Goal: Individualized Educational Video(s) Outcome: Progressing   Problem: Coping: Goal: Ability to adjust to condition or change in health will improve Outcome: Progressing   Problem: Fluid Volume: Goal: Ability to maintain a balanced intake and output will improve Outcome: Progressing   Problem: Health Behavior/Discharge Planning: Goal: Ability to identify and utilize available resources and services will improve Outcome: Progressing Goal: Ability to manage health-related needs will improve Outcome: Progressing   Problem: Metabolic: Goal: Ability to maintain appropriate glucose levels will improve Outcome: Progressing   Problem: Nutritional: Goal: Maintenance of adequate nutrition will improve Outcome: Progressing Goal: Progress toward achieving an optimal weight will improve Outcome: Progressing   Problem: Skin Integrity: Goal: Risk for impaired skin integrity will decrease Outcome: Progressing   Problem: Tissue Perfusion: Goal: Adequacy of tissue perfusion will improve Outcome: Progressing

## 2023-01-04 NOTE — Evaluation (Signed)
Occupational Therapy Evaluation Patient Details Name: Rhonda Clayton MRN: [id number] DOB: [birth year] Today's Date: 01/04/2023   History of Present Illness Patient is a 77 year old female who presented with worsing shortness of breath and wheezing. Patient was admitted with acute hypoxic respiratory failure secondary to multifocal pneumonia,hypokalemia, PMH: proliferative diabetic retinopathy of R eye   Clinical Impression   Patient is a 78 year old female who was admitted for above. Patient was living at home alone with no AD prior level. Patient was supervision during session with poor activity tolerance during session and increased need for supplemental O2 during session. Patient plans to d/c home with family support and [facility] services. Patient would continue to benefit from skilled OT services at this time while admitted and after d/c to address noted deficits in order to improve overall safety and independence in ADLs.         If plan is discharge home, recommend the following: A little help with walking and/or transfers;A little help with bathing/dressing/bathroom;Assistance with cooking/housework;Direct supervision/assist for medications management;Assist for transportation;Help with stairs or ramp for entrance;Direct supervision/assist for financial management    Functional Status Assessment  Patient has had a recent decline in their functional status and demonstrates the ability to make significant improvements in function in a reasonable and predictable amount of time.  Equipment Recommendations  None recommended by OT       Precautions / Restrictions Precautions Precautions: Fall Precaution Comments: monitor O2 Restrictions Weight Bearing Restrictions Per Provider Order: No      Mobility Bed Mobility Overal bed mobility: Modified Independent                  Transfers Overall transfer level: Needs assistance   Transfers: Sit to/from Stand Sit to Stand:  Supervision                  Balance Overall balance assessment: Mild deficits observed, not formally tested           ADL either performed or assessed with clinical judgement   ADL Overall ADL's : Needs assistance/impaired Eating/Feeding: Modified independent;Sitting   Grooming: Standing;Oral care;Wash/dry face Grooming Details (indicate cue type and reason): at sink with incresed time. Upper Body Bathing: Set up;Sitting   Lower Body Bathing: Sitting/lateral leans;Set up Lower Body Bathing Details (indicate cue type and reason): simulated reaching task Upper Body Dressing : Set up;Sitting   Lower Body Dressing: Set up;Sitting/lateral leans   Toilet Transfer: Supervision/safety;Ambulation Toilet Transfer Details (indicate cue type and reason): no AD with increased time. Toileting- Clothing Manipulation and Hygiene: Set up               Vision Baseline Vision/History: 1 Wears glasses Vision Assessment?: Wears glasses for reading            Pertinent Vitals/Pain Pain Assessment Pain Assessment: No/denies pain     Extremity/Trunk Assessment Upper Extremity Assessment Upper Extremity Assessment: Overall WFL for tasks assessed   Lower Extremity Assessment Lower Extremity Assessment: Defer to PT evaluation   Cervical / Trunk Assessment Cervical / Trunk Assessment: Normal      Cognition Arousal: Alert Behavior During Therapy: WFL for tasks assessed/performed Overall Cognitive Status: Within Functional Limits for tasks assessed               General Comments  patient was educated on using incentive spirometer during session with good carryover.            Home Living Family/patient expects to be discharged  to:: Private residence Living Arrangements: Alone   Type of Home: House Home Access: Stairs to enter Secretary/administrator of Steps: 3 Entrance Stairs-Rails: None Home Layout: One level     Bathroom Shower/Tub: Health and safety inspector: Shower seat - built in          Prior Functioning/Environment Prior Level of Function : Independent/Modified Independent                        OT Problem List: Decreased activity tolerance;Impaired balance (sitting and/or standing);Decreased coordination;Decreased safety awareness;Decreased knowledge of precautions;Decreased knowledge of use of DME or AE;Cardiopulmonary status limiting activity      OT Treatment/Interventions: Self-care/ADL training;Therapeutic exercise;DME and/or AE instruction;Therapeutic activities;Patient/family education;Balance training    OT Goals(Current goals can be found in the care plan section) Acute Rehab OT Goals Patient Stated Goal: to go back home OT Goal Formulation: With patient Time For Goal Achievement: 01/18/23 Potential to Achieve Goals: Fair  OT Frequency: Min 1X/week       AM-PAC OT "6 Clicks" Daily Activity     Outcome Measure Help from another person eating meals?: None Help from another person taking care of personal grooming?: A Little Help from another person toileting, which includes using toliet, bedpan, or urinal?: A Little Help from another person bathing (including washing, rinsing, drying)?: A Little Help from another person to put on and taking off regular upper body clothing?: A Little Help from another person to put on and taking off regular lower body clothing?: A Little 6 Click Score: 19   End of Session Equipment Utilized During Treatment: Oxygen Nurse Communication: Mobility status  Activity Tolerance: Patient tolerated treatment well Patient left: in chair;with call bell/phone within reach;with chair alarm set  OT Visit Diagnosis: Other abnormalities of gait and mobility (R26.89);Unsteadiness on feet (R26.81)                Time: 1610-9604 OT Time Calculation (min): 37 min Charges:  OT General Charges $OT Visit: 1 Visit OT Evaluation $OT Eval Low Complexity: 1 Low OT  Treatments $Self Care/Home Management : 8-22 mins  Rosalio Loud, MS Acute Rehabilitation Department Office# [phone]   Selinda Flavin 01/04/2023, 9:33 AM

## 2023-01-05 ENCOUNTER — Other Ambulatory Visit (HOSPITAL_COMMUNITY): Payer: Self-pay

## 2023-01-05 DIAGNOSIS — J9601 Acute respiratory failure with hypoxia: Secondary | ICD-10-CM | POA: Insufficient documentation

## 2023-01-05 DIAGNOSIS — J189 Pneumonia, unspecified organism: Secondary | ICD-10-CM | POA: Diagnosis not present

## 2023-01-05 LAB — CBC WITH DIFFERENTIAL/PLATELET
Abs Immature Granulocytes: 0.04 10*3/uL (ref 0.00–0.07)
Basophils Absolute: 0.1 10*3/uL (ref 0.0–0.1)
Basophils Relative: 2 %
Eosinophils Absolute: 0.4 10*3/uL (ref 0.0–0.5)
Eosinophils Relative: 5 %
HCT: 29.1 % — ABNORMAL LOW (ref 36.0–46.0)
Hemoglobin: 8.9 g/dL — ABNORMAL LOW (ref 12.0–15.0)
Immature Granulocytes: 1 %
Lymphocytes Relative: 24 %
Lymphs Abs: 1.7 10*3/uL (ref 0.7–4.0)
MCH: 28.2 pg (ref 26.0–34.0)
MCHC: 30.6 g/dL (ref 30.0–36.0)
MCV: 92.1 fL (ref 80.0–100.0)
Monocytes Absolute: 0.9 10*3/uL (ref 0.1–1.0)
Monocytes Relative: 12 %
Neutro Abs: 4 10*3/uL (ref 1.7–7.7)
Neutrophils Relative %: 56 %
Platelets: 376 10*3/uL (ref 150–400)
RBC: 3.16 MIL/uL — ABNORMAL LOW (ref 3.87–5.11)
RDW: 15.3 % (ref 11.5–15.5)
WBC: 7.1 10*3/uL (ref 4.0–10.5)
nRBC: 0 % (ref 0.0–0.2)

## 2023-01-05 LAB — BASIC METABOLIC PANEL
Anion gap: 10 (ref 5–15)
BUN: 52 mg/dL — ABNORMAL HIGH (ref 8–23)
CO2: 16 mmol/L — ABNORMAL LOW (ref 22–32)
Calcium: 8.5 mg/dL — ABNORMAL LOW (ref 8.9–10.3)
Chloride: 113 mmol/L — ABNORMAL HIGH (ref 98–111)
Creatinine, Ser: 3.38 mg/dL — ABNORMAL HIGH (ref 0.44–1.00)
GFR, Estimated: 13 mL/min — ABNORMAL LOW (ref >60)
Glucose, Bld: 88 mg/dL (ref 70–99)
Potassium: 3.4 mmol/L — ABNORMAL LOW (ref 3.5–5.1)
Sodium: 139 mmol/L (ref 135–145)

## 2023-01-05 LAB — GLUCOSE, CAPILLARY
Glucose-Capillary: 71 mg/dL (ref 70–99)
Glucose-Capillary: 124 mg/dL — ABNORMAL HIGH (ref 70–99)
Glucose-Capillary: 211 mg/dL — ABNORMAL HIGH (ref 70–99)

## 2023-01-05 MED ORDER — AMOXICILLIN-POT CLAVULANATE 500-125 MG PO TABS
1.0000 | ORAL_TABLET | Freq: Two times a day (BID) | ORAL | 0 refills | Status: AC
  Filled 2023-01-05: qty 10, 5d supply, fill #0

## 2023-01-05 MED ORDER — IPRATROPIUM-ALBUTEROL 0.5-2.5 (3) MG/3ML IN SOLN
3.0000 mL | Freq: Two times a day (BID) | RESPIRATORY_TRACT | Status: DC
  Administered 2023-01-05: 3 mL via RESPIRATORY_TRACT
  Filled 2023-01-05: qty 3

## 2023-01-05 MED ORDER — POTASSIUM CHLORIDE CRYS ER 20 MEQ PO TBCR
40.0000 meq | EXTENDED_RELEASE_TABLET | ORAL | Status: DC
  Administered 2023-01-05: 40 meq via ORAL
  Filled 2023-01-05: qty 2

## 2023-01-05 NOTE — TOC Transition Note (Signed)
Transition of Care [facility]) - Discharge Note   Patient Details  Name: Rhonda Clayton MRN: [id number] Date of Birth: [birth year]  [facility] of [facility] CM/SW Contact:  Lanier Clam, RN Phone Number: 01/05/2023, 10:52 AM   Clinical Narrative: d/c home no preference for [facility] agency or [facility] dme-[facility];[employer] rollator to deliver to rm prior d/c. No further CM needs.      Final next level of care: Home w Home Health Services Barriers to Discharge: No Barriers Identified   Patient Goals and CMS Choice Patient states their goals for this hospitalization and ongoing recovery are:: Home CMS Medicare.gov Compare Post Acute Care list provided to:: Patient Choice offered to / list presented to : Patient [place] ownership interest in [facility].provided to:: Patient    Discharge Placement                       Discharge Plan and Services Additional resources added to the After Visit Summary for     Discharge Planning Services: CM Consult Post Acute Care Choice: Home Health, Durable Medical Equipment          DME Arranged: Walker rolling with seat DME Agency: [employer] Date DME Agency Contacted: 01/05/23 Time DME Agency Contacted: 1048 Representative spoke with at DME Agency: Vaughan Basta HH Arranged: PT, OT HH Agency: [facility] Date HH Agency Contacted: 01/05/23 Time HH Agency Contacted: 1052 Representative spoke with at [facility] Agency: Adele Dan  Social Drivers of Health (SDOH) Interventions SDOH Screenings   Food Insecurity: No Food Insecurity (01/03/2023)  Housing: Low Risk  (01/03/2023)  Transportation Needs: No Transportation Needs (01/03/2023)  Utilities: Not At Risk (01/03/2023)  Tobacco Use: Low Risk  (01/03/2023)     Readmission Risk Interventions     No data to display

## 2023-01-05 NOTE — Discharge Summary (Signed)
Physician Discharge Summary  Rhonda Clayton:[id number] DOB: 1945/06/12 DOA: 01/03/2023  PCP: Chilton Greathouse, MD  Admit date: 01/03/2023 Discharge date: 01/05/2023 30 Day Unplanned Readmission Risk Score    Flowsheet Row ED to Hosp-Admission (Current) from 01/03/2023 in [facility] 4TH FLOOR PROGRESSIVE CARE AND UROLOGY  30 Day Unplanned Readmission Risk Score (%) 15.78 Filed at 01/05/2023 0801       This score is the patient's risk of an unplanned readmission within 30 days of being discharged (0 -100%). The score is based on dignosis, age, lab data, medications, orders, and past utilization.   Low:  0-14.9   Medium: 15-21.9   High: 22-29.9   Extreme: 30 and above          Admitted From: Home Disposition: Home  Recommendations for Outpatient Follow-up:  Follow up with PCP in 1-2 weeks Please obtain BMP/CBC in one week Follow-up with your nephrologist in 1 to 2 weeks Please follow up with your PCP on the following pending results: Unresulted Labs (From admission, onward)     Start     Ordered   01/05/23 0500  Basic metabolic panel  Daily,   R      01/04/23 0849              Home Health: Yes Equipment/Devices: None  Discharge Condition: Stable CODE STATUS: Full code Diet recommendation: Renal/cardiac/diabetic  Subjective: Seen and examined, feels much better, other than weakness, no complaint.  No shortness of breath.  Agreeable with discharging home today.  Brief/Interim Summary: Rhonda Clayton is a 77 y.o. female with medical history significant of anemia renal disease, stage IV CKD, cystocele, rectocele, DJD, subpial stricture, GERD, hypertension, hyperlipidemia, hypothyroidism, lichen sclerosis, osteopenia, right bundle branch block, rectocele, stress incontinence, dark rotator cuff, type 2 diabetes mellitus, venous insufficiency, left vitreous hemorrhage who came into the emergency department due to progressively worsening dyspnea, orthopnea and wheezing.   She was seen at an urgent care on Monday, prescribed albuterol inhaler and an antibiotic.  She felt slightly better, but in the last few days symptoms have become progressively worse.  No sick contacts or travel history.  Upon arrival to ED, she was fairly hemodynamically stable but slightly hypoxic requiring 2 L of oxygen. chest radiograph concerning for acute bronchitis.  There is small bilateral pleural effusions, aortic atherosclerosis.  CT chest without contrast showing small layering pleural effusions with multifocal upper lobe predominant peribronchial groundglass opacity.  Favorability of lobar pneumonia bronchopneumonia.  Atypical appearance of pulmonary edema felt less likely.  Aortic and coronary artery atherosclerosis.  No cardiomegaly.  She was admitted in the hospital service for the following, details as below.  Acute hypoxic respiratory failure secondary to multifocal pneumonia, POA: Required 2 L of oxygen initially, has been weaned to room air now.  Chest x-ray consistent with multifocal pneumonia.  Was treated with Rocephin and Zithromax.  Tested negative for COVID, flu and RSV.  Much better and stable for discharge, will discharge on 5 more days of renally dosed Augmentin.   Hypokalemia: Will replenish before discharge today.   Acute on chronic diastolic congestive heart failure: Clinically appears euvolemic but did have crackles and BNP elevated up to 700 but no baseline available.  She did have some acute exacerbation, we resumed her oral Lasix and avoided excessive diuretics due to elevated creatinine, however today she feels well, I did not hear any crackles.  She is back to baseline and she is not hypoxic.  Will discharge on home  dose of Lasix.  CKD stage IV: Per patient, her baseline creatinine runs between 2.9-3.2.  Currently 3.4, not far from her baseline.   Type 2 diabetes mellitus: Hemoglobin A1c 7.9.  Blood sugar controlled, resume home medications.   Essential  hypertension: Blood pressure controlled.  Continue home amlodipine but hold Micardis.   Acquired hypothyroidism: Continue Synthroid.   Proliferative diabetic retinopathy of right eye [facility]) Follow-up with ophthalmology as an outpatient.     Prolonged QT interval Keep electrolytes optimized. Avoid QT prolonging meds as possible.  Discharge plan was discussed with patient and/or family member and they verbalized understanding and agreed with it.  Discharge Diagnoses:  Principal Problem:   Multifocal pneumonia Active Problems:   DM (diabetes mellitus) (HCC)   HTN (hypertension)   Hypothyroid   Slow transit constipation   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)   Prolonged QT interval   CKD (chronic kidney disease), stage IV (HCC)   CHF with left ventricular diastolic dysfunction, NYHA class 1 (HCC)   Acute respiratory failure with hypoxia (HCC)    Discharge Instructions   Allergies as of 01/05/2023       Reactions   Coffee Flavoring Agent (non-screening) Other (See Comments)   Sick on stomach, sneezing, backed up   Azithromycin Other (See Comments)   Molds & Smuts    Allergy test showed    Pneumococcal Vac Polyvalent Other (See Comments)   Large site reaction    Cephalexin Other (See Comments)   Yeast infection   Ciprofloxacin Other (See Comments)   tendinitis   Oatmeal Other (See Comments)   Sneezing and drainage   Sulfa Antibiotics Other (See Comments)   Yeast infection        Medication List     STOP taking these medications    amoxicillin 875 MG tablet Commonly known as: AMOXIL   telmisartan-hydrochlorothiazide 80-25 MG tablet Commonly known as: MICARDIS HCT       TAKE these medications    albuterol 108 (90 Base) MCG/ACT inhaler Commonly known as: VENTOLIN HFA Inhale 2 puffs into the lungs 3 (three) times daily as needed for wheezing or shortness of breath.   amLODipine 2.5 MG tablet Commonly known as: NORVASC Take 5 mg by mouth  at bedtime.   amoxicillin-clavulanate 500-125 MG tablet Commonly known as: Augmentin Take 1 tablet by mouth 2 (two) times daily for 5 days.   betamethasone dipropionate 0.05 % cream Apply 1 Application topically as needed (skin condition).   furosemide 40 MG tablet Commonly known as: LASIX Take 20-40 mg by mouth See admin instructions. Take 40 by mouth once daily on Monday, Wednesday, Friday. Take 20 mg once daily on Tuesday, Thursday, Saturday, and Sunday.   MECLIZINE HCL PO Take 0.5 tablets by mouth as needed (dizziness).   NovoLOG Mix 70/30 FlexPen (70-30) 100 UNIT/ML FlexPen Generic drug: insulin aspart protamine - aspart Inject 20 Units into the skin in the morning and at bedtime.   Synthroid 100 MCG tablet Generic drug: levothyroxine Take 100 mcg by mouth daily before breakfast.   TYLENOL PO Take 1 tablet by mouth daily as needed (pain).               Durable Medical Equipment  (From admission, onward)           Start     Ordered   01/05/23 0931  For home use only DME 4 wheeled rolling walker with seat  Once       Question:  Patient  needs a walker to treat with the following condition  Answer:  Difficulty in walking, not elsewhere classified   01/05/23 0931            Follow-up Information     Avva, Ravisankar, MD Follow up in 1 week(s).   Specialty: Internal Medicine Contact information: [address] [place] [address] [phone]                Allergies  Allergen Reactions   Coffee Flavoring Agent (Non-Screening) Other (See Comments)    Sick on stomach, sneezing, backed up   Azithromycin Other (See Comments)   Molds & Smuts     Allergy test showed    Pneumococcal Vac Polyvalent Other (See Comments)    Large site reaction    Cephalexin Other (See Comments)    Yeast infection   Ciprofloxacin Other (See Comments)    tendinitis   Oatmeal Other (See Comments)    Sneezing and drainage   Sulfa Antibiotics Other (See  Comments)    Yeast infection    Consultations: None   Procedures/Studies: ECHOCARDIOGRAM COMPLETE Result Date: 01/04/2023    ECHOCARDIOGRAM REPORT   Patient Name:   SHERLEEN PANCIERA Date of Exam: 01/04/2023 Medical Rec #:  [id number]       Height:       61.5 in Accession #:    [id number]      Weight:       203.0 lb Date of Birth:  1945-06-13       BSA:          1.912 m Patient Age:    77 years        BP:           135/62 mmHg Patient Gender: F               HR:           71 bpm. Exam Location:  Inpatient Procedure: 2D Echo, Cardiac Doppler and Color Doppler Indications:    Dyspnea  History:        Patient has prior history of Echocardiogram examinations, most                 recent 08/28/2020. Arrythmias:RBBB; Risk Factors:Hypertension,                 Diabetes and Dyslipidemia. Chronic kidney disease.  Sonographer:    Vern Claude Referring Phys: [id number] DAVID MANUEL ORTIZ IMPRESSIONS  1. Left ventricular ejection fraction, by estimation, is 55%. The left ventricle has normal function. Left ventricular endocardial border not optimally defined to evaluate regional wall motion. Left ventricular diastolic parameters are consistent with Grade II diastolic dysfunction (pseudonormalization).  2. Right ventricular systolic function is normal. The right ventricular size is normal. Tricuspid regurgitation signal is inadequate for assessing PA pressure.  3. Left atrial size was mildly dilated.  4. The mitral valve is degenerative. Mild mitral valve regurgitation. No evidence of mitral stenosis. Moderate mitral annular calcification.  5. The aortic valve is tricuspid. There is moderate calcification of the aortic valve. Aortic valve regurgitation is not visualized. Mild aortic valve stenosis. Aortic valve mean gradient measures 11.0 mmHg.  6. The inferior vena cava is normal in size with <50% respiratory variability, suggesting right atrial pressure of 8 mmHg. FINDINGS  Left Ventricle: Left ventricular ejection  fraction, by estimation, is 55%. The left ventricle has normal function. Left ventricular endocardial border not optimally defined to evaluate regional wall motion. The left ventricular internal cavity  size was normal in size. There is no left ventricular hypertrophy. Left ventricular diastolic parameters are consistent with Grade II diastolic dysfunction (pseudonormalization). Right Ventricle: The right ventricular size is normal. No increase in right ventricular wall thickness. Right ventricular systolic function is normal. Tricuspid regurgitation signal is inadequate for assessing PA pressure. Left Atrium: Left atrial size was mildly dilated. Right Atrium: Right atrial size was normal in size. Pericardium: There is no evidence of pericardial effusion. Mitral Valve: The mitral valve is degenerative in appearance. Moderate mitral annular calcification. Mild mitral valve regurgitation. No evidence of mitral valve stenosis. MV peak gradient, 7.2 mmHg. The mean mitral valve gradient is 3.0 mmHg. Tricuspid Valve: The tricuspid valve is normal in structure. Tricuspid valve regurgitation is not demonstrated. Aortic Valve: The aortic valve is tricuspid. There is moderate calcification of the aortic valve. Aortic valve regurgitation is not visualized. Mild aortic stenosis is present. Aortic valve mean gradient measures 11.0 mmHg. Aortic valve peak gradient measures 18.6 mmHg. Aortic valve area, by VTI measures 1.13 cm. Pulmonic Valve: The pulmonic valve was normal in structure. Pulmonic valve regurgitation is not visualized. Aorta: The aortic root is normal in size and structure. Venous: The inferior vena cava is normal in size with less than 50% respiratory variability, suggesting right atrial pressure of 8 mmHg. IAS/Shunts: No atrial level shunt detected by color flow Doppler.  LEFT VENTRICLE PLAX 2D LVIDd:         5.00 cm      Diastology LVIDs:         3.20 cm      LV e' medial:    5.92 cm/s LV PW:         0.80 cm       LV E/e' medial:  18.1 LV IVS:        0.80 cm      LV e' lateral:   9.97 cm/s LVOT diam:     1.90 cm      LV E/e' lateral: 10.7 LV SV:         45 LV SV Index:   24 LVOT Area:     2.84 cm  LV Volumes (MOD) LV vol d, MOD A2C: 112.0 ml LV vol d, MOD A4C: 163.0 ml LV vol s, MOD A2C: 42.3 ml LV vol s, MOD A4C: 69.7 ml LV SV MOD A2C:     69.7 ml LV SV MOD A4C:     163.0 ml LV SV MOD BP:      91.3 ml RIGHT VENTRICLE             IVC RV Basal diam:  3.70 cm     IVC diam: 1.80 cm RV Mid diam:    2.80 cm RV S prime:     14.30 cm/s TAPSE (M-mode): 2.1 cm LEFT ATRIUM             Index        RIGHT ATRIUM          Index LA diam:        4.10 cm 2.14 cm/m   RA Area:     9.22 cm LA Vol (A2C):   51.2 ml 26.77 ml/m  RA Volume:   15.80 ml 8.26 ml/m LA Vol (A4C):   89.3 ml 46.69 ml/m LA Biplane Vol: 71.1 ml 37.18 ml/m  AORTIC VALVE                     PULMONIC VALVE AV Area (Vmax):  0.94 cm      PV Vmax:       1.62 m/s AV Area (Vmean):   0.87 cm      PV Peak grad:  10.5 mmHg AV Area (VTI):     1.13 cm AV Vmax:           215.50 cm/s AV Vmean:          136.000 cm/s AV VTI:            0.402 m AV Peak Grad:      18.6 mmHg AV Mean Grad:      11.0 mmHg LVOT Vmax:         71.70 cm/s LVOT Vmean:        41.700 cm/s LVOT VTI:          0.160 m LVOT/AV VTI ratio: 0.40  AORTA Ao Root diam: 3.00 cm Ao Asc diam:  2.80 cm MITRAL VALVE MV Area (PHT): 4.39 cm     SHUNTS MV Area VTI:   1.69 cm     Systemic VTI:  0.16 m MV Peak grad:  7.2 mmHg     Systemic Diam: 1.90 cm MV Mean grad:  3.0 mmHg MV Vmax:       1.35 m/s MV Vmean:      78.0 cm/s MV Decel Time: 173 msec MV E velocity: 107.00 cm/s MV A velocity: 91.20 cm/s MV E/A ratio:  1.17 Dalton McleanMD Electronically signed by Wilfred Lacy Signature Date/Time: 01/04/2023/3:03:04 PM    Final    CT Chest Wo Contrast Result Date: 01/03/2023 CLINICAL DATA:  77 year old female with shortness of breath. Orthopnea. Wheezing. EXAM: CT CHEST WITHOUT CONTRAST TECHNIQUE: Multidetector CT imaging  of the chest was performed following the standard protocol without IV contrast. RADIATION DOSE REDUCTION: This exam was performed according to the departmental dose-optimization program which includes automated exposure control, adjustment of the mA and/or kV according to patient size and/or use of iterative reconstruction technique. COMPARISON:  Chest radiographs 0814 hours today. FINDINGS: Cardiovascular: Calcified aortic atherosclerosis. Calcified coronary artery atherosclerosis (series 2, image 31). No cardiomegaly or pericardial effusion. Mediastinum/Nodes: Negative noncontrast appearance. Lungs/Pleura: Layering bilateral pleural effusions with simple fluid density are small volume. Major airways are patent. Right upper lobe multifocal peribronchial areas of ground-glass opacity, largest on series 4, image 55. A few small areas are also present in the contralateral left upper lobe (series 4, image 38). The right middle lobe is affected on sagittal image 49. The lower lobes are relatively spared. No consolidation. There is mild pulmonary septal thickening only at the lung bases. Upper Abdomen: Negative visible noncontrast liver, spleen, pancreas, stomach, adrenal glands. No upper abdominal free air or free fluid. Musculoskeletal: Lower lumbar multilevel posterior spinal fusion hardware. Underlying advanced chronic spinal degeneration. No acute or suspicious osseous lesion. IMPRESSION: 1. Small layering pleural effusions with multifocal upper lobe predominant peribronchial ground-glass opacity. Favor multilobar bronchopneumonia. Atypical appearance of pulmonary edema felt less likely. 2. Aortic Atherosclerosis (ICD10-I70.0). Calcified coronary artery atherosclerosis. No cardiomegaly. Electronically Signed   By: Odessa Fleming M.D.   On: 01/03/2023 11:51   DG Chest 2 View Result Date: 01/03/2023 CLINICAL DATA:  77 year old female with history of shortness of breath. EXAM: CHEST - 2 VIEW COMPARISON:  Chest x-ray  03/31/2007. FINDINGS: Lung volumes are normal. No confluent consolidative airspace disease. Small bilateral pleural effusions. Diffuse interstitial prominence and widespread peribronchial cuffing. No pneumothorax. No evidence of pulmonary edema. Heart size is normal. Upper mediastinal contours are within normal  limits. Atherosclerotic calcifications are noted in the thoracic aorta. Orthopedic rod and screw fixation hardware in the lower thoracic and upper lumbar spine, incompletely imaged. IMPRESSION: 1. The appearance the chest is concerning for acute bronchitis. 2. Small bilateral pleural effusions. 3. Aortic atherosclerosis. Electronically Signed   By: Trudie Reed M.D.   On: 01/03/2023 08:29     Discharge Exam: Vitals:   01/05/23 0357 01/05/23 0745  BP: 136/79   Pulse: 72   Resp: 20   Temp: 97.8 F (36.6 C)   SpO2: 98% 99%   Vitals:   01/04/23 2134 01/04/23 2301 01/05/23 0357 01/05/23 0745  BP: (!) 155/67  136/79   Pulse: 69  72   Resp: 20  20   Temp: 98.3 F (36.8 C)  97.8 F (36.6 C)   TempSrc: Oral     SpO2: 96% 93% 98% 99%  Weight:      Height:        General: Pt is alert, awake, not in acute distress Cardiovascular: RRR, S1/S2 +, no rubs, no gallops Respiratory: CTA bilaterally, no wheezing, no rhonchi Abdominal: Soft, NT, ND, bowel sounds + Extremities: no edema, no cyanosis    The results of significant diagnostics from this hospitalization (including imaging, microbiology, ancillary and laboratory) are listed below for reference.     Microbiology: Recent Results (from the past 240 hours)  Resp panel by RT-PCR (RSV, Flu A&B, Covid) Anterior Nasal Swab     Status: None   Collection Time: 01/03/23  9:25 AM   Specimen: Anterior Nasal Swab  Result Value Ref Range Status   SARS Coronavirus 2 by RT PCR NEGATIVE NEGATIVE Final    Comment: (NOTE) SARS-CoV-2 target nucleic acids are NOT DETECTED.  The SARS-CoV-2 RNA is generally detectable in upper  respiratory specimens during the acute phase of infection. The lowest concentration of SARS-CoV-2 viral copies this assay can detect is 138 copies/mL. A negative result does not preclude SARS-Cov-2 infection and should not be used as the sole basis for treatment or other patient management decisions. A negative result may occur with  improper specimen collection/handling, submission of specimen other than nasopharyngeal swab, presence of viral mutation(s) within the areas targeted by this assay, and inadequate number of viral copies(<138 copies/mL). A negative result must be combined with clinical observations, patient history, and epidemiological information. The expected result is Negative.  Fact Sheet for Patients:  BloggerCourse.com  Fact Sheet for Healthcare Providers:  SeriousBroker.it  This test is no t yet approved or cleared by the [place] FDA and  has been authorized for detection and/or diagnosis of SARS-CoV-2 by FDA under an Emergency Use Authorization (EUA). This EUA will remain  in effect (meaning this test can be used) for the duration of the COVID-19 declaration under Section 564(b)(1) of the Act, 21 U.S.C.section 360bbb-3(b)(1), unless the authorization is terminated  or revoked sooner.       Influenza A by PCR NEGATIVE NEGATIVE Final   Influenza B by PCR NEGATIVE NEGATIVE Final    Comment: (NOTE) The Xpert Xpress SARS-CoV-2/FLU/RSV plus assay is intended as an aid in the diagnosis of influenza from Nasopharyngeal swab specimens and should not be used as a sole basis for treatment. Nasal washings and aspirates are unacceptable for Xpert Xpress SARS-CoV-2/FLU/RSV testing.  Fact Sheet for Patients: BloggerCourse.com  Fact Sheet for Healthcare Providers: SeriousBroker.it  This test is not yet approved or cleared by the [place] FDA and has been  authorized for detection and/or diagnosis of SARS-CoV-2  by FDA under an Emergency Use Authorization (EUA). This EUA will remain in effect (meaning this test can be used) for the duration of the COVID-19 declaration under Section 564(b)(1) of the Act, 21 U.S.C. section 360bbb-3(b)(1), unless the authorization is terminated or revoked.     Resp Syncytial Virus by PCR NEGATIVE NEGATIVE Final    Comment: (NOTE) Fact Sheet for Patients: BloggerCourse.com  Fact Sheet for Healthcare Providers: SeriousBroker.it  This test is not yet approved or cleared by the [place] FDA and has been authorized for detection and/or diagnosis of SARS-CoV-2 by FDA under an Emergency Use Authorization (EUA). This EUA will remain in effect (meaning this test can be used) for the duration of the COVID-19 declaration under Section 564(b)(1) of the Act, 21 U.S.C. section 360bbb-3(b)(1), unless the authorization is terminated or revoked.  Performed at [facility], [address] [place], [place] [address]   Culture, blood (routine x 2)     Status: None (Preliminary result)   Collection Time: 01/03/23  2:55 PM   Specimen: BLOOD RIGHT ARM  Result Value Ref Range Status   Specimen Description   Final    BLOOD RIGHT ARM Performed at [facility], [address], [place], [place] [address]    Special Requests   Final    BOTTLES DRAWN AEROBIC AND ANAEROBIC Blood Culture results may not be optimal due to an inadequate volume of blood received in culture bottles Performed at [facility], [address], [place], [place] [address]    Culture   Final    NO GROWTH 2 DAYS Performed at [facility], [address], [place], [place] [address]    Report Status PENDING  Incomplete  Culture, blood (routine x 2)     Status: None (Preliminary result)   Collection Time: 01/03/23  2:55 PM   Specimen: BLOOD LEFT ARM   Result Value Ref Range Status   Specimen Description   Final    BLOOD LEFT ARM Performed at [facility], [address], [place], [place] [address]    Special Requests   Final    BOTTLES DRAWN AEROBIC AND ANAEROBIC Blood Culture results may not be optimal due to an inadequate volume of blood received in culture bottles Performed at [facility], [address], [place], [place] [address]    Culture   Final    NO GROWTH 2 DAYS Performed at [facility], [address], [place], [place] [address]    Report Status PENDING  Incomplete  Respiratory (~20 pathogens) panel by PCR     Status: None   Collection Time: 01/04/23  9:27 AM   Specimen: Nasopharyngeal Swab; Respiratory  Result Value Ref Range Status   Adenovirus NOT DETECTED NOT DETECTED Final   Coronavirus 229E NOT DETECTED NOT DETECTED Final    Comment: (NOTE) The Coronavirus on the Respiratory Panel, DOES NOT test for the novel  Coronavirus (2019 nCoV)    Coronavirus HKU1 NOT DETECTED NOT DETECTED Final   Coronavirus NL63 NOT DETECTED NOT DETECTED Final   Coronavirus OC43 NOT DETECTED NOT DETECTED Final   Metapneumovirus NOT DETECTED NOT DETECTED Final   Rhinovirus / Enterovirus NOT DETECTED NOT DETECTED Final   Influenza A NOT DETECTED NOT DETECTED Final   Influenza B NOT DETECTED NOT DETECTED Final   Parainfluenza Virus 1 NOT DETECTED NOT DETECTED Final   Parainfluenza Virus 2 NOT DETECTED NOT DETECTED Final   Parainfluenza Virus  3 NOT DETECTED NOT DETECTED Final   Parainfluenza Virus 4 NOT DETECTED NOT DETECTED Final   Respiratory Syncytial Virus NOT DETECTED NOT DETECTED Final   Bordetella pertussis NOT DETECTED NOT DETECTED Final   Bordetella Parapertussis NOT DETECTED NOT DETECTED Final   Chlamydophila pneumoniae NOT DETECTED NOT DETECTED Final   Mycoplasma pneumoniae NOT DETECTED NOT DETECTED Final    Comment: Performed at [facility], [address], [place], [place]  [address]  Expectorated Sputum Assessment w Gram Stain, Rflx to Resp Cult     Status: None   Collection Time: 01/04/23 12:49 PM   Specimen: Expectorated Sputum  Result Value Ref Range Status   Specimen Description EXPECTORATED SPUTUM  Final   Special Requests NONE  Final   Sputum evaluation   Final    THIS SPECIMEN IS ACCEPTABLE FOR SPUTUM CULTURE Performed at [facility], [address] [place], [place] [address]    Report Status 01/04/2023 FINAL  Final  Culture, Respiratory w Gram Stain     Status: None (Preliminary result)   Collection Time: 01/04/23 12:49 PM  Result Value Ref Range Status   Specimen Description   Final    EXPECTORATED SPUTUM Performed at [facility], [address], [place], [place] [address]    Special Requests   Final    NONE Reflexed from [phone] [id number] Performed at [facility], [address] [place], [place] [address]    Gram Stain   Final    FEW WBC PRESENT, PREDOMINANTLY PMN RARE GRAM POSITIVE COCCI Performed at [facility], [address] [place], [place] [address]    Culture PENDING  Incomplete   Report Status PENDING  Incomplete     Labs: BNP (last 3 results) Recent Labs    01/03/23 0925  BNP 709.1*   Basic Metabolic Panel: Recent Labs  Lab 01/03/23 0925 01/04/23 0507 01/05/23 0514  NA 137 136 139  K 3.6 3.1* 3.4*  CL 108 109 113*  CO2 18* 19* 16*  GLUCOSE 233* 125* 88  BUN 58* 56* 52*  CREATININE 3.32* 3.42* 3.38*  CALCIUM 8.8* 8.2* 8.5*   Liver Function Tests: Recent Labs  Lab 01/04/23 0507  AST 16  ALT 16  ALKPHOS 66  BILITOT 0.7  PROT 7.0  ALBUMIN 3.3*   No results for input(s): "LIPASE", "AMYLASE" in the last 168 hours. No results for input(s): "AMMONIA" in the last 168 hours. CBC: Recent Labs  Lab 01/03/23 0925 01/04/23 0507 01/05/23 0514  WBC 8.5 6.1 7.1  NEUTROABS 6.0  --  4.0  HGB 9.4* 8.4* 8.9*  HCT 28.4* 26.9* 29.1*  MCV 87.9 90.6 92.1  PLT  398 363 376   Cardiac Enzymes: No results for input(s): "CKTOTAL", "CKMB", "CKMBINDEX", "TROPONINI" in the last 168 hours. BNP: Invalid input(s): "POCBNP" CBG: Recent Labs  Lab 01/04/23 1124 01/04/23 1638 01/04/23 2133 01/05/23 0346 01/05/23 0756  GLUCAP 185* 190* 129* 71 124*   D-Dimer Recent Labs    01/03/23 0925  DDIMER 2.16*   Hgb A1c Recent Labs    01/03/23 1436  HGBA1C 7.9*   Lipid Profile No results for input(s): "CHOL", "HDL", "LDLCALC", "TRIG", "CHOLHDL", "LDLDIRECT" in the last 72 hours. Thyroid function studies Recent Labs    01/03/23 0925  TSH 2.589   Anemia work up No results for input(s): "VITAMINB12", "FOLATE", "FERRITIN", "TIBC", "IRON", "RETICCTPCT" in the last 72 hours. Urinalysis    Component Value Date/Time   COLORURINE STRAW (A)  01/03/2023 1102   APPEARANCEUR CLEAR 01/03/2023 1102   LABSPEC 1.009 01/03/2023 1102   PHURINE 6.0 01/03/2023 1102   GLUCOSEU 150 (A) 01/03/2023 1102   HGBUR SMALL (A) 01/03/2023 1102   BILIRUBINUR NEGATIVE 01/03/2023 1102   BILIRUBINUR negative 05/02/2015 1724   BILIRUBINUR neg 03/01/2011 1243   KETONESUR NEGATIVE 01/03/2023 1102   PROTEINUR 100 (A) 01/03/2023 1102   UROBILINOGEN 0.2 05/02/2015 1724   UROBILINOGEN 0.2 03/31/2007 0750   NITRITE NEGATIVE 01/03/2023 1102   LEUKOCYTESUR NEGATIVE 01/03/2023 1102   Sepsis Labs Recent Labs  Lab 01/03/23 0925 01/04/23 0507 01/05/23 0514  WBC 8.5 6.1 7.1   Microbiology Recent Results (from the past 240 hours)  Resp panel by RT-PCR (RSV, Flu A&B, Covid) Anterior Nasal Swab     Status: None   Collection Time: 01/03/23  9:25 AM   Specimen: Anterior Nasal Swab  Result Value Ref Range Status   SARS Coronavirus 2 by RT PCR NEGATIVE NEGATIVE Final    Comment: (NOTE) SARS-CoV-2 target nucleic acids are NOT DETECTED.  The SARS-CoV-2 RNA is generally detectable in upper respiratory specimens during the acute phase of infection. The lowest concentration of  SARS-CoV-2 viral copies this assay can detect is 138 copies/mL. A negative result does not preclude SARS-Cov-2 infection and should not be used as the sole basis for treatment or other patient management decisions. A negative result may occur with  improper specimen collection/handling, submission of specimen other than nasopharyngeal swab, presence of viral mutation(s) within the areas targeted by this assay, and inadequate number of viral copies(<138 copies/mL). A negative result must be combined with clinical observations, patient history, and epidemiological information. The expected result is Negative.  Fact Sheet for Patients:  BloggerCourse.com  Fact Sheet for Healthcare Providers:  SeriousBroker.it  This test is no t yet approved or cleared by the [place] FDA and  has been authorized for detection and/or diagnosis of SARS-CoV-2 by FDA under an Emergency Use Authorization (EUA). This EUA will remain  in effect (meaning this test can be used) for the duration of the COVID-19 declaration under Section 564(b)(1) of the Act, 21 U.S.C.section 360bbb-3(b)(1), unless the authorization is terminated  or revoked sooner.       Influenza A by PCR NEGATIVE NEGATIVE Final   Influenza B by PCR NEGATIVE NEGATIVE Final    Comment: (NOTE) The Xpert Xpress SARS-CoV-2/FLU/RSV plus assay is intended as an aid in the diagnosis of influenza from Nasopharyngeal swab specimens and should not be used as a sole basis for treatment. Nasal washings and aspirates are unacceptable for Xpert Xpress SARS-CoV-2/FLU/RSV testing.  Fact Sheet for Patients: BloggerCourse.com  Fact Sheet for Healthcare Providers: SeriousBroker.it  This test is not yet approved or cleared by the [place] FDA and has been authorized for detection and/or diagnosis of SARS-CoV-2 by FDA under an Emergency Use  Authorization (EUA). This EUA will remain in effect (meaning this test can be used) for the duration of the COVID-19 declaration under Section 564(b)(1) of the Act, 21 U.S.C. section 360bbb-3(b)(1), unless the authorization is terminated or revoked.     Resp Syncytial Virus by PCR NEGATIVE NEGATIVE Final    Comment: (NOTE) Fact Sheet for Patients: BloggerCourse.com  Fact Sheet for Healthcare Providers: SeriousBroker.it  This test is not yet approved or cleared by the [place] FDA and has been authorized for detection and/or diagnosis of SARS-CoV-2 by FDA under an Emergency Use Authorization (EUA). This EUA will remain in effect (meaning this test can be used)  for the duration of the COVID-19 declaration under Section 564(b)(1) of the Act, 21 U.S.C. section 360bbb-3(b)(1), unless the authorization is terminated or revoked.  Performed at [facility], [address] [place], [place] [address]   Culture, blood (routine x 2)     Status: None (Preliminary result)   Collection Time: 01/03/23  2:55 PM   Specimen: BLOOD RIGHT ARM  Result Value Ref Range Status   Specimen Description   Final    BLOOD RIGHT ARM Performed at [facility], [address], [place], [place] [address]    Special Requests   Final    BOTTLES DRAWN AEROBIC AND ANAEROBIC Blood Culture results may not be optimal due to an inadequate volume of blood received in culture bottles Performed at [facility], [address] [place], [place] [address]    Culture   Final    NO GROWTH 2 DAYS Performed at [facility], [address], [place], [place] [address]    Report Status PENDING  Incomplete  Culture, blood (routine x 2)     Status: None (Preliminary result)   Collection Time: 01/03/23  2:55 PM   Specimen: BLOOD LEFT ARM  Result Value Ref Range Status   Specimen Description   Final    BLOOD LEFT  ARM Performed at [facility], [address] [place], [place] [address]    Special Requests   Final    BOTTLES DRAWN AEROBIC AND ANAEROBIC Blood Culture results may not be optimal due to an inadequate volume of blood received in culture bottles Performed at [facility], [address] [place], [place] [address]    Culture   Final    NO GROWTH 2 DAYS Performed at [facility], [address], [place], [place] [address]    Report Status PENDING  Incomplete  Respiratory (~20 pathogens) panel by PCR     Status: None   Collection Time: 01/04/23  9:27 AM   Specimen: Nasopharyngeal Swab; Respiratory  Result Value Ref Range Status   Adenovirus NOT DETECTED NOT DETECTED Final   Coronavirus 229E NOT DETECTED NOT DETECTED Final    Comment: (NOTE) The Coronavirus on the Respiratory Panel, DOES NOT test for the novel  Coronavirus (2019 nCoV)    Coronavirus HKU1 NOT DETECTED NOT DETECTED Final   Coronavirus NL63 NOT DETECTED NOT DETECTED Final   Coronavirus OC43 NOT DETECTED NOT DETECTED Final   Metapneumovirus NOT DETECTED NOT DETECTED Final   Rhinovirus / Enterovirus NOT DETECTED NOT DETECTED Final   Influenza A NOT DETECTED NOT DETECTED Final   Influenza B NOT DETECTED NOT DETECTED Final   Parainfluenza Virus 1 NOT DETECTED NOT DETECTED Final   Parainfluenza Virus 2 NOT DETECTED NOT DETECTED Final   Parainfluenza Virus 3 NOT DETECTED NOT DETECTED Final   Parainfluenza Virus 4 NOT DETECTED NOT DETECTED Final   Respiratory Syncytial Virus NOT DETECTED NOT DETECTED Final   Bordetella pertussis NOT DETECTED NOT DETECTED Final   Bordetella Parapertussis NOT DETECTED NOT DETECTED Final   Chlamydophila pneumoniae NOT DETECTED NOT DETECTED Final   Mycoplasma pneumoniae NOT DETECTED NOT DETECTED Final    Comment: Performed at [facility], [address], [place], [place] [address]  Expectorated Sputum Assessment w Gram Stain, Rflx to Resp Cult     Status:  None   Collection Time: 01/04/23 12:49 PM   Specimen: Expectorated Sputum  Result Value Ref Range Status  Specimen Description EXPECTORATED SPUTUM  Final   Special Requests NONE  Final   Sputum evaluation   Final    THIS SPECIMEN IS ACCEPTABLE FOR SPUTUM CULTURE Performed at [facility], [address], [place], [place] [address]    Report Status 01/04/2023 FINAL  Final  Culture, Respiratory w Gram Stain     Status: None (Preliminary result)   Collection Time: 01/04/23 12:49 PM  Result Value Ref Range Status   Specimen Description   Final    EXPECTORATED SPUTUM Performed at [facility], [address], [place], [place] [address]    Special Requests   Final    NONE Reflexed from [id number] Performed at [facility], [address], [place], [place] [address]    Gram Stain   Final    FEW WBC PRESENT, PREDOMINANTLY PMN RARE GRAM POSITIVE COCCI Performed at [facility], [address] [place], [place] [address]    Culture PENDING  Incomplete   Report Status PENDING  Incomplete    FURTHER DISCHARGE INSTRUCTIONS:   Get Medicines reviewed and adjusted: Please take all your medications with you for your next visit with your Primary MD   Laboratory/radiological data: Please request your Primary MD to go over all hospital tests and procedure/radiological results at the follow up, please ask your Primary MD to get all Hospital records sent to his/her office.   In some cases, they will be blood work, cultures and biopsy results pending at the time of your discharge. Please request that your primary care M.D. goes through all the records of your hospital data and follows up on these results.   Also Note the following: If you experience worsening of your admission symptoms, develop shortness of breath, life threatening emergency, suicidal or homicidal thoughts you must seek medical attention immediately by calling 911 or  calling your MD immediately  if symptoms less severe.   You must read complete instructions/literature along with all the possible adverse reactions/side effects for all the Medicines you take and that have been prescribed to you. Take any new Medicines after you have completely understood and accpet all the possible adverse reactions/side effects.    Do not drive when taking Pain medications or sleeping medications (Benzodaizepines)   Do not take more than prescribed Pain, Sleep and Anxiety Medications. It is not advisable to combine anxiety,sleep and pain medications without talking with your primary care practitioner   Special Instructions: If you have smoked or chewed Tobacco  in the last 2 yrs please stop smoking, stop any regular Alcohol  and or any Recreational drug use.   Wear Seat belts while driving.   Please note: You were cared for by a hospitalist during your hospital stay. Once you are discharged, your primary care physician will handle any further medical issues. Please note that NO REFILLS for any discharge medications will be authorized once you are discharged, as it is imperative that you return to your primary care physician (or establish a relationship with a primary care physician if you do not have one) for your post hospital discharge needs so that they can reassess your need for medications and monitor your lab values  Time coordinating discharge: Over 30 minutes  SIGNED:   Hughie Closs, MD  [facility] 01/05/2023, 9:46 AM *Please note that this is a verbal dictation therefore any spelling or grammatical errors are due to the "Dragon Medical One" system interpretation. If 7PM-7AM, please contact night-coverage [email or web address]

## 2023-01-05 NOTE — Progress Notes (Signed)
Physical Therapy Treatment Patient Details Name: Rhonda Clayton MRN: [id number] DOB: Jun 01, 1945 Today's Date: 01/05/2023   History of Present Illness Patient is a 77 year old female who presented with worsing shortness of breath and wheezing. Patient was admitted to hospital on 01/03/2023 with acute hypoxic respiratory failure secondary to multifocal pneumonia,hypokalemia, PMH includes but is not limited to: proliferative diabetic retinopathy of R eye, CKK IV, DJD, GERD, HTN, hypothyroidism, lichen sclerosus, cystocele, rectocele, subpical stricture, venous insufficiency, CHF, DM II, B torn RTC, and lumbar surgeries.    PT Comments  Pt is progressing well with mobility, she tolerated increased ambulation distance of 150' with RW, no loss of balance, SpO2 93% on room air walking, 1/4 dyspnea, VCs for pursed lip breathing. At baseline pt ambulates without an assistive device, she reports feeling more steady with a RW today. Order for rollator placed.    If plan is discharge home, recommend the following: A little help with bathing/dressing/bathroom;Assist for transportation;Help with stairs or ramp for entrance;Assistance with cooking/housework   Can travel by Pension scheme manager (4 wheels)    Recommendations for Other Services       Precautions / Restrictions Precautions Precautions: Fall Precaution Comments: monitor O2; pt denies falls in past 6 months Restrictions Weight Bearing Restrictions Per Provider Order: No     Mobility  Bed Mobility               General bed mobility comments: up at edge of bed    Transfers Overall transfer level: Independent Equipment used: None Transfers: Sit to/from Stand Sit to Stand: Independent                Ambulation/Gait Ambulation/Gait assistance: Supervision Gait Distance (Feet): 150 Feet Assistive device: Rolling walker (2 wheels) Gait Pattern/deviations: Step-through pattern,  Wide base of support Gait velocity: decreased     General Gait Details: steady, no loss of balance, SpO2 93% on room air walking, mild 1/4 dyspnea, VCs for pursed lip breathing   Stairs             Wheelchair Mobility     Tilt Bed    Modified Rankin (Stroke Patients Only)       Balance Overall balance assessment: Modified Independent                                          Cognition Arousal: Alert Behavior During Therapy: WFL for tasks assessed/performed Overall Cognitive Status: Within Functional Limits for tasks assessed                                          Exercises      General Comments        Pertinent Vitals/Pain Pain Assessment Pain Assessment: No/denies pain    Home Living                          Prior Function            PT Goals (current goals can now be found in the care plan section) Acute Rehab PT Goals Patient Stated Goal: to be able to do everything I was doing before PT Goal Formulation: With patient Time For Goal Achievement:  01/18/23 Potential to Achieve Goals: Good Progress towards PT goals: Progressing toward goals    Frequency    Min 1X/week      PT Plan      Co-evaluation              AM-PAC PT "6 Clicks" Mobility   Outcome Measure  Help needed turning from your back to your side while in a flat bed without using bedrails?: None Help needed moving from lying on your back to sitting on the side of a flat bed without using bedrails?: None Help needed moving to and from a bed to a chair (including a wheelchair)?: None Help needed standing up from a chair using your arms (e.g., wheelchair or bedside chair)?: None Help needed to walk in hospital room?: None Help needed climbing 3-5 steps with a railing? : A Little 6 Click Score: 23    End of Session Equipment Utilized During Treatment: Gait belt Activity Tolerance: Patient tolerated treatment well Patient  left: with call bell/phone within reach;in chair Nurse Communication: Mobility status PT Visit Diagnosis: Unsteadiness on feet (R26.81);Difficulty in walking, not elsewhere classified (R26.2)     Time: 0900-0920 PT Time Calculation (min) (ACUTE ONLY): 20 min  Charges:    $Gait Training: 8-22 mins PT General Charges $$ ACUTE PT VISIT: 1 Visit                    Tamala Ser PT 01/05/2023  Acute Rehabilitation Services  Office [phone]

## 2023-01-06 DIAGNOSIS — I130 Hypertensive heart and chronic kidney disease with heart failure and stage 1 through stage 4 chronic kidney disease, or unspecified chronic kidney disease: Secondary | ICD-10-CM | POA: Diagnosis not present

## 2023-01-06 DIAGNOSIS — J9601 Acute respiratory failure with hypoxia: Secondary | ICD-10-CM | POA: Diagnosis not present

## 2023-01-06 DIAGNOSIS — I5033 Acute on chronic diastolic (congestive) heart failure: Secondary | ICD-10-CM | POA: Diagnosis not present

## 2023-01-06 DIAGNOSIS — E46 Unspecified protein-calorie malnutrition: Secondary | ICD-10-CM | POA: Diagnosis not present

## 2023-01-06 DIAGNOSIS — E785 Hyperlipidemia, unspecified: Secondary | ICD-10-CM | POA: Diagnosis not present

## 2023-01-06 DIAGNOSIS — H35371 Puckering of macula, right eye: Secondary | ICD-10-CM | POA: Diagnosis not present

## 2023-01-06 DIAGNOSIS — N393 Stress incontinence (female) (male): Secondary | ICD-10-CM | POA: Diagnosis not present

## 2023-01-06 DIAGNOSIS — E113552 Type 2 diabetes mellitus with stable proliferative diabetic retinopathy, left eye: Secondary | ICD-10-CM | POA: Diagnosis not present

## 2023-01-06 DIAGNOSIS — N184 Chronic kidney disease, stage 4 (severe): Secondary | ICD-10-CM | POA: Diagnosis not present

## 2023-01-06 DIAGNOSIS — K219 Gastro-esophageal reflux disease without esophagitis: Secondary | ICD-10-CM | POA: Diagnosis not present

## 2023-01-06 DIAGNOSIS — E876 Hypokalemia: Secondary | ICD-10-CM | POA: Diagnosis not present

## 2023-01-06 DIAGNOSIS — Z604 Social exclusion and rejection: Secondary | ICD-10-CM | POA: Diagnosis not present

## 2023-01-06 DIAGNOSIS — I700 Atherosclerosis of aorta: Secondary | ICD-10-CM | POA: Diagnosis not present

## 2023-01-06 DIAGNOSIS — J189 Pneumonia, unspecified organism: Secondary | ICD-10-CM | POA: Diagnosis not present

## 2023-01-06 DIAGNOSIS — Z8701 Personal history of pneumonia (recurrent): Secondary | ICD-10-CM | POA: Diagnosis not present

## 2023-01-06 DIAGNOSIS — E1151 Type 2 diabetes mellitus with diabetic peripheral angiopathy without gangrene: Secondary | ICD-10-CM | POA: Diagnosis not present

## 2023-01-06 DIAGNOSIS — D631 Anemia in chronic kidney disease: Secondary | ICD-10-CM | POA: Diagnosis not present

## 2023-01-06 DIAGNOSIS — E1122 Type 2 diabetes mellitus with diabetic chronic kidney disease: Secondary | ICD-10-CM | POA: Diagnosis not present

## 2023-01-06 DIAGNOSIS — M1990 Unspecified osteoarthritis, unspecified site: Secondary | ICD-10-CM | POA: Diagnosis not present

## 2023-01-06 DIAGNOSIS — E039 Hypothyroidism, unspecified: Secondary | ICD-10-CM | POA: Diagnosis not present

## 2023-01-06 DIAGNOSIS — E113591 Type 2 diabetes mellitus with proliferative diabetic retinopathy without macular edema, right eye: Secondary | ICD-10-CM | POA: Diagnosis not present

## 2023-01-06 DIAGNOSIS — Z794 Long term (current) use of insulin: Secondary | ICD-10-CM | POA: Diagnosis not present

## 2023-01-06 DIAGNOSIS — M8580 Other specified disorders of bone density and structure, unspecified site: Secondary | ICD-10-CM | POA: Diagnosis not present

## 2023-01-06 DIAGNOSIS — I4510 Unspecified right bundle-branch block: Secondary | ICD-10-CM | POA: Diagnosis not present

## 2023-01-06 DIAGNOSIS — I872 Venous insufficiency (chronic) (peripheral): Secondary | ICD-10-CM | POA: Diagnosis not present

## 2023-01-06 DIAGNOSIS — R9431 Abnormal electrocardiogram [ECG] [EKG]: Secondary | ICD-10-CM | POA: Diagnosis not present

## 2023-01-06 LAB — CULTURE, RESPIRATORY W GRAM STAIN: Culture: NORMAL

## 2023-01-08 LAB — CULTURE, BLOOD (ROUTINE X 2)
Culture: NO GROWTH
Culture: NO GROWTH

## 2023-01-18 DIAGNOSIS — I5030 Unspecified diastolic (congestive) heart failure: Secondary | ICD-10-CM | POA: Diagnosis not present

## 2023-01-18 DIAGNOSIS — Z1389 Encounter for screening for other disorder: Secondary | ICD-10-CM | POA: Diagnosis not present

## 2023-01-18 DIAGNOSIS — J189 Pneumonia, unspecified organism: Secondary | ICD-10-CM | POA: Diagnosis not present

## 2023-01-18 DIAGNOSIS — E876 Hypokalemia: Secondary | ICD-10-CM | POA: Diagnosis not present

## 2023-01-18 DIAGNOSIS — E1129 Type 2 diabetes mellitus with other diabetic kidney complication: Secondary | ICD-10-CM | POA: Diagnosis not present

## 2023-01-18 DIAGNOSIS — N185 Chronic kidney disease, stage 5: Secondary | ICD-10-CM | POA: Diagnosis not present

## 2023-01-18 DIAGNOSIS — B3731 Acute candidiasis of vulva and vagina: Secondary | ICD-10-CM | POA: Diagnosis not present

## 2023-01-18 DIAGNOSIS — I129 Hypertensive chronic kidney disease with stage 1 through stage 4 chronic kidney disease, or unspecified chronic kidney disease: Secondary | ICD-10-CM | POA: Diagnosis not present

## 2023-01-18 DIAGNOSIS — J9601 Acute respiratory failure with hypoxia: Secondary | ICD-10-CM | POA: Diagnosis not present

## 2023-01-18 DIAGNOSIS — E039 Hypothyroidism, unspecified: Secondary | ICD-10-CM | POA: Diagnosis not present

## 2023-01-18 DIAGNOSIS — R9431 Abnormal electrocardiogram [ECG] [EKG]: Secondary | ICD-10-CM | POA: Diagnosis not present

## 2023-01-18 DIAGNOSIS — E113553 Type 2 diabetes mellitus with stable proliferative diabetic retinopathy, bilateral: Secondary | ICD-10-CM | POA: Diagnosis not present

## 2023-01-18 DIAGNOSIS — J90 Pleural effusion, not elsewhere classified: Secondary | ICD-10-CM | POA: Diagnosis not present

## 2023-01-28 DIAGNOSIS — E669 Obesity, unspecified: Secondary | ICD-10-CM | POA: Diagnosis not present

## 2023-01-28 DIAGNOSIS — N2581 Secondary hyperparathyroidism of renal origin: Secondary | ICD-10-CM | POA: Diagnosis not present

## 2023-01-28 DIAGNOSIS — D631 Anemia in chronic kidney disease: Secondary | ICD-10-CM | POA: Diagnosis not present

## 2023-01-28 DIAGNOSIS — N184 Chronic kidney disease, stage 4 (severe): Secondary | ICD-10-CM | POA: Diagnosis not present

## 2023-01-28 DIAGNOSIS — N189 Chronic kidney disease, unspecified: Secondary | ICD-10-CM | POA: Diagnosis not present

## 2023-01-28 DIAGNOSIS — E1122 Type 2 diabetes mellitus with diabetic chronic kidney disease: Secondary | ICD-10-CM | POA: Diagnosis not present

## 2023-01-28 DIAGNOSIS — I129 Hypertensive chronic kidney disease with stage 1 through stage 4 chronic kidney disease, or unspecified chronic kidney disease: Secondary | ICD-10-CM | POA: Diagnosis not present

## 2023-01-28 DIAGNOSIS — R001 Bradycardia, unspecified: Secondary | ICD-10-CM | POA: Diagnosis not present

## 2023-01-28 LAB — LAB REPORT - SCANNED: EGFR: 13

## 2023-02-15 DIAGNOSIS — I10 Essential (primary) hypertension: Secondary | ICD-10-CM | POA: Diagnosis not present

## 2023-03-08 DIAGNOSIS — E1129 Type 2 diabetes mellitus with other diabetic kidney complication: Secondary | ICD-10-CM | POA: Diagnosis not present

## 2023-03-08 DIAGNOSIS — N185 Chronic kidney disease, stage 5: Secondary | ICD-10-CM | POA: Diagnosis not present

## 2023-03-08 DIAGNOSIS — I132 Hypertensive heart and chronic kidney disease with heart failure and with stage 5 chronic kidney disease, or end stage renal disease: Secondary | ICD-10-CM | POA: Diagnosis not present

## 2023-03-08 DIAGNOSIS — I5030 Unspecified diastolic (congestive) heart failure: Secondary | ICD-10-CM | POA: Diagnosis not present

## 2023-03-16 NOTE — Progress Notes (Unsigned)
 Cardiology Office Note:   Date:  03/17/2023  ID:  Rhonda Clayton, DOB 1945-09-19, MRN [id number] PCP:  Chilton Greathouse, MD  [facility] Providers Cardiologist:  Alverda Skeans, MD Referring MD: Terrial Rhodes, MD  Chief Complaint/Reason for Referral: Bradycardia ASSESSMENT:    1. Bradycardia   2. Type 2 diabetes mellitus with complication, with long-term current use of insulin (HCC)   3. Hyperlipidemia associated with type 2 diabetes mellitus (HCC)   4. Aortic atherosclerosis (HCC)   5. Hypertension associated with diabetes (HCC)   6. CKD stage 4 due to type 2 diabetes mellitus (HCC)   7. CHF with left ventricular diastolic dysfunction, NYHA class 1 (HCC)   8. BMI 37.0-37.9, adult   9. 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 Precordial pain   11. Chronic fatigue     PLAN:   In order of problems listed above: Bradycardia: Resolved; we will obtain a monitor and echocardiogram to evaluate further. Type 2 diabetes mellitus: Would benefit from aspirin however given patient's poor renal function will start Plavix 75 mg instead.  Consider Jardiance 10 mg daily for renal protection.  Will start atorvastatin 20 mg. Hyperlipidemia: Will check lipid panel and LP(a) today.  LDL in May was 196.  The patient defers statins.  Will refer to pharmacy for further management recommendations.   Aortic atherosclerosis: Start Plavix 75 mg and atorvastatin 20 mg. Hypertension: Blood pressure fairly well-controlled today. CKD stage IV: Consider Jardiance 10 mg for renal protection; patient is followed by nephrology. Chronic diastolic heart failure.  Continue Lasix and consider SGLT2 inhibitor. Elevated BMI: Refer to pharmacy for recommendations regarding GLP-1 receptor agonist therapy. 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 10.  Precordial pain: Will obtain PET stress test to evaluate further.  I suspect this likely has to do with resolution of her pneumonia. 11.   Fatigue: Will check CBC today TSH in December was within normal limits.  If low the patient will need to talk to her primary care provider about anemia.  I suspect this may be contributing to her fatigue.           Dispo:  Return in about 3 months (around 06/14/2023).      Medication Adjustments/Labs and Tests Ordered: Current medicines are reviewed at length with the patient today.  Concerns regarding medicines are outlined above.  The following changes have been made:  no change   Labs/tests ordered: Orders Placed This Encounter  Procedures   NM PET CT CARDIAC PERFUSION MULTI W/ABSOLUTE BLOODFLOW   Lipid panel   Lipoprotein A (LPA)   CBC   AMB Referral to [facility] Pharm-D   LONG TERM MONITOR (3-14 DAYS)   EKG 12-Lead   ECHOCARDIOGRAM COMPLETE    Medication Changes: No orders of the defined types were placed in this encounter.   Current medicines are reviewed at length with the patient today.  The patient does not have concerns regarding medicines.     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  At that visit her amlodipine was decreased to 2.5 mg given this heart rate issue.  She was referred for further recommendations.  The patient is EKG today demonstrates sinus rhythm with bifascicular block.  She tells me that she feels pretty fatigued and out of energy at many  times.  She has been getting over pneumonia that she contracted in November.  She still feels a chest burning at times.  Sometimes this is associated with exertion.  She occasionally feels chest fluttering.  Fortunately she has not developed any presyncope or syncope.  She has had no severe bleeding or bruising.  She is tolerating her medications well without any issues.  She does have evidence of aortic atherosclerosis on a prior CT scan and when asked about potentially starting a statin the patient is somewhat reluctant due to the potential brain fog.          Current Medications: Current Meds  Medication Sig   Acetaminophen (TYLENOL PO) Take 1 tablet by mouth daily as needed (pain).   albuterol (VENTOLIN HFA) 108 (90 Base) MCG/ACT inhaler Inhale 2 puffs into the lungs 3 (three) times daily as needed for wheezing or shortness of breath.   amLODipine (NORVASC) 2.5 MG tablet Take 5 mg by mouth at bedtime.   B-D UF III MINI PEN NEEDLES 31G X 5 MM MISC Inject into the skin 2 (two) times daily.   betamethasone dipropionate (DIPROLENE) 0.05 % cream Apply 1 Application topically as needed (skin condition).   betamethasone valerate (VALISONE) 0.1 % cream SMARTSIG:sparingly Topical Daily   Continuous Glucose Sensor (FREESTYLE LIBRE 3 SENSOR) MISC change every 14 days topically to monitor blood glucose continuously 28 days   fluconazole (DIFLUCAN) 150 MG tablet Take 150 mg by mouth every 3 (three) days.   furosemide (LASIX) 40 MG tablet Take 20-40 mg by mouth See admin instructions. Take 40 by mouth once daily on Monday, Wednesday, Friday. Take 20 mg once daily on Tuesday, Thursday, Saturday, and Sunday.   hydrALAZINE (APRESOLINE) 25 MG tablet Take 25 mg by mouth 3 (three) times daily.   Lancets (ONETOUCH DELICA PLUS LANCET33G) MISC Apply topically 3 (three) times daily.   levothyroxine (SYNTHROID) 100 MCG tablet Take 100 mcg by mouth daily before breakfast.   MECLIZINE HCL PO Take 0.5 tablets by  mouth as needed (dizziness).   NOVOLOG MIX 70/30 FLEXPEN (70-30) 100 UNIT/ML FlexPen Inject 20 Units into the skin in the morning and at bedtime.   nystatin (MYCOSTATIN) 100000 UNIT/ML suspension SMARTSIG:4 Milliliter(s) By Mouth 4 Times Daily   ONETOUCH VERIO test strip 3 (three) times daily.     Review of Systems:   Please see the history of present illness.    All other systems reviewed and are negative.     EKGs/Labs/Other Test Reviewed:   EKG: EKG from December 2024 demonstrates sinus rhythm, right bundle branch block, and IVCD pattern  EKG Interpretation Date/Time:  Wednesday March 17 2023 15:19:42 EST Ventricular Rate:  65 PR Interval:  200 QRS Duration:  156 QT Interval:  472 QTC Calculation: 490 R Axis:   -50  Text Interpretation: Normal sinus rhythm Right bundle branch block Left anterior fascicular block Bifascicular block Left ventricular hypertrophy with repolarization abnormality ( R in aVL , Romhilt-Estes ) Cannot rule out Septal infarct , age undetermined When compared with ECG of 03-Jan-2023 07:51, PREVIOUS ECG IS PRESENT  Confirmed by Alverda Skeans ([id number]) on 03/17/2023 3:21:53 PM         Risk Assessment/Calculations:          Physical Exam:   VS:  BP 136/68   Pulse 63   Ht 5' 1.5" (1.562 m)   Wt 188 lb 6.4 oz (85.5 kg)   SpO2 97%   BMI 35.02 kg/m        Wt Readings from Last 3 Encounters:  03/17/23 188 lb 6.4 oz (85.5 kg)  01/03/23 203 lb 0.7 oz (92.1 kg)  10/25/20 198 lb (89.8 kg)      GENERAL:  No apparent distress, AOx3 HEENT:  No carotid bruits, +2 carotid impulses, no scleral icterus CAR: RRR no murmurs, gallops, rubs, or thrills RES:  Clear to auscultation bilaterally ABD:  Soft, nontender, nondistended, positive bowel sounds x 4 VASC:  +2 radial pulses, +2 carotid pulses NEURO:  CN 2-12 grossly intact; motor and sensory grossly intact PSYCH:  No active depression or anxiety EXT:  No edema, ecchymosis, or cyanosis  Signed, Orbie Pyo, MD  03/17/2023 4:29 PM    [facility] [address] [place], [place], [place]  [address] Phone: [phone]; Fax: [phone]   Note:  This document was prepared using Dragon voice recognition software and may include unintentional dictation errors.

## 2023-03-17 ENCOUNTER — Encounter: Payer: Self-pay | Admitting: Internal Medicine

## 2023-03-17 ENCOUNTER — Ambulatory Visit: Payer: PPO | Attending: Internal Medicine

## 2023-03-17 ENCOUNTER — Ambulatory Visit: Payer: PPO | Attending: Internal Medicine | Admitting: Internal Medicine

## 2023-03-17 VITALS — BP 136/68 | HR 63 | Ht 61.5 in | Wt 188.4 lb

## 2023-03-17 DIAGNOSIS — R5382 Chronic fatigue, unspecified: Secondary | ICD-10-CM | POA: Diagnosis not present

## 2023-03-17 DIAGNOSIS — E785 Hyperlipidemia, unspecified: Secondary | ICD-10-CM

## 2023-03-17 DIAGNOSIS — I452 Bifascicular block: Secondary | ICD-10-CM

## 2023-03-17 DIAGNOSIS — Z794 Long term (current) use of insulin: Secondary | ICD-10-CM | POA: Diagnosis not present

## 2023-03-17 DIAGNOSIS — R001 Bradycardia, unspecified: Secondary | ICD-10-CM

## 2023-03-17 DIAGNOSIS — I700 Atherosclerosis of aorta: Secondary | ICD-10-CM | POA: Diagnosis not present

## 2023-03-17 DIAGNOSIS — E118 Type 2 diabetes mellitus with unspecified complications: Secondary | ICD-10-CM

## 2023-03-17 DIAGNOSIS — I152 Hypertension secondary to endocrine disorders: Secondary | ICD-10-CM

## 2023-03-17 DIAGNOSIS — N184 Chronic kidney disease, stage 4 (severe): Secondary | ICD-10-CM

## 2023-03-17 DIAGNOSIS — E1122 Type 2 diabetes mellitus with diabetic chronic kidney disease: Secondary | ICD-10-CM

## 2023-03-17 DIAGNOSIS — Z6837 Body mass index (BMI) 37.0-37.9, adult: Secondary | ICD-10-CM

## 2023-03-17 DIAGNOSIS — I5030 Unspecified diastolic (congestive) heart failure: Secondary | ICD-10-CM

## 2023-03-17 DIAGNOSIS — E1159 Type 2 diabetes mellitus with other circulatory complications: Secondary | ICD-10-CM

## 2023-03-17 DIAGNOSIS — R072 Precordial pain: Secondary | ICD-10-CM

## 2023-03-17 DIAGNOSIS — E1169 Type 2 diabetes mellitus with other specified complication: Secondary | ICD-10-CM

## 2023-03-17 LAB — LIPID PANEL

## 2023-03-17 NOTE — Progress Notes (Unsigned)
 Enrolled patient for a 7 day Zio XT monitor to be mailed to patients home.

## 2023-03-17 NOTE — Patient Instructions (Addendum)
 Medication Instructions:  Your physician recommends that you continue on your current medications as directed. Please refer to the Current Medication list given to you today.  *If you need a refill on your cardiac medications before your next appointment, please call your pharmacy*  Lab Work: TODAY(go to 1st floor, [address]): CBC, Lipid panel, LP(a) If you have labs (blood work) drawn today and your tests are completely normal, you will receive your results only by: MyChart Message (if you have MyChart) OR A paper copy in the mail If you have any lab test that is abnormal or we need to change your treatment, we will call you to review the results.  Testing/Procedures: Your provider has requested you have a cardiac PET stress CT scan performed. You will be called to schedule an appointment for this test.  Your physician has requested that you have an echocardiogram. Echocardiography is a painless test that uses sound waves to create images of your heart. It provides your doctor with information about the size and shape of your heart and how well your heart's chambers and valves are working. This procedure takes approximately one hour. There are no restrictions for this procedure. Please do NOT wear cologne, perfume, aftershave, or lotions (deodorant is allowed). Please arrive 15 minutes prior to your appointment time.  Please note: We ask at that you not bring children with you during ultrasound (echo/ vascular) testing. Due to room size and safety concerns, children are not allowed in the ultrasound rooms during exams. Our front office staff cannot provide observation of children in our lobby area while testing is being conducted. An adult accompanying a patient to their appointment will only be allowed in the ultrasound room at the discretion of the ultrasound technician under special circumstances. We apologize for any inconvenience.  Your physician has requested that you wear a Zio heart  monitor for 7 days. This will be mailed to your home with instructions on how to apply the monitor and how to return it when finished. Please allow 2 weeks after returning the heart monitor before our office calls you with the results.   Follow-Up: At [facility], you and your health needs are our priority.  As part of our continuing mission to provide you with exceptional heart care, we have created designated Provider Care Teams.  These Care Teams include your primary Cardiologist (physician) and Advanced Practice Providers (APPs -  Physician Assistants and Nurse Practitioners) who all work together to provide you with the care you need, when you need it.  Your next appointment:   3 month(s)  The format for your next appointment:   In Person  Provider:   Jari Favre, PA-C, Ronie Spies, PA-C, Robin Searing, NP, Tereso Newcomer, PA-C, or Perlie Gold, PA-C  Other Instructions    Please report to Radiology at the [facility] Main Entrance 30 minutes early for your test.  [address] [place], [place] [address]                         OR   Please report to Radiology at [facility] Main Entrance, medical mall, 30 mins prior to your test.  [address]  [place], [place]  How to Prepare for Your Cardiac PET/CT Stress Test:  Nothing to eat or drink, except water, 3 hours prior to arrival time.  NO caffeine/decaffeinated products, or chocolate 12 hours prior to arrival. (Please note decaffeinated beverages (teas/coffees)  still contain caffeine).  If you have caffeine within 12 hours prior, the test will need to be rescheduled.  Medication instructions: Do not take erectile dysfunction medications for 72 hours prior to test (sildenafil, tadalafil) Do not take nitrates (isosorbide mononitrate, Ranexa) the day before or day of test Do not take tamsulosin the day before or morning of test Hold theophylline containing medications for 12 hours. Hold  Dipyridamole 48 hours prior to the test.  Diabetic Preparation: If able to eat breakfast prior to 3 hour fasting, you may take all medications, including your insulin. Do not worry if you miss your breakfast dose of insulin - start at your next meal. If you do not eat prior to 3 hour fast-Hold all diabetes (oral and insulin) medications. Patients who wear a continuous glucose monitor MUST remove the device prior to scanning.  You may take your remaining medications with water.  NO perfume, cologne or lotion on chest or abdomen area. FEMALES - Please avoid wearing dresses to this appointment.  Total time is 1 to 2 hours; you may want to bring reading material for the waiting time.  IF YOU THINK YOU MAY BE PREGNANT, OR ARE NURSING PLEASE INFORM THE TECHNOLOGIST.  In preparation for your appointment, medication and supplies will be purchased.  Appointment availability is limited, so if you need to cancel or reschedule, please call the Radiology Department Scheduler at [phone] 24 hours in advance to avoid a cancellation fee of $100.00  What to Expect When you Arrive:  Once you arrive and check in for your appointment, you will be taken to a preparation room within the Radiology Department.  A technologist or Nurse will obtain your medical history, verify that you are correctly prepped for the exam, and explain the procedure.  Afterwards, an IV will be started in your arm and electrodes will be placed on your skin for EKG monitoring during the stress portion of the exam. Then you will be escorted to the PET/CT scanner.  There, staff will get you positioned on the scanner and obtain a blood pressure and EKG.  During the exam, you will continue to be connected to the EKG and blood pressure machines.  A small, safe amount of a radioactive tracer will be injected in your IV to obtain a series of pictures of your heart along with an injection of a stress agent.    After your Exam:  It is  recommended that you eat a meal and drink a caffeinated beverage to counter act any effects of the stress agent.  Drink plenty of fluids for the remainder of the day and urinate frequently for the first couple of hours after the exam.  Your doctor will inform you of your test results within 7-10 business days.  For more information and frequently asked questions, please visit our website: [email or web address]  For questions about your test or how to prepare for your test, please call: Cardiac Imaging Nurse Navigators Office: [phone]

## 2023-03-18 DIAGNOSIS — N2581 Secondary hyperparathyroidism of renal origin: Secondary | ICD-10-CM | POA: Diagnosis not present

## 2023-03-18 DIAGNOSIS — N189 Chronic kidney disease, unspecified: Secondary | ICD-10-CM | POA: Diagnosis not present

## 2023-03-18 DIAGNOSIS — I129 Hypertensive chronic kidney disease with stage 1 through stage 4 chronic kidney disease, or unspecified chronic kidney disease: Secondary | ICD-10-CM | POA: Diagnosis not present

## 2023-03-18 DIAGNOSIS — E669 Obesity, unspecified: Secondary | ICD-10-CM | POA: Diagnosis not present

## 2023-03-18 DIAGNOSIS — D631 Anemia in chronic kidney disease: Secondary | ICD-10-CM | POA: Diagnosis not present

## 2023-03-18 DIAGNOSIS — R001 Bradycardia, unspecified: Secondary | ICD-10-CM | POA: Diagnosis not present

## 2023-03-18 DIAGNOSIS — N184 Chronic kidney disease, stage 4 (severe): Secondary | ICD-10-CM | POA: Diagnosis not present

## 2023-03-18 DIAGNOSIS — I10 Essential (primary) hypertension: Secondary | ICD-10-CM | POA: Diagnosis not present

## 2023-03-18 DIAGNOSIS — E1122 Type 2 diabetes mellitus with diabetic chronic kidney disease: Secondary | ICD-10-CM | POA: Diagnosis not present

## 2023-03-18 DIAGNOSIS — E1129 Type 2 diabetes mellitus with other diabetic kidney complication: Secondary | ICD-10-CM | POA: Diagnosis not present

## 2023-03-19 ENCOUNTER — Other Ambulatory Visit: Payer: Self-pay

## 2023-03-19 DIAGNOSIS — E1169 Type 2 diabetes mellitus with other specified complication: Secondary | ICD-10-CM

## 2023-03-19 LAB — CBC
Hematocrit: 32 % — ABNORMAL LOW (ref 34.0–46.6)
Hemoglobin: 10.5 g/dL — ABNORMAL LOW (ref 11.1–15.9)
MCH: 28.1 pg (ref 26.6–33.0)
MCHC: 32.8 g/dL (ref 31.5–35.7)
MCV: 86 fL (ref 79–97)
Platelets: 394 10*3/uL (ref 150–450)
RBC: 3.74 x10E6/uL — ABNORMAL LOW (ref 3.77–5.28)
RDW: 13.9 % (ref 11.7–15.4)
WBC: 6.2 10*3/uL (ref 3.4–10.8)

## 2023-03-19 LAB — LIPID PANEL
Cholesterol, Total: 202 mg/dL — ABNORMAL HIGH (ref 100–199)
HDL: 47 mg/dL (ref >39)
LDL CALC COMMENT:: 4.3 ratio (ref 0.0–4.4)
LDL Chol Calc (NIH): 133 mg/dL — ABNORMAL HIGH (ref 0–99)
Triglycerides: 121 mg/dL (ref 0–149)
VLDL Cholesterol Cal: 22 mg/dL (ref 5–40)

## 2023-03-19 LAB — LIPOPROTEIN A (LPA): Lipoprotein (a): 39.2 nmol/L (ref <75.0)

## 2023-03-20 DIAGNOSIS — R001 Bradycardia, unspecified: Secondary | ICD-10-CM

## 2023-03-22 LAB — LAB REPORT - SCANNED
Creatinine, POC: 27.5 mg/dL
EGFR: 13

## 2023-03-26 DIAGNOSIS — D649 Anemia, unspecified: Secondary | ICD-10-CM | POA: Diagnosis not present

## 2023-03-26 DIAGNOSIS — H35069 Retinal vasculitis, unspecified eye: Secondary | ICD-10-CM | POA: Diagnosis not present

## 2023-03-26 DIAGNOSIS — E1129 Type 2 diabetes mellitus with other diabetic kidney complication: Secondary | ICD-10-CM | POA: Diagnosis not present

## 2023-03-26 DIAGNOSIS — N185 Chronic kidney disease, stage 5: Secondary | ICD-10-CM | POA: Diagnosis not present

## 2023-03-26 DIAGNOSIS — I5030 Unspecified diastolic (congestive) heart failure: Secondary | ICD-10-CM | POA: Diagnosis not present

## 2023-03-26 DIAGNOSIS — F39 Unspecified mood [affective] disorder: Secondary | ICD-10-CM | POA: Diagnosis not present

## 2023-03-26 DIAGNOSIS — E785 Hyperlipidemia, unspecified: Secondary | ICD-10-CM | POA: Diagnosis not present

## 2023-03-26 DIAGNOSIS — E113553 Type 2 diabetes mellitus with stable proliferative diabetic retinopathy, bilateral: Secondary | ICD-10-CM | POA: Diagnosis not present

## 2023-03-26 DIAGNOSIS — I130 Hypertensive heart and chronic kidney disease with heart failure and stage 1 through stage 4 chronic kidney disease, or unspecified chronic kidney disease: Secondary | ICD-10-CM | POA: Diagnosis not present

## 2023-03-26 DIAGNOSIS — E039 Hypothyroidism, unspecified: Secondary | ICD-10-CM | POA: Diagnosis not present

## 2023-04-05 ENCOUNTER — Ambulatory Visit: Payer: PPO | Admitting: Cardiology

## 2023-04-07 ENCOUNTER — Other Ambulatory Visit (HOSPITAL_COMMUNITY): Payer: PPO

## 2023-04-07 DIAGNOSIS — R001 Bradycardia, unspecified: Secondary | ICD-10-CM | POA: Diagnosis not present

## 2023-04-08 ENCOUNTER — Ambulatory Visit (HOSPITAL_COMMUNITY): Payer: PPO | Attending: Cardiovascular Disease

## 2023-04-08 DIAGNOSIS — R001 Bradycardia, unspecified: Secondary | ICD-10-CM | POA: Insufficient documentation

## 2023-04-08 LAB — ECHOCARDIOGRAM COMPLETE
AR max vel: 1.06 cm2
AV Area VTI: 1.1 cm2
AV Area mean vel: 1.1 cm2
AV Mean grad: 21 mmHg
AV Peak grad: 39.9 mmHg
Ao pk vel: 3.16 m/s
Area-P 1/2: 3.42 cm2
S' Lateral: 2.9 cm

## 2023-04-12 ENCOUNTER — Telehealth: Payer: Self-pay | Admitting: *Deleted

## 2023-04-12 DIAGNOSIS — R001 Bradycardia, unspecified: Secondary | ICD-10-CM

## 2023-04-12 NOTE — Telephone Encounter (Signed)
-----   Message from Orbie Pyo sent at 04/08/2023  1:20 PM EDT ----- Let her know her monitor showed some slow heart beats during waking hours, have her see next available EP for consult.

## 2023-04-12 NOTE — Telephone Encounter (Signed)
 Patient voices understanding of results and recommendations.  EP referral placed.  She is still getting short of breath and at times fatigued.  It is not every day and she is still pulling her trash cans to the curb and riding recumbent bike daily.  Denies lightheadedness, syncope or chest pain.  She will call for sooner visit if any of these occur or worsen.

## 2023-04-12 NOTE — Telephone Encounter (Signed)
-----   Message from Orbie Pyo sent at 04/09/2023  1:01 PM EDT ----- Let her know her heart is strong and her aortic valve is moderately stenosed.  If she is still short of breath and fatigued, I would like to see her sooner than May.

## 2023-04-13 ENCOUNTER — Telehealth: Payer: Self-pay

## 2023-04-13 NOTE — Telephone Encounter (Signed)
 I called and spoke with the pt and she agrees to see Dr Lynnette Caffey this Friday 04/16/23 at 3:40 pm.

## 2023-04-13 NOTE — Telephone Encounter (Signed)
-----   Message from Orbie Pyo sent at 04/13/2023 10:21 AM EDT ----- Regarding: RE: CAP screen Can we get her in to see me?  She has some bradycardia on her monitor (referred to EP) but I am thinking her AS is driving some of her symptoms.  Looks like there is a slot this Friday on my schedule at 340... ----- Message ----- From: Haywood Pao, RRT Sent: 04/13/2023   9:01 AM EDT To: Orbie Pyo, MD Subject: RE: CAP screen                                 She does meet. ----- Message ----- From: Orbie Pyo, MD Sent: 04/09/2023   1:02 PM EDT To: Haywood Pao, RRT Subject: CAP screen                                     Can you screen her TTE for CAP?

## 2023-04-15 NOTE — Progress Notes (Unsigned)
 Cardiology Office Note:   Date:  04/16/2023  ID:  Rhonda Clayton, DOB 11-27-1945, MRN [id number] PCP:  Chilton Greathouse, MD  [facility] Providers Cardiologist:  Alverda Skeans, MD Referring MD: Chilton Greathouse, MD  Chief Complaint/Reason for Referral: Bradycardia ASSESSMENT:    1. Nonrheumatic aortic valve stenosis   2. Bradycardia   3. Type 2 diabetes mellitus with complication, with long-term current use of insulin (HCC)   4. Hyperlipidemia associated with type 2 diabetes mellitus (HCC)   5. Aortic atherosclerosis (HCC)   6. Hypertension associated with diabetes (HCC)   7. CKD stage 4 due to type 2 diabetes mellitus (HCC)   8. Chronic diastolic heart failure (HCC)   9. BMI 37.0-37.9, adult   10. 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 Precordial pain      PLAN:   In order of problems listed above: Aortic stenosis: The patient has developed symptoms of fatigue and occasional dyspnea.  NYHA II and CCS I.  She does qualify for the progress trial.  The patient is understandably concerned about her chronic kidney disease.  She also may be at risk for need for pacemaker given her monitor results.  At this point in time the patient will meet with her nephrologist and EP in the coming weeks to make a decision about whether this is something that she would like to pursue.  Certainly we need to be careful about the CT scan and spacing out dye requiring procedures. Bradycardia: Monitor demonstrated frequent pauses and the patient has been referred to EP. Type 2 diabetes mellitus: Continue Plavix 75 mg, atorvastatin 20 mg.  Defer ARB and Jardiance for now.  Hyperlipidemia: LDL at goal recently  Aortic atherosclerosis: Continue Plavix 75 mg and atorvastatin 20 mg. Hypertension: Continue hydralazine 25 mg 3 times daily CKD stage IV: Consider Jardiance 10 mg for renal protection; patient is followed by nephrology. Chronic diastolic heart failure.  Continue  Lasix and consider SGLT2 inhibitor. Elevated BMI: Patient already referred to pharmacy r recommendations regarding GLP-1 receptor agonist therapy. 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  Has follow-up with EP given bradycardia and pauses Precordial pain: Has PET stress test scheduled.            Dispo:  Return in about 3 months (around 07/17/2023).      Medication Adjustments/Labs and Tests Ordered: Current medicines are reviewed at length with the patient today.  Concerns regarding medicines are outlined above.  The following changes have been made:  no change   Labs/tests ordered: No orders of the defined types were placed in this encounter.   Medication Changes: No orders of the defined types were placed in this encounter.   Current medicines are reviewed at length with the patient today.  The patient does not have concerns regarding medicines.     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  At that visit her amlodipine was decreased to 2.5 mg given this heart rate issue.  She was referred for further recommendations.  The patient is EKG today demonstrates sinus rhythm with bifascicular block.  She tells me that she feels pretty fatigued and out of energy at many times.  She has been getting over  pneumonia that she contracted in November.  She still feels a chest burning at times.  Sometimes this is associated with exertion.  She occasionally feels chest fluttering.  Fortunately she has not developed any presyncope or syncope.  She has had no severe bleeding or bruising.  She is tolerating her medications well without any issues.  She does have evidence of aortic atherosclerosis on a prior CT scan and when asked about potentially starting a statin the patient is somewhat reluctant due to the potential brain fog.  Plan: Start atorvastatin 20 mg, obtain monitor and echocardiogram, start Plavix 75 mg refer to pharmacy for GLP-1 receptor agonist therapy, obtain PET stress test to evaluate for ischemia.  March 2025:  Patient consents to use of AI scribe. Her monitor was also significant for pauses during waking hours and she was referred to EP.  Echocardiogram demonstrated moderate to severe aortic stenosis as well.  She is here to discuss those findings due to ongoing dyspnea.    She experiences fatigue and shortness of breath, which have progressively impacted her daily activities. She used to walk two miles but can no longer do so. Tasks such as vacuuming and pulling trash cans are difficult, sometimes causing a burning sensation in her chest, though she does not describe this as pain. No chest pain, lightheadedness, or swelling in her legs.  She reports poor sleep quality at night, often waking up frequently, which leads her to nap during the day. Her heart rate was noted to be low on March 2nd, when she felt particularly low energy.     Current Medications: Current Meds  Medication Sig   amLODipine (NORVASC) 2.5 MG tablet Take 5 mg by mouth at bedtime.   B-D UF III MINI PEN NEEDLES 31G X 5 MM MISC Inject into the skin 2 (two) times daily.   betamethasone dipropionate (DIPROLENE) 0.05 % cream Apply 1 Application topically as needed (skin condition).   betamethasone valerate (VALISONE) 0.1 %  cream SMARTSIG:sparingly Topical Daily   Continuous Glucose Sensor (FREESTYLE LIBRE 3 SENSOR) MISC change every 14 days topically to monitor blood glucose continuously 28 days   furosemide (LASIX) 40 MG tablet Take 20-40 mg by mouth See admin instructions. Take 40 by mouth once daily on Monday, Wednesday, Friday. Take 20 mg once daily on Tuesday, Thursday, Saturday, and Sunday.   hydrALAZINE (APRESOLINE) 25 MG tablet Take 25 mg by mouth 3 (three) times daily.   Lancets (ONETOUCH DELICA PLUS LANCET33G) MISC Apply topically 3 (three) times daily.   levothyroxine (SYNTHROID) 100 MCG tablet Take 100 mcg by mouth daily before breakfast.   MECLIZINE HCL PO Take 0.5 tablets by mouth as needed (dizziness).   NOVOLOG MIX 70/30 FLEXPEN (70-30) 100 UNIT/ML FlexPen Inject 20 Units into the skin in the morning and at bedtime.   ONETOUCH VERIO test strip 3 (three) times daily.   [DISCONTINUED] Acetaminophen (TYLENOL PO) Take 1 tablet by mouth daily as needed (pain).   [DISCONTINUED] albuterol (VENTOLIN HFA) 108 (90 Base) MCG/ACT inhaler Inhale 2 puffs into the lungs 3 (three) times daily  as needed for wheezing or shortness of breath.   [DISCONTINUED] fluconazole (DIFLUCAN) 150 MG tablet Take 150 mg by mouth every 3 (three) days.   [DISCONTINUED] nystatin (MYCOSTATIN) 100000 UNIT/ML suspension SMARTSIG:4 Milliliter(s) By Mouth 4 Times Daily     Review of Systems:   Please see the history of present illness.    All other systems reviewed and are negative.     EKGs/Labs/Other Test Reviewed:   EKG: EKG from December 2024 demonstrates sinus rhythm, right bundle branch block, and IVCD pattern  EKG Interpretation Date/Time:    Ventricular Rate:    PR Interval:    QRS Duration:    QT Interval:    QTC Calculation:   R Axis:      Text Interpretation:           Risk Assessment/Calculations:          Physical Exam:   VS:  BP 130/80   Pulse (!) 39   Ht 5' 1.5" (1.562 m)   Wt 188 lb (85.3 kg)    SpO2 98%   BMI 34.95 kg/m        Wt Readings from Last 3 Encounters:  04/16/23 188 lb (85.3 kg)  03/17/23 188 lb 6.4 oz (85.5 kg)  01/03/23 203 lb 0.7 oz (92.1 kg)      GENERAL:  No apparent distress, AOx3 HEENT:  No carotid bruits, +2 carotid impulses, no scleral icterus CAR: RRR 2/6 SEM, gallops, rubs, or thrills RES:  Clear to auscultation bilaterally ABD:  Soft, nontender, nondistended, positive bowel sounds x 4 VASC:  +2 radial pulses, +2 carotid pulses NEURO:  CN 2-12 grossly intact; motor and sensory grossly intact PSYCH:  No active depression or anxiety EXT:  No edema, ecchymosis, or cyanosis  Signed, Orbie Pyo, MD  04/16/2023 5:38 PM    [facility] [address] [place], [place], [place]  [address] Phone: [phone]; Fax: [phone]   Note:  This document was prepared using Dragon voice recognition software and may include unintentional dictation errors.

## 2023-04-16 ENCOUNTER — Encounter: Payer: Self-pay | Admitting: Internal Medicine

## 2023-04-16 ENCOUNTER — Ambulatory Visit: Attending: Internal Medicine | Admitting: Internal Medicine

## 2023-04-16 VITALS — BP 130/80 | HR 39 | Ht 61.5 in | Wt 188.0 lb

## 2023-04-16 DIAGNOSIS — Z794 Long term (current) use of insulin: Secondary | ICD-10-CM | POA: Diagnosis not present

## 2023-04-16 DIAGNOSIS — I700 Atherosclerosis of aorta: Secondary | ICD-10-CM | POA: Diagnosis not present

## 2023-04-16 DIAGNOSIS — I350 Nonrheumatic aortic (valve) stenosis: Secondary | ICD-10-CM

## 2023-04-16 DIAGNOSIS — R001 Bradycardia, unspecified: Secondary | ICD-10-CM

## 2023-04-16 DIAGNOSIS — Z6837 Body mass index (BMI) 37.0-37.9, adult: Secondary | ICD-10-CM | POA: Diagnosis not present

## 2023-04-16 DIAGNOSIS — I5032 Chronic diastolic (congestive) heart failure: Secondary | ICD-10-CM | POA: Diagnosis not present

## 2023-04-16 DIAGNOSIS — E1159 Type 2 diabetes mellitus with other circulatory complications: Secondary | ICD-10-CM

## 2023-04-16 DIAGNOSIS — I452 Bifascicular block: Secondary | ICD-10-CM | POA: Diagnosis not present

## 2023-04-16 DIAGNOSIS — E118 Type 2 diabetes mellitus with unspecified complications: Secondary | ICD-10-CM

## 2023-04-16 DIAGNOSIS — E1122 Type 2 diabetes mellitus with diabetic chronic kidney disease: Secondary | ICD-10-CM

## 2023-04-16 DIAGNOSIS — N184 Chronic kidney disease, stage 4 (severe): Secondary | ICD-10-CM

## 2023-04-16 DIAGNOSIS — I152 Hypertension secondary to endocrine disorders: Secondary | ICD-10-CM

## 2023-04-16 DIAGNOSIS — E1169 Type 2 diabetes mellitus with other specified complication: Secondary | ICD-10-CM

## 2023-04-16 DIAGNOSIS — E785 Hyperlipidemia, unspecified: Secondary | ICD-10-CM

## 2023-04-16 DIAGNOSIS — R072 Precordial pain: Secondary | ICD-10-CM

## 2023-04-16 NOTE — Patient Instructions (Signed)
 Medication Instructions:  No changes *If you need a refill on your cardiac medications before your next appointment, please call your pharmacy*  Lab Work: none If you have labs (blood work) drawn today and your tests are completely normal, you will receive your results only by: MyChart Message (if you have MyChart) OR A paper copy in the mail If you have any lab test that is abnormal or we need to change your treatment, we will call you to review the results.  Testing/Procedures: none  Follow-Up: At [facility], you and your health needs are our priority.  As part of our continuing mission to provide you with exceptional heart care, our providers are all part of one team.  This team includes your primary Cardiologist (physician) and Advanced Practice Providers or APPs (Physician Assistants and Nurse Practitioners) who all work together to provide you with the care you need, when you need it.  Your next appointment:   3 month(s)  Provider:   Alverda Skeans, MD    We recommend signing up for the patient portal called "MyChart".  Sign up information is provided on this After Visit Summary.  MyChart is used to connect with patients for Virtual Visits (Telemedicine).  Patients are able to view lab/test results, encounter notes, upcoming appointments, etc.  Non-urgent messages can be sent to your provider as well.   To learn more about what you can do with MyChart, go to [email or web address].   Other Instructions       1st Floor: - Lobby - Registration  - Pharmacy  - Lab - Cafe  2nd Floor: - PV Lab - Diagnostic Testing (echo, CT, nuclear med)  3rd Floor: - Vacant  4th Floor: - TCTS (cardiothoracic surgery) - AFib Clinic - Structural Heart Clinic - Vascular Surgery  - Vascular Ultrasound  5th Floor: - HeartCare Cardiology (general and EP) - Clinical Pharmacy for coumadin, hypertension, lipid, weight-loss medications, and med management  appointments    Valet parking services will be available as well.

## 2023-04-20 DIAGNOSIS — E113593 Type 2 diabetes mellitus with proliferative diabetic retinopathy without macular edema, bilateral: Secondary | ICD-10-CM | POA: Diagnosis not present

## 2023-04-20 DIAGNOSIS — H35371 Puckering of macula, right eye: Secondary | ICD-10-CM | POA: Diagnosis not present

## 2023-04-20 DIAGNOSIS — H02836 Dermatochalasis of left eye, unspecified eyelid: Secondary | ICD-10-CM | POA: Diagnosis not present

## 2023-04-20 DIAGNOSIS — H02833 Dermatochalasis of right eye, unspecified eyelid: Secondary | ICD-10-CM | POA: Diagnosis not present

## 2023-04-29 ENCOUNTER — Other Ambulatory Visit: Payer: Self-pay | Admitting: *Deleted

## 2023-04-29 DIAGNOSIS — N184 Chronic kidney disease, stage 4 (severe): Secondary | ICD-10-CM

## 2023-05-04 ENCOUNTER — Encounter: Payer: Self-pay | Admitting: Cardiology

## 2023-05-04 ENCOUNTER — Ambulatory Visit: Attending: Cardiology | Admitting: Cardiology

## 2023-05-04 VITALS — BP 148/60 | HR 62 | Ht 61.5 in | Wt 183.0 lb

## 2023-05-04 DIAGNOSIS — I495 Sick sinus syndrome: Secondary | ICD-10-CM

## 2023-05-04 DIAGNOSIS — R9431 Abnormal electrocardiogram [ECG] [EKG]: Secondary | ICD-10-CM

## 2023-05-04 DIAGNOSIS — R001 Bradycardia, unspecified: Secondary | ICD-10-CM | POA: Diagnosis not present

## 2023-05-04 DIAGNOSIS — Z01812 Encounter for preprocedural laboratory examination: Secondary | ICD-10-CM | POA: Diagnosis not present

## 2023-05-04 DIAGNOSIS — I452 Bifascicular block: Secondary | ICD-10-CM

## 2023-05-04 NOTE — Patient Instructions (Signed)
 Medication Instructions: Your physician recommends that you continue on your current medications as directed. Please refer to the Current Medication list given to you today.  *If you need a refill on your cardiac medications before your next appointment, please call your pharmacy*  Lab Work: None ordered.  If you have labs (blood work) drawn today and your tests are completely normal, you will receive your results only by: MyChart Message (if you have MyChart) OR A paper copy in the mail If you have any lab test that is abnormal or we need to change your treatment, we will call you to review the results.  Testing/Procedures: Your physician has recommended that you have a pacemaker inserted. A pacemaker is a small device that is placed under the skin of your chest or abdomen to help control abnormal heart rhythms. This device uses electrical pulses to prompt the heart to beat at a normal rate. Pacemakers are used to treat heart rhythms that are too slow. Wire (leads) are attached to the pacemaker that goes into the chambers of you heart. This is done in the hospital and usually requires and overnight stay. Please see the instruction sheet given to you today for more information.    Follow-Up: At [facility], you and your health needs are our priority.  As part of our continuing mission to provide you with exceptional heart care, our providers are all part of one team.  This team includes your primary Cardiologist (physician) and Advanced Practice Providers or APPs (Physician Assistants and Nurse Practitioners) who all work together to provide you with the care you need, when you need it.  Your next appointment:   To be scheduled      1st Floor: - Lobby - Registration  - Pharmacy  - Lab - Cafe  2nd Floor: - PV Lab - Diagnostic Testing (echo, CT, nuclear med)  3rd Floor: - Vacant  4th Floor: - TCTS (cardiothoracic surgery) - AFib Clinic - Structural Heart Clinic -  Vascular Surgery  - Vascular Ultrasound  5th Floor: - HeartCare Cardiology (general and EP) - Clinical Pharmacy for coumadin, hypertension, lipid, weight-loss medications, and med management appointments    Valet parking services will be available as well.

## 2023-05-04 NOTE — Progress Notes (Signed)
 Electrophysiology Office Note:   Date:  05/04/2023  ID:  YASMINA CHICO, DOB 1945/07/21, MRN [id number]  Primary Cardiologist: Kyra Phy, MD Primary Heart Failure: None Electrophysiologist: Yasenia Reedy Cortland Ding, MD      History of Present Illness:   Rhonda Clayton is a 78 y.o. female with h/o diabetes, aortic atherosclerosis, hyperlipidemia, carotid artery disease, stage IV CKD, hypertension, obesity, diastolic heart failure, aortic stenosis seen today for  for Electrophysiology evaluation of bradycardia at the request of Alyssa Backbone.    She was initially referred to cardiology after seeing her nephrologist.  Nephrologist noted heart rates in the 40s to 50s.  On workup from cardiology, she was found to have aortic stenosis.  She wore a cardiac monitor that showed significant bradycardia and pauses.  Most of the pauses occurred in the early morning hours, likely nocturnal, but she did have a few 4-second pauses between 7 and 9:00.  She is usually awake during this time.  Additionally, she has had episodes where her pulse oximeter has recorded heart rates in the 30s.  She feels weak, fatigued, short of breath when she has these episodes, though she is unable to tell if she feels a different when her heart rate is slow compared to her usual baseline.  She is quite frustrated with how she has been feeling.  She remembers before COVID that she used to walk quite a bit.  She is not able to do this.  She has trouble walking short distances without significant dyspnea.  Review of systems complete and found to be negative unless listed in HPI.   EP Information / Studies Reviewed:    EKG is ordered today. Personal review as below.  EKG Interpretation Date/Time:  Tuesday May 04 2023 15:29:03 EDT Ventricular Rate:  62 PR Interval:  224 QRS Duration:  156 QT Interval:  510 QTC Calculation: 517 R Axis:   -54  Text Interpretation: Sinus rhythm with 1st degree A-V block Right bundle branch  block Left anterior fascicular block Bifascicular block Left ventricular hypertrophy with repolarization abnormality ( R in aVL , Romhilt-Estes ) Cannot rule out Septal infarct (cited on or before 17-Mar-2023) When compared with ECG of 17-Mar-2023 15:19, No significant change was found Confirmed by [employer], Corazon Nickolas ([id number]) on 05/04/2023 3:36:29 PM     Risk Assessment/Calculations:             Physical Exam:   VS:  BP (!) 148/60 (BP Location: Left Arm, Patient Position: Sitting, Cuff Size: Large)   Pulse 62   Ht 5' 1.5" (1.562 m)   Wt 183 lb (83 kg)   SpO2 96%   BMI 34.02 kg/m    Wt Readings from Last 3 Encounters:  05/04/23 183 lb (83 kg)  04/16/23 188 lb (85.3 kg)  03/17/23 188 lb 6.4 oz (85.5 kg)     GEN: Well nourished, well developed in no acute distress NECK: No JVD; No carotid bruits CARDIAC: Regular rate and rhythm, no murmurs, rubs, gallops RESPIRATORY:  Clear to auscultation without rales, wheezing or rhonchi  ABDOMEN: Soft, non-tender, non-distended EXTREMITIES:  No edema; No deformity   ASSESSMENT AND PLAN:    1.  Sick sinus syndrome: Patient wore a cardiac monitor that shows pauses, mostly nocturnal related to sinus node dysfunction.  She did have some pauses during waking hours as well as heart rates in the 30s.  I spoke with her about possibility of pacemaker implantation.  I told her that this may improve some of  her shortness of breath, but likely her shortness of breath is multifactorial.  I gave her the option of whether or not to proceed with pacemaker implant.  For now, she would like to proceed.  We discussed risks and benefits.  She understands the risks and agreed to the procedure.  Explained risks, benefits, and alternatives to PPM implantation, including but not limited to bleeding, infection, pneumothorax, pericardial effusion, lead dislodgement, heart attack, stroke, or death.  Pt verbalized understanding and agrees to proceed.  2.  Aortic stenosis: Moderate  on most recent echo.  Plan per primary cardiology.  3.  CKD stage IV: Followed by nephrology.  4.  Hypertension: Mildly elevated.  Plan per nephrology and primary care  Follow up with Dr. Lawana Pray as usual post procedure  Signed, Naol Ontiveros Cortland Ding, MD

## 2023-05-06 NOTE — Progress Notes (Signed)
 [name], female   DOB: September 07, 1945, 78 y.o.   MRN: [id number]  Reason for Consult: New Patient (Initial Visit)   Referred by Charley Constable, MD  Subjective:     HPI  Rhonda Clayton is a 78 y.o. female who presents for evaluation HD access creation.  Past Medical History: No date: Anemia No date: CKD (chronic kidney disease) No date: Cystocele No date: DJD (degenerative joint disease) No date: DM (diabetes mellitus) (HCC) No date: Esophageal stricture No date: Essential hypertension No date: Hyperlipidemia No date: Hypothyroidism No date: Lichen sclerosus No date: Osteopenia No date: RBBB with left anterior fascicular block No date: Rectocele No date: Stress incontinence No date: Torn rotator cuff     Comment:  Bilateral No date: Type 2 diabetes mellitus (HCC) No date: Venous insufficiency 05/25/2019: Vitreous hemorrhage, left (HCC)     Comment:  The nature of the vitreous hemorrhage was discussed with              the patient as well as the common causes.   Patients with              diabetic eye disease may develop retinal               neovascularization.  Other eye conditions develop retinal              neovascularization secondary to retinal venous               occlusions.   Vitreous hemorrhage may result from               spontaneous vitreous detachment or retinal breaks.  Blunt              trauma is a common cause as wl  Family History  Problem Relation Age of Onset   Multiple myeloma Mother    Cancer Maternal Aunt        Cancer in the mouth - dipped snuff   Heart disease Brother    Diabetes Father    Colon polyps Brother    Heart disease Maternal Uncle    Heart disease Paternal Uncle    Heart disease Paternal Grandfather    Past Surgical History:  Procedure Laterality Date   ABDOMINAL EXPOSURE N/A 05/09/2014   Procedure: ABDOMINAL EXPOSURE;  Surgeon: Mayo Speck, MD;  Location: [facility];  Service: Vascular;  Laterality:  N/A;   ANTERIOR LAT LUMBAR FUSION Right 05/09/2014   Procedure: Lumbar three-four, Lumbar four-five Anterior lateral lumbar fusion;  Surgeon: Manya Sells, MD;  Location: [facility];  Service: Neurosurgery;  Laterality: Right;   ANTERIOR LUMBAR FUSION N/A 05/09/2014   Procedure: Stage 1:Lumbar four-five, Lumbar five-Sacral one Anterior lumbar interbody fusion with Dr. Sheika Douglas for approach ;  Surgeon: Manya Sells, MD;  Location: [facility];  Service: Neurosurgery;  Laterality: N/A;   CHOLECYSTECTOMY  1987   INCONTINENCE SURGERY     LUMBAR PERCUTANEOUS PEDICLE SCREW 3 LEVEL N/A 05/10/2014   Procedure: Stage 2: Percutaneous Pedicle Screws; Laminectomy L3-4  ;  Surgeon: Manya Sells, MD;  Location: [facility];  Service: Neurosurgery;  Laterality: N/A;  Stage 2: Percutaneous Pedicle Screws T10 to Ileum; Laminectomy L3-4     RECTAL SURGERY     VAGINA SURGERY  2018   T[place]i Fat Touch- Vaginal Dryness    Short Social History:  Social History   Tobacco Use   Smoking status: Never   Smokeless tobacco: Never  Substance Use Topics   Alcohol  use: No    Allergies  Allergen Reactions   Coffee Flavoring Agent (Non-Screening) Other (See Comments)    Sick on stomach, sneezing, backed up   Azithromycin  Other (See Comments)   Molds & Smuts     Allergy test showed    Pneumococcal Vac Polyvalent Other (See Comments)    Large site reaction    Cephalexin  Other (See Comments)    Yeast infection   Ciprofloxacin Other (See Comments)    tendinitis   Oatmeal Other (See Comments)    Sneezing and drainage   Sulfa Antibiotics Other (See Comments)    Yeast infection    Current Outpatient Medications  Medication Sig Dispense Refill   amLODipine  (NORVASC ) 2.5 MG tablet Take 5 mg by mouth at bedtime.     B-D UF III MINI PEN NEEDLES 31G X 5 MM MISC Inject into the skin 2 (two) times daily.     betamethasone valerate (VALISONE) 0.1 % cream SMARTSIG:sparingly Topical Daily     Continuous Glucose Sensor  (FREESTYLE LIBRE 3 SENSOR) MISC change every 14 days topically to monitor blood glucose continuously 28 days     furosemide  (LASIX ) 40 MG tablet Take 20-40 mg by mouth See admin instructions. Take 40 by mouth once daily on Monday, Wednesday, Friday. Take 20 mg once daily on Tuesday, Thursday, Saturday, and Sunday.     hydrALAZINE (APRESOLINE) 25 MG tablet Take 25 mg by mouth 3 (three) times daily.     Lancets (ONETOUCH DELICA PLUS LANCET33G) MISC Apply topically 3 (three) times daily.     levothyroxine  (SYNTHROID ) 100 MCG tablet Take 100 mcg by mouth daily before breakfast.     MECLIZINE HCL PO Take 0.5 tablets by mouth as needed (dizziness).     NOVOLOG  MIX 70/30 FLEXPEN (70-30) 100 UNIT/ML FlexPen Inject 20 Units into the skin in the morning and at bedtime.     ONETOUCH VERIO test strip 3 (three) times daily.     betamethasone dipropionate (DIPROLENE) 0.05 % cream Apply 1 Application topically as needed (skin condition). (Patient not taking: Reported on 05/04/2023)     No current facility-administered medications for this visit.    REVIEW OF SYSTEMS   All other systems were reviewed and are negative     Objective:  Objective   Vitals:   05/07/23 1531  BP: (!) 143/52  Pulse: (!) 38  Resp: 18  Temp: 97.8 F (36.6 C)  TempSrc: Temporal  SpO2: 98%  Weight: 191 lb 6.4 oz (86.8 kg)  Height: 5' 5.1" (1.654 m)   Body mass index is 31.75 kg/m.  Physical Exam General: no acute distress Cardiac: hemodynamically stable Pulm: normal work of breathing Neuro: alert, no focal deficit Extremities: No incisions, no edema or wounds Vascular:   Right: palpable brachial, radial  Left: palpable brachial, radial   Data: UE arterial duplex Right Pre-Dialysis Findings:  +-----------------------+----------+--------------------+--------+--------+   Location              PSV (cm/s)Intralum. Diam.  (cm)WaveformComments   +-----------------------+----------+--------------------+--------+--------+   Brachial Antecub. fossa53        0.48                biphasic           +-----------------------+----------+--------------------+--------+--------+   Radial Art at Wrist    72        0.30                biphasic           +-----------------------+----------+--------------------+--------+--------+  Ulnar Art at Wrist     64        0.11                biphasic           +-----------------------+----------+--------------------+--------+--------+    Left Pre-Dialysis Findings:  +-----------------------+----------+--------------------+--------+--------+   Location              PSV (cm/s)Intralum. Diam.  (cm)WaveformComments  +-----------------------+----------+--------------------+--------+--------+   Brachial Antecub. fossa63        0.52                biphasic           +-----------------------+----------+--------------------+--------+--------+   Radial Art at Wrist    65        0.21                biphasic           +-----------------------+----------+--------------------+--------+--------+   Ulnar Art at Wrist     57        0.25                biphasic           +-----------------------+----------+--------------------+--------+--------+    Vein mapping +-----------------+-------------+----------+---------+  Right Cephalic   Diameter (cm)Depth (cm)Findings   +-----------------+-------------+----------+---------+  Prox upper arm       0.35        1.17              +-----------------+-------------+----------+---------+  Mid upper arm        0.37        0.83              +-----------------+-------------+----------+---------+  Dist upper arm       0.42        0.62              +-----------------+-------------+----------+---------+  Antecubital fossa    0.65        0.13   branching   +-----------------+-------------+----------+---------+  Prox forearm         0.28        0.28   branching  +-----------------+-------------+----------+---------+  Mid forearm          0.34        0.31              +-----------------+-------------+----------+---------+  Dist forearm         0.32        0.11              +-----------------+-------------+----------+---------+   +-----------------+-------------+----------+---------+  Right Basilic    Diameter (cm)Depth (cm)Findings   +-----------------+-------------+----------+---------+  Prox upper arm       1.04        1.25              +-----------------+-------------+----------+---------+  Mid upper arm        0.44        1.84              +-----------------+-------------+----------+---------+  Dist upper arm       0.38        1.80              +-----------------+-------------+----------+---------+  Antecubital fossa    0.26        0.38              +-----------------+-------------+----------+---------+  Prox forearm         0.26  0.23   branching  +-----------------+-------------+----------+---------+  Mid forearm          0.11        0.16              +-----------------+-------------+----------+---------+  Distal forearm       0.09        0.11              +-----------------+-------------+----------+---------+   +-----------------+-------------+----------+--------+  Left Cephalic    Diameter (cm)Depth (cm)Findings  +-----------------+-------------+----------+--------+  Prox upper arm       0.36        1.54             +-----------------+-------------+----------+--------+  Mid upper arm        0.21        0.72             +-----------------+-------------+----------+--------+  Dist upper arm       0.47        0.23             +-----------------+-------------+----------+--------+  Antecubital fossa    0.26        0.15              +-----------------+-------------+----------+--------+  Prox forearm         0.13        0.28             +-----------------+-------------+----------+--------+  Mid forearm          0.10        0.03             +-----------------+-------------+----------+--------+  Dist forearm         0.22        0.14             +-----------------+-------------+----------+--------+   +-----------------+-------------+----------+---------+  Left Basilic     Diameter (cm)Depth (cm)Findings   +-----------------+-------------+----------+---------+  Prox upper arm       0.42        1.32              +-----------------+-------------+----------+---------+  Mid upper arm        0.55        1.32              +-----------------+-------------+----------+---------+  Dist upper arm       0.30        0.64              +-----------------+-------------+----------+---------+  Antecubital fossa    0.27        0.42              +-----------------+-------------+----------+---------+  Prox forearm         0.28        0.50   branching  +-----------------+-------------+----------+---------+  Mid forearm          0.28        0.31              +-----------------+-------------+----------+---------+  Distal forearm       0.16        0.11              +-----------------+-------------+----------+---------+   Note from nephrologist reviewed AVF now, weight on AVG      Assessment/Plan:     Rhonda Clayton is a 78 y.o. female with CKD stage V who presents to discuss permanent access creation.  Dominant hand: right Previous access surgeries: none Previous catheters:  now Other arm surgeries/injuries: none  The vein mapping suggests there is suitable superficial veins and we discussed that they are a candidate for AVF creation   The risks an benefits including of access creation were reviewed including: need for additional procedures, need for additional creations,  steal, ischemia monomelic neuropathy, failure of access, and bleeding. The patient expressed understand and is willing to proceed.   She is interested in PD and would like to pursue that option first.  I explained that I will message my partners who placed PD catheters to see if she is a candidate.  She has had an open gallbladder surgery in the 90s with an incision in the right upper quadrant as well as an ALIF but when reviewing the op notes it does not appear that the peritoneal cavity was entered.   I will message her nephrologist as well to determine if peritoneal dialysis would be an option.  If not we will plan to schedule her for left arm brachio cephalic fistula in the coming weeks. She is also having a pacemaker placed, I will message her cardiologist and electrophysiologist regarding possible placement of her pacemaker on the right to preserve her left central system for fistula outflow     Philipp Brawn MD [facility]

## 2023-05-07 ENCOUNTER — Encounter: Payer: Self-pay | Admitting: Vascular Surgery

## 2023-05-07 ENCOUNTER — Ambulatory Visit (HOSPITAL_BASED_OUTPATIENT_CLINIC_OR_DEPARTMENT_OTHER)
Admission: RE | Admit: 2023-05-07 | Discharge: 2023-05-07 | Disposition: A | Discharge status: Home / Self Care | Source: Ambulatory Visit | Attending: Vascular Surgery

## 2023-05-07 ENCOUNTER — Ambulatory Visit (INDEPENDENT_AMBULATORY_CARE_PROVIDER_SITE_OTHER): Admitting: Vascular Surgery

## 2023-05-07 ENCOUNTER — Ambulatory Visit (HOSPITAL_COMMUNITY)
Admission: RE | Admit: 2023-05-07 | Discharge: 2023-05-07 | Disposition: A | Discharge status: Home / Self Care | Source: Ambulatory Visit | Attending: Vascular Surgery

## 2023-05-07 VITALS — BP 143/52 | HR 38 | Temp 97.8°F | Resp 18 | Ht 65.1 in | Wt 191.4 lb

## 2023-05-07 DIAGNOSIS — N184 Chronic kidney disease, stage 4 (severe): Secondary | ICD-10-CM

## 2023-05-07 DIAGNOSIS — N185 Chronic kidney disease, stage 5: Secondary | ICD-10-CM | POA: Diagnosis not present

## 2023-05-10 ENCOUNTER — Telehealth: Payer: Self-pay | Admitting: Pharmacy Technician

## 2023-05-10 ENCOUNTER — Telehealth: Payer: Self-pay | Admitting: Pharmacist

## 2023-05-10 ENCOUNTER — Ambulatory Visit: Attending: Pharmacist | Admitting: Pharmacist

## 2023-05-10 ENCOUNTER — Other Ambulatory Visit (HOSPITAL_COMMUNITY): Payer: Self-pay

## 2023-05-10 ENCOUNTER — Encounter: Payer: Self-pay | Admitting: Pharmacist

## 2023-05-10 DIAGNOSIS — E785 Hyperlipidemia, unspecified: Secondary | ICD-10-CM | POA: Insufficient documentation

## 2023-05-10 DIAGNOSIS — E782 Mixed hyperlipidemia: Secondary | ICD-10-CM | POA: Diagnosis not present

## 2023-05-10 NOTE — Telephone Encounter (Addendum)
 Pharmacy Patient Advocate Encounter- 2 insurances   Received notification from Pt Calls Messages that prior authorization for repatha is required/requested.   Insurance verification completed.   The patient is insured through [facility] ADVANTAGE/RX ADVANCE .   Per test claim: PA required; PA submitted to above mentioned insurance via CoverMyMeds Key/confirmation #/EOC [facility] Status is pending    Pharmacy Patient Advocate Encounter   Received notification from [employer] Messages that prior authorization for repatha is required/requested.   Insurance verification completed.   The patient is insured through  [employer]  .   Per test claim: PA required; PA submitted to above mentioned insurance via CoverMyMeds Key/confirmation #/EOC [facility] Status is pending

## 2023-05-10 NOTE — Telephone Encounter (Addendum)
 Pharmacy Patient Advocate Encounter  Received notification from [employer] that Prior Authorization for repatha has been APPROVED from 05/10/23 to 11/06/23. Ran test claim, Copay is $47.00- one month. This test claim was processed through [facility]- copay amounts may vary at other pharmacies due to pharmacy/plan contracts, or as the patient moves through the different stages of their insurance plan.   PA #/Case ID/Reference #: [id number]   Pharmacy Patient Advocate Encounter  Received notification from  [employer]  that Prior Authorization for repatha has been APPROVED from 05/17/23 to 05/16/24   PA #/Case ID/Reference #: [id number]

## 2023-05-10 NOTE — Telephone Encounter (Deleted)
 Pharmacy Patient Advocate Encounter- 2 insurances   Received notification from Pt Calls Messages that prior authorization for repatha is required/requested.   Insurance verification completed.   The patient is insured through  [employer]  .   Per test claim: PA required; PA submitted to above mentioned insurance via CoverMyMeds Key/confirmation #/EOC [facility] Status is pending

## 2023-05-10 NOTE — Progress Notes (Signed)
 Patient ID: [name]                 DOB: 1945-05-04                    MRN: [id number]      HPI: Rhonda Clayton is a 78 y.o. female patient referred to lipid clinic by Dr.Thukkani. PMH is significant for hypertension, CHF, T2DM, CKD stage IV, Anemia  Last Lipid lab LDL 133 pt not on amy lipid lowering medications. Pt was referred to the lipid clinic.   Patient can not come to the office so this was telephone visit. I spend 45 min over the phone discussing various LDL lowering agents. Pt states she had never tried statin and she will not try statin. Heard lot of bad experiences from family and friends. Today she is interested in learning statins free lipid lowering agents   Reviewed options for lowering LDL cholesterol, including ezetimibe, PCSK-9 inhibitors, bempedoic acid and inclisiran.  Discussed mechanisms of action, dosing, side effects and potential decreases in LDL cholesterol.  Also reviewed cost information and potential options for patient assistance.   Current Medications: none  Intolerances: none Risk Factors:  hypertension, CHF, T2DM, CKD stage IV LDL goal: <55   Diet: working on diet ,likes cheese  Beef once a week, fish- baked,salads with home made dressing   Exercise: recumbent bike 30 min 5 days a week for 30 min  Family History:  Relation Problem Comments  Mother Multiple myeloma     Father Diabetes     Brother Heart disease     Brother Colon polyps     Maternal Aunt Cancer Cancer in the mouth - dipped snuff    Maternal Uncle Heart disease     Paternal Uncle Heart disease     Paternal Grandfather Heart disease     Social History:  Alcohol :none Smoking:none Labs:  Lipid Panel     Component Value Date/Time   CHOL 202 (H) 03/17/2023 1653   TRIG 121 03/17/2023 1653   HDL 47 03/17/2023 1653   CHOLHDL 4.3 03/17/2023 1653   LDLCALC 133 (H) 03/17/2023 1653   LABVLDL 22 03/17/2023 1653    Past Medical History:  Diagnosis Date   Anemia     CKD (chronic kidney disease)    Cystocele    DJD (degenerative joint disease)    DM (diabetes mellitus) (HCC)    Esophageal stricture    Essential hypertension    Hyperlipidemia    Hypothyroidism    Lichen sclerosus    Osteopenia    RBBB with left anterior fascicular block    Rectocele    Stress incontinence    Torn rotator cuff    Bilateral   Type 2 diabetes mellitus (HCC)    Venous insufficiency    Vitreous hemorrhage, left (HCC) 05/25/2019   The nature of the vitreous hemorrhage was discussed with the patient as well as the common causes.   Patients with diabetic eye disease may develop retinal neovascularization.  Other eye conditions develop retinal  neovascularization secondary to retinal venous occlusions.   Vitreous hemorrhage may result from spontaneous vitreous detachment or retinal breaks.  Blunt trauma is a common cause as wl    Current Outpatient Medications on File Prior to Visit  Medication Sig Dispense Refill   amLODipine  (NORVASC ) 2.5 MG tablet Take 5 mg by mouth at bedtime.     B-D UF III MINI PEN NEEDLES 31G X 5 MM MISC Inject into  the skin 2 (two) times daily.     betamethasone dipropionate (DIPROLENE) 0.05 % cream Apply 1 Application topically as needed (skin condition). (Patient not taking: Reported on 05/04/2023)     betamethasone valerate (VALISONE) 0.1 % cream SMARTSIG:sparingly Topical Daily     Continuous Glucose Sensor (FREESTYLE LIBRE 3 SENSOR) MISC change every 14 days topically to monitor blood glucose continuously 28 days     furosemide  (LASIX ) 40 MG tablet Take 20-40 mg by mouth See admin instructions. Take 40 by mouth once daily on Monday, Wednesday, Friday. Take 20 mg once daily on Tuesday, Thursday, Saturday, and Sunday.     hydrALAZINE (APRESOLINE) 25 MG tablet Take 25 mg by mouth 3 (three) times daily.     Lancets (ONETOUCH DELICA PLUS LANCET33G) MISC Apply topically 3 (three) times daily.     levothyroxine  (SYNTHROID ) 100 MCG tablet Take 100 mcg by  mouth daily before breakfast.     MECLIZINE HCL PO Take 0.5 tablets by mouth as needed (dizziness).     NOVOLOG  MIX 70/30 FLEXPEN (70-30) 100 UNIT/ML FlexPen Inject 20 Units into the skin in the morning and at bedtime.     ONETOUCH VERIO test strip 3 (three) times daily.     No current facility-administered medications on file prior to visit.    Allergies  Allergen Reactions   Coffee Flavoring Agent (Non-Screening) Other (See Comments)    Sick on stomach, sneezing, backed up   Azithromycin  Other (See Comments)   Molds & Smuts     Allergy test showed    Pneumococcal Vac Polyvalent Other (See Comments)    Large site reaction    Cephalexin  Other (See Comments)    Yeast infection   Ciprofloxacin Other (See Comments)    tendinitis   Oatmeal Other (See Comments)    Sneezing and drainage   Sulfa Antibiotics Other (See Comments)    Yeast infection    Assessment/Plan:  1. Hyperlipidemia -  Problem  Hyperlipidemia   Current Medications: none  Intolerances: none Risk Factors:  hypertension, CHF, T2DM, CKD stage IV LDL goal: <55    Hyperlipidemia Patient can not come to the office so this was telephone visit. I spend 45 min over the phone discussing various LDL lowering agents. Pt states she had never tried statin and she will not try statin. Heard lot of bad experiences from family and friends. Today she is interested in learning statins free lipid lowering agents   Reviewed options for lowering LDL cholesterol, including ezetimibe, PCSK-9 inhibitors, bempedoic acid and inclisiran.  Discussed mechanisms of action, dosing, side effects and potential decreases in LDL cholesterol.  Also reviewed cost information and potential options for patient assistance.   Patient wants to think about the non-statin options that we discussed today and if she interested on going on any she will reach out to us      Thank you,  Nickola Baron, Pharm.D [facility]  [facility] [address], [place], [place] [address]  Phone: [phone]; Fax: [phone]

## 2023-05-10 NOTE — Assessment & Plan Note (Signed)
 Patient can not come to the office so this was telephone visit. I spend 45 min over the phone discussing various LDL lowering agents. Pt states she had never tried statin and she will not try statin. Heard lot of bad experiences from family and friends. Today she is interested in learning statins free lipid lowering agents   Reviewed options for lowering LDL cholesterol, including ezetimibe, PCSK-9 inhibitors, bempedoic acid and inclisiran.  Discussed mechanisms of action, dosing, side effects and potential decreases in LDL cholesterol.  Also reviewed cost information and potential options for patient assistance.   Patient wants to think about the non-statin options that we discussed today and if she interested on going on any she will reach out to us 

## 2023-05-12 ENCOUNTER — Telehealth: Payer: Self-pay

## 2023-05-12 NOTE — Telephone Encounter (Signed)
 Informed patient that she is not a candidate for PD catheter placement, per Dr. [name].  She would like to know if it's possible to wait for AVF placement until after her pacemaker placement in late May.  Message sent to [facility]

## 2023-05-13 ENCOUNTER — Other Ambulatory Visit (HOSPITAL_COMMUNITY): Payer: Self-pay

## 2023-05-14 NOTE — Telephone Encounter (Signed)
 PA approved see telephone visit encounter for more info. Pt doe not want to go on any therapy when ready will call us .

## 2023-05-17 ENCOUNTER — Other Ambulatory Visit (HOSPITAL_COMMUNITY): Payer: Self-pay

## 2023-05-17 ENCOUNTER — Telehealth: Payer: Self-pay | Admitting: Internal Medicine

## 2023-05-17 NOTE — Telephone Encounter (Signed)
 Patient is requesting to speak with Pharmacist Nickola Baron. Please advise.

## 2023-05-19 NOTE — Telephone Encounter (Signed)
 Called back, N/A LVM.

## 2023-05-19 NOTE — Telephone Encounter (Signed)
 Patient called back, she does not want to be on any lipid lowering medication including statins, Zetia or Repatha.

## 2023-05-20 ENCOUNTER — Telehealth (HOSPITAL_COMMUNITY): Payer: Self-pay

## 2023-05-20 NOTE — Telephone Encounter (Signed)
 Spoke with patient to complete pre-procedure call.     New medical conditions? Patient was seen by Dr. [name] to discuss permanent dialysis access and states provider inquired if PPM could possible be placed towards the right side of chest wall because he would like to use her left arm for access placement. Noted from OV note, Dr. [name] planned to message Dr. Lawana Pray.   Recent hospitalizations or surgeries? No Started any new medications? No Patient made aware to contact office to inform of any new medications started. Any changes in activities of daily living? No  Pre-procedure testing scheduled: lab work on 05/28/23 at yearly appt with PCP office   Confirmed patient is scheduled for Permanent transvenous pacemaker (PPM) on Wednesday, May 28 with Dr. Agatha Horsfall. Instructed patient to arrive at the Main Entrance A at [facility]: [address] [place], [place] [address] and check in at Admitting at 2:00 PM.  Advised of plan to go home the same day and will only stay overnight if medically necessary. You MUST have a responsible adult to drive you home and MUST be with you the first 24 hours after you arrive home or your procedure could be cancelled.  Patient verbalized understanding to information provided and is agreeable to proceed with procedure.

## 2023-05-21 NOTE — Telephone Encounter (Signed)
 Spoke with patient to relay provider recommendations. Reports she will have lab work with PCP on 5/9 to evaluate her kidney function and the AVF is being placed in her left arm because it is her non-dominant side for preparation if dialysis is needed.    Advised patient she would still need an appointment to discuss further. She insisted to keep PPM implant as scheduled on May 28, because she already has family flying in from [place] .

## 2023-05-21 NOTE — Telephone Encounter (Signed)
 Pt really wants to discuss this further with Dr. Lawana Pray.   Scheduler scheduled pt to see him on 5/13, date held for [facility] implant for 5/28 -- pt insistent. She appreciates my follow  up

## 2023-05-28 DIAGNOSIS — E785 Hyperlipidemia, unspecified: Secondary | ICD-10-CM | POA: Diagnosis not present

## 2023-05-28 DIAGNOSIS — I5030 Unspecified diastolic (congestive) heart failure: Secondary | ICD-10-CM | POA: Diagnosis not present

## 2023-05-28 DIAGNOSIS — N185 Chronic kidney disease, stage 5: Secondary | ICD-10-CM | POA: Diagnosis not present

## 2023-05-28 DIAGNOSIS — E039 Hypothyroidism, unspecified: Secondary | ICD-10-CM | POA: Diagnosis not present

## 2023-05-28 DIAGNOSIS — M8580 Other specified disorders of bone density and structure, unspecified site: Secondary | ICD-10-CM | POA: Diagnosis not present

## 2023-05-28 DIAGNOSIS — I130 Hypertensive heart and chronic kidney disease with heart failure and stage 1 through stage 4 chronic kidney disease, or unspecified chronic kidney disease: Secondary | ICD-10-CM | POA: Diagnosis not present

## 2023-05-28 DIAGNOSIS — M109 Gout, unspecified: Secondary | ICD-10-CM | POA: Diagnosis not present

## 2023-05-28 DIAGNOSIS — D649 Anemia, unspecified: Secondary | ICD-10-CM | POA: Diagnosis not present

## 2023-05-28 DIAGNOSIS — E113553 Type 2 diabetes mellitus with stable proliferative diabetic retinopathy, bilateral: Secondary | ICD-10-CM | POA: Diagnosis not present

## 2023-05-28 LAB — LAB REPORT - SCANNED: EGFR (Non-African Amer.): 15

## 2023-05-31 DIAGNOSIS — D2262 Melanocytic nevi of left upper limb, including shoulder: Secondary | ICD-10-CM | POA: Diagnosis not present

## 2023-05-31 DIAGNOSIS — D224 Melanocytic nevi of scalp and neck: Secondary | ICD-10-CM | POA: Diagnosis not present

## 2023-05-31 DIAGNOSIS — L821 Other seborrheic keratosis: Secondary | ICD-10-CM | POA: Diagnosis not present

## 2023-05-31 DIAGNOSIS — D225 Melanocytic nevi of trunk: Secondary | ICD-10-CM | POA: Diagnosis not present

## 2023-06-01 ENCOUNTER — Ambulatory Visit: Attending: Cardiology | Admitting: Cardiology

## 2023-06-01 ENCOUNTER — Encounter: Payer: Self-pay | Admitting: Cardiology

## 2023-06-01 VITALS — BP 174/54 | HR 68 | Ht 65.1 in | Wt 186.0 lb

## 2023-06-01 DIAGNOSIS — R001 Bradycardia, unspecified: Secondary | ICD-10-CM

## 2023-06-01 DIAGNOSIS — R072 Precordial pain: Secondary | ICD-10-CM | POA: Diagnosis not present

## 2023-06-01 DIAGNOSIS — I452 Bifascicular block: Secondary | ICD-10-CM | POA: Diagnosis not present

## 2023-06-01 DIAGNOSIS — I495 Sick sinus syndrome: Secondary | ICD-10-CM | POA: Diagnosis not present

## 2023-06-01 MED ORDER — NITROGLYCERIN 0.4 MG SL SUBL: 0.4000 mg | SUBLINGUAL_TABLET | SUBLINGUAL | 6 refills | Status: AC | PRN

## 2023-06-01 NOTE — Patient Instructions (Signed)
 Medication Instructions:   TAKE NTG 1 TABLET UNDER THE TONGUE EVERY 5 MINUTES AS NEEDED FOR CHEST PAIN-MAY TAKE UP TO 3 TABLETS WAITING 5 MINUTES BETWEEN DOSES-IF NO RELIEF OF PAIN AFTER THE 3 RD NTG-CALL [phone]  *If you need a refill on your cardiac medications before your next appointment, please call your pharmacy*   Follow-Up: At [facility], you and your health needs are our priority.  As part of our continuing mission to provide you with exceptional heart care, our providers are all part of one team.  This team includes your primary Cardiologist (physician) and Advanced Practice Providers or APPs (Physician Assistants and Nurse Practitioners) who all work together to provide you with the care you need, when you need it.  Your next appointment:   TO BE DETERMINED  We recommend signing up for the patient portal called "MyChart".  Sign up information is provided on this After Visit Summary.  MyChart is used to connect with patients for Virtual Visits (Telemedicine).  Patients are able to view lab/test results, encounter notes, upcoming appointments, etc.  Non-urgent messages can be sent to your provider as well.   To learn more about what you can do with MyChart, go to [email or web address].

## 2023-06-01 NOTE — Progress Notes (Signed)
 Electrophysiology Office Note:   Date:  06/01/2023  ID:  Rhonda Clayton, DOB 1945-03-04, MRN [id number]  Primary Cardiologist: Kyra Phy, MD Primary Heart Failure: None Electrophysiologist: Catheleen Langhorne Cortland Ding, MD      History of Present Illness:   Rhonda Clayton is a 78 y.o. female with h/o Diabetes, aortic atherosclerosis, hyperlipidemia, carotid artery disease, CKD stage IV, hypertension, obesity, diastolic heart failure, aortic stenosis seen today for routine electrophysiology followup.   She wore a cardiac monitor that showed significant bradycardia with pauses.  Most of the pauses occurred in the early morning hours.  She had a few 4-second pauses between 7 and 9 AM.  Her pulse oximeter intermittently says that her heart rate is in the 30s.  She feels weak, fatigued, with these episodes.  Since last being seen in our clinic the patient reports chest pain starting today.  She has been having episodes of chest pain for the last few weeks that occur mainly with exertion.  Chest pain improves with rest.  She has a PET scan scheduled for next week.  She also presented somewhat hypertensive with blood pressures in the 180s.  She feels that this is due to chest pain and anxiety.  Blood pressure has been closer to normal on prior checks.  She feels better the longer she sits in clinic.  She does state that she had heart rates in the 30s over the weekend which made her feel short of breath and fatigued.  she denies palpitations, dyspnea, PND, orthopnea, nausea, vomiting, dizziness, syncope, edema, weight gain, or early satiety.   Review of systems complete and found to be negative unless listed in HPI.   EP Information / Studies Reviewed:    EKG is ordered today. Personal review as below.  EKG Interpretation Date/Time:  Tuesday Jun 01 2023 08:45:59 EDT Ventricular Rate:  64 PR Interval:  220 QRS Duration:  160 QT Interval:  494 QTC Calculation: 509 R Axis:   -56  Text  Interpretation: Sinus rhythm with 1st degree A-V block Right bundle branch block Left anterior fascicular block Bifascicular block Left ventricular hypertrophy with repolarization abnormality ( R in aVL , Romhilt-Estes ) Cannot rule out Septal infarct (cited on or before 17-Mar-2023) When compared with ECG of 04-May-2023 15:29, No significant change was found Confirmed by Caeley Dohrmann ([id number]) on 06/01/2023 9:01:15 AM     Risk Assessment/Calculations:             Physical Exam:   VS:  BP (!) 174/54   Pulse 68   Ht 5' 5.1" (1.654 m)   Wt 186 lb (84.4 kg)   SpO2 98%   BMI 30.86 kg/m    Wt Readings from Last 3 Encounters:  06/01/23 186 lb (84.4 kg)  05/07/23 191 lb 6.4 oz (86.8 kg)  05/04/23 183 lb (83 kg)     GEN: Well nourished, well developed in no acute distress NECK: No JVD; No carotid bruits CARDIAC: Regular rate and rhythm, no murmurs, rubs, gallops RESPIRATORY:  Clear to auscultation without rales, wheezing or rhonchi  ABDOMEN: Soft, non-tender, non-distended EXTREMITIES:  No edema; No deformity   ASSESSMENT AND PLAN:    1.  Sick sinus syndrome: Initially had a plan for pacemaker implantation.  She has since been seen and has plans for dialysis access.  She would likely benefit from pacemaker implant, though with possible dialysis upcoming, may be a better candidate for leadless.  Omarri Eich discuss this further with Dr. Roseann Condon.  2.  Aortic stenosis: Stable on most recent echo.  Plan per primary cardiology.  3.  Stage IV CKD: Followed by nephrology  4.  Hypertension: Elevated today.  Usually better controlled.  5.  Chest pain: Has plans for upcoming PET scan.  Chest pain is improving.  I told her not to exert herself until the PET scan.  She should also go to the emergency room if chest pain occurs and does not improve with rest.  Azlyn Wingler give nitroglycerin to take as needed.  Follow up with Dr. Lawana Pray pending decision on pacemaker   Signed, Layten Aiken Cortland Ding, MD

## 2023-06-03 DIAGNOSIS — I132 Hypertensive heart and chronic kidney disease with heart failure and with stage 5 chronic kidney disease, or end stage renal disease: Secondary | ICD-10-CM | POA: Diagnosis not present

## 2023-06-03 DIAGNOSIS — Z1331 Encounter for screening for depression: Secondary | ICD-10-CM | POA: Diagnosis not present

## 2023-06-03 DIAGNOSIS — H35069 Retinal vasculitis, unspecified eye: Secondary | ICD-10-CM | POA: Diagnosis not present

## 2023-06-03 DIAGNOSIS — E785 Hyperlipidemia, unspecified: Secondary | ICD-10-CM | POA: Diagnosis not present

## 2023-06-03 DIAGNOSIS — N185 Chronic kidney disease, stage 5: Secondary | ICD-10-CM | POA: Diagnosis not present

## 2023-06-03 DIAGNOSIS — Z0000 Encounter for general adult medical examination without abnormal findings: Secondary | ICD-10-CM | POA: Diagnosis not present

## 2023-06-03 DIAGNOSIS — R82998 Other abnormal findings in urine: Secondary | ICD-10-CM | POA: Diagnosis not present

## 2023-06-03 DIAGNOSIS — I5030 Unspecified diastolic (congestive) heart failure: Secondary | ICD-10-CM | POA: Diagnosis not present

## 2023-06-03 DIAGNOSIS — E039 Hypothyroidism, unspecified: Secondary | ICD-10-CM | POA: Diagnosis not present

## 2023-06-03 DIAGNOSIS — Z6833 Body mass index (BMI) 33.0-33.9, adult: Secondary | ICD-10-CM | POA: Diagnosis not present

## 2023-06-03 DIAGNOSIS — E113553 Type 2 diabetes mellitus with stable proliferative diabetic retinopathy, bilateral: Secondary | ICD-10-CM | POA: Diagnosis not present

## 2023-06-03 DIAGNOSIS — R0789 Other chest pain: Secondary | ICD-10-CM | POA: Diagnosis not present

## 2023-06-03 DIAGNOSIS — I495 Sick sinus syndrome: Secondary | ICD-10-CM | POA: Diagnosis not present

## 2023-06-03 DIAGNOSIS — Z1339 Encounter for screening examination for other mental health and behavioral disorders: Secondary | ICD-10-CM | POA: Diagnosis not present

## 2023-06-03 DIAGNOSIS — E1129 Type 2 diabetes mellitus with other diabetic kidney complication: Secondary | ICD-10-CM | POA: Diagnosis not present

## 2023-06-07 ENCOUNTER — Telehealth (HOSPITAL_COMMUNITY): Payer: Self-pay | Admitting: Emergency Medicine

## 2023-06-07 NOTE — Telephone Encounter (Signed)
 Reaching out to patient to offer assistance regarding upcoming cardiac imaging study; pt verbalizes understanding of appt date/time, parking situation and where to check in, pre-test NPO status and medications ordered, and verified current allergies; name and call back number provided for further questions should they arise Rockwell Alexandria RN Navigator Cardiac Imaging [facility] [phone] office [phone] cell

## 2023-06-08 ENCOUNTER — Encounter (HOSPITAL_COMMUNITY)
Admission: RE | Admit: 2023-06-08 | Discharge: 2023-06-08 | Disposition: A | Discharge status: Home / Self Care | Source: Ambulatory Visit | Attending: Internal Medicine | Admitting: Internal Medicine

## 2023-06-08 DIAGNOSIS — I700 Atherosclerosis of aorta: Secondary | ICD-10-CM | POA: Diagnosis not present

## 2023-06-08 DIAGNOSIS — R072 Precordial pain: Secondary | ICD-10-CM | POA: Diagnosis not present

## 2023-06-08 DIAGNOSIS — I358 Other nonrheumatic aortic valve disorders: Secondary | ICD-10-CM | POA: Insufficient documentation

## 2023-06-08 DIAGNOSIS — Z713 Dietary counseling and surveillance: Secondary | ICD-10-CM | POA: Diagnosis not present

## 2023-06-08 DIAGNOSIS — N184 Chronic kidney disease, stage 4 (severe): Secondary | ICD-10-CM | POA: Insufficient documentation

## 2023-06-08 DIAGNOSIS — I2510 Atherosclerotic heart disease of native coronary artery without angina pectoris: Secondary | ICD-10-CM | POA: Insufficient documentation

## 2023-06-08 MED ORDER — RUBIDIUM RB82 GENERATOR (RUBYFILL)
22.2200 | PACK | Freq: Once | INTRAVENOUS | Status: AC
  Administered 2023-06-08: 22.22 via INTRAVENOUS

## 2023-06-08 MED ORDER — REGADENOSON 0.4 MG/5ML IV SOLN
0.4000 mg | Freq: Once | INTRAVENOUS | Status: AC
  Administered 2023-06-08: 0.4 mg via INTRAVENOUS

## 2023-06-08 MED ORDER — RUBIDIUM RB82 GENERATOR (RUBYFILL)
21.7800 | PACK | Freq: Once | INTRAVENOUS | Status: AC
  Administered 2023-06-08: 21.78 via INTRAVENOUS

## 2023-06-08 MED ORDER — REGADENOSON 0.4 MG/5ML IV SOLN
INTRAVENOUS | Status: AC
  Filled 2023-06-08: qty 5

## 2023-06-08 NOTE — Progress Notes (Signed)
 Pt. Tolerated lexi scan well.

## 2023-06-09 ENCOUNTER — Encounter: Payer: Self-pay | Admitting: Cardiovascular Disease

## 2023-06-09 ENCOUNTER — Ambulatory Visit: Attending: Cardiovascular Disease | Admitting: Cardiovascular Disease

## 2023-06-09 ENCOUNTER — Ambulatory Visit: Payer: Self-pay | Admitting: Internal Medicine

## 2023-06-09 VITALS — BP 128/70 | HR 70 | Ht 61.0 in | Wt 189.0 lb

## 2023-06-09 DIAGNOSIS — E785 Hyperlipidemia, unspecified: Secondary | ICD-10-CM

## 2023-06-09 DIAGNOSIS — E1169 Type 2 diabetes mellitus with other specified complication: Secondary | ICD-10-CM | POA: Diagnosis not present

## 2023-06-09 DIAGNOSIS — R001 Bradycardia, unspecified: Secondary | ICD-10-CM

## 2023-06-09 DIAGNOSIS — I452 Bifascicular block: Secondary | ICD-10-CM | POA: Diagnosis not present

## 2023-06-09 DIAGNOSIS — I10 Essential (primary) hypertension: Secondary | ICD-10-CM

## 2023-06-09 DIAGNOSIS — R9431 Abnormal electrocardiogram [ECG] [EKG]: Secondary | ICD-10-CM | POA: Diagnosis not present

## 2023-06-09 DIAGNOSIS — Z01812 Encounter for preprocedural laboratory examination: Secondary | ICD-10-CM | POA: Diagnosis not present

## 2023-06-09 LAB — CBC
Hematocrit: 28.4 % — ABNORMAL LOW (ref 34.0–46.6)
Hemoglobin: 9.2 g/dL — ABNORMAL LOW (ref 11.1–15.9)
MCH: 28 pg (ref 26.6–33.0)
MCHC: 32.4 g/dL (ref 31.5–35.7)
MCV: 86 fL (ref 79–97)
Platelets: 342 10*3/uL (ref 150–450)
RBC: 3.29 x10E6/uL — ABNORMAL LOW (ref 3.77–5.28)
RDW: 13.3 % (ref 11.7–15.4)
WBC: 8 10*3/uL (ref 3.4–10.8)

## 2023-06-09 LAB — NM PET CT CARDIAC PERFUSION MULTI W/ABSOLUTE BLOODFLOW
Base ST Depression (mm): 1.5 mm
LV dias vol: 135 mL (ref 46–106)
LV sys vol: 91 mL
MBFR: 0.74
Nuc Rest EF: 33 %
Nuc Stress EF: 15 %
Peak HR: 76 {beats}/min
Rest HR: 73 {beats}/min
Rest MBF: 0.98 ml/g/min
Rest Nuclear Isotope Dose: 21.8 mCi
SDS: 25
SSS: 51
ST Depression (mm): 2.5 mm
Stress MBF: 0.73 ml/g/min
Stress Nuclear Isotope Dose: 22.2 mCi

## 2023-06-09 NOTE — Patient Instructions (Addendum)
 Medication Instructions:  Your physician recommends that you continue on your current medications as directed. Please refer to the Current Medication list given to you today.  *If you need a refill on your cardiac medications before your next appointment, please call your pharmacy*  Lab Work: CBC and BMET TODAY - [facility] is on the first floor of our building If you have labs (blood work) drawn today and your tests are completely normal, you will receive your results only by: MyChart Message (if you have MyChart) OR A paper copy in the mail If you have any lab test that is abnormal or we need to change your treatment, we will call you to review the results.  Testing/Procedures: Leadless Pacemaker - scheduled for Thursday, June 19 - we will be sending out further instructions to you  Your physician has recommended that you have a pacemaker inserted. A pacemaker is a small device that is placed under the skin of your chest or abdomen to help control abnormal heart rhythms. This device uses electrical pulses to prompt the heart to beat at a normal rate. Pacemakers are used to treat heart rhythms that are too slow. Wire (leads) are attached to the pacemaker that goes into the chambers of you heart. This is done in the hospital and usually requires and overnight stay. Please see the instruction sheet given to you today for more information.  Follow-Up: At [facility], you and your health needs are our priority.  As part of our continuing mission to provide you with exceptional heart care, our providers are all part of one team.  This team includes your primary Cardiologist (physician) and Advanced Practice Providers or APPs (Physician Assistants and Nurse Practitioners) who all work together to provide you with the care you need, when you need it.  Your next appointment:   We will schedule follow up after your pacemaker implant  Provider:   Marlane Silver, MD

## 2023-06-09 NOTE — Progress Notes (Signed)
 Electrophysiology Office Note:    Date:  06/09/2023   ID:  CAIRA POCHE, DOB 10/10/1945, MRN [id number]  PCP:  Lonzie Robins, MD   [facility] Providers Cardiologist:  Kyra Phy, MD Electrophysiologist:  Lei Pump, MD     Referring MD: Lonzie Robins, MD   History of Present Illness:    Rhonda Clayton is a 78 y.o. female with a medical history significant for sick sinus syndrome, aortic stenosis, stage IV CKD, atherosclerotic vascular disease referred for leadless pacemaker placement.     She saw Dr. Lawana Pray with sinus bradycardia and occasional pauses. The pauses occurred in the AM, usually between 7 and 9 AM.  She reports that she is usually awake at these hours.  She notes 1 episode of fatigue associated with heart rates of 40 bpm.  Unfortunately she did not notate the time on her diary, so we do not know the exact rhythm at the time of her event.      Today, she reports that she is at baseline.  She has occasional fatigue.  No syncope, no presyncope.  EKGs/Labs/Other Studies Reviewed Today:     Echocardiogram:  TTE 04/08/2023 EF 55-60%. Left atrium is mildly dilated. Degenerative mitral valve, severe mitral stenosis. Left atrium is mildly dilated.   Monitors:  11 day monitor March, 2025  -- my interpretation Sinus rhythm, HR 33-90, avg 60 1.1% burden of atrial ectopy Rare ventricular ectopy, no episodes of bigeminy Multiple sinus pauses occurred, the longer was 4.7 seconds. These occurred in the AM and over night, as late as about 9 AM or so.  There was a symptom episode of fatigue associated with hr in the 40's (per patient), but we do not have associated strips because the date/time of the symptom was not specified.   EKG:   EKG Interpretation Date/Time:  Wednesday Jun 09 2023 11:38:03 EDT Ventricular Rate:  70 PR Interval:  236 QRS Duration:  158 QT Interval:  466 QTC Calculation: 503 R Axis:   -53  Text  Interpretation: Sinus rhythm with 1st degree A-V block Right bundle branch block Left anterior fascicular block Bifascicular block Left ventricular hypertrophy with repolarization abnormality ( R in aVL , Romhilt-Estes ) Cannot rule out Septal infarct (cited on or before 17-Mar-2023) When compared with ECG of 01-Jun-2023 08:45, no significant change Confirmed by Marlane Silver [phone]) on 06/09/2023 12:18:49 PM     Physical Exam:    VS:  BP 128/70 (BP Location: Right Arm, Patient Position: Sitting, Cuff Size: Normal)   Pulse 70   Ht 5\' 1"  (1.549 m)   Wt 189 lb (85.7 kg)   BMI 35.71 kg/m     Wt Readings from Last 3 Encounters:  06/09/23 189 lb (85.7 kg)  06/01/23 186 lb (84.4 kg)  05/07/23 191 lb 6.4 oz (86.8 kg)     GEN: Well nourished, well developed in no acute distress CARDIAC: RRR, no murmurs, rubs, gallops RESPIRATORY:  Normal work of breathing MUSCULOSKELETAL: no edema    ASSESSMENT & PLAN:     Bradycardia She has had both sinus pauses, which occurred in the a.m. No symptoms were documented at this time.  She has had episodes of low pulse rates associated with fatigue. She has "trifascicular block" with RBBB, LAFB, and prolonged AV conduction With risk for complete AV block given her significant AV conduction disease and monitor findings of paroxysmal sinus pauses, a Medtronic Micra AV may be the best option to give her ventricular  backup pacing If, in the future she develops sinus bradycardia, an atrial leadless pacemaker may be placed. --I will discuss further with her primary electrophysiologist Dr. Lawana Pray but at this point, we will go ahead and reserve her a spot for the procedure. Would employ general anesthesia for leadless device placement.  I explained the logistics, risks, anticipated benefits of leadless pacemaker placement.  Specific risk explained including progression of renal failure from contrast dye (which is low, if any with the micro), device dislodgment,  cardiac perforation, death.  Stage IV CKD Followed by nephrology With plans in process for obtaining dialysis access Of some concern is that placement of a leadless pacemaker would require contrast injection, and the amount is not always trivial with the dual-chamber leadless system  Hypertension BP controlled today      Signed, Efraim Grange, MD  06/09/2023 2:36 PM    [facility]

## 2023-06-10 ENCOUNTER — Ambulatory Visit: Payer: Self-pay | Admitting: Cardiology

## 2023-06-10 ENCOUNTER — Encounter: Payer: Self-pay | Admitting: Skilled Nursing Facility1

## 2023-06-10 ENCOUNTER — Ambulatory Visit: Admitting: Skilled Nursing Facility1

## 2023-06-10 DIAGNOSIS — N184 Chronic kidney disease, stage 4 (severe): Secondary | ICD-10-CM

## 2023-06-10 DIAGNOSIS — R072 Precordial pain: Secondary | ICD-10-CM | POA: Diagnosis not present

## 2023-06-10 LAB — BASIC METABOLIC PANEL WITH GFR
BUN/Creatinine Ratio: 13 (ref 12–28)
BUN: 41 mg/dL — ABNORMAL HIGH (ref 8–27)
CO2: 17 mmol/L — ABNORMAL LOW (ref 20–29)
Calcium: 9.1 mg/dL (ref 8.7–10.3)
Chloride: 94 mmol/L — ABNORMAL LOW (ref 96–106)
Creatinine, Ser: 3.13 mg/dL — ABNORMAL HIGH (ref 0.57–1.00)
Glucose: 139 mg/dL — ABNORMAL HIGH (ref 70–99)
Potassium: 3.6 mmol/L (ref 3.5–5.2)
Sodium: 129 mmol/L — ABNORMAL LOW (ref 134–144)
eGFR: 14 mL/min/{1.73_m2} — ABNORMAL LOW (ref >59)

## 2023-06-10 NOTE — Progress Notes (Addendum)
 Medical Nutrition Therapy  Appointment Start time:  9:45  Appointment End time:  10:14  Primary concerns today: losing weight and kidney health  Referral diagnosis: n18.4   NUTRITION ASSESSMENT    Clinical Medical Hx: DM, HTN, CKD Medications: see list Labs: pt stated her last A1C was 6.7 Notable Signs/Symptoms: none stated  Lifestyle & Dietary Hx  Appt conducted via telephone: pt agreeable to the challenges of this appt type, pt identified by name and DOB  Pt states she wears the libre: pt does not know how to use her CGM to its fullest extent just know how to read her current number; pt states she does leave her reader out of range; pt states her usual morning reading is about 110-135 Pt states he lives alone.  Pt states she has cut back on meat.  Pt states she avoids soy because it Is bad for ones thyroid : Dietitian educated pt on this subject Pt states she is allergic to coffee.  Pt states she is tired most of the time due to her heart.  Pt state she is a fish snob so will not eat the vacuum sealed packets and will only eat organic chicken.   Estimated daily fluid intake:  oz Supplements:  Sleep: typically inconsistent but last night was great Stress / self-care: pt stated controlled Current average weekly physical activity: ADL's  24-Hr Dietary Recall First Meal: yogurt + applesauce or fruit + peanut butter or applesauce  Snack:  Second Meal: half pumpernickel sandwich or cream cheese with crackers Snack:  Third Meal: pinto beans or greens  Snack:  Beverages: water    NUTRITION INTERVENTION  Nutrition education (E-1) on the following topics:  Creation of balanced and diverse meals to increase the intake of nutrient-rich foods that provide essential vitamins, minerals, fiber, and phytonutrients Variety of Fruits and Vegetables: Aim for a colorful array of fruits and vegetables to ensure a wide range of nutrients. Include a mix of leafy greens, berries, citrus  fruits, cruciferous vegetables, and more. Whole Grains: Choose whole grains over refined grains. Examples include brown rice, quinoa, oats, whole wheat, and barley. Lean Proteins: Include lean sources of protein focusing more on plant based options, such as poultry, fish, tofu, legumes, beans, lentils, and low-fat dairy products. Limit red and processed meats. Healthy Fats: Incorporate sources of healthy fats, including avocados, nuts, seeds, and olive oil. Limit saturated and trans fats found in fried and processed foods. Dairy or Dairy Alternatives: Choose low-fat or fat-free dairy products, or plant-based alternatives like almond or soy milk. Portion Control: Be mindful of portion sizes to avoid overeating. Pay attention to hunger and satisfaction cues. Limit Added Sugars: Minimize the consumption of sugary beverages, snacks, and desserts. Check food labels for added sugars and opt for natural sources of sweetness such as whole fruits. Hydration: Drink plenty of water  throughout the day. Limit sugary drinks and excessive caffeine intake. Moderate Sodium Intake: Reduce the consumption of high-sodium foods. Use herbs and spices for flavor instead of excessive salt. Meal Planning and Preparation: Plan and prepare meals ahead of time to make healthier choices more convenient. Include a mix of food groups in each meal. Limit Processed Foods: Minimize the intake of highly processed and packaged foods that are often high in added sugars, salt, and unhealthy fats. Regular Physical Activity: Combine a healthy diet with regular physical activity for overall well-being. Aim for at least 150 minutes of moderate-intensity aerobic exercise per week, along with strength training. Moderation and Balance: Enjoy treats  and indulgent foods in moderation, emphasizing balance rather than strict restriction.  Handouts Provided Include  Renal healthy diet sheet  Learning Style & Readiness for  Change Teaching method utilized: Visual & Auditory  Demonstrated degree of understanding via: Teach Back  Barriers to learning/adherence to lifestyle change: none identified     MONITORING & EVALUATION Dietary intake, weekly physical activity  Next Steps  Patient is to call or email with any future questions or concerns.

## 2023-06-12 ENCOUNTER — Telehealth: Payer: Self-pay | Admitting: Student in an Organized Health Care Education/Training Program

## 2023-06-12 ENCOUNTER — Inpatient Hospital Stay (HOSPITAL_COMMUNITY)
Admission: EM | Admit: 2023-06-12 | Discharge: 2023-06-15 | DRG: 291 | Disposition: A | Discharge status: Home Health | Attending: Cardiovascular Disease | Admitting: Cardiovascular Disease

## 2023-06-12 ENCOUNTER — Encounter (HOSPITAL_COMMUNITY): Payer: Self-pay | Admitting: Emergency Medicine

## 2023-06-12 ENCOUNTER — Other Ambulatory Visit: Payer: Self-pay

## 2023-06-12 ENCOUNTER — Emergency Department (HOSPITAL_COMMUNITY)

## 2023-06-12 DIAGNOSIS — E1122 Type 2 diabetes mellitus with diabetic chronic kidney disease: Secondary | ICD-10-CM | POA: Diagnosis present

## 2023-06-12 DIAGNOSIS — I350 Nonrheumatic aortic (valve) stenosis: Secondary | ICD-10-CM

## 2023-06-12 DIAGNOSIS — Z79899 Other long term (current) drug therapy: Secondary | ICD-10-CM

## 2023-06-12 DIAGNOSIS — I5033 Acute on chronic diastolic (congestive) heart failure: Secondary | ICD-10-CM | POA: Diagnosis not present

## 2023-06-12 DIAGNOSIS — E039 Hypothyroidism, unspecified: Secondary | ICD-10-CM | POA: Diagnosis not present

## 2023-06-12 DIAGNOSIS — I5023 Acute on chronic systolic (congestive) heart failure: Secondary | ICD-10-CM

## 2023-06-12 DIAGNOSIS — N185 Chronic kidney disease, stage 5: Secondary | ICD-10-CM | POA: Diagnosis not present

## 2023-06-12 DIAGNOSIS — I495 Sick sinus syndrome: Secondary | ICD-10-CM | POA: Diagnosis present

## 2023-06-12 DIAGNOSIS — Z8249 Family history of ischemic heart disease and other diseases of the circulatory system: Secondary | ICD-10-CM | POA: Diagnosis not present

## 2023-06-12 DIAGNOSIS — L900 Lichen sclerosus et atrophicus: Secondary | ICD-10-CM | POA: Diagnosis present

## 2023-06-12 DIAGNOSIS — I428 Other cardiomyopathies: Secondary | ICD-10-CM | POA: Diagnosis present

## 2023-06-12 DIAGNOSIS — Z9049 Acquired absence of other specified parts of digestive tract: Secondary | ICD-10-CM

## 2023-06-12 DIAGNOSIS — Z79890 Hormone replacement therapy (postmenopausal): Secondary | ICD-10-CM

## 2023-06-12 DIAGNOSIS — I5031 Acute diastolic (congestive) heart failure: Secondary | ICD-10-CM | POA: Diagnosis not present

## 2023-06-12 DIAGNOSIS — N186 End stage renal disease: Secondary | ICD-10-CM | POA: Diagnosis not present

## 2023-06-12 DIAGNOSIS — K5900 Constipation, unspecified: Secondary | ICD-10-CM | POA: Diagnosis not present

## 2023-06-12 DIAGNOSIS — I110 Hypertensive heart disease with heart failure: Secondary | ICD-10-CM | POA: Diagnosis not present

## 2023-06-12 DIAGNOSIS — M1990 Unspecified osteoarthritis, unspecified site: Secondary | ICD-10-CM | POA: Diagnosis present

## 2023-06-12 DIAGNOSIS — E119 Type 2 diabetes mellitus without complications: Secondary | ICD-10-CM

## 2023-06-12 DIAGNOSIS — I5021 Acute systolic (congestive) heart failure: Secondary | ICD-10-CM | POA: Diagnosis not present

## 2023-06-12 DIAGNOSIS — E785 Hyperlipidemia, unspecified: Secondary | ICD-10-CM | POA: Diagnosis not present

## 2023-06-12 DIAGNOSIS — R0989 Other specified symptoms and signs involving the circulatory and respiratory systems: Secondary | ICD-10-CM | POA: Diagnosis not present

## 2023-06-12 DIAGNOSIS — I452 Bifascicular block: Secondary | ICD-10-CM | POA: Diagnosis present

## 2023-06-12 DIAGNOSIS — Z950 Presence of cardiac pacemaker: Secondary | ICD-10-CM

## 2023-06-12 DIAGNOSIS — M8580 Other specified disorders of bone density and structure, unspecified site: Secondary | ICD-10-CM | POA: Diagnosis not present

## 2023-06-12 DIAGNOSIS — Z794 Long term (current) use of insulin: Secondary | ICD-10-CM | POA: Diagnosis not present

## 2023-06-12 DIAGNOSIS — Z981 Arthrodesis status: Secondary | ICD-10-CM | POA: Diagnosis not present

## 2023-06-12 DIAGNOSIS — Z789 Other specified health status: Secondary | ICD-10-CM | POA: Diagnosis not present

## 2023-06-12 DIAGNOSIS — Z833 Family history of diabetes mellitus: Secondary | ICD-10-CM | POA: Diagnosis not present

## 2023-06-12 DIAGNOSIS — Z9109 Other allergy status, other than to drugs and biological substances: Secondary | ICD-10-CM

## 2023-06-12 DIAGNOSIS — I10 Essential (primary) hypertension: Secondary | ICD-10-CM | POA: Diagnosis present

## 2023-06-12 DIAGNOSIS — Z9181 History of falling: Secondary | ICD-10-CM | POA: Diagnosis not present

## 2023-06-12 DIAGNOSIS — I132 Hypertensive heart and chronic kidney disease with heart failure and with stage 5 chronic kidney disease, or end stage renal disease: Secondary | ICD-10-CM | POA: Diagnosis not present

## 2023-06-12 DIAGNOSIS — Z881 Allergy status to other antibiotic agents status: Secondary | ICD-10-CM

## 2023-06-12 DIAGNOSIS — I872 Venous insufficiency (chronic) (peripheral): Secondary | ICD-10-CM | POA: Diagnosis present

## 2023-06-12 DIAGNOSIS — I214 Non-ST elevation (NSTEMI) myocardial infarction: Secondary | ICD-10-CM | POA: Diagnosis present

## 2023-06-12 DIAGNOSIS — Z91018 Allergy to other foods: Secondary | ICD-10-CM

## 2023-06-12 DIAGNOSIS — R001 Bradycardia, unspecified: Secondary | ICD-10-CM | POA: Diagnosis not present

## 2023-06-12 DIAGNOSIS — N184 Chronic kidney disease, stage 4 (severe): Secondary | ICD-10-CM | POA: Diagnosis present

## 2023-06-12 DIAGNOSIS — I5032 Chronic diastolic (congestive) heart failure: Secondary | ICD-10-CM | POA: Diagnosis not present

## 2023-06-12 DIAGNOSIS — R0689 Other abnormalities of breathing: Secondary | ICD-10-CM | POA: Diagnosis not present

## 2023-06-12 DIAGNOSIS — I5043 Acute on chronic combined systolic (congestive) and diastolic (congestive) heart failure: Secondary | ICD-10-CM | POA: Diagnosis not present

## 2023-06-12 DIAGNOSIS — R0602 Shortness of breath: Secondary | ICD-10-CM | POA: Diagnosis present

## 2023-06-12 DIAGNOSIS — Z66 Do not resuscitate: Secondary | ICD-10-CM | POA: Diagnosis not present

## 2023-06-12 DIAGNOSIS — I509 Heart failure, unspecified: Secondary | ICD-10-CM

## 2023-06-12 DIAGNOSIS — I2510 Atherosclerotic heart disease of native coronary artery without angina pectoris: Secondary | ICD-10-CM | POA: Diagnosis present

## 2023-06-12 DIAGNOSIS — I5030 Unspecified diastolic (congestive) heart failure: Secondary | ICD-10-CM | POA: Diagnosis present

## 2023-06-12 DIAGNOSIS — R9439 Abnormal result of other cardiovascular function study: Secondary | ICD-10-CM | POA: Diagnosis present

## 2023-06-12 DIAGNOSIS — Z882 Allergy status to sulfonamides status: Secondary | ICD-10-CM

## 2023-06-12 DIAGNOSIS — Z887 Allergy status to serum and vaccine status: Secondary | ICD-10-CM

## 2023-06-12 LAB — RESP PANEL BY RT-PCR (RSV, FLU A&B, COVID)  RVPGX2
Influenza A by PCR: NEGATIVE
Influenza B by PCR: NEGATIVE
Resp Syncytial Virus by PCR: NEGATIVE
SARS Coronavirus 2 by RT PCR: NEGATIVE

## 2023-06-12 LAB — BASIC METABOLIC PANEL WITH GFR
Anion gap: 13 (ref 5–15)
BUN: 55 mg/dL — ABNORMAL HIGH (ref 8–23)
CO2: 18 mmol/L — ABNORMAL LOW (ref 22–32)
Calcium: 8.9 mg/dL (ref 8.9–10.3)
Chloride: 96 mmol/L — ABNORMAL LOW (ref 98–111)
Creatinine, Ser: 3.2 mg/dL — ABNORMAL HIGH (ref 0.44–1.00)
GFR, Estimated: 14 mL/min — ABNORMAL LOW (ref >60)
Glucose, Bld: 141 mg/dL — ABNORMAL HIGH (ref 70–99)
Potassium: 3.2 mmol/L — ABNORMAL LOW (ref 3.5–5.1)
Sodium: 127 mmol/L — ABNORMAL LOW (ref 135–145)

## 2023-06-12 LAB — TROPONIN I (HIGH SENSITIVITY)
Troponin I (High Sensitivity): 579 ng/L (ref <18)
Troponin I (High Sensitivity): 550 ng/L (ref <18)

## 2023-06-12 LAB — CBC
HCT: 29.3 % — ABNORMAL LOW (ref 36.0–46.0)
Hemoglobin: 9.3 g/dL — ABNORMAL LOW (ref 12.0–15.0)
MCH: 27.9 pg (ref 26.0–34.0)
MCHC: 31.7 g/dL (ref 30.0–36.0)
MCV: 88 fL (ref 80.0–100.0)
Platelets: 434 10*3/uL — ABNORMAL HIGH (ref 150–400)
RBC: 3.33 MIL/uL — ABNORMAL LOW (ref 3.87–5.11)
RDW: 13.7 % (ref 11.5–15.5)
WBC: 9.3 10*3/uL (ref 4.0–10.5)
nRBC: 0 % (ref 0.0–0.2)

## 2023-06-12 LAB — BRAIN NATRIURETIC PEPTIDE: B Natriuretic Peptide: 1627.4 pg/mL — ABNORMAL HIGH (ref 0.0–100.0)

## 2023-06-12 MED ORDER — ASPIRIN 81 MG PO CHEW
324.0000 mg | CHEWABLE_TABLET | Freq: Once | ORAL | Status: AC
  Administered 2023-06-12: 324 mg via ORAL
  Filled 2023-06-12: qty 4

## 2023-06-12 MED ORDER — FUROSEMIDE 10 MG/ML IJ SOLN
20.0000 mg | Freq: Once | INTRAMUSCULAR | Status: AC
  Administered 2023-06-12: 20 mg via INTRAVENOUS
  Filled 2023-06-12: qty 4

## 2023-06-12 MED ORDER — HEPARIN (PORCINE) 25000 UT/250ML-% IV SOLN
1300.0000 [IU]/h | INTRAVENOUS | Status: DC
  Administered 2023-06-12: 1000 [IU]/h via INTRAVENOUS
  Administered 2023-06-13: 1200 [IU]/h via INTRAVENOUS
  Administered 2023-06-14 – 2023-06-15 (×2): 1300 [IU]/h via INTRAVENOUS
  Filled 2023-06-12 (×4): qty 250

## 2023-06-12 MED ORDER — HEPARIN BOLUS VIA INFUSION
4000.0000 [IU] | Freq: Once | INTRAVENOUS | Status: AC
  Administered 2023-06-12: 4000 [IU] via INTRAVENOUS
  Filled 2023-06-12: qty 4000

## 2023-06-12 MED ORDER — ATORVASTATIN CALCIUM 80 MG PO TABS
80.0000 mg | ORAL_TABLET | Freq: Once | ORAL | Status: DC
  Filled 2023-06-12: qty 2

## 2023-06-12 NOTE — ED Provider Notes (Signed)
 [place] EMERGENCY DEPARTMENT AT [facility] Provider Note  CSN: [id number] Arrival date & time: 06/12/23 1812  Chief Complaint(s) Shortness of Breath (Pt had a stress test completed this week, and has had increasing sob since. Pt was called to come in, told she has "blockages," and has pacemaker insertion for 6/19. States "all three branches of my heart are broken" hx low kidney function. No copd hx, no home o2 use )  HPI Rhonda Clayton is a 78 y.o. female with past medical history as below, significant for CKD, HLD, HTN, DM, diastolic HF who presents to the ED with complaint of dyspnea.   Pt reports after recent stress test has developed worsening dyspnea. Unable to lie flat at night time, dyspnea noted with exertion. Feels she cannot catch her breath even when stationary.  Does not use home O2. She has some chest tightness. No fever. She has occ cough with clear/white sputum. She is scheduled for PPM next month Dr Arlester Ladd  Stress test reviewed " Findings are consistent with infarction with peri-infarct ischemia in virtually the entire LAD artery distribution. The study is high risk." "EF 15%" Cardilogy Dr Jason Mess called pt regarding abnormal stress and advised to report to ED  Past Medical History Past Medical History:  Diagnosis Date   Anemia    CKD (chronic kidney disease)    Cystocele    DJD (degenerative joint disease)    DM (diabetes mellitus) (HCC)    Esophageal stricture    Essential hypertension    Hyperlipidemia    Hypothyroidism    Lichen sclerosus    Osteopenia    RBBB with left anterior fascicular block    Rectocele    Stress incontinence    Torn rotator cuff    Bilateral   Type 2 diabetes mellitus (HCC)    Venous insufficiency    Vitreous hemorrhage, left (HCC) 05/25/2019   The nature of the vitreous hemorrhage was discussed with the patient as well as the common causes.   Patients with diabetic eye disease may develop retinal neovascularization.   Other eye conditions develop retinal  neovascularization secondary to retinal venous occlusions.   Vitreous hemorrhage may result from spontaneous vitreous detachment or retinal breaks.  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 determined by examination associated with type 2 diabetes mellitus (HCC) 05/25/2019   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 Authorizing Provider  amLODipine  (NORVASC ) 2.5 MG tablet Take 5 mg by mouth at bedtime.    [provider]  B-D UF III MINI PEN NEEDLES 31G X 5 MM MISC Inject into the skin 2 (  two) times daily. 03/01/23   [provider]  betamethasone dipropionate (DIPROLENE) 0.05 % cream Apply 1 Application topically as needed (skin condition).    [provider]  betamethasone valerate (VALISONE) 0.1 % cream SMARTSIG:sparingly Topical Daily 02/23/23   [provider]  Continuous Glucose Sensor (FREESTYLE LIBRE 3 SENSOR) MISC change every 14 days topically to monitor blood glucose  continuously 28 days 03/01/23   [provider]  furosemide  (LASIX ) 40 MG tablet Take 20-40 mg by mouth See admin instructions. Take 40 by mouth once daily on Monday, Wednesday, Friday. Take 20 mg once daily on Tuesday, Thursday, Saturday, and Sunday.    [provider]  hydrALAZINE (APRESOLINE) 25 MG tablet Take 25 mg by mouth 3 (three) times daily. 02/22/23   [provider]  Lancets (ONETOUCH DELICA PLUS [place]) MISC Apply topically 3 (three) times daily. 02/22/23   [provider]  levothyroxine  (SYNTHROID ) 100 MCG tablet Take 100 mcg by mouth daily before breakfast.    [provider]  MECLIZINE HCL PO Take 0.5 tablets by mouth as needed (dizziness).    [provider]  nitroGLYCERIN  (NITROSTAT ) 0.4 MG SL tablet Place 1 tablet (0.4 mg total) under the tongue every 5 (five) minutes as needed for chest pain. 06/01/23 08/30/23  Camnitz, Babetta Lesch, MD  NOVOLOG  MIX 70/30 FLEXPEN (70-30) 100 UNIT/ML FlexPen Inject 20 Units into the skin in the morning and at bedtime.    [provider]  [facility] VERIO test strip 3 (three) times daily. 03/03/23   [provider]                                                                                                                                    Past Surgical History Past Surgical History:  Procedure Laterality Date   ABDOMINAL EXPOSURE N/A 05/09/2014   Procedure: ABDOMINAL EXPOSURE;  Surgeon: Mayo Speck, MD;  Location: [facility] NEUR[facility] ORS;  Service: Vascular;  Laterality: N/A;   ANTERIOR LAT LUMBAR FUSION Right 05/09/2014   Procedure: Lumbar three-four, Lumbar four-five Anterior lateral lumbar fusion;  Surgeon: Manya Sells, MD;  Location: [facility]UR[facility] ORS;  Service: Neurosurgery;  Laterality: Right;   ANTERIOR LUMBAR FUSION N/A 05/09/2014   Procedure: Stage 1:Lumbar four-five, Lumbar five-Sacral one Anterior lumbar interbody fusion with Dr. Iran Douglas for approach ;  Surgeon: Manya Sells, MD;   Location: [facility]UR[facility]S;  Service: Neurosurgery;  Laterality: N/A;   CHOLECYSTECTOMY  1987   INCONTINENCE SURGERY     LUMBAR PERCUTANEOUS PEDICLE SCREW 3 LEVEL N/A 05/10/2014   Procedure: Stage 2: Percutaneous Pedicle Screws; Laminectomy L3-4  ;  Surgeon: Manya Sells, MD;  Location: [facility]UR[facility] ORS;  Service: Neurosurgery;  Laterality: N/A;  Stage 2: Percutaneous Pedicle Screws T10 to Ileum; Laminectomy L3-4     RECTAL SURGERY     VAGINA SURGERY  2018   [place] Fat Touch- Vaginal Dryness   Family History Family  History  Problem Relation Age of Onset   Multiple myeloma Mother    Cancer Maternal Aunt        Cancer in the mouth - dipped snuff   Heart disease Brother    Diabetes Father    Colon polyps Brother    Heart disease Maternal Uncle    Heart disease Paternal Uncle    Heart disease Paternal Grandfather     Social History Social History   Tobacco Use   Smoking status: Never   Smokeless tobacco: Never  Vaping Use   Vaping status: Never Used  Substance Use Topics   Alcohol  use: No   Drug use: Yes    Types: Hydromorphone    Allergies Coffee flavoring agent (non-screening), Azithromycin , Fluogen [influenza virus vaccine], Molds & smuts, Pneumococcal vac polyvalent, Cephalexin , Ciprofloxacin, Oatmeal, and Sulfa antibiotics  Review of Systems A thorough review of systems was obtained and all systems are negative except as noted in the HPI and PMH.   Physical Exam Vital Signs  I have reviewed the triage vital signs BP (!) 141/66   Pulse 73   Temp 98.5 F (36.9 C) (Oral)   Resp (!) 21   SpO2 95%  Physical Exam Vitals and nursing note reviewed.  Constitutional:      General: She is not in acute distress.    Appearance: Normal appearance.  HENT:     Head: Normocephalic and atraumatic.     Right Ear: External ear normal.     Left Ear: External ear normal.     Nose: Nose normal.     Mouth/Throat:     Mouth: Mucous membranes are moist.  Eyes:     General: No scleral  icterus.       Right eye: No discharge.        Left eye: No discharge.  Cardiovascular:     Rate and Rhythm: Normal rate and regular rhythm.     Pulses: Normal pulses.     Heart sounds: Normal heart sounds.  Pulmonary:     Effort: Pulmonary effort is normal. No respiratory distress.     Breath sounds: Decreased air movement present. No stridor. Rales present.  Abdominal:     General: Abdomen is flat. There is no distension.     Palpations: Abdomen is soft.     Tenderness: There is no abdominal tenderness.  Musculoskeletal:     Cervical back: No rigidity.     Right lower leg: Edema present.     Left lower leg: Edema present.     Comments: Trace LE edema b/l  Skin:    General: Skin is warm and dry.     Capillary Refill: Capillary refill takes less than 2 seconds.  Neurological:     Mental Status: She is alert.  Psychiatric:        Mood and Affect: Mood normal.        Behavior: Behavior normal. Behavior is cooperative.     ED Results and Treatments Labs (all labs ordered are listed, but only abnormal results are displayed) Labs Reviewed  BASIC METABOLIC PANEL WITH GFR - Abnormal; Notable for the following components:      Result Value   Sodium 127 (*)    Potassium 3.2 (*)    Chloride 96 (*)    CO2 18 (*)    Glucose, Bld 141 (*)    BUN 55 (*)    Creatinine, Ser 3.20 (*)    GFR, Estimated 14 (*)    All other  components within normal limits  CBC - Abnormal; Notable for the following components:   RBC 3.33 (*)    Hemoglobin 9.3 (*)    HCT 29.3 (*)    Platelets 434 (*)    All other components within normal limits  BRAIN NATRIURETIC PEPTIDE - Abnormal; Notable for the following components:   B Natriuretic Peptide 1,627.4 (*)    All other components within normal limits  TROPONIN I (HIGH SENSITIVITY) - Abnormal; Notable for the following components:   Troponin I (High Sensitivity) 579 (*)    All other components within normal limits  TROPONIN I (HIGH SENSITIVITY) -  Abnormal; Notable for the following components:   Troponin I (High Sensitivity) 550 (*)    All other components within normal limits  RESP PANEL BY RT-PCR (RSV, FLU A&B, COVID)  RVPGX2  CBC  HEPARIN  LEVEL (UNFRACTIONATED)                                                                                                                          Radiology DG Chest 2 View Result Date: 06/12/2023 CLINICAL DATA:  Short of breath, dyspnea EXAM: CHEST - 2 VIEW COMPARISON:  01/03/2023, 06/08/2023 FINDINGS: Frontal and lateral views of the chest demonstrate an enlarged cardiac silhouette. There is increased pulmonary vascular congestion with bilateral ground-glass airspace disease. There are small bilateral pleural effusions. No evidence of pneumothorax. Postsurgical changes from prior thoracolumbar spinal fusion. IMPRESSION: 1. Constellation of findings most consistent with congestive heart failure. Electronically Signed   By: Bobbye Burrow M.D.   On: 06/12/2023 20:14    Pertinent labs & imaging results that were available during my care of the patient were reviewed by me and considered in my medical decision making (see MDM for details).  Medications Ordered in ED Medications  heparin  ADULT infusion 100 units/mL (25000 units/250mL) (1,000 Units/hr Intravenous New Bag/Given 06/12/23 2203)  atorvastatin (LIPITOR) tablet 80 mg (80 mg Oral Not Given 06/12/23 2209)  furosemide  (LASIX ) injection 20 mg (20 mg Intravenous Given 06/12/23 2119)  heparin  bolus via infusion 4,000 Units (4,000 Units Intravenous Bolus from Bag 06/12/23 2203)  aspirin chewable tablet 324 mg (324 mg Oral Given 06/12/23 2204)                                                                                                                                     Procedures .Critical Care  Performed by: Teddi Favors, DO Authorized by: Teddi Favors, DO   Critical care provider statement:    Critical care time (minutes):  80   Critical  care time was exclusive of:  Separately billable procedures and treating other patients   Critical care was necessary to treat or prevent imminent or life-threatening deterioration of the following conditions:  Cardiac failure   Critical care was time spent personally by me on the following activities:  Development of treatment plan with patient or surrogate, discussions with consultants, evaluation of patient's response to treatment, examination of patient, ordering and review of laboratory studies, ordering and review of radiographic studies, ordering and performing treatments and interventions, pulse oximetry, re-evaluation of patient's condition, review of old charts and obtaining history from patient or surrogate   Care discussed with: admitting provider     (including critical care time)  Medical Decision Making / ED Course    Medical Decision Making:    TERRIKA ZUVER is a 78 y.o. female with past medical history as below, significant for CKD, HLD, HTN, DM, diastolic HF who presents to the ED with complaint of dyspnea. . The complaint involves an extensive differential diagnosis and also carries with it a high risk of complications and morbidity.  Serious etiology was considered. Ddx includes but is not limited to: In my evaluation of this patient's dyspnea my DDx includes, but is not limited to, pneumonia, pulmonary embolism, pneumothorax, pulmonary edema, metabolic acidosis, asthma, COPD, cardiac cause, anemia, anxiety, etc.    Complete initial physical exam performed, notably the patient was in no acute distress, she does have increased WOB, mild conversational dyspnea.  Reviewed and confirmed nursing documentation for past medical history, family history, social history.  Vital signs reviewed.     Brief summary:  78 yo female hx CKD, diastolic HF, recent abnormal stress test here w/ dib Worsening DIB since stress test on 5/20 Stress test was abnormal " Findings are consistent  with infarction with peri-infarct ischemia in virtually the entire LAD artery distribution. The study is high risk." "EF 15%" Cardilogy Dr Jason Mess called pt regarding abnormal stress and advised to report to ED   Clinical Course as of 06/12/23 2247  Sat Jun 12, 2023  1937 Creatinine(!): 3.20 Similar to prior [SG]  2105 CXR c/w CHF [SG]  2109 B Natriuretic Peptide(!): 1,627.4 C/w acute chf which correlates with her presentation [SG]  2109 Troponin I (High Sensitivity)(!!): 579 She has chest tightness, dyspnea, abnormal recent stress test. Will start heparin  [SG]  2153 Spoke w/ cardiology Will admit pt, Dr Sanjuana Crutch Requesting give atorvastatin 80mg  and asa 324mg . Admit [facility] card tele [SG]    Clinical Course User Index [SG] Teddi Favors, DO   She has recent abnormal stress test, she appears to have acute chf exacerbation today. Trop is also elevated, EKG w/o stemi Start heparin , give lasix  although judicious give CKD and hyponatremia. Given 3LNC for comfort, seems to be improving her breathing subjectively; no hypoxia  She did not want to start statin, will defer to cardiology to d/w her  d/w cardiology Dr Sanjuana Crutch Plan to admit             Additional history obtained: -Additional history obtained from family -External records from outside source obtained and reviewed including: Chart review including previous notes, labs, imaging, consultation notes including  Prior stress Prior labs Home meds   Lab Tests: -I ordered, reviewed, and interpreted labs.   The pertinent results include:  Labs Reviewed  BASIC METABOLIC PANEL WITH GFR - Abnormal; Notable for the following components:      Result Value   Sodium 127 (*)    Potassium 3.2 (*)    Chloride 96 (*)    CO2 18 (*)    Glucose, Bld 141 (*)    BUN 55 (*)    Creatinine, Ser 3.20 (*)    GFR, Estimated 14 (*)    All other components within normal limits  CBC - Abnormal; Notable for the following components:   RBC  3.33 (*)    Hemoglobin 9.3 (*)    HCT 29.3 (*)    Platelets 434 (*)    All other components within normal limits  BRAIN NATRIURETIC PEPTIDE - Abnormal; Notable for the following components:   B Natriuretic Peptide 1,627.4 (*)    All other components within normal limits  TROPONIN I (HIGH SENSITIVITY) - Abnormal; Notable for the following components:   Troponin I (High Sensitivity) 579 (*)    All other components within normal limits  TROPONIN I (HIGH SENSITIVITY) - Abnormal; Notable for the following components:   Troponin I (High Sensitivity) 550 (*)    All other components within normal limits  RESP PANEL BY RT-PCR (RSV, FLU A&B, COVID)  RVPGX2  CBC  HEPARIN  LEVEL (UNFRACTIONATED)    Notable for as above, CHF  EKG   EKG Interpretation Date/Time:    Ventricular Rate:    PR Interval:    QRS Duration:    QT Interval:    QTC Calculation:   R Axis:      Text Interpretation:           Imaging Studies ordered: I ordered imaging studies including CXR I independently visualized the following imaging with scope of interpretation limited to determining acute life threatening conditions related to emergency care; findings noted above I agree with the radiologist interpretation If any imaging was obtained with contrast I closely monitored patient for any possible adverse reaction a/w contrast administration in the emergency department   Medicines ordered and prescription drug management: Meds ordered this encounter  Medications   furosemide  (LASIX ) injection 20 mg   heparin  bolus via infusion 4,000 Units   heparin  ADULT infusion 100 units/mL (25000 units/267mL)   aspirin chewable tablet 324 mg   atorvastatin (LIPITOR) tablet 80 mg    -I have reviewed the patients home medicines and have made adjustments as needed   Consultations Obtained: I requested consultation with the cardiology,  and discussed lab and imaging findings as well as pertinent plan - they recommend:  admit to cards   Cardiac Monitoring: The patient was maintained on a cardiac monitor.  I personally viewed and interpreted the cardiac monitored which showed an underlying rhythm of: nsr Continuous pulse oximetry interpreted by myself, 98% on 3L.    Social Determinants of Health:  Diagnosis or treatment significantly limited by social determinants of health: na   Reevaluation: After the interventions noted above, I reevaluated the patient and found that they have improved  Co morbidities that complicate the patient evaluation  Past Medical History:  Diagnosis Date   Anemia    CKD (chronic kidney disease)    Cystocele    DJD (degenerative joint disease)    DM (diabetes mellitus) (HCC)    Esophageal stricture    Essential hypertension    Hyperlipidemia    Hypothyroidism    Lichen sclerosus    Osteopenia    RBBB with left anterior fascicular block  Rectocele    Stress incontinence    Torn rotator cuff    Bilateral   Type 2 diabetes mellitus (HCC)    Venous insufficiency    Vitreous hemorrhage, left (HCC) 05/25/2019   The nature of the vitreous hemorrhage was discussed with the patient as well as the common causes.   Patients with diabetic eye disease may develop retinal neovascularization.  Other eye conditions develop retinal  neovascularization secondary to retinal venous occlusions.   Vitreous hemorrhage may result from spontaneous vitreous detachment or retinal breaks.  Blunt trauma is a common cause as wl      Dispostion: Disposition decision including need for hospitalization was considered, and patient admitted to the hospital.    Final Clinical Impression(s) / ED Diagnoses Final diagnoses:  Acute diastolic congestive heart failure (HCC)        Teddi Favors, DO 06/12/23 2247

## 2023-06-12 NOTE — Progress Notes (Signed)
 PHARMACY - ANTICOAGULATION CONSULT NOTE  Pharmacy Consult for heparin  Indication: chest pain/ACS  Allergies  Allergen Reactions   Coffee Flavoring Agent (Non-Screening) Other (See Comments)    Sick on stomach, sneezing, backed up   Azithromycin  Other (See Comments)   Fluogen [Influenza Virus Vaccine]    Molds & Smuts     Allergy test showed    Pneumococcal Vac Polyvalent Other (See Comments)    Large site reaction    Cephalexin  Other (See Comments)    Yeast infection   Ciprofloxacin Other (See Comments)    tendinitis   Oatmeal Other (See Comments)    Sneezing and drainage   Sulfa Antibiotics Other (See Comments)    Yeast infection    Patient Measurements:    Vital Signs: Temp: 98.5 F (36.9 C) (05/24 1819) Temp Source: Oral (05/24 1819) BP: 141/66 (05/24 2115) Pulse Rate: 73 (05/24 2115)  Labs: Recent Labs    06/12/23 1856 06/12/23 1945  HGB 9.3*  --   HCT 29.3*  --   PLT 434*  --   CREATININE 3.20*  --   TROPONINIHS  --  579*    Estimated Creatinine Clearance: 13.9 mL/min (A) (by C-G formula based on SCr of 3.2 mg/dL (H)).   Medical History: Past Medical History:  Diagnosis Date   Anemia    CKD (chronic kidney disease)    Cystocele    DJD (degenerative joint disease)    DM (diabetes mellitus) (HCC)    Esophageal stricture    Essential hypertension    Hyperlipidemia    Hypothyroidism    Lichen sclerosus    Osteopenia    RBBB with left anterior fascicular block    Rectocele    Stress incontinence    Torn rotator cuff    Bilateral   Type 2 diabetes mellitus (HCC)    Venous insufficiency    Vitreous hemorrhage, left (HCC) 05/25/2019   The nature of the vitreous hemorrhage was discussed with the patient as well as the common causes.   Patients with diabetic eye disease may develop retinal neovascularization.  Other eye conditions develop retinal  neovascularization secondary to retinal venous occlusions.   Vitreous hemorrhage may result from  spontaneous vitreous detachment or retinal breaks.  Blunt trauma is a common cause as wl      Assessment: 78 yo female reports after recent stress test has developed worsening dyspnea.  Pharmacy to dose heparin  for ASC/STEMI.  No prior AC noted  Hgb 9.3, plts 434, trop 579, Scr 3.2  Goal of Therapy:  Heparin  level 0.3-0.7 units/ml Monitor platelets by anticoagulation protocol: Yes   Plan:  Heparin  bolus 4000 units x 1 Start heparin  drip at 1000 units/hr Heparin  level in 8 hours Daily CBC  Beau Bound RPh 06/12/2023, 9:22 PM

## 2023-06-12 NOTE — ED Notes (Signed)
 I SENT A BLUE TOP TO LAB

## 2023-06-12 NOTE — ED Notes (Signed)
 Report called to [place] RN at [facility]

## 2023-06-12 NOTE — H&P (Signed)
 Cardiology Admission History and Physical:   [name] MRN: [id number]; DOB: 02-09-1945   Admission date: 06/12/2023  Primary Care Provider: Lonzie Robins, MD Primary Cardiologist: Kyra Phy, MD  Primary Electrophysiologist:  Will Cortland Ding, MD   Chief Complaint:  Shortness fo Breath   Patient Profile:   Rhonda Clayton is a 78 y.o. female with moderate aortic stenosis, CKD V, diabetes mellitus   History of Present Illness:   Rhonda Clayton presents to the ED for shortness of breath.   Rhonda Clayton has been having symptoms since March 2025.  Initially she began having shortness of breath while completing her IADLs at home. A stress test was ordered at that time scheduled for Jun 08, 2023.  Since March she has continued to have worsening of her symptoms. In the last 2 weeks she developed substernal chest pain with rest. Her stress test showed signs of ischemia and infarction in the LAD territiry. Since her stress test she has had significant shortness of breath that has worsened with as the days progressed. This evening she called the on-call cardiology pager to notify the on-call physician of her difficulty breathing. When on the phone it was noted that she had significant conversational dyspnea. She was recommended to present to the Emergency Department.     She presented to [facility] with a heart rate of 78, blood pressure 150/72, oxygen saturation 95% on room air.  Labs showed a creatinine of 3.2 with a GFR of 14. Initial hs-troponin of 579 followed by 550.  BNP of 1600.  Chest x-ray demonstrated bilateral interstitial infiltrates concerning for pulmonary edema.  He was given 20 of IV Lasix  with minimal urine output.  Past Medical History:  Diagnosis Date   Anemia    CKD (chronic kidney disease)    Cystocele    DJD (degenerative joint disease)    DM (diabetes mellitus) (HCC)    Esophageal stricture    Essential hypertension    Hyperlipidemia     Hypothyroidism    Lichen sclerosus    Osteopenia    RBBB with left anterior fascicular block    Rectocele    Stress incontinence    Torn rotator cuff    Bilateral   Type 2 diabetes mellitus (HCC)    Venous insufficiency    Vitreous hemorrhage, left (HCC) 05/25/2019   The nature of the vitreous hemorrhage was discussed with the patient as well as the common causes.   Patients with diabetic eye disease may develop retinal neovascularization.  Other eye conditions develop retinal  neovascularization secondary to retinal venous occlusions.   Vitreous hemorrhage may result from spontaneous vitreous detachment or retinal breaks.  Blunt trauma is a common cause as wl    Past Surgical History:  Procedure Laterality Date   ABDOMINAL EXPOSURE N/A 05/09/2014   Procedure: ABDOMINAL EXPOSURE;  Surgeon: Mayo Speck, MD;  Location: [facility];  Service: Vascular;  Laterality: N/A;   ANTERIOR LAT LUMBAR FUSION Right 05/09/2014   Procedure: Lumbar three-four, Lumbar four-five Anterior lateral lumbar fusion;  Surgeon: Manya Sells, MD;  Location: [facility];  Service: Neurosurgery;  Laterality: Right;   ANTERIOR LUMBAR FUSION N/A 05/09/2014   Procedure: Stage 1:Lumbar four-five, Lumbar five-Sacral one Anterior lumbar interbody fusion with Dr. Lakiya Douglas for approach ;  Surgeon: Manya Sells, MD;  Location: [facility];  Service: Neurosurgery;  Laterality: N/A;   CHOLECYSTECTOMY  1987   INCONTINENCE SURGERY     LUMBAR PERCUTANEOUS PEDICLE  SCREW 3 LEVEL N/A 05/10/2014   Procedure: Stage 2: Percutaneous Pedicle Screws; Laminectomy L3-4  ;  Surgeon: Manya Sells, MD;  Location: [facility];  Service: Neurosurgery;  Laterality: N/A;  Stage 2: Percutaneous Pedicle Screws T10 to Ileum; Laminectomy L3-4     RECTAL SURGERY     VAGINA SURGERY  2018   T[place]i Fat Touch- Vaginal Dryness     Medications Prior to Admission: Prior to Admission medications   Medication Sig Start Date End Date Taking? Authorizing  Provider  amLODipine  (NORVASC ) 2.5 MG tablet Take 5 mg by mouth at bedtime.    [provider]  B-D UF III MINI PEN NEEDLES 31G X 5 MM MISC Inject into the skin 2 (two) times daily. 03/01/23   [provider]  betamethasone dipropionate (DIPROLENE) 0.05 % cream Apply 1 Application topically as needed (skin condition).    [provider]  betamethasone valerate (VALISONE) 0.1 % cream SMARTSIG:sparingly Topical Daily 02/23/23   [provider]  Continuous Glucose Sensor (FREESTYLE LIBRE 3 SENSOR) MISC change every 14 days topically to monitor blood glucose continuously 28 days 03/01/23   [provider]  furosemide  (LASIX ) 40 MG tablet Take 20-40 mg by mouth See admin instructions. Take 40 by mouth once daily on Monday, Wednesday, Friday. Take 20 mg once daily on Tuesday, Thursday, Saturday, and Sunday.    [provider]  hydrALAZINE (APRESOLINE) 25 MG tablet Take 25 mg by mouth 3 (three) times daily. 02/22/23   [provider]  Lancets (ONETOUCH DELICA PLUS [place]) MISC Apply topically 3 (three) times daily. 02/22/23   [provider]  levothyroxine  (SYNTHROID ) 100 MCG tablet Take 100 mcg by mouth daily before breakfast.    [provider]  MECLIZINE HCL PO Take 0.5 tablets by mouth as needed (dizziness).    [provider]  nitroGLYCERIN  (NITROSTAT ) 0.4 MG SL tablet Place 1 tablet (0.4 mg total) under the tongue every 5 (five) minutes as needed for chest pain. 06/01/23 08/30/23  Camnitz, Babetta Lesch, MD  NOVOLOG  MIX 70/30 FLEXPEN (70-30) 100 UNIT/ML FlexPen Inject 20 Units into the skin in the morning and at bedtime.    [provider]  [facility] VERIO test strip 3 (three) times daily. 03/03/23   [provider]     Allergies:    Allergies  Allergen Reactions   Coffee Flavoring Agent (Non-Screening) Other (See Comments)    Sick on stomach, sneezing, backed up   Azithromycin  Other (See Comments)    Fluogen [Influenza Virus Vaccine]    Molds & Smuts     Allergy test showed    Pneumococcal Vac Polyvalent Other (See Comments)    Large site reaction    Cephalexin  Other (See Comments)    Yeast infection   Ciprofloxacin Other (See Comments)    tendinitis   Oatmeal Other (See Comments)    Sneezing and drainage   Sulfa Antibiotics Other (See Comments)    Yeast infection    Social History:   Social History   Socioeconomic History   Marital status: Widowed    Spouse name: Not on file   Number of children: 2   Years of education: Not on file   Highest education level: Not on file  Occupational History   Occupation: Retired  Tobacco Use   Smoking status: Never   Smokeless tobacco: Never  Vaping Use   Vaping status: Never Used  Substance and Sexual Activity   Alcohol  use: No   Drug use: Yes  Types: Hydromorphone    Sexual activity: Not on file  Other Topics Concern   Not on file  Social History Narrative   Not on file   Social Drivers of Health   Financial Resource Strain: Not on file  Food Insecurity: No Food Insecurity (01/03/2023)   Hunger Vital Sign    Worried About Running Out of Food in the Last Year: Never true    Ran Out of Food in the Last Year: Never true  Transportation Needs: No Transportation Needs (01/03/2023)   PRAPARE - Administrator, Civil Service (Medical): No    Lack of Transportation (Non-Medical): No  Physical Activity: Not on file  Stress: Not on file  Social Connections: Not on file  Intimate Partner Violence: Not At Risk (01/03/2023)   Humiliation, Afraid, Rape, and Kick questionnaire    Fear of Current or Ex-Partner: No    Emotionally Abused: No    Physically Abused: No    Sexually Abused: No    Family History:   The patient's family history includes Cancer in her maternal aunt; Colon polyps in her brother; Diabetes in her father; Heart disease in her brother, maternal uncle, paternal grandfather, and paternal uncle;  Multiple myeloma in her mother.    Review of Systems: [y] = yes, [ ]  = no    General: Weight gain [ ] ; Weight loss [ ] ; Anorexia [ ] ; Fatigue [ ] ; Fever [ ] ; Chills [ ] ; Weakness [ ]   Cardiac: Chest pain/pressure Davis.Dad ]; Resting SOB [ y]; Exertional SOB [ y]; Orthopnea [ ] ; Pedal Edema [ ] ; Palpitations [ ] ; Syncope [ ] ; Presyncope [ ] ; Paroxysmal nocturnal dyspnea[ ]   Pulmonary: Cough [ ] ; Wheezing[ ] ; Hemoptysis[ ] ; Sputum [ ] ; Snoring [ ]   GI: Vomiting[ ] ; Dysphagia[ ] ; Melena[ ] ; Hematochezia [ ] ; Heartburn[ ] ; Abdominal pain [ ] ; Constipation [ ] ; Diarrhea [ ] ; BRBPR [ ]   GU: Hematuria[ ] ; Dysuria [ ] ; Nocturia[ ]   Vascular: Pain in legs with walking [ ] ; Pain in feet with lying flat [ ] ; Non-healing sores [ ] ; Stroke [ ] ; TIA [ ] ; Slurred speech [ ] ;  Neuro: Headaches[ ] ; Vertigo[ ] ; Seizures[ ] ; Paresthesias[ ] ;Blurred vision [ ] ; Diplopia [ ] ; Vision changes [ ]   Ortho/Skin: Arthritis [ ] ; Joint pain [ ] ; Muscle pain [ ] ; Joint swelling [ ] ; Back Pain [ ] ; Rash [ ]   Psych: Depression[ ] ; Anxiety[ ]   Heme: Bleeding problems [ ] ; Clotting disorders [ ] ; Anemia [ ]   Endocrine: Diabetes [ ] ; Thyroid  dysfunction[ ]   Physical Exam/Data:   Vitals:   06/12/23 2145 06/12/23 2200 06/12/23 2259 06/12/23 2300  BP: 127/62 132/66    Pulse: 70 75    Resp: (!) 23 (!) 22    Temp:   (!) 97.5 F (36.4 C)   TempSrc:   Oral   SpO2: 98% 99%    Weight:    86.2 kg    Intake/Output Summary (Last 24 hours) at 06/12/2023 2325 Last data filed at 06/12/2023 2300 Gross per 24 hour  Intake 0 ml  Output 890 ml  Net -890 ml   Filed Weights   06/12/23 2300  Weight: 86.2 kg   Body mass index is 35.9 kg/m.  General:  Well nourished, well developed, conversational dyspnea present HEENT: normal Lymph: no adenopathy Neck: Elevated JVP Cardiac:  normal S1, S2; 2 systolic murmurs are present. High-pitch III/VI systolic murmur in the right upper sternal border. Another lower pitch systolic murmur  in the  left fifth intercostal space Lungs: Rales present Abd: soft, nontender, no hepatomegaly  Ext: no edema Musculoskeletal:  No deformities, BUE and BLE strength normal and equal Skin: warm and dry  Psych:  Normal affect    EKG:  EKG 06/12/2023 at 2058-normal sinus rhythm with left axis deviation and left ventricular hypertrophy. IVCD. T wave inversions present in V1 and V2 which are seen in EKGs earlier in May and back in February 2025. Prolonged QTc present.  Personally reviewed  Relevant CV Studies: ECHO 03/2023  FINDINGS   Left Ventricle: Left ventricular ejection fraction, by estimation, is 55  to 60%. Left ventricular ejection fraction by 3D volume is 62 %. The left  ventricle has normal function. The left ventricle has no regional wall  motion abnormalities. The average  left ventricular global longitudinal strain is -19.8 %. Strain was  performed and the global longitudinal strain is normal. The left  ventricular internal cavity size was normal in size. There is mild left  ventricular hypertrophy. Left ventricular  diastolic parameters are indeterminate.   2. Right ventricular systolic function is normal. The right ventricular  size is normal. There is normal pulmonary artery systolic pressure. The  estimated right ventricular systolic pressure is 24.3 mmHg.   3. Left atrial size was mildly dilated.   4. The mitral valve is degenerative. Mild mitral valve regurgitation. No  evidence of mitral stenosis. Severe mitral annular calcification.   5. The aortic valve is abnormal. There is severe calcifcation of the  aortic valve. Aortic valve regurgitation is not visualized. Moderate  aortic valve stenosis. Aortic valve area, by VTI measures 1.10 cm. Aortic  valve mean gradient measures 21.0 mmHg.  Aortic valve Vmax measures 3.16 m/s.   6. The inferior vena cava is normal in size with greater than 50%  respiratory variability, suggesting right atrial pressure of 3 mmHg.   Laboratory  Data:  Chemistry Recent Labs  Lab 06/09/23 1330 06/12/23 1856  NA 129* 127*  K 3.6 3.2*  CL 94* 96*  CO2 17* 18*  GLUCOSE 139* 141*  BUN 41* 55*  CREATININE 3.13* 3.20*  CALCIUM 9.1 8.9  GFRNONAA  --  14*  ANIONGAP  --  13    No results for input(s): "PROT", "ALBUMIN", "AST", "ALT", "ALKPHOS", "BILITOT" in the last 168 hours. Hematology Recent Labs  Lab 06/09/23 1330 06/12/23 1856  WBC 8.0 9.3  RBC 3.29* 3.33*  HGB 9.2* 9.3*  HCT 28.4* 29.3*  MCV 86 88.0  MCH 28.0 27.9  MCHC 32.4 31.7  RDW 13.3 13.7  PLT 342 434*   Cardiac EnzymesNo results for input(s): "TROPONINI" in the last 168 hours. No results for input(s): "TROPIPOC" in the last 168 hours.  BNP Recent Labs  Lab 06/12/23 1945  BNP 1,627.4*    DDimer No results for input(s): "DDIMER" in the last 168 hours.  Radiology/Studies:  DG Chest 2 View Result Date: 06/12/2023 CLINICAL DATA:  Short of breath, dyspnea EXAM: CHEST - 2 VIEW COMPARISON:  01/03/2023, 06/08/2023 FINDINGS: Frontal and lateral views of the chest demonstrate an enlarged cardiac silhouette. There is increased pulmonary vascular congestion with bilateral ground-glass airspace disease. There are small bilateral pleural effusions. No evidence of pneumothorax. Postsurgical changes from prior thoracolumbar spinal fusion. IMPRESSION: 1. Constellation of findings most consistent with congestive heart failure. Electronically Signed   By: Bobbye Burrow M.D.   On: 06/12/2023 20:14    Assessment and Plan:   Rhonda Clayton is a 78 y.o.  female with moderate aortic stenosis, CKD V, diabetes mellitus who presents to the emergency room for shortness of breath found to have pulmonary edema in the setting of an abnormal stress test with a newly reduced ejection fraction.  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 Her troponin is elevated, with a relatively small delta of 20. Her stress test from a few days prior did reveal the LAD territory was concerning for infarct with an area concerning for inducible ischemia, so it is very likely that she had a recurrent ischemic event. Additionally, her troponin levels could also be elevated in part due to reduced kidney function. Additionally given her CKD V (gFR 14) a coronary angiogram could lead to irreversible damage of her kidney function and hasten the need for hemodialysis.  -ASA 325 mg  -Heparin  gtt with infusion  -Atorvastatin 80 mg    Moderate Aortic Stenosis with Normal GLS  Will continue to monitor   Hypothyroidism  -Continue levothyroxine    Diabetes Mellitus - Continue insulin     Hypertension -Continue amlodipine    Severity of Illness: The appropriate patient status for this patient is INPATIENT. Inpatient status is judged to be reasonable and necessary in order to provide the required intensity of service to ensure the patient's safety. The patient's presenting symptoms, physical exam findings, and initial radiographic and laboratory data in the context of their chronic comorbidities is felt to place them at high risk for further clinical deterioration. Furthermore, it is not anticipated that the patient will be medically stable for discharge from the hospital within 2 midnights of admission.   * I certify that at the point of admission it is my clinical judgment that the patient will require inpatient hospital care spanning beyond 2 midnights from the point of admission due to high intensity of service, high risk for further deterioration and high frequency of surveillance required.*   For questions or updates, please contact [facility] Please consult [email or web address] for contact info under        Signed, Renelda Carry, MD  06/12/2023 11:25 PM

## 2023-06-12 NOTE — Telephone Encounter (Signed)
 TELEPHONE CALL with ON-CALL PHYSICIAN    Rhonda Clayton paged the on-call cardiology physician pager with concerns for shortness of breath that begun after her stress test four days ago on 06/08/2023. While talking, she had conversational dyspnea. The findings of the stress test are consistent with ischemia and infarction in the LAD territory.  Given her symptoms and her abnormal stress test, I asked her to present to the emergency room for evaluation.    Velvet Gibbs, MD MS  Overnight Cardiology [employer]

## 2023-06-13 ENCOUNTER — Other Ambulatory Visit: Payer: Self-pay

## 2023-06-13 DIAGNOSIS — I214 Non-ST elevation (NSTEMI) myocardial infarction: Secondary | ICD-10-CM | POA: Insufficient documentation

## 2023-06-13 DIAGNOSIS — I350 Nonrheumatic aortic (valve) stenosis: Secondary | ICD-10-CM | POA: Diagnosis not present

## 2023-06-13 DIAGNOSIS — I5033 Acute on chronic diastolic (congestive) heart failure: Secondary | ICD-10-CM

## 2023-06-13 DIAGNOSIS — R001 Bradycardia, unspecified: Secondary | ICD-10-CM | POA: Diagnosis not present

## 2023-06-13 LAB — CBC
HCT: 26.4 % — ABNORMAL LOW (ref 36.0–46.0)
Hemoglobin: 8.6 g/dL — ABNORMAL LOW (ref 12.0–15.0)
MCH: 27.9 pg (ref 26.0–34.0)
MCHC: 32.6 g/dL (ref 30.0–36.0)
MCV: 85.7 fL (ref 80.0–100.0)
Platelets: 446 10*3/uL — ABNORMAL HIGH (ref 150–400)
RBC: 3.08 MIL/uL — ABNORMAL LOW (ref 3.87–5.11)
RDW: 13.7 % (ref 11.5–15.5)
WBC: 6.7 10*3/uL (ref 4.0–10.5)
nRBC: 0 % (ref 0.0–0.2)

## 2023-06-13 LAB — BASIC METABOLIC PANEL WITH GFR
Anion gap: 14 (ref 5–15)
BUN: 50 mg/dL — ABNORMAL HIGH (ref 8–23)
CO2: 19 mmol/L — ABNORMAL LOW (ref 22–32)
Calcium: 8.7 mg/dL — ABNORMAL LOW (ref 8.9–10.3)
Chloride: 98 mmol/L (ref 98–111)
Creatinine, Ser: 3.19 mg/dL — ABNORMAL HIGH (ref 0.44–1.00)
GFR, Estimated: 14 mL/min — ABNORMAL LOW (ref >60)
Glucose, Bld: 215 mg/dL — ABNORMAL HIGH (ref 70–99)
Potassium: 3.4 mmol/L — ABNORMAL LOW (ref 3.5–5.1)
Sodium: 131 mmol/L — ABNORMAL LOW (ref 135–145)

## 2023-06-13 LAB — LIPID PANEL
Cholesterol: 154 mg/dL (ref 0–200)
HDL: 35 mg/dL — ABNORMAL LOW (ref >40)
LDL Cholesterol: 99 mg/dL (ref 0–99)
Total CHOL/HDL Ratio: 4.4 ratio
Triglycerides: 99 mg/dL (ref <150)
VLDL: 20 mg/dL (ref 0–40)

## 2023-06-13 LAB — HEPARIN LEVEL (UNFRACTIONATED)
Heparin Unfractionated: 0.17 [IU]/mL — ABNORMAL LOW (ref 0.30–0.70)
Heparin Unfractionated: 0.3 [IU]/mL (ref 0.30–0.70)

## 2023-06-13 LAB — GLUCOSE, CAPILLARY
Glucose-Capillary: 187 mg/dL — ABNORMAL HIGH (ref 70–99)
Glucose-Capillary: 128 mg/dL — ABNORMAL HIGH (ref 70–99)
Glucose-Capillary: 170 mg/dL — ABNORMAL HIGH (ref 70–99)
Glucose-Capillary: 118 mg/dL — ABNORMAL HIGH (ref 70–99)

## 2023-06-13 MED ORDER — INSULIN ASPART PROT & ASPART (70-30 MIX) 100 UNIT/ML ~~LOC~~ SUSP
15.0000 [IU] | Freq: Two times a day (BID) | SUBCUTANEOUS | Status: DC
  Administered 2023-06-13: 15 [IU] via SUBCUTANEOUS
  Administered 2023-06-13: 12 [IU] via SUBCUTANEOUS
  Administered 2023-06-14: 13 [IU] via SUBCUTANEOUS
  Administered 2023-06-14 – 2023-06-15 (×2): 15 [IU] via SUBCUTANEOUS
  Filled 2023-06-13: qty 10

## 2023-06-13 MED ORDER — HYDRALAZINE HCL 25 MG PO TABS
25.0000 mg | ORAL_TABLET | Freq: Three times a day (TID) | ORAL | Status: DC
Start: 2023-06-13 — End: 2023-06-15
  Administered 2023-06-13 – 2023-06-15 (×7): 25 mg via ORAL
  Filled 2023-06-13 (×7): qty 1

## 2023-06-13 MED ORDER — POTASSIUM CHLORIDE CRYS ER 20 MEQ PO TBCR
20.0000 meq | EXTENDED_RELEASE_TABLET | Freq: Two times a day (BID) | ORAL | Status: AC
  Administered 2023-06-13 – 2023-06-14 (×4): 20 meq via ORAL
  Filled 2023-06-13 (×4): qty 1

## 2023-06-13 MED ORDER — ASPIRIN 300 MG RE SUPP: 300.0000 mg | RECTAL | Status: DC

## 2023-06-13 MED ORDER — ACETAMINOPHEN 325 MG PO TABS: 650.0000 mg | ORAL_TABLET | ORAL | Status: DC | PRN

## 2023-06-13 MED ORDER — ASPIRIN 81 MG PO CHEW: 324.0000 mg | CHEWABLE_TABLET | ORAL | Status: DC

## 2023-06-13 MED ORDER — ASPIRIN 81 MG PO TBEC
81.0000 mg | DELAYED_RELEASE_TABLET | Freq: Every day | ORAL | Status: DC
  Administered 2023-06-14 – 2023-06-15 (×2): 81 mg via ORAL
  Filled 2023-06-13 (×2): qty 1

## 2023-06-13 MED ORDER — FUROSEMIDE 10 MG/ML IJ SOLN
60.0000 mg | Freq: Once | INTRAMUSCULAR | Status: AC
  Administered 2023-06-13: 60 mg via INTRAVENOUS
  Filled 2023-06-13: qty 6

## 2023-06-13 MED ORDER — HEPARIN BOLUS VIA INFUSION
2000.0000 [IU] | Freq: Once | INTRAVENOUS | Status: AC
  Administered 2023-06-13: 2000 [IU] via INTRAVENOUS
  Filled 2023-06-13: qty 2000

## 2023-06-13 MED ORDER — ATORVASTATIN CALCIUM 40 MG PO TABS
40.0000 mg | ORAL_TABLET | Freq: Every day | ORAL | Status: DC
  Administered 2023-06-13 – 2023-06-14 (×2): 40 mg via ORAL
  Filled 2023-06-13 (×2): qty 1

## 2023-06-13 MED ORDER — INSULIN ASPART 100 UNIT/ML IJ SOLN
0.0000 [IU] | Freq: Three times a day (TID) | INTRAMUSCULAR | Status: DC
  Administered 2023-06-13: 1 [IU] via SUBCUTANEOUS
  Administered 2023-06-14: 5 [IU] via SUBCUTANEOUS
  Administered 2023-06-14 – 2023-06-15 (×4): 2 [IU] via SUBCUTANEOUS

## 2023-06-13 MED ORDER — NITROGLYCERIN 0.4 MG SL SUBL: 0.4000 mg | SUBLINGUAL_TABLET | SUBLINGUAL | Status: DC | PRN

## 2023-06-13 MED ORDER — LEVOTHYROXINE SODIUM 100 MCG PO TABS
100.0000 ug | ORAL_TABLET | Freq: Every day | ORAL | Status: DC
  Administered 2023-06-13 – 2023-06-15 (×3): 100 ug via ORAL
  Filled 2023-06-13 (×3): qty 1

## 2023-06-13 MED ORDER — FUROSEMIDE 10 MG/ML IJ SOLN
120.0000 mg | Freq: Once | INTRAVENOUS | Status: DC
  Administered 2023-06-13: 120 mg via INTRAVENOUS
  Filled 2023-06-13: qty 12

## 2023-06-13 MED ORDER — AMLODIPINE BESYLATE 5 MG PO TABS
5.0000 mg | ORAL_TABLET | Freq: Every day | ORAL | Status: DC
  Administered 2023-06-13 – 2023-06-14 (×2): 5 mg via ORAL
  Filled 2023-06-13 (×2): qty 1

## 2023-06-13 NOTE — Progress Notes (Signed)
 Paged Dr. Sanjuana Crutch regarding patients code status. The patient discussed code status with her children and made the decision to be DNR. Per Dr. Sanjuana Crutch, he will attempt to see patient tonight. If he is unable, code status change will be addressed in the morning.

## 2023-06-13 NOTE — Progress Notes (Signed)
 PHARMACY - ANTICOAGULATION CONSULT NOTE  Pharmacy Consult for heparin  Indication: chest pain/ACS  Allergies  Allergen Reactions   Coffee Flavoring Agent (Non-Screening) Other (See Comments)    Sick on stomach, sneezing, backed up   Azithromycin  Other (See Comments)   Fluogen [Influenza Virus Vaccine]    Molds & Smuts     Allergy test showed    Pneumococcal Vac Polyvalent Other (See Comments)    Large site reaction    Cephalexin  Other (See Comments)    Yeast infection   Ciprofloxacin Other (See Comments)    tendinitis   Oatmeal Other (See Comments)    Sneezing and drainage   Sulfa Antibiotics Other (See Comments)    Yeast infection    Patient Measurements: Height: 5\' 1"  (154.9 cm) Weight: 85.4 kg (188 lb 3.2 oz) IBW/kg (Calculated) : 47.8 HEPARIN  DW (KG): 67.4  Vital Signs: Temp: 98.1 F (36.7 C) (05/25 1607) Temp Source: Oral (05/25 1607) BP: 124/60 (05/25 1607) Pulse Rate: 65 (05/25 1607)  Labs: Recent Labs    06/12/23 1856 06/12/23 1945 06/12/23 2115 06/13/23 0804 06/13/23 1846  HGB 9.3*  --   --  8.6*  --   HCT 29.3*  --   --  26.4*  --   PLT 434*  --   --  446*  --   HEPARINUNFRC  --   --   --  0.17* 0.30  CREATININE 3.20*  --   --  3.19*  --   TROPONINIHS  --  579* 550*  --   --     Estimated Creatinine Clearance: 13.9 mL/min (A) (by C-G formula based on SCr of 3.19 mg/dL (H)).   Medical History: Past Medical History:  Diagnosis Date   Anemia    CKD (chronic kidney disease)    Cystocele    DJD (degenerative joint disease)    DM (diabetes mellitus) (HCC)    Esophageal stricture    Essential hypertension    Hyperlipidemia    Hypothyroidism    Lichen sclerosus    Osteopenia    RBBB with left anterior fascicular block    Rectocele    Stress incontinence    Torn rotator cuff    Bilateral   Type 2 diabetes mellitus (HCC)    Venous insufficiency    Vitreous hemorrhage, left (HCC) 05/25/2019   The nature of the vitreous hemorrhage was  discussed with the patient as well as the common causes.   Patients with diabetic eye disease may develop retinal neovascularization.  Other eye conditions develop retinal  neovascularization secondary to retinal venous occlusions.   Vitreous hemorrhage may result from spontaneous vitreous detachment or retinal breaks.  Blunt trauma is a common cause as wl      Assessment: 78 yo female reports after recent stress test has developed worsening dyspnea.  Pharmacy to dose heparin  for ASC/STEMI.  No prior AC noted.   5/25 PM: Heparin  level therapeutic at 0.30 on heparin  infusion running at 1200 units/hr. No bleeding concerns or issues with the line per RN. Hgb stable at 8.6. Will plan to increase slightly to push towards middle of goal range and recheck a confirmatory level in 8 hours.   Goal of Therapy:  Heparin  level 0.3-0.7 units/ml Monitor platelets by anticoagulation protocol: Yes   Plan:  Increase heparin  to 1250 units/hr Check heparin  level in 8 hours Measure daily CBC Monitor for s/sx of bleeding   Thank you for involving pharmacy in the patient's care.   Leonora Ramus, PharmD,  BCPS PGY2 Pharmacy Resident

## 2023-06-13 NOTE — Progress Notes (Signed)
 PHARMACY - ANTICOAGULATION CONSULT NOTE  Pharmacy Consult for heparin  Indication: chest pain/ACS  Allergies  Allergen Reactions   Coffee Flavoring Agent (Non-Screening) Other (See Comments)    Sick on stomach, sneezing, backed up   Azithromycin  Other (See Comments)   Fluogen [Influenza Virus Vaccine]    Molds & Smuts     Allergy test showed    Pneumococcal Vac Polyvalent Other (See Comments)    Large site reaction    Cephalexin  Other (See Comments)    Yeast infection   Ciprofloxacin Other (See Comments)    tendinitis   Oatmeal Other (See Comments)    Sneezing and drainage   Sulfa Antibiotics Other (See Comments)    Yeast infection    Patient Measurements: Height: 5\' 1"  (154.9 cm) Weight: 85.4 kg (188 lb 3.2 oz) IBW/kg (Calculated) : 47.8 HEPARIN  DW (KG): 67.4  Vital Signs: Temp: 97.7 F (36.5 C) (05/25 0820) Temp Source: Oral (05/25 0820) BP: 124/64 (05/25 0820) Pulse Rate: 67 (05/25 0303)  Labs: Recent Labs    06/12/23 1856 06/12/23 1945 06/12/23 2115 06/13/23 0804  HGB 9.3*  --   --  8.6*  HCT 29.3*  --   --  26.4*  PLT 434*  --   --  446*  HEPARINUNFRC  --   --   --  0.17*  CREATININE 3.20*  --   --   --   TROPONINIHS  --  579* 550*  --     Estimated Creatinine Clearance: 13.9 mL/min (A) (by C-G formula based on SCr of 3.2 mg/dL (H)).   Medical History: Past Medical History:  Diagnosis Date   Anemia    CKD (chronic kidney disease)    Cystocele    DJD (degenerative joint disease)    DM (diabetes mellitus) (HCC)    Esophageal stricture    Essential hypertension    Hyperlipidemia    Hypothyroidism    Lichen sclerosus    Osteopenia    RBBB with left anterior fascicular block    Rectocele    Stress incontinence    Torn rotator cuff    Bilateral   Type 2 diabetes mellitus (HCC)    Venous insufficiency    Vitreous hemorrhage, left (HCC) 05/25/2019   The nature of the vitreous hemorrhage was discussed with the patient as well as the common  causes.   Patients with diabetic eye disease may develop retinal neovascularization.  Other eye conditions develop retinal  neovascularization secondary to retinal venous occlusions.   Vitreous hemorrhage may result from spontaneous vitreous detachment or retinal breaks.  Blunt trauma is a common cause as wl      Assessment: 78 yo female reports after recent stress test has developed worsening dyspnea.  Pharmacy to dose heparin  for ASC/STEMI.  No prior AC noted.   Heparin  level subtherapeutic at 0.17 on 1000 units/hr. Hgb 8.6, plt 446. No signs of bleeding or issues with infusion running continuously per RN.   Goal of Therapy:  Heparin  level 0.3-0.7 units/ml Monitor platelets by anticoagulation protocol: Yes   Plan:  Heparin  bolus 2000 units x 1 Inc heparin  to 1200 units/hr Heparin  level in 8 hours Daily CBC Monitor for s/sx of bleeding   Thank you for involving pharmacy in the patient's care.   Barbra Boone, PharmD PGY1 Acute Care Pharmacy Resident  06/13/2023 10:10 AM

## 2023-06-13 NOTE — Progress Notes (Signed)
 Progress Note  Patient Name: Rhonda Clayton Date of Encounter: 06/13/2023  Primary Cardiologist: Arun K Thukkani, MD   Subjective   No chest pain. Dyspnea is improved but still present.   Inpatient Medications    Scheduled Meds:  amLODipine   5 mg Oral QHS   [START ON 06/14/2023] aspirin EC  81 mg Oral Daily   atorvastatin  40 mg Oral QHS   hydrALAZINE  25 mg Oral TID   insulin  aspart  0-9 Units Subcutaneous TID WC   insulin  aspart protamine- aspart  15 Units Subcutaneous BID WC   levothyroxine   100 mcg Oral Q0600   Continuous Infusions:  heparin  1,000 Units/hr (06/12/23 2203)   PRN Meds: acetaminophen , nitroGLYCERIN    Vital Signs    Vitals:   06/13/23 0017 06/13/23 0018 06/13/23 0303 06/13/23 0820  BP:  (!) 143/65 (!) 143/59 124/64  Pulse:  68 67   Resp:  20 (!) 22 20  Temp:  98.5 F (36.9 C) 98 F (36.7 C) 97.7 F (36.5 C)  TempSrc:  Oral Oral Oral  SpO2:  97% 95%   Weight: 85.4 kg     Height: 5\' 1"  (1.549 m)       Intake/Output Summary (Last 24 hours) at 06/13/2023 0916 Last data filed at 06/13/2023 4540 Gross per 24 hour  Intake 0 ml  Output 2390 ml  Net -2390 ml   Filed Weights   06/12/23 2300 06/13/23 0017  Weight: 86.2 kg 85.4 kg    Telemetry    NSR at 71/min - Personally Reviewed  ECG    none - Personally Reviewed  Physical Exam   GEN: No acute distress.   Neck: No JVD Cardiac: RRR, 2/6 systolic murmurs, S2 is soft Respiratory: Clear to auscultation bilaterally. GI: Soft, nontender, non-distended  MS: No edema; No deformity. Neuro:  Nonfocal  Psych: Normal affect   Labs    Chemistry Recent Labs  Lab 06/09/23 1330 06/12/23 1856  NA 129* 127*  K 3.6 3.2*  CL 94* 96*  CO2 17* 18*  GLUCOSE 139* 141*  BUN 41* 55*  CREATININE 3.13* 3.20*  CALCIUM 9.1 8.9  GFRNONAA  --  14*  ANIONGAP  --  13     Hematology Recent Labs  Lab 06/09/23 1330 06/12/23 1856  WBC 8.0 9.3  RBC 3.29* 3.33*  HGB 9.2* 9.3*  HCT 28.4* 29.3*   MCV 86 88.0  MCH 28.0 27.9  MCHC 32.4 31.7  RDW 13.3 13.7  PLT 342 434*    Cardiac EnzymesNo results for input(s): "TROPONINI" in the last 168 hours. No results for input(s): "TROPIPOC" in the last 168 hours.   BNP Recent Labs  Lab 06/12/23 1945  BNP 1,627.4*     DDimer No results for input(s): "DDIMER" in the last 168 hours.   Radiology    DG Chest 2 View Result Date: 06/12/2023 CLINICAL DATA:  Short of breath, dyspnea EXAM: CHEST - 2 VIEW COMPARISON:  01/03/2023, 06/08/2023 FINDINGS: Frontal and lateral views of the chest demonstrate an enlarged cardiac silhouette. There is increased pulmonary vascular congestion with bilateral ground-glass airspace disease. There are small bilateral pleural effusions. No evidence of pneumothorax. Postsurgical changes from prior thoracolumbar spinal fusion. IMPRESSION: 1. Constellation of findings most consistent with congestive heart failure. Electronically Signed   By: Bobbye Burrow M.D.   On: 06/12/2023 20:14    Cardiac Studies   2D echo performed 2 months ago demonstrates mod AS and normal LV function  Patient Profile  78 y.o. 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 She will likely soon need HD. AS - on exam her valve sounds worse than the gradient, ? Low flow/low gradient AS? Bradycardia - note plan for leadless PPM. She is currently in the 60's.  For questions or updates, please contact [facility] Please consult [email or web address] for contact info under Cardiology/STEMI.      Signed, Manya Sells, MD  06/13/2023, 9:16 AM

## 2023-06-14 ENCOUNTER — Inpatient Hospital Stay (HOSPITAL_COMMUNITY)

## 2023-06-14 DIAGNOSIS — R001 Bradycardia, unspecified: Secondary | ICD-10-CM | POA: Diagnosis not present

## 2023-06-14 DIAGNOSIS — I350 Nonrheumatic aortic (valve) stenosis: Secondary | ICD-10-CM

## 2023-06-14 DIAGNOSIS — I214 Non-ST elevation (NSTEMI) myocardial infarction: Secondary | ICD-10-CM

## 2023-06-14 DIAGNOSIS — I2510 Atherosclerotic heart disease of native coronary artery without angina pectoris: Secondary | ICD-10-CM

## 2023-06-14 DIAGNOSIS — I5043 Acute on chronic combined systolic (congestive) and diastolic (congestive) heart failure: Secondary | ICD-10-CM | POA: Diagnosis not present

## 2023-06-14 LAB — CBC
HCT: 25 % — ABNORMAL LOW (ref 36.0–46.0)
Hemoglobin: 8.2 g/dL — ABNORMAL LOW (ref 12.0–15.0)
MCH: 28.2 pg (ref 26.0–34.0)
MCHC: 32.8 g/dL (ref 30.0–36.0)
MCV: 85.9 fL (ref 80.0–100.0)
Platelets: 415 10*3/uL — ABNORMAL HIGH (ref 150–400)
RBC: 2.91 MIL/uL — ABNORMAL LOW (ref 3.87–5.11)
RDW: 13.8 % (ref 11.5–15.5)
WBC: 6.2 10*3/uL (ref 4.0–10.5)
nRBC: 0 % (ref 0.0–0.2)

## 2023-06-14 LAB — GLUCOSE, CAPILLARY
Glucose-Capillary: 159 mg/dL — ABNORMAL HIGH (ref 70–99)
Glucose-Capillary: 187 mg/dL — ABNORMAL HIGH (ref 70–99)
Glucose-Capillary: 255 mg/dL — ABNORMAL HIGH (ref 70–99)
Glucose-Capillary: 154 mg/dL — ABNORMAL HIGH (ref 70–99)
Glucose-Capillary: 113 mg/dL — ABNORMAL HIGH (ref 70–99)

## 2023-06-14 LAB — ECHOCARDIOGRAM COMPLETE
AR max vel: 0.85 cm2
AV Area VTI: 0.89 cm2
AV Area mean vel: 0.81 cm2
AV Mean grad: 26 mmHg
AV Peak grad: 38.2 mmHg
Ao pk vel: 3.09 m/s
Area-P 1/2: 4.6 cm2
Height: 61 in
S' Lateral: 3.6 cm
Weight: 2990.4 [oz_av]

## 2023-06-14 LAB — BASIC METABOLIC PANEL WITH GFR
Anion gap: 9 (ref 5–15)
BUN: 51 mg/dL — ABNORMAL HIGH (ref 8–23)
CO2: 21 mmol/L — ABNORMAL LOW (ref 22–32)
Calcium: 8.7 mg/dL — ABNORMAL LOW (ref 8.9–10.3)
Chloride: 100 mmol/L (ref 98–111)
Creatinine, Ser: 3.19 mg/dL — ABNORMAL HIGH (ref 0.44–1.00)
GFR, Estimated: 14 mL/min — ABNORMAL LOW (ref >60)
Glucose, Bld: 193 mg/dL — ABNORMAL HIGH (ref 70–99)
Potassium: 3.7 mmol/L (ref 3.5–5.1)
Sodium: 130 mmol/L — ABNORMAL LOW (ref 135–145)

## 2023-06-14 LAB — HEPARIN LEVEL (UNFRACTIONATED): Heparin Unfractionated: 0.31 [IU]/mL (ref 0.30–0.70)

## 2023-06-14 NOTE — Progress Notes (Signed)
 PHARMACY - ANTICOAGULATION CONSULT NOTE  Pharmacy Consult for heparin  Indication: chest pain/ACS  Allergies  Allergen Reactions   Coffee Flavoring Agent (Non-Screening) Other (See Comments)    Sick on stomach, sneezing, backed up   Azithromycin  Other (See Comments)   Fluogen [Influenza Virus Vaccine]    Molds & Smuts     Allergy test showed    Pneumococcal Vac Polyvalent Other (See Comments)    Large site reaction    Cephalexin  Other (See Comments)    Yeast infection   Ciprofloxacin Other (See Comments)    tendinitis   Oatmeal Other (See Comments)    Sneezing and drainage   Sulfa Antibiotics Other (See Comments)    Yeast infection    Patient Measurements: Height: 5\' 1"  (154.9 cm) Weight: 84.8 kg (186 lb 14.4 oz) IBW/kg (Calculated) : 47.8 HEPARIN  DW (KG): 67.4  Vital Signs: Temp: 97.8 F (36.6 C) (05/26 0807) Temp Source: Oral (05/26 0807) BP: 105/62 (05/26 0807) Pulse Rate: 68 (05/26 0807)  Labs: Recent Labs    06/12/23 1856 06/12/23 1945 06/12/23 2115 06/13/23 0804 06/13/23 1846 06/14/23 0437  HGB 9.3*  --   --  8.6*  --  8.2*  HCT 29.3*  --   --  26.4*  --  25.0*  PLT 434*  --   --  446*  --  415*  HEPARINUNFRC  --   --   --  0.17* 0.30 0.31  CREATININE 3.20*  --   --  3.19*  --   --   TROPONINIHS  --  579* 550*  --   --   --     Estimated Creatinine Clearance: 13.9 mL/min (A) (by C-G formula based on SCr of 3.19 mg/dL (H)).   Medical History: Past Medical History:  Diagnosis Date   Anemia    CKD (chronic kidney disease)    Cystocele    DJD (degenerative joint disease)    DM (diabetes mellitus) (HCC)    Esophageal stricture    Essential hypertension    Hyperlipidemia    Hypothyroidism    Lichen sclerosus    Osteopenia    RBBB with left anterior fascicular block    Rectocele    Stress incontinence    Torn rotator cuff    Bilateral   Type 2 diabetes mellitus (HCC)    Venous insufficiency    Vitreous hemorrhage, left (HCC) 05/25/2019    The nature of the vitreous hemorrhage was discussed with the patient as well as the common causes.   Patients with diabetic eye disease may develop retinal neovascularization.  Other eye conditions develop retinal  neovascularization secondary to retinal venous occlusions.   Vitreous hemorrhage may result from spontaneous vitreous detachment or retinal breaks.  Blunt trauma is a common cause as wl      Assessment: 78 yo female reports after recent stress test has developed worsening dyspnea.  Pharmacy to dose heparin  for ASC/STEMI.  No prior AC noted.   Confirmatory heparin  level just therapeutic at 0.31 on heparin  infusion running at 1250 units/hr. No bleeding concerns or issues with the line per RN. Hgb stable at 8.2, plt wnl. Will plan to increase slightly to push towards middle of goal range and check a daily level since level is stable.  Goal of Therapy:  Heparin  level 0.3-0.7 units/ml Monitor platelets by anticoagulation protocol: Yes   Plan:  Increase heparin  to 1300 units/hr Daily heparin  level, CBC Monitor for s/sx of bleeding F/u longterm AC plans  Thank you for  involving pharmacy in the patient's care.   Jerrel Mor, PharmD PGY1 Pharmacy Resident 06/14/2023 9:06 AM

## 2023-06-14 NOTE — Progress Notes (Signed)
 Progress Note  Patient Name: Rhonda Clayton Date of Encounter: 06/14/2023  Primary Cardiologist: Arun K Thukkani, MD   Subjective   No chest pain. Dyspnea is improved. Son present today  Inpatient Medications    Scheduled Meds:  amLODipine   5 mg Oral QHS   aspirin EC  81 mg Oral Daily   atorvastatin  40 mg Oral QHS   hydrALAZINE  25 mg Oral TID   insulin  aspart  0-9 Units Subcutaneous TID WC   insulin  aspart protamine- aspart  15 Units Subcutaneous BID WC   levothyroxine   100 mcg Oral Q0600   potassium chloride   20 mEq Oral BID   Continuous Infusions:  heparin  1,250 Units/hr (06/13/23 2159)   PRN Meds: acetaminophen , nitroGLYCERIN    Vital Signs    Vitals:   06/13/23 2036 06/13/23 2111 06/14/23 0011 06/14/23 0500  BP: (!) 166/65 (!) 166/55 (!) 112/54 128/64  Pulse:   71   Resp: 18  18 18   Temp: 99 F (37.2 C)  97.7 F (36.5 C) 98.1 F (36.7 C)  TempSrc: Oral  Oral Oral  SpO2:   97%   Weight:    84.8 kg  Height:        Intake/Output Summary (Last 24 hours) at 06/14/2023 0736 Last data filed at 06/14/2023 4098 Gross per 24 hour  Intake --  Output 2450 ml  Net -2450 ml   Filed Weights   06/12/23 2300 06/13/23 0017 06/14/23 0500  Weight: 86.2 kg 85.4 kg 84.8 kg    Telemetry    NSR, brief junctional rhythm - Personally Reviewed  ECG    none - Personally Reviewed  Physical Exam   GEN: No acute distress.   Neck: No JVD Cardiac: RRR, 2/6 harsh systolic murmurs, S2 is soft Respiratory: Clear to auscultation bilaterally. GI: Soft, nontender, non-distended  MS: No edema; No deformity. Neuro:  Nonfocal  Psych: Normal affect   Labs    Chemistry Recent Labs  Lab 06/09/23 1330 06/12/23 1856 06/13/23 0804  NA 129* 127* 131*  K 3.6 3.2* 3.4*  CL 94* 96* 98  CO2 17* 18* 19*  GLUCOSE 139* 141* 215*  BUN 41* 55* 50*  CREATININE 3.13* 3.20* 3.19*  CALCIUM 9.1 8.9 8.7*  GFRNONAA  --  14* 14*  ANIONGAP  --  13 14     Hematology Recent Labs   Lab 06/12/23 1856 06/13/23 0804 06/14/23 0437  WBC 9.3 6.7 6.2  RBC 3.33* 3.08* 2.91*  HGB 9.3* 8.6* 8.2*  HCT 29.3* 26.4* 25.0*  MCV 88.0 85.7 85.9  MCH 27.9 27.9 28.2  MCHC 31.7 32.6 32.8  RDW 13.7 13.7 13.8  PLT 434* 446* 415*    Cardiac EnzymesNo results for input(s): "TROPONINI" in the last 168 hours. No results for input(s): "TROPIPOC" in the last 168 hours.   BNP Recent Labs  Lab 06/12/23 1945  BNP 1,627.4*     DDimer No results for input(s): "DDIMER" in the last 168 hours.   Radiology    DG Chest 2 View Result Date: 06/12/2023 CLINICAL DATA:  Short of breath, dyspnea EXAM: CHEST - 2 VIEW COMPARISON:  01/03/2023, 06/08/2023 FINDINGS: Frontal and lateral views of the chest demonstrate an enlarged cardiac silhouette. There is increased pulmonary vascular congestion with bilateral ground-glass airspace disease. There are small bilateral pleural effusions. No evidence of pneumothorax. Postsurgical changes from prior thoracolumbar spinal fusion. IMPRESSION: 1. Constellation of findings most consistent with congestive heart failure. Electronically Signed   By: Bobbye Burrow  M.D.   On: 06/12/2023 20:14    Cardiac Studies   2D echo performed 2 months ago demonstrates mod AS and normal LV function  PET CT: Narrative & Impression      Findings are consistent with infarction with peri-infarct ischemia in virtually the entire LAD artery distribution. The study is high risk.   LV perfusion is abnormal. There is evidence of ischemia. There is evidence of infarction. Defect 1: There is a large defect with severe reduction in uptake present in the apical to mid anterior, anterolateral, anteroseptal and apex location(s) that is partially reversible. There is abnormal wall motion in the defect area. Consistent with infarction and peri-infarct ischemia. The defect is consistent with abnormal perfusion in the LAD territory.   Findings are surprising in a view of the recent normal LV  wall motion and overall LV systolic function on the echocardiogram from 04/08/2023, but may reflect an interval anteroapical infarction. Consider repeat echo.   Calculated LV EF is probably unreliable due to the marked R-R interval variability (frequent PACs). Rest left ventricular function is abnormal. There was a single regional abnormality. Rest EF: 33%. Stress left ventricular function is abnormal. There was a single regional abnormality. Stress EF: 15%. End diastolic cavity size is normal. End systolic cavity size is normal.   Myocardial blood flow was computed to be 0.4ml/g/min at rest and 0.21ml/g/min at stress. Global myocardial blood flow reserve was 0.74 and was highly abnormal.  MBFR is 0.74. The inverted myocardial blood flow reserve is a highly unusual finding, possibly triggered by diastolic hypotension in a patient with aortic stenosis -related cardiomyopathy.   Coronary calcium was present on the attenuation correction CT images. Severe coronary calcifications were present. Coronary calcifications were present in the left anterior descending artery and right coronary artery distribution(s).   Electronically signed by Luana Rumple, MD   CLINICAL DATA:  This over-read does not include interpretation of cardiac or coronary anatomy or pathology. The Cardiac PET CT interpretation by the cardiologist is attached.   COMPARISON:  None Available.    Patient Profile     78 y.o. female admitted with acute on chronic CHF in the setting of stage 5 renal insuff and probable tight AS and a h/o bradycardia  Assessment & Plan    Acute on chronic systolic/diastolic heart failure - her dyspnea is improved and weight down 3 lbs. I/O negative 4.8 liters since admission. Was ordered IV lasix  last night but apparently dose not received. Will give this am. BMET pending.  Will update Echo given low EF noted on PET but not on prior Echo.  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  She will likely soon need HD especially if cardiac cath is performed. AS - suspect severe low flow low gradient AS. Will repeat Echo.  Bradycardia - note plan for leadless PPM before. No bradycardia here. Need to determine overall plan before considering for pacemaker.  CAD. High risk PET CT. No chest pain.  Disposition. Patient expresses desire to be DNR. Will order. Son concerned about ability for her to continue living on her own. Will ask case management to get involved.   Will continue IV lasix  and IV heparin  for now. Will need Renal input. ? If patient a candidate for coronary revascularization and/or TAVR. Would need [facility] this admission if patient willing to accept risk of dialysis  For questions or updates, please contact [facility] Please consult [email or web address] for contact info under Cardiology/STEMI.  Signed, Amen Staszak [place], MD  06/14/2023, 7:36 AM

## 2023-06-14 NOTE — Progress Notes (Signed)
*  PRELIMINARY RESULTS* Echocardiogram 2D Echocardiogram has been performed.  Rhonda Clayton 06/14/2023, 3:47 PM

## 2023-06-14 NOTE — Plan of Care (Signed)
  Problem: Education: Goal: Knowledge of General Education information will improve Description: Including pain rating scale, medication(s)/side effects and non-pharmacologic comfort measures Outcome: Progressing   Problem: Health Behavior/Discharge Planning: Goal: Ability to manage health-related needs will improve Outcome: Progressing   Problem: Clinical Measurements: Goal: Ability to maintain clinical measurements within normal limits will improve Outcome: Progressing Goal: Will remain free from infection Outcome: Progressing Goal: Diagnostic test results will improve Outcome: Progressing Goal: Respiratory complications will improve Outcome: Progressing Goal: Cardiovascular complication will be avoided Outcome: Progressing   Problem: Activity: Goal: Risk for activity intolerance will decrease Outcome: Progressing   Problem: Nutrition: Goal: Adequate nutrition will be maintained Outcome: Progressing   Problem: Coping: Goal: Level of anxiety will decrease Outcome: Progressing   Problem: Elimination: Goal: Will not experience complications related to bowel motility Outcome: Progressing Goal: Will not experience complications related to urinary retention Outcome: Progressing   Problem: Pain Managment: Goal: General experience of comfort will improve and/or be controlled Outcome: Progressing   Problem: Safety: Goal: Ability to remain free from injury will improve Outcome: Progressing   Problem: Skin Integrity: Goal: Risk for impaired skin integrity will decrease Outcome: Progressing   Problem: Education: Goal: Understanding of cardiac disease, CV risk reduction, and recovery process will improve Outcome: Progressing Goal: Individualized Educational Video(s) Outcome: Progressing   Problem: Activity: Goal: Ability to tolerate increased activity will improve Outcome: Progressing   Problem: Cardiac: Goal: Ability to achieve and maintain adequate cardiovascular  perfusion will improve Outcome: Progressing   Problem: Health Behavior/Discharge Planning: Goal: Ability to safely manage health-related needs after discharge will improve Outcome: Progressing   Problem: Education: Goal: Ability to describe self-care measures that may prevent or decrease complications (Diabetes Survival Skills Education) will improve Outcome: Progressing Goal: Individualized Educational Video(s) Outcome: Progressing   Problem: Coping: Goal: Ability to adjust to condition or change in health will improve Outcome: Progressing   Problem: Fluid Volume: Goal: Ability to maintain a balanced intake and output will improve Outcome: Progressing   Problem: Health Behavior/Discharge Planning: Goal: Ability to identify and utilize available resources and services will improve Outcome: Progressing Goal: Ability to manage health-related needs will improve Outcome: Progressing   Problem: Metabolic: Goal: Ability to maintain appropriate glucose levels will improve Outcome: Progressing   Problem: Nutritional: Goal: Maintenance of adequate nutrition will improve Outcome: Progressing Goal: Progress toward achieving an optimal weight will improve Outcome: Progressing   Problem: Skin Integrity: Goal: Risk for impaired skin integrity will decrease Outcome: Progressing   Problem: Tissue Perfusion: Goal: Adequacy of tissue perfusion will improve Outcome: Progressing

## 2023-06-14 NOTE — Progress Notes (Signed)
 Mobility Specialist Progress Note;   06/14/23 1113  Mobility  Activity Ambulated with assistance in hallway  Level of Assistance Contact guard assist, steadying assist  Assistive Device Front wheel walker  Distance Ambulated (ft) 125 ft  Activity Response Tolerated well  Mobility Referral Yes  Mobility visit 1 Mobility  Mobility Specialist Start Time (ACUTE ONLY) 1113  Mobility Specialist Stop Time (ACUTE ONLY) 1129  Mobility Specialist Time Calculation (min) (ACUTE ONLY) 16 min   Pt agreeable to mobility. On 1LO2 upon arrival. Required MinG assistance during ambulation for safety. Ambulated on 1LO2, SPO2 95%> throughout. VC required fro RW proximity. Pt returned back to sitting on EoB with all needs met, call bell in reach. Family present.   Janit Meline Mobility Specialist Please contact via SecureChat or [employer] [phone]

## 2023-06-15 ENCOUNTER — Other Ambulatory Visit: Payer: Self-pay

## 2023-06-15 ENCOUNTER — Ambulatory Visit: Payer: PPO | Admitting: Physician Assistant

## 2023-06-15 ENCOUNTER — Other Ambulatory Visit (HOSPITAL_COMMUNITY): Payer: Self-pay

## 2023-06-15 DIAGNOSIS — I5023 Acute on chronic systolic (congestive) heart failure: Secondary | ICD-10-CM

## 2023-06-15 DIAGNOSIS — N186 End stage renal disease: Secondary | ICD-10-CM

## 2023-06-15 DIAGNOSIS — I350 Nonrheumatic aortic (valve) stenosis: Secondary | ICD-10-CM | POA: Diagnosis not present

## 2023-06-15 LAB — BASIC METABOLIC PANEL WITH GFR
Anion gap: 10 (ref 5–15)
BUN: 52 mg/dL — ABNORMAL HIGH (ref 8–23)
CO2: 21 mmol/L — ABNORMAL LOW (ref 22–32)
Calcium: 8.7 mg/dL — ABNORMAL LOW (ref 8.9–10.3)
Chloride: 100 mmol/L (ref 98–111)
Creatinine, Ser: 3.11 mg/dL — ABNORMAL HIGH (ref 0.44–1.00)
GFR, Estimated: 15 mL/min — ABNORMAL LOW (ref >60)
Glucose, Bld: 133 mg/dL — ABNORMAL HIGH (ref 70–99)
Potassium: 4.2 mmol/L (ref 3.5–5.1)
Sodium: 131 mmol/L — ABNORMAL LOW (ref 135–145)

## 2023-06-15 LAB — CBC
HCT: 25 % — ABNORMAL LOW (ref 36.0–46.0)
Hemoglobin: 8.1 g/dL — ABNORMAL LOW (ref 12.0–15.0)
MCH: 28 pg (ref 26.0–34.0)
MCHC: 32.4 g/dL (ref 30.0–36.0)
MCV: 86.5 fL (ref 80.0–100.0)
Platelets: 425 10*3/uL — ABNORMAL HIGH (ref 150–400)
RBC: 2.89 MIL/uL — ABNORMAL LOW (ref 3.87–5.11)
RDW: 14 % (ref 11.5–15.5)
WBC: 7 10*3/uL (ref 4.0–10.5)
nRBC: 0 % (ref 0.0–0.2)

## 2023-06-15 LAB — GLUCOSE, CAPILLARY
Glucose-Capillary: 111 mg/dL — ABNORMAL HIGH (ref 70–99)
Glucose-Capillary: 153 mg/dL — ABNORMAL HIGH (ref 70–99)
Glucose-Capillary: 155 mg/dL — ABNORMAL HIGH (ref 70–99)
Glucose-Capillary: 198 mg/dL — ABNORMAL HIGH (ref 70–99)

## 2023-06-15 LAB — HEPARIN LEVEL (UNFRACTIONATED): Heparin Unfractionated: 0.35 [IU]/mL (ref 0.30–0.70)

## 2023-06-15 MED ORDER — AMLODIPINE BESYLATE 5 MG PO TABS
5.0000 mg | ORAL_TABLET | Freq: Every day | ORAL | 1 refills | Status: DC
  Filled 2023-06-15: qty 90, 90d supply, fill #0

## 2023-06-15 MED ORDER — FUROSEMIDE 40 MG PO TABS
40.0000 mg | ORAL_TABLET | ORAL | 0 refills | Status: DC
  Filled 2023-06-15: qty 90, 90d supply, fill #0

## 2023-06-15 MED ORDER — ATORVASTATIN CALCIUM 40 MG PO TABS
40.0000 mg | ORAL_TABLET | Freq: Every day | ORAL | 1 refills | Status: DC
  Filled 2023-06-15: qty 90, 90d supply, fill #0

## 2023-06-15 MED ORDER — ASPIRIN 81 MG PO TBEC
81.0000 mg | DELAYED_RELEASE_TABLET | Freq: Every day | ORAL | 12 refills | Status: DC
  Filled 2023-06-15: qty 30, 30d supply, fill #0

## 2023-06-15 NOTE — Progress Notes (Signed)
 PHARMACY - ANTICOAGULATION CONSULT NOTE  Pharmacy Consult for heparin  Indication: chest pain/ACS  Allergies  Allergen Reactions   Coffee Flavoring Agent (Non-Screening) Other (See Comments)    Sick on stomach, sneezing, backed up   Azithromycin  Other (See Comments)   Fluogen [Influenza Virus Vaccine]    Molds & Smuts     Allergy test showed    Pneumococcal Vac Polyvalent Other (See Comments)    Large site reaction    Cephalexin  Other (See Comments)    Yeast infection   Ciprofloxacin Other (See Comments)    tendinitis   Oatmeal Other (See Comments)    Sneezing and drainage   Sulfa Antibiotics Other (See Comments)    Yeast infection    Patient Measurements: Height: 5\' 1"  (154.9 cm) Weight: 84.8 kg (186 lb 14.4 oz) IBW/kg (Calculated) : 47.8 HEPARIN  DW (KG): 67.4  Vital Signs: Temp: 98 F (36.7 C) (05/27 0800) Temp Source: Oral (05/27 0800) BP: 126/64 (05/27 0800) Pulse Rate: 65 (05/27 0800)  Labs: Recent Labs    06/12/23 1856 06/12/23 1945 06/12/23 2115 06/13/23 0804 06/13/23 1846 06/14/23 0437 06/14/23 0758 06/15/23 0419  HGB  --   --   --  8.6*  --  8.2*  --  8.1*  HCT  --   --   --  26.4*  --  25.0*  --  25.0*  PLT  --   --   --  446*  --  415*  --  425*  HEPARINUNFRC   < >  --   --  0.17* 0.30 0.31  --  0.35  CREATININE  --   --   --  3.19*  --   --  3.19* 3.11*  TROPONINIHS  --  579* 550*  --   --   --   --   --    < > = values in this interval not displayed.    Estimated Creatinine Clearance: 14.3 mL/min (A) (by C-G formula based on SCr of 3.11 mg/dL (H)).   Medical History: Past Medical History:  Diagnosis Date   Anemia    CKD (chronic kidney disease)    Cystocele    DJD (degenerative joint disease)    DM (diabetes mellitus) (HCC)    Esophageal stricture    Essential hypertension    Hyperlipidemia    Hypothyroidism    Lichen sclerosus    Osteopenia    RBBB with left anterior fascicular block    Rectocele    Stress incontinence    Torn  rotator cuff    Bilateral   Type 2 diabetes mellitus (HCC)    Venous insufficiency    Vitreous hemorrhage, left (HCC) 05/25/2019   The nature of the vitreous hemorrhage was discussed with the patient as well as the common causes.   Patients with diabetic eye disease may develop retinal neovascularization.  Other eye conditions develop retinal  neovascularization secondary to retinal venous occlusions.   Vitreous hemorrhage may result from spontaneous vitreous detachment or retinal breaks.  Blunt trauma is a common cause as wl      Assessment: 78 yo female reports after recent stress test has developed worsening dyspnea.  Pharmacy to dose heparin  for ASC/STEMI.  No prior AC noted.   Heparin  level remains therapeutic, CBC stable.  Goal of Therapy:  Heparin  level 0.3-0.7 units/ml Monitor platelets by anticoagulation protocol: Yes   Plan:  Continue heparin  1300 units/h Daily heparin  level and CBC  Levin Reamer, PharmD, BCPS, [facility] Clinical Pharmacist [phone] Please  check AMION for all [facility] Pharmacy numbers 06/15/2023

## 2023-06-15 NOTE — Plan of Care (Signed)
   Problem: Coping: Goal: Level of anxiety will decrease Outcome: Progressing

## 2023-06-15 NOTE — Progress Notes (Addendum)
 Patient Name: Rhonda Clayton Date of Encounter: 06/15/2023 [facility] Cardiologist: Arun K Thukkani, MD   Interval Summary  .    Patient seen at bedside with son in the room.  She expressed concerns of abdominal distention and discomfort that she believes is due to her constipation.  She otherwise endorse significant improvement in her breathing compared to when she first came in.  Patient had questions regarding stent versus pacemaker placement and how that would  affect her kidney function. All concerns adequately addressed.  She otherwise denies any chest pain, shortness of breath, dizziness or any vision changes.  Vital Signs .    Vitals:   06/14/23 2010 06/15/23 0011 06/15/23 0433 06/15/23 0800  BP: (!) 125/55 128/63 130/60 126/64  Pulse: 65 62 68 65  Resp: 18 18 18 18   Temp: 97.7 F (36.5 C) 97.7 F (36.5 C) 97.9 F (36.6 C) 98 F (36.7 C)  TempSrc: Oral Oral Oral Oral  SpO2: 98% 94% 99%   Weight:      Height:        Intake/Output Summary (Last 24 hours) at 06/15/2023 0814 Last data filed at 06/15/2023 0600 Gross per 24 hour  Intake 885.31 ml  Output 2500 ml  Net -1614.69 ml      06/14/2023    5:00 AM 06/13/2023   12:17 AM 06/12/2023   11:00 PM  Last 3 Weights  Weight (lbs) 186 lb 14.4 oz 188 lb 3.2 oz 190 lb  Weight (kg) 84.777 kg 85.367 kg 86.183 kg      Telemetry/ECG    Sinus rhythm,HR of 77  - Personally Reviewed  Physical Exam .   GEN: No acute distress.   Neck: No JVD Cardiac: RRR, harsh systolic murmur appreciated,  Respiratory: Mild crackles noted on lower lobes bilaterally  GI: Soft, nontender, non-distended  MS: Warm and dry,no LE edema  Assessment & Plan .     Patient is a 78 year old female with a history of diabetes mellitus, chronic kidney disease stage V and moderate aortic stenosis who initially presented to the ED for shortness of breath found to have pulmonary edema in the setting of  acute on chronic heart  failure.  #Acute on chronic systolic and diastolic heart failure - Dyspnea resolved  - Patient is euvolemic on exam and I agree with discontinuing Lasix , BMP reviewed - Left ventricular ejection fraction, by estimation, is 35 to 40%. The  left ventricle has moderately decreased function. The left ventricle demonstrates regional wall motion abnormalities severe hypokinesis of the mid-apical anterior wall, the mid-apical anterolateral wall, the apical septal wall, and the true apex. There is mild concentric left ventricular hypertrophy. Left ventricular diastolic parameters are consistent with Grade II diastolic dysfunction.  - Daily weights - PT/OT   #Severe Aortic stenosis #CAD - The aortic valve is tricuspid. There is severe calcifcation of the aortic valve. Aortic valve regurgitation is not visualized. Severe low flow/low gradient aortic valve stenosis. Aortic valve area, by VTI measures 0.89 cm. Aortic valve mean gradient measures 26.0 mmHg.   - Discussions regarding the need for coronary revascularization versus TAVR -  Follow up with Dr Lorie Rook on 6/23/205   #Chronic kidney disease stage V - Follow up with  Dr Irene Mannheim outpatient regarding AV fistula placement   For questions or updates, please contact [facility] Please consult [email or web address] for contact info under        Signed, Fay Hoop, MD    I have personally seen  and examined this patient. I agree with the assessment and plan as outlined above.  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 Admitted with acute CHF. (Systolic). She has diuresed well.  Planning as outpatient for TAVR but this has been delayed due to her CKD.  Also planning for pacemaker June 19th with h/o sick sinus syndrome.  Labs reviewed Tele with sinus My exam: systolic murmur. NAD. LUngs clear Ext: no LE edema Plan:  I have had a long discussion today with the patient and her son  about her cardiac issues. She will need workup for TAVR but this will be done as an outpatient following her next f/u with Nephrology (Dr. Irene Mannheim). She has seen Vascular surgery. It will be necessary to have her access for HD placed before her cardiac cath and pre TAVR CT scans.  She has diuresed well. Will plan Lasix  40 mg daily at home.  Keep appt for pacemaker placement  OK to d/c home today.   Antoinette Batman, MD, [facility] 06/15/2023 11:27 AM

## 2023-06-15 NOTE — Progress Notes (Signed)
 Heart Failure Navigator Progress Note  Assessed for Heart & Vascular TOC clinic readiness.  Patient does not meet criteria due to EF 35-40%, .CKD V - TAVR eval as outpatient with renal f/u, Severe aortic stenosis [facility] 6/23 , No HF TOC .    Navigator will sign off at this time.   Randie Bustle, BSN, Scientist, clinical (histocompatibility and immunogenetics) Only

## 2023-06-15 NOTE — Progress Notes (Signed)
 Mobility Specialist Progress Note;   06/15/23 1146  Mobility  Activity Ambulated with assistance in hallway  Level of Assistance Contact guard assist, steadying assist  Assistive Device Front wheel walker  Distance Ambulated (ft) 175 ft  Activity Response Tolerated well  Mobility Referral Yes  Mobility visit 1 Mobility  Mobility Specialist Start Time (ACUTE ONLY) 1146  Mobility Specialist Stop Time (ACUTE ONLY) 1200  Mobility Specialist Time Calculation (min) (ACUTE ONLY) 14 min   Pt agreeable to mobility. Required light MinG assistance during ambulation for safety. Ambulated on RA, VSS throughout. C/o not having a BM, otherwise asx. Pt returned back to sitting on EoB with all needs met. Son in room.   Janit Meline Mobility Specialist Please contact via SecureChat or [employer] [phone]

## 2023-06-15 NOTE — Discharge Summary (Addendum)
 Discharge Summary    [name] MRN: [id number]; DOB: December 28, 1945  Admit date: 06/12/2023 Discharge date: 06/15/2023  PCP:  Lonzie Robins, MD   [facility] Providers Cardiologist:  Kyra Phy, MD  Electrophysiologist:  Lei Pump, MD   Discharge Diagnoses    Principal Problem:   Acute congestive heart failure [facility]) Active Problems:   DM (diabetes mellitus) (HCC)   Essential hypertension   CKD (chronic kidney disease), stage IV (HCC)   CHF with left ventricular diastolic dysfunction, NYHA class 1 (HCC)   Acute on chronic systolic CHF (congestive heart failure), NYHA class 3 (HCC)   Severe aortic stenosis   Diagnostic Studies/Procedures    Echo: 06/14/2023  IMPRESSIONS     1. Left ventricular ejection fraction, by estimation, is 35 to 40%. The  left ventricle has moderately decreased function. The left ventricle  demonstrates regional wall motion abnormalities severe hypokinesis of the  mid-apical anterior wall, the  mid-apical anterolateral wall, the apical septal wall, and the true apex.  There is mild concentric left ventricular hypertrophy. Left ventricular  diastolic parameters are consistent with Grade II diastolic dysfunction  (pseudonormalization).   2. Right ventricular systolic function is normal. The right ventricular  size is mildly enlarged. There is moderately elevated pulmonary artery  systolic pressure. The estimated right ventricular systolic pressure is  51.6 mmHg.   3. Left atrial size was moderately dilated.   4. Right atrial size was mildly dilated.   5. The mitral valve is degenerative. Mild mitral valve regurgitation. No  evidence of mitral stenosis. Moderate mitral annular calcification.   6. The aortic valve is tricuspid. There is severe calcifcation of the  aortic valve. Aortic valve regurgitation is not visualized. Severe low  flow/low gradient aortic valve stenosis. Aortic valve area, by VTI   measures 0.89 cm. Aortic valve mean gradient   measures 26.0 mmHg.   7. The inferior vena cava is normal in size with <50% respiratory  variability, suggesting right atrial pressure of 8 mmHg.   FINDINGS   Left Ventricle: Left ventricular ejection fraction, by estimation, is 35  to 40%. The left ventricle has moderately decreased function. The left  ventricle demonstrates regional wall motion abnormalities. The left  ventricular internal cavity size was  normal in size. There is mild concentric left ventricular hypertrophy.  Left ventricular diastolic parameters are consistent with Grade II  diastolic dysfunction (pseudonormalization).   Right Ventricle: The right ventricular size is mildly enlarged. No  increase in right ventricular wall thickness. Right ventricular systolic  function is normal. There is moderately elevated pulmonary artery systolic  pressure. The tricuspid regurgitant  velocity is 3.30 m/s, and with an assumed right atrial pressure of 8 mmHg,  the estimated right ventricular systolic pressure is 51.6 mmHg.   Left Atrium: Left atrial size was moderately dilated.   Right Atrium: Right atrial size was mildly dilated.   Pericardium: There is no evidence of pericardial effusion.   Mitral Valve: The mitral valve is degenerative in appearance. There is  mild calcification of the mitral valve leaflet(s). Moderate mitral annular  calcification. Mild mitral valve regurgitation. No evidence of mitral  valve stenosis.   Tricuspid Valve: The tricuspid valve is normal in structure. Tricuspid  valve regurgitation is trivial.   Aortic Valve: The aortic valve is tricuspid. There is severe calcifcation  of the aortic valve. Aortic valve regurgitation is not visualized. Severe  aortic stenosis is present. Aortic valve mean gradient measures 26.0  mmHg.  Aortic valve peak gradient  measures 38.2 mmHg. Aortic valve area, by VTI measures 0.89 cm.   Pulmonic Valve: The  pulmonic valve was normal in structure. Pulmonic valve  regurgitation is not visualized.   Venous: The inferior vena cava is normal in size with less than 50%  respiratory variability, suggesting right atrial pressure of 8 mmHg.   IAS/Shunts: No atrial level shunt detected by color flow Doppler.  _____________   History of Present Illness     Rhonda Clayton is a 78 y.o. female with aortic stenosis, CKD V, diabetes mellitus who presented to the ED on 5/24 with complaints of shortness of breath. Initially she began having shortness of breath while completing her IADLs at home. A stress test was ordered at that time scheduled for Jun 08, 2023.  Since March she has continued to have worsening of her symptoms. In the last 2 weeks she developed substernal chest pain with rest. Her stress test showed signs of ischemia and infarction in the LAD territiry. Since her stress test she has had significant shortness of breath that has worsened with as the days progressed. The evening of admission she called the on-call cardiology pager to notify the on-call physician of her difficulty breathing. When on the phone it was noted that she had significant conversational dyspnea. She was recommended to present to the Emergency Department.      She presented to [facility] with a heart rate of 78, blood pressure 150/72, oxygen saturation 95% on room air.  Labs showed a creatinine of 3.2 with a GFR of 14. Initial hs-troponin of 579 followed by 550.  BNP of 1600.  Chest x-ray demonstrated bilateral interstitial infiltrates concerning for pulmonary edema.  She was given 20 of IV Lasix  with minimal urine output. Transferred to [facility] for further management.     Hospital Course   Acute on chronic combined heart failure -- presented with worsening dyspnea. BNP 1627, CXR with bilateral pulmonary edema -- diuresed with IV lasix , net - 6L, weight down 186lbs  -- echo 5/26 with LVEF of 35-40%, hypokinesis of the mid-apical,  anterior, anterolateral and apical wall, g2DD, moderately dilated LA -- plan for lasix  40mg  daily at discharge   CKD Stage 4 -- baseline Cr 3.1-3.3, stable at 3.1 at the time of discharge -- follow up with nephrology outpatient, needs to further discuss plans for possible HD cath with need for TAVR scans  Aortic stenosis -- echo 5/26 with severe low flow low gradient aortic stenosis, AVA 0.89, mean gradient -- has follow up arranged outpatient for ongoing TAVR discussions with Dr. Lorie Rook  Bradycardia  -- following with EP, Dr. Arlester Ladd with plans for leadless PPM on 6/19 -- No bradycardia while inpatient  CAD -- recent outpatient high risk PET CT in the LAD territory. Denies any chest pain -- on ASA, statin  Hypertension -- continue norvasc  5mg  daily, hydralazine 25mg  TID  DM -- resume home insulin  regimen  Patient was seen by Dr. Abel Hoe and deemed stable for discharge home. Follow up arranged in the office. Medications sent to the [facility] pharmacy prior to discharge.   Did the patient have an acute coronary syndrome (MI, NSTEMI, STEMI, etc) this admission?:  No                               Did the patient have a percutaneous coronary intervention (stent / angioplasty)?:  No.  _____________  Discharge Vitals Blood pressure 126/64, pulse 65, temperature 98 F (36.7 C), temperature source Oral, resp. rate 18, height 5\' 1"  (1.549 m), weight 84.8 kg, SpO2 99%.  Filed Weights   06/12/23 2300 06/13/23 0017 06/14/23 0500  Weight: 86.2 kg 85.4 kg 84.8 kg    Labs & Radiologic Studies    CBC Recent Labs    06/14/23 0437 06/15/23 0419  WBC 6.2 7.0  HGB 8.2* 8.1*  HCT 25.0* 25.0*  MCV 85.9 86.5  PLT 415* 425*   Basic Metabolic Panel Recent Labs    09/81/19 0758 06/15/23 0419  NA 130* 131*  K 3.7 4.2  CL 100 100  CO2 21* 21*  GLUCOSE 193* 133*  BUN 51* 52*  CREATININE 3.19* 3.11*  CALCIUM 8.7* 8.7*   Liver Function Tests No results for input(s): "AST",  "ALT", "ALKPHOS", "BILITOT", "PROT", "ALBUMIN" in the last 72 hours. No results for input(s): "LIPASE", "AMYLASE" in the last 72 hours. High Sensitivity Troponin:   Recent Labs  Lab 06/12/23 1945 06/12/23 2115  TROPONINIHS 579* 550*    BNP Invalid input(s): "POCBNP" D-Dimer No results for input(s): "DDIMER" in the last 72 hours. Hemoglobin A1C No results for input(s): "HGBA1C" in the last 72 hours. Fasting Lipid Panel Recent Labs    06/13/23 0804  CHOL 154  HDL 35*  LDLCALC 99  TRIG 99  CHOLHDL 4.4   Thyroid  Function Tests No results for input(s): "TSH", "T4TOTAL", "T3FREE", "THYROIDAB" in the last 72 hours.  Invalid input(s): "FREET3" _____________  ECHOCARDIOGRAM COMPLETE Result Date: 06/14/2023    ECHOCARDIOGRAM REPORT   Patient Name:   Rhonda Clayton Date of Exam: 06/14/2023 Medical Rec #:  [id number]       Height:       61.0 in Accession #:    [id number]      Weight:       186.9 lb Date of Birth:  01/18/1946       BSA:          1.835 m Patient Age:    78 years        BP:           105/62 mmHg Patient Gender: F               HR:           68 bpm. Exam Location:  Inpatient Procedure: 2D Echo, 3D Echo, Cardiac Doppler and Color Doppler (Both Spectral            and Color Flow Doppler were utilized during procedure). Indications:    Aortic Stenosis l35.0  History:        Patient has prior history of Echocardiogram examinations, most                 recent 04/08/2023. CHF, NSTEMI, Aortic Valve Disease,                 Arrythmias:RBBB; Risk Factors:Hypertension, Diabetes and                 Dyslipidemia.  Sonographer:    Denese Finn RCS Referring Phys: [phone] PETER M [place] IMPRESSIONS  1. Left ventricular ejection fraction, by estimation, is 35 to 40%. The left ventricle has moderately decreased function. The left ventricle demonstrates regional wall motion abnormalities severe hypokinesis of the mid-apical anterior wall, the mid-apical anterolateral wall, the apical septal wall, and the  true apex. There is mild concentric left ventricular hypertrophy. Left ventricular diastolic parameters are  consistent with Grade II diastolic dysfunction (pseudonormalization).  2. Right ventricular systolic function is normal. The right ventricular size is mildly enlarged. There is moderately elevated pulmonary artery systolic pressure. The estimated right ventricular systolic pressure is 51.6 mmHg.  3. Left atrial size was moderately dilated.  4. Right atrial size was mildly dilated.  5. The mitral valve is degenerative. Mild mitral valve regurgitation. No evidence of mitral stenosis. Moderate mitral annular calcification.  6. The aortic valve is tricuspid. There is severe calcifcation of the aortic valve. Aortic valve regurgitation is not visualized. Severe low flow/low gradient aortic valve stenosis. Aortic valve area, by VTI measures 0.89 cm. Aortic valve mean gradient  measures 26.0 mmHg.  7. The inferior vena cava is normal in size with <50% respiratory variability, suggesting right atrial pressure of 8 mmHg. FINDINGS  Left Ventricle: Left ventricular ejection fraction, by estimation, is 35 to 40%. The left ventricle has moderately decreased function. The left ventricle demonstrates regional wall motion abnormalities. The left ventricular internal cavity size was normal in size. There is mild concentric left ventricular hypertrophy. Left ventricular diastolic parameters are consistent with Grade II diastolic dysfunction (pseudonormalization). Right Ventricle: The right ventricular size is mildly enlarged. No increase in right ventricular wall thickness. Right ventricular systolic function is normal. There is moderately elevated pulmonary artery systolic pressure. The tricuspid regurgitant velocity is 3.30 m/s, and with an assumed right atrial pressure of 8 mmHg, the estimated right ventricular systolic pressure is 51.6 mmHg. Left Atrium: Left atrial size was moderately dilated. Right Atrium: Right atrial  size was mildly dilated. Pericardium: There is no evidence of pericardial effusion. Mitral Valve: The mitral valve is degenerative in appearance. There is mild calcification of the mitral valve leaflet(s). Moderate mitral annular calcification. Mild mitral valve regurgitation. No evidence of mitral valve stenosis. Tricuspid Valve: The tricuspid valve is normal in structure. Tricuspid valve regurgitation is trivial. Aortic Valve: The aortic valve is tricuspid. There is severe calcifcation of the aortic valve. Aortic valve regurgitation is not visualized. Severe aortic stenosis is present. Aortic valve mean gradient measures 26.0 mmHg. Aortic valve peak gradient measures 38.2 mmHg. Aortic valve area, by VTI measures 0.89 cm. Pulmonic Valve: The pulmonic valve was normal in structure. Pulmonic valve regurgitation is not visualized. Venous: The inferior vena cava is normal in size with less than 50% respiratory variability, suggesting right atrial pressure of 8 mmHg. IAS/Shunts: No atrial level shunt detected by color flow Doppler.  LEFT VENTRICLE PLAX 2D LVIDd:         5.30 cm   Diastology LVIDs:         3.60 cm   LV e' medial:    4.46 cm/s LV PW:         1.10 cm   LV E/e' medial:  30.9 LV IVS:        1.20 cm   LV e' lateral:   6.31 cm/s LVOT diam:     2.00 cm   LV E/e' lateral: 21.9 LV SV:         69 LV SV Index:   38 LVOT Area:     3.14 cm                           3D Volume EF:                          3D EF:  56 %                          LV EDV:       155 ml                          LV ESV:       69 ml                          LV SV:        86 ml RIGHT VENTRICLE RV S prime:     11.50 cm/s TAPSE (M-mode): 2.6 cm LEFT ATRIUM             Index        RIGHT ATRIUM           Index LA diam:        4.00 cm 2.18 cm/m   RA Area:     18.50 cm LA Vol (A2C):   90.8 ml 49.48 ml/m  RA Volume:   58.60 ml  31.93 ml/m LA Vol (A4C):   80.7 ml 43.97 ml/m LA Biplane Vol: 85.5 ml 46.59 ml/m  AORTIC VALVE AV Area (Vmax):     0.85 cm AV Area (Vmean):   0.81 cm AV Area (VTI):     0.89 cm AV Vmax:           309.00 cm/s AV Vmean:          226.667 cm/s AV VTI:            0.777 m AV Peak Grad:      38.2 mmHg AV Mean Grad:      26.0 mmHg LVOT Vmax:         83.50 cm/s LVOT Vmean:        58.300 cm/s LVOT VTI:          0.221 m LVOT/AV VTI ratio: 0.28  AORTA Ao Root diam: 3.40 cm MITRAL VALVE                TRICUSPID VALVE MV Area (PHT): 4.60 cm     TR Peak grad:   43.6 mmHg MV Decel Time: 165 msec     TR Vmax:        330.00 cm/s MV E velocity: 138.00 cm/s MV A velocity: 83.30 cm/s   SHUNTS MV E/A ratio:  1.66         Systemic VTI:  0.22 m                             Systemic Diam: 2.00 cm Dalton McleanMD Electronically signed by Archer Bear Signature Date/Time: 06/14/2023/3:58:40 PM    Final    DG Chest 2 View Result Date: 06/12/2023 CLINICAL DATA:  Short of breath, dyspnea EXAM: CHEST - 2 VIEW COMPARISON:  01/03/2023, 06/08/2023 FINDINGS: Frontal and lateral views of the chest demonstrate an enlarged cardiac silhouette. There is increased pulmonary vascular congestion with bilateral ground-glass airspace disease. There are small bilateral pleural effusions. No evidence of pneumothorax. Postsurgical changes from prior thoracolumbar spinal fusion. IMPRESSION: 1. Constellation of findings most consistent with congestive heart failure. Electronically Signed   By: Bobbye Burrow M.D.   On: 06/12/2023 20:14   NM PET CT CARDIAC PERFUSION MULTI W/ABSOLUTE BLOODFLOW Result Date: 06/09/2023   Findings are consistent with infarction with peri-infarct  ischemia in virtually the entire LAD artery distribution. The study is high risk.   LV perfusion is abnormal. There is evidence of ischemia. There is evidence of infarction. Defect 1: There is a large defect with severe reduction in uptake present in the apical to mid anterior, anterolateral, anteroseptal and apex location(s) that is partially reversible. There is abnormal wall motion in the  defect area. Consistent with infarction and peri-infarct ischemia. The defect is consistent with abnormal perfusion in the LAD territory.   Findings are surprising in a view of the recent normal LV wall motion and overall LV systolic function on the echocardiogram from 04/08/2023, but may reflect an interval anteroapical infarction. Consider repeat echo.   Calculated LV EF is probably unreliable due to the marked R-R interval variability (frequent PACs). Rest left ventricular function is abnormal. There was a single regional abnormality. Rest EF: 33%. Stress left ventricular function is abnormal. There was a single regional abnormality. Stress EF: 15%. End diastolic cavity size is normal. End systolic cavity size is normal.   Myocardial blood flow was computed to be 0.64ml/g/min at rest and 0.49ml/g/min at stress. Global myocardial blood flow reserve was 0.74 and was highly abnormal.  MBFR is 0.74. The inverted myocardial blood flow reserve is a highly unusual finding, possibly triggered by diastolic hypotension in a patient with aortic stenosis -related cardiomyopathy.   Coronary calcium was present on the attenuation correction CT images. Severe coronary calcifications were present. Coronary calcifications were present in the left anterior descending artery and right coronary artery distribution(s).   Electronically signed by Luana Rumple, MD CLINICAL DATA:  This over-read does not include interpretation of cardiac or coronary anatomy or pathology. The Cardiac PET CT interpretation by the cardiologist is attached. COMPARISON:  None Available. FINDINGS: Cardiovascular: Aortic valve calcifications. Aortic atherosclerosis. Normal heart size. Three-vessel coronary artery calcifications. No pericardial effusion. Limited Mediastinum/Nodes: No enlarged mediastinal, hilar, or axillary lymph nodes. Trachea and esophagus demonstrate no significant findings. Limited Lungs/Pleura: Lungs are clear. No pleural effusion or  pneumothorax. Upper Abdomen: No acute abnormality. Musculoskeletal: No chest wall abnormality. No acute osseous findings. IMPRESSION: 1. No acute CT findings of the included chest. 2. Coronary artery disease. 3. Aortic valve calcifications. Correlate for echocardiographic evidence of aortic valve dysfunction. Aortic Atherosclerosis (ICD10-I70.0). Electronically Signed   By: Fredricka Jenny M.D.   On: 06/08/2023 16:15  Disposition   Pt is being discharged home today in good condition.  Follow-up Plans & Appointments     Follow-up Information     Nicolas Barren, MD Follow up.   Specialty: Nephrology Why: Follow up with nephrology in 2 weeks Contact information: [address] [place] [address] [phone]                Discharge Instructions     Diet - low sodium heart healthy   Complete by: As directed    Increase activity slowly   Complete by: As directed         Discharge Medications   Allergies as of 06/15/2023       Reactions   Coffee Flavoring Agent (non-screening) Other (See Comments)   Sick on stomach, sneezing, backed up   Azithromycin  Other (See Comments)   Fluogen [influenza Virus Vaccine]    Molds & Smuts    Allergy test showed    Pneumococcal Vac Polyvalent Other (See Comments)   Large site reaction    Cephalexin  Other (See Comments)   Yeast infection   Ciprofloxacin Other (See Comments)  tendinitis   Oatmeal Other (See Comments)   Sneezing and drainage   Sulfa Antibiotics Other (See Comments)   Yeast infection        Medication List     TAKE these medications    amLODipine  5 MG tablet Commonly known as: NORVASC  Take 1 tablet (5 mg total) by mouth at bedtime. What changed:  medication strength how much to take   aspirin EC 81 MG tablet Take 1 tablet (81 mg total) by mouth daily. Swallow whole. Start taking on: Jun 16, 2023   atorvastatin 40 MG tablet Commonly known as: LIPITOR Take 1 tablet (40 mg total) by mouth at  bedtime.   B-D UF III MINI PEN NEEDLES 31G X 5 MM Misc Generic drug: Insulin  Pen Needle Inject into the skin 2 (two) times daily.   betamethasone valerate 0.1 % cream Commonly known as: VALISONE Apply 1 Application topically as needed (skin condition).   FreeStyle Libre 3 Sensor Misc change every 14 days topically to monitor blood glucose continuously 28 days   furosemide  40 MG tablet Commonly known as: LASIX  Take 1 tablet (40 mg total) by mouth See admin instructions. What changed:  how much to take additional instructions   hydrALAZINE 25 MG tablet Commonly known as: APRESOLINE Take 25 mg by mouth 3 (three) times daily.   nitroGLYCERIN  0.4 MG SL tablet Commonly known as: NITROSTAT  Place 1 tablet (0.4 mg total) under the tongue every 5 (five) minutes as needed for chest pain.   NovoLOG  Mix 70/30 FlexPen (70-30) 100 UNIT/ML FlexPen Generic drug: insulin  aspart protamine - aspart Inject into the skin in the morning and at bedtime. Takes 15 units in the morning and 9-10 units at bedtime   OneTouch Delica Plus Lancet33G Misc Apply topically 3 (three) times daily.   OneTouch Verio test strip Generic drug: glucose blood 3 (three) times daily.   pantoprazole  40 MG tablet Commonly known as: PROTONIX  Take 1.5 tablets by mouth daily.   Synthroid  100 MCG tablet Generic drug: levothyroxine  Take 100 mcg by mouth See admin instructions. Takes 1 tablet daily except for Monday and Friday she takes 0.5 tablet          Outstanding Labs/Studies   N/a  Duration of Discharge Encounter: APP Time: 20 minutes   Signed, Johnie Nailer, NP 06/15/2023, 2:20 PM   I have personally seen and examined this patient. I agree with the assessment and plan as outlined above.  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 Admitted with acute CHF. (Systolic). She has diuresed well.  Planning as outpatient for TAVR but this has been  delayed due to her CKD.  Also planning for pacemaker June 19th with h/o sick sinus syndrome.  Labs reviewed Tele with sinus My exam: systolic murmur. NAD. LUngs clear Ext: no LE edema Plan:  I have had a long discussion today with the patient and her son about her cardiac issues. She will need workup for TAVR but this will be done as an outpatient following her next f/u with Nephrology (Dr. Irene Mannheim). She has seen Vascular surgery. It will be necessary to have her access for HD placed before her cardiac cath and pre TAVR CT scans.  She has diuresed well. Will plan Lasix  40 mg daily at home.  Keep appt for pacemaker placement  OK to d/c home today.   The above documentation is my original documentation from today. 25 minutes spent on chart review, lab review,  tele review, echo image review, planning for d/c, examination, discussion with pt and family and documentation.   Antoinette Batman, MD, [facility] 06/15/2023 2:27 PM

## 2023-06-15 NOTE — TOC Initial Note (Signed)
 Transition of Care [facility]) - Initial/Assessment Note    Patient Details  Name: Rhonda Clayton MRN: [id number] Date of Birth: April 19, 1945  [facility]) CM/SW Contact:    Cosimo Diones, RN Phone Number: 06/15/2023, 2:54 PM  Clinical Narrative:  Patient presented for shortness of breath. PTA patient was from home alone. Patient has support of son and he is at the bedside. Patient and son want a rolling walker- no agency preference-referral submitted to [employer]. DME will be delivered to the room. Patient in need of HH PT/Aide and patient does not have an agency preference. Referral submitted to [facility] and start of care to begin within 24-48 hours post transition home. Son will assist with transportation home. No further needs identified at this time.      Expected Discharge Plan: Home w Home Health Services Barriers to Discharge: No Barriers Identified   Patient Goals and CMS Choice Patient states their goals for this hospitalization and ongoing recovery are:: patient will return home   Choice offered to / list presented to : Patient (no agency preference.)      Expected Discharge Plan and Services     Post Acute Care Choice: Home Health Living arrangements for the past 2 months: Single Family Home Expected Discharge Date: 06/15/23               DME Arranged: Otho Blitz rolling DME Agency: [facility] Date DME Agency Contacted: 06/15/23 Time DME Agency Contacted: 1451 Representative spoke with at DME Agency: Marlou Sims HH Arranged: PT, OT, Nurse's Aide HH Agency: [facility] Date [facility] Agency Contacted: 06/15/23 Time HH Agency Contacted: 1453 Representative spoke with at [facility] Agency: Randel Buss  Prior Living Arrangements/Services Living arrangements for the past 2 months: Single Family Home Lives with:: Self (has support of son.) Patient language and need for interpreter reviewed:: Yes Do you feel safe going back to the place where you live?: Yes       Need for Family Participation in Patient Care: No (Comment) Care giver support system in place?: Yes (comment) Current home services: DME Criminal Activity/Legal Involvement Pertinent to Current Situation/Hospitalization: No - Comment as needed  Activities of Daily Living   ADL Screening (condition at time of admission) Independently performs ADLs?: Yes (appropriate for developmental age) Is the patient deaf or have difficulty hearing?: No Does the patient have difficulty seeing, even when wearing glasses/contacts?: No Does the patient have difficulty concentrating, remembering, or making decisions?: No  Permission Sought/Granted Permission sought to share information with : Family Supports, Magazine features editor, Case Estate manager/land agent granted to share information with : Yes, Verbal Permission Granted     Permission granted to share info w AGENCY: [facility]        Emotional Assessment Appearance:: Appears stated age Attitude/Demeanor/Rapport: Engaged Affect (typically observed): Appropriate Orientation: : Oriented to Self, Oriented to Situation, Oriented to  Time, Oriented to Place Alcohol  / Substance Use: Not Applicable Psych Involvement: No (comment)  Admission diagnosis:  Acute congestive heart failure (HCC) [I50.9] Acute diastolic congestive heart failure (HCC) [I50.31] NSTEMI (non-ST elevated myocardial infarction) [facility]) [I21.4] Patient Active Problem List   Diagnosis Date Noted   Acute on chronic systolic CHF (congestive heart failure), NYHA class 3 (HCC) 06/15/2023   Severe aortic stenosis 06/15/2023   NSTEMI (non-ST elevated myocardial infarction) (HCC) 06/13/2023   Acute congestive heart failure (HCC) 06/12/2023   Hyperlipidemia    Acute respiratory failure with hypoxia (HCC) 01/05/2023   Multifocal pneumonia 01/03/2023   Prolonged QT  interval 01/03/2023   CKD (chronic kidney disease), stage IV (HCC) 01/03/2023   Anemia 01/03/2023   CHF with left  ventricular diastolic dysfunction, NYHA class 1 (HCC) 01/03/2023   Macular pucker, 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 determined by examination associated with type 2 diabetes mellitus (HCC) 05/25/2019   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 [place] [address] Phone: [phone] Fax: [phone]  Melodee Spruce [facility] - [facility] [address] [place] [address] Phone: [phone] Fax: [phone]  [facility] [address] [place] [address] Phone: [phone] Fax: [phone]     Social Drivers of Health (SDOH) Social History: SDOH Screenings   Food Insecurity: No Food Insecurity (06/13/2023)  Housing: Low Risk  (06/13/2023)  Transportation Needs: No Transportation Needs (06/13/2023)  Utilities: Not At Risk (06/13/2023)  Depression (PHQ2-9): Low Risk  (06/10/2023)  Social Connections: Unknown (06/13/2023)  Tobacco Use: Low Risk  (06/12/2023)   SDOH Interventions:     Readmission Risk Interventions     No data to display

## 2023-06-16 ENCOUNTER — Ambulatory Visit (HOSPITAL_COMMUNITY): Admit: 2023-06-16 | Admitting: Cardiology

## 2023-06-16 ENCOUNTER — Encounter (HOSPITAL_COMMUNITY): Payer: Self-pay

## 2023-06-16 ENCOUNTER — Other Ambulatory Visit: Payer: Self-pay

## 2023-06-16 ENCOUNTER — Encounter (HOSPITAL_COMMUNITY): Payer: Self-pay | Admitting: Vascular Surgery

## 2023-06-16 ENCOUNTER — Other Ambulatory Visit (HOSPITAL_COMMUNITY): Payer: Self-pay

## 2023-06-16 SURGERY — PACEMAKER IMPLANT

## 2023-06-16 NOTE — Progress Notes (Signed)
 Anesthesia Chart Review: Same-day workup  78 year old female follows with cardiology for history of severe aortic stenosis, trifascicular block, sick sinus syndrome awaiting pacemaker placement, HTN, combined heart failure.  Initially seen by cardiologist Dr. Lorie Rook on 03/17/2023 as a referral from nephrology for evaluation of bradycardia.  At that visit, patient also reported exertional chest pain.  Dr. Lorie Rook ordered event monitor, echo, cardiac PET/CT.  Monitor showed sinus bradycardia and occasional pauses.  Echo showed EF 55 to 60% with moderate aortic stenosis. Cardiac PET/CT 06/08/2023 was high risk showing findings consistent with infarction and peri-infarct ischemia in virtually the entire LAD artery distribution.  Dr. Lorie Rook commented on results stating, "Let her know that her stress test was quite abnormal and suggest that she may have a blockage in the heart.  I would like to get an echocardiogram since her ejection fraction on the PET stress test is much lower than on her echocardiogram done in March.  Please have her see me after the echocardiogram to discuss further.  Her very poor kidney function will make cardiac catheterization more risky."  She was seen by EP cardiologist Dr. Arlester Ladd on 06/09/2023 for discussion of leadless pacemaker placement. Per note, "With risk for complete AV block given her significant AV conduction disease and monitor findings of paroxysmal sinus pauses, a Medtronic Micra AV may be the best option to give her ventricular backup pacing. If, in the future she develops sinus bradycardia, an atrial leadless pacemaker may be placed. --I will discuss further with her primary electrophysiologist Dr. Lawana Pray but at this point, we will go ahead and reserve her a spot for the procedure. Would employ general anesthesia for leadless device placement.Aaron AasAaron AasStage IV CKD: Followed by nephrology. With plans in process for obtaining dialysis access. Of some concern is that placement of a  leadless pacemaker would require contrast injection, and the amount is not always trivial with the dual-chamber leadless system."  She was subsequently admitted 5/24 through 06/15/2023 after presenting to the ED with complaints of shortness of breath.  BNP 1627, CXR with bilateral pulmonary edema.  She was diuresed 6 L. Echo 5/26 with LVEF of 35-40%, hypokinesis of the mid-apical, anterior, anterolateral and apical wall, g2DD, moderately dilated LA,. severe low flow low gradient aortic stenosis, AVA 0.89, mean gradient .  She was discharged on Lasix  40 mg daily.  Outpatient follow-up was arranged for ongoing TAVR discussion with Dr. Lorie Rook.  She was not noted to have any bradycardia while inpatient.  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  GERD on PPI.  BMP and CBC 06/15/23 reviewed, mild hyponatremia sodium 131, creatinine elevated 3.11 consistent with CKD, anemia with hemoglobin 8.1.  History discussed with anesthesiologist Dr. Glenetta Lane.  Given significant cardiovascular comorbidities, he recommended proceeding with [facility] placement rather than fistula placement at this time.  I have relayed this to Dr. [name].  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  Rate 69. 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 Left ventricular hypertrophy with repolarization abnormality  TTE 06/14/2023: 1. Left ventricular ejection fraction, by estimation, is 35 to 40%. The  left ventricle has moderately decreased function. The left ventricle  demonstrates regional wall motion abnormalities severe hypokinesis of the  mid-apical anterior wall, the  mid-apical anterolateral wall, the apical septal wall, and the true apex.  There is mild concentric left ventricular hypertrophy. Left ventricular  diastolic parameters are consistent with Grade II diastolic dysfunction  (pseudonormalization).   2. Right  ventricular systolic function is normal. The right  ventricular  size is mildly enlarged. There is moderately elevated pulmonary artery  systolic pressure. The estimated right ventricular systolic pressure is  51.6 mmHg.   3. Left atrial size was moderately dilated.   4. Right atrial size was mildly dilated.   5. The mitral valve is degenerative. Mild mitral valve regurgitation. No  evidence of mitral stenosis. Moderate mitral annular calcification.   6. The aortic valve is tricuspid. There is severe calcifcation of the  aortic valve. Aortic valve regurgitation is not visualized. Severe low  flow/low gradient aortic valve stenosis. Aortic valve area, by VTI  measures 0.89 cm. Aortic valve mean gradient   measures 26.0 mmHg.   7. The inferior vena cava is normal in size with <50% respiratory  variability, suggesting right atrial pressure of 8 mmHg.    Cardiac PET/CT 06/08/2023:   Findings are consistent with infarction with peri-infarct ischemia in virtually the entire LAD artery distribution. The study is high risk.   LV perfusion is abnormal. There is evidence of ischemia. There is evidence of infarction. Defect 1: There is a large defect with severe reduction in uptake present in the apical to mid anterior, anterolateral, anteroseptal and apex location(s) that is partially reversible. There is abnormal wall motion in the defect area. Consistent with infarction and peri-infarct ischemia. The defect is consistent with abnormal perfusion in the LAD territory.   Findings are surprising in a view of the recent normal LV wall motion and overall LV systolic function on the echocardiogram from 04/08/2023, but may reflect an interval anteroapical infarction. Consider repeat echo.   Calculated LV EF is probably unreliable due to the marked R-R interval variability (frequent PACs). Rest left ventricular function is abnormal. There was a single regional abnormality. Rest EF: 33%. Stress left ventricular function is abnormal. There was a single regional  abnormality. Stress EF: 15%. End diastolic cavity size is normal. End systolic cavity size is normal.   Myocardial blood flow was computed to be 0.54ml/g/min at rest and 0.28ml/g/min at stress. Global myocardial blood flow reserve was 0.74 and was highly abnormal.  MBFR is 0.74. The inverted myocardial blood flow reserve is a highly unusual finding, possibly triggered by diastolic hypotension in a patient with aortic stenosis -related cardiomyopathy.   Coronary calcium was present on the attenuation correction CT images. Severe coronary calcifications were present. Coronary calcifications were present in the left anterior descending artery and right coronary artery distribution(s).   Electronically signed by Luana Rumple, MD   TTE 04/08/2023: 1. Left ventricular ejection fraction, by estimation, is 55 to 60%. Left  ventricular ejection fraction by 3D volume is 62 %. The left ventricle has  normal function. The left ventricle has no regional wall motion  abnormalities. There is mild left  ventricular hypertrophy. Left ventricular diastolic parameters are  indeterminate. The average left ventricular global longitudinal strain is  -19.8 %. The global longitudinal strain is normal.   2. Right ventricular systolic function is normal. The right ventricular  size is normal. There is normal pulmonary artery systolic pressure. The  estimated right ventricular systolic pressure is 24.3 mmHg.   3. Left atrial size was mildly dilated.   4. The mitral valve is degenerative. Mild mitral valve regurgitation. No  evidence of mitral stenosis. Severe mitral annular calcification.   5. The aortic valve is abnormal. There is severe calcifcation of the  aortic valve. Aortic valve regurgitation is not visualized. Moderate  aortic valve stenosis. Aortic  valve area, by VTI measures 1.10 cm. Aortic  valve mean gradient measures 21.0 mmHg.  Aortic valve Vmax measures 3.16 m/s.   6. The inferior vena cava is normal  in size with greater than 50%  respiratory variability, suggesting right atrial pressure of 3 mmHg.     Edilia Gordon [facility] Short Stay Center/Anesthesiology Phone [phone] 06/16/2023 4:25 PM

## 2023-06-16 NOTE — Progress Notes (Signed)
 SDW call  Patient was given pre-op  instructions over the phone. Patient verbalized understanding of instructions provided.     PCP - Dr. Lonzie Robins Cardiologist - Dr. Alyssa Backbone EP Cardiologist: Dr. Agatha Horsfall Pulmonary:    PPM/ICD - denies, scheduled 07/08/2023 Device Orders -  Rep Notified -    Chest x-ray - 06/12/2023 EKG -  06/14/2023 Stress Test - 06/08/2023 ECHO - 06/14/2023 Cardiac Cath -   Sleep Study/sleep apnea/CPAP: denies  Type II diabetic: CGM, Freestyle Libre to right arm Fasting Blood sugar range: 110-135 How often check sugars: continuous Novolog  70/30: 6-7  units hs before surgery which is 70% of regular dose: zero units day of surgery   Blood Thinner Instructions: denies Aspirin Instructions:states she was instructed to hold day of surgery   ERAS Protcol - NPO   Anesthesia review: Yes.  RBBB, ESRD, HTN, DM, HLD, CHF   Patient denies shortness of breath, fever, cough and chest pain over the phone call  Your procedure is scheduled on Thursday Jun 17, 2023  Report to [facility] Main Entrance "A" at  0915  A.M., then check in with the Admitting office.  Call this number if you have problems the morning of surgery:  [phone]   If you have any questions prior to your surgery date call [phone]: Open Monday-Friday 8am-4pm If you experience any cold or flu symptoms such as cough, fever, chills, shortness of breath, etc. between now and your scheduled surgery, please notify us  at the above number    Remember:  Do not eat or drink after midnight the night before your surgery Take these medicines the morning of surgery with A SIP OF WATER :  Hydralazine, levothyroxine , protonix   As of today, STOP taking any Aleve, Naproxen, Ibuprofen, Motrin, Advil, Goody's, BC's, all herbal medications, fish oil, and all vitamins.

## 2023-06-16 NOTE — Anesthesia Preprocedure Evaluation (Signed)
 Anesthesia Evaluation  Patient identified by MRN, date of birth, ID band Patient awake    Reviewed: Allergy & Precautions, NPO status , Patient's Chart, lab work & pertinent test results  Airway Mallampati: II  TM Distance: >3 FB Neck ROM: Full    Dental   Pulmonary shortness of breath   breath sounds clear to auscultation       Cardiovascular hypertension, Pt. on medications + Past MI and +CHF  + dysrhythmias + Valvular Problems/Murmurs AS  Rhythm:Regular Rate:Normal + Systolic murmurs    Neuro/Psych negative neurological ROS     GI/Hepatic negative GI ROS, Neg liver ROS,,,  Endo/Other  diabetesHypothyroidism    Renal/GU CRFRenal disease     Musculoskeletal  (+) Arthritis ,    Abdominal   Peds  Hematology  (+) Blood dyscrasia, anemia   Anesthesia Other Findings   Reproductive/Obstetrics                             Anesthesia Physical Anesthesia Plan  ASA: 4  Anesthesia Plan: MAC   Post-op Pain Management: Tylenol  PO (pre-op )* and Minimal or no pain anticipated   Induction:   PONV Risk Score and Plan: 2 and Propofol  infusion  Airway Management Planned: Natural Airway and Simple Face Mask  Additional Equipment:   Intra-op Plan:   Post-operative Plan:   Informed Consent: I have reviewed the patients History and Physical, chart, labs and discussed the procedure including the risks, benefits and alternatives for the proposed anesthesia with the patient or authorized representative who has indicated his/her understanding and acceptance.     Dental advisory given  Plan Discussed with: CRNA  Anesthesia Plan Comments: (PAT note by Rudy Costain, PA-C: 78 year old female follows with cardiology for history of severe aortic stenosis, trifascicular block, sick sinus syndrome awaiting pacemaker placement, HTN, combined heart failure.  Initially seen by cardiologist Dr. Lorie Rook on  03/17/2023 as a referral from nephrology for evaluation of bradycardia.  At that visit, patient also reported exertional chest pain.  Dr. Lorie Rook ordered event monitor, echo, cardiac PET/CT.  Monitor showed sinus bradycardia and occasional pauses.  Echo showed EF 55 to 60% with moderate aortic stenosis. Cardiac PET/CT 06/08/2023 was high risk showing findings consistent with infarction and peri-infarct ischemia in virtually the entire LAD artery distribution.  Dr. Lorie Rook commented on results stating, "Let her know that her stress test was quite abnormal and suggest that she may have a blockage in the heart. I would like to get an echocardiogram since her ejection fraction on the PET stress test is much lower than on her echocardiogram done in March. Please have her see me after the echocardiogram to discuss further. Her very poor kidney function will make cardiac catheterization more risky."  She was seen by EP cardiologist Dr. Arlester Ladd on 06/09/2023 for discussion of leadless pacemaker placement. Per note, "With risk for complete AV block given her significant AV conduction disease and monitor findings of paroxysmal sinus pauses, a Medtronic Micra AV may be the best option to give her ventricular backup pacing. If, in the future she develops sinus bradycardia, an atrial leadless pacemaker may be placed. --I will discuss further with her primary electrophysiologist Dr. Lawana Pray but at this point, we will go ahead and reserve her a spot for the procedure. Would employ general anesthesia for leadless device placement.Aaron AasAaron AasStage IV CKD: Followed by nephrology. With plans in process for obtaining dialysis access. Of some concern is that placement of a  leadless pacemaker would require contrast injection, and the amount is not always trivial with the dual-chamber leadless system."  She was subsequently admitted 5/24 through 06/15/2023 after presenting to the ED with complaints of shortness of breath.  BNP 1627, CXR with  bilateral pulmonary edema.  She was diuresed 6 L. Echo 5/26 with LVEF of 35-40%, hypokinesis of the mid-apical, anterior, anterolateral and apical wall, g2DD, moderately dilated LA,. severe low flow low gradient aortic stenosis, AVA 0.89, mean gradient .  She was discharged on Lasix  40 mg daily.  Outpatient follow-up was arranged for ongoing TAVR discussion with Dr. Lorie Rook.  She was not noted to have any bradycardia while inpatient.  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  GERD on PPI.  BMP and CBC 06/15/23 reviewed, mild hyponatremia sodium 131, creatinine elevated 3.11 consistent with CKD, anemia with hemoglobin 8.1.  History discussed with anesthesiologist Dr. Glenetta Lane.  Given significant cardiovascular comorbidities, he recommended proceeding with [facility] placement rather than fistula placement at this time.  I have relayed this to Dr. [name].  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  Rate 69. 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 Left ventricular hypertrophy with repolarization abnormality  TTE 06/14/2023: 1. Left ventricular ejection fraction, by estimation, is 35 to 40%. The  left ventricle has moderately decreased function. The left ventricle  demonstrates regional wall motion abnormalities severe hypokinesis of the  mid-apical anterior wall, the  mid-apical anterolateral wall, the apical septal wall, and the true apex.  There is mild concentric left ventricular hypertrophy. Left ventricular  diastolic parameters are consistent with Grade II diastolic dysfunction  (pseudonormalization).  2. Right ventricular systolic function is normal. The right ventricular  size is mildly enlarged. There is moderately elevated pulmonary artery  systolic pressure. The estimated right ventricular systolic pressure is  51.6 mmHg.  3. Left atrial size was moderately dilated.  4. Right atrial size was mildly dilated.  5. The  mitral valve is degenerative. Mild mitral valve regurgitation. No  evidence of mitral stenosis. Moderate mitral annular calcification.  6. The aortic valve is tricuspid. There is severe calcifcation of the  aortic valve. Aortic valve regurgitation is not visualized. Severe low  flow/low gradient aortic valve stenosis. Aortic valve area, by VTI  measures 0.89 cm. Aortic valve mean gradient  measures 26.0 mmHg.  7. The inferior vena cava is normal in size with <50% respiratory  variability, suggesting right atrial pressure of 8 mmHg.    Cardiac PET/CT 06/08/2023:   Findings are consistent with infarction with peri-infarct ischemia in virtually the entire LAD artery distribution. The study is high risk.   LV perfusion is abnormal. There is evidence of ischemia. There is evidence of infarction. Defect 1: There is a large defect with severe reduction in uptake present in the apical to mid anterior, anterolateral, anteroseptal and apex location(s) that is partially reversible. There is abnormal wall motion in the defect area. Consistent with infarction and peri-infarct ischemia. The defect is consistent with abnormal perfusion in the LAD territory.   Findings are surprising in a view of the recent normal LV wall motion and overall LV systolic function on the echocardiogram from 04/08/2023, but may reflect an interval anteroapical infarction. Consider repeat echo.   Calculated LV EF is probably unreliable due to the marked R-R interval variability (frequent PACs). Rest left ventricular function is abnormal. There was a single regional abnormality. Rest EF: 33%. Stress left ventricular function is  abnormal. There was a single regional abnormality. Stress EF: 15%. End diastolic cavity size is normal. End systolic cavity size is normal.   Myocardial blood flow was computed to be 0.67ml/g/min at rest and 0.58ml/g/min at stress. Global myocardial blood flow reserve was 0.74 and was highly abnormal.  MBFR  is 0.74. The inverted myocardial blood flow reserve is a highly unusual finding, possibly triggered by diastolic hypotension in a patient with aortic stenosis -related cardiomyopathy.   Coronary calcium was present on the attenuation correction CT images. Severe coronary calcifications were present. Coronary calcifications were present in the left anterior descending artery and right coronary artery distribution(s).   Electronically signed by Luana Rumple, MD   TTE 04/08/2023: 1. Left ventricular ejection fraction, by estimation, is 55 to 60%. Left  ventricular ejection fraction by 3D volume is 62 %. The left ventricle has  normal function. The left ventricle has no regional wall motion  abnormalities. There is mild left  ventricular hypertrophy. Left ventricular diastolic parameters are  indeterminate. The average left ventricular global longitudinal strain is  -19.8 %. The global longitudinal strain is normal.  2. Right ventricular systolic function is normal. The right ventricular  size is normal. There is normal pulmonary artery systolic pressure. The  estimated right ventricular systolic pressure is 24.3 mmHg.  3. Left atrial size was mildly dilated.  4. The mitral valve is degenerative. Mild mitral valve regurgitation. No  evidence of mitral stenosis. Severe mitral annular calcification.  5. The aortic valve is abnormal. There is severe calcifcation of the  aortic valve. Aortic valve regurgitation is not visualized. Moderate  aortic valve stenosis. Aortic valve area, by VTI measures 1.10 cm. Aortic  valve mean gradient measures 21.0 mmHg.  Aortic valve Vmax measures 3.16 m/s.  6. The inferior vena cava is normal in size with greater than 50%  respiratory variability, suggesting right atrial pressure of 3 mmHg.   )        Anesthesia Quick Evaluation

## 2023-06-17 ENCOUNTER — Inpatient Hospital Stay (HOSPITAL_COMMUNITY)
Admission: AD | Admit: 2023-06-17 | Discharge: 2023-06-20 | DRG: 291 | Disposition: A | Discharge status: Home Health | Attending: Internal Medicine | Admitting: Internal Medicine

## 2023-06-17 ENCOUNTER — Ambulatory Visit (HOSPITAL_COMMUNITY)

## 2023-06-17 ENCOUNTER — Other Ambulatory Visit: Payer: Self-pay

## 2023-06-17 ENCOUNTER — Ambulatory Visit: Admit: 2023-06-17 | Admitting: Vascular Surgery

## 2023-06-17 ENCOUNTER — Ambulatory Visit (HOSPITAL_COMMUNITY): Payer: Self-pay | Admitting: Physician Assistant

## 2023-06-17 ENCOUNTER — Encounter (HOSPITAL_COMMUNITY)
Admission: AD | Disposition: A | Discharge status: Home Health | Payer: Self-pay | Source: Home / Self Care | Attending: Internal Medicine

## 2023-06-17 ENCOUNTER — Encounter (HOSPITAL_COMMUNITY): Payer: Self-pay | Admitting: Vascular Surgery

## 2023-06-17 DIAGNOSIS — I5032 Chronic diastolic (congestive) heart failure: Secondary | ICD-10-CM

## 2023-06-17 DIAGNOSIS — I5043 Acute on chronic combined systolic (congestive) and diastolic (congestive) heart failure: Secondary | ICD-10-CM | POA: Diagnosis present

## 2023-06-17 DIAGNOSIS — I350 Nonrheumatic aortic (valve) stenosis: Secondary | ICD-10-CM

## 2023-06-17 DIAGNOSIS — Z881 Allergy status to other antibiotic agents status: Secondary | ICD-10-CM

## 2023-06-17 DIAGNOSIS — E440 Moderate protein-calorie malnutrition: Secondary | ICD-10-CM | POA: Insufficient documentation

## 2023-06-17 DIAGNOSIS — N185 Chronic kidney disease, stage 5: Secondary | ICD-10-CM | POA: Diagnosis not present

## 2023-06-17 DIAGNOSIS — I509 Heart failure, unspecified: Secondary | ICD-10-CM

## 2023-06-17 DIAGNOSIS — Z833 Family history of diabetes mellitus: Secondary | ICD-10-CM

## 2023-06-17 DIAGNOSIS — Z9049 Acquired absence of other specified parts of digestive tract: Secondary | ICD-10-CM

## 2023-06-17 DIAGNOSIS — Z79899 Other long term (current) drug therapy: Secondary | ICD-10-CM

## 2023-06-17 DIAGNOSIS — I10 Essential (primary) hypertension: Secondary | ICD-10-CM | POA: Diagnosis present

## 2023-06-17 DIAGNOSIS — I132 Hypertensive heart and chronic kidney disease with heart failure and with stage 5 chronic kidney disease, or end stage renal disease: Secondary | ICD-10-CM

## 2023-06-17 DIAGNOSIS — E119 Type 2 diabetes mellitus without complications: Secondary | ICD-10-CM

## 2023-06-17 DIAGNOSIS — N186 End stage renal disease: Secondary | ICD-10-CM

## 2023-06-17 DIAGNOSIS — Z882 Allergy status to sulfonamides status: Secondary | ICD-10-CM

## 2023-06-17 DIAGNOSIS — Z6835 Body mass index (BMI) 35.0-35.9, adult: Secondary | ICD-10-CM

## 2023-06-17 DIAGNOSIS — I2510 Atherosclerotic heart disease of native coronary artery without angina pectoris: Secondary | ICD-10-CM | POA: Diagnosis present

## 2023-06-17 DIAGNOSIS — Z992 Dependence on renal dialysis: Secondary | ICD-10-CM

## 2023-06-17 DIAGNOSIS — Z4901 Encounter for fitting and adjustment of extracorporeal dialysis catheter: Secondary | ICD-10-CM | POA: Diagnosis not present

## 2023-06-17 DIAGNOSIS — Z981 Arthrodesis status: Secondary | ICD-10-CM

## 2023-06-17 DIAGNOSIS — E039 Hypothyroidism, unspecified: Secondary | ICD-10-CM | POA: Diagnosis present

## 2023-06-17 DIAGNOSIS — E1122 Type 2 diabetes mellitus with diabetic chronic kidney disease: Secondary | ICD-10-CM | POA: Diagnosis not present

## 2023-06-17 DIAGNOSIS — Z79890 Hormone replacement therapy (postmenopausal): Secondary | ICD-10-CM

## 2023-06-17 DIAGNOSIS — R918 Other nonspecific abnormal finding of lung field: Secondary | ICD-10-CM | POA: Diagnosis not present

## 2023-06-17 DIAGNOSIS — Z91018 Allergy to other foods: Secondary | ICD-10-CM

## 2023-06-17 DIAGNOSIS — I5023 Acute on chronic systolic (congestive) heart failure: Secondary | ICD-10-CM | POA: Diagnosis not present

## 2023-06-17 DIAGNOSIS — R0989 Other specified symptoms and signs involving the circulatory and respiratory systems: Secondary | ICD-10-CM | POA: Diagnosis not present

## 2023-06-17 DIAGNOSIS — R0602 Shortness of breath: Secondary | ICD-10-CM | POA: Diagnosis not present

## 2023-06-17 DIAGNOSIS — E1139 Type 2 diabetes mellitus with other diabetic ophthalmic complication: Secondary | ICD-10-CM | POA: Diagnosis present

## 2023-06-17 DIAGNOSIS — Z807 Family history of other malignant neoplasms of lymphoid, hematopoietic and related tissues: Secondary | ICD-10-CM

## 2023-06-17 DIAGNOSIS — R0902 Hypoxemia: Secondary | ICD-10-CM | POA: Diagnosis present

## 2023-06-17 DIAGNOSIS — Z887 Allergy status to serum and vaccine status: Secondary | ICD-10-CM

## 2023-06-17 DIAGNOSIS — J811 Chronic pulmonary edema: Secondary | ICD-10-CM | POA: Diagnosis not present

## 2023-06-17 DIAGNOSIS — Z7982 Long term (current) use of aspirin: Secondary | ICD-10-CM

## 2023-06-17 DIAGNOSIS — Z8249 Family history of ischemic heart disease and other diseases of the circulatory system: Secondary | ICD-10-CM

## 2023-06-17 DIAGNOSIS — Z83719 Family history of colon polyps, unspecified: Secondary | ICD-10-CM

## 2023-06-17 DIAGNOSIS — I120 Hypertensive chronic kidney disease with stage 5 chronic kidney disease or end stage renal disease: Secondary | ICD-10-CM | POA: Diagnosis not present

## 2023-06-17 DIAGNOSIS — E785 Hyperlipidemia, unspecified: Secondary | ICD-10-CM | POA: Diagnosis present

## 2023-06-17 DIAGNOSIS — D649 Anemia, unspecified: Secondary | ICD-10-CM | POA: Diagnosis not present

## 2023-06-17 HISTORY — PX: INSERTION OF DIALYSIS CATHETER: SHX1324

## 2023-06-17 HISTORY — DX: Chronic kidney disease, stage 5: N18.5

## 2023-06-17 LAB — TSH: TSH: 2.136 u[IU]/mL (ref 0.350–4.500)

## 2023-06-17 LAB — HEPATIC FUNCTION PANEL
ALT: 22 U/L (ref 0–44)
AST: 28 U/L (ref 15–41)
Albumin: 3.6 g/dL (ref 3.5–5.0)
Alkaline Phosphatase: 80 U/L (ref 38–126)
Bilirubin, Direct: 0.2 mg/dL (ref 0.0–0.2)
Indirect Bilirubin: 0.8 mg/dL (ref 0.3–0.9)
Total Bilirubin: 1 mg/dL (ref 0.0–1.2)
Total Protein: 7.3 g/dL (ref 6.5–8.1)

## 2023-06-17 LAB — HEMOGLOBIN A1C
Hgb A1c MFr Bld: 6.5 % — ABNORMAL HIGH (ref 4.8–5.6)
Mean Plasma Glucose: 139.85 mg/dL

## 2023-06-17 LAB — GLUCOSE, CAPILLARY
Glucose-Capillary: 204 mg/dL — ABNORMAL HIGH (ref 70–99)
Glucose-Capillary: 218 mg/dL — ABNORMAL HIGH (ref 70–99)
Glucose-Capillary: 191 mg/dL — ABNORMAL HIGH (ref 70–99)
Glucose-Capillary: 170 mg/dL — ABNORMAL HIGH (ref 70–99)

## 2023-06-17 LAB — POCT I-STAT, CHEM 8
BUN: 54 mg/dL — ABNORMAL HIGH (ref 8–23)
Calcium, Ion: 1 mmol/L — ABNORMAL LOW (ref 1.15–1.40)
Chloride: 102 mmol/L (ref 98–111)
Creatinine, Ser: 3.5 mg/dL — ABNORMAL HIGH (ref 0.44–1.00)
Glucose, Bld: 201 mg/dL — ABNORMAL HIGH (ref 70–99)
HCT: 30 % — ABNORMAL LOW (ref 36.0–46.0)
Hemoglobin: 10.2 g/dL — ABNORMAL LOW (ref 12.0–15.0)
Potassium: 4 mmol/L (ref 3.5–5.1)
Sodium: 129 mmol/L — ABNORMAL LOW (ref 135–145)
TCO2: 17 mmol/L — ABNORMAL LOW (ref 22–32)

## 2023-06-17 LAB — HEPATITIS B SURFACE ANTIGEN: Hepatitis B Surface Ag: NONREACTIVE

## 2023-06-17 SURGERY — INSERTION OF DIALYSIS CATHETER
Anesthesia: Monitor Anesthesia Care | Site: Neck | Laterality: Right

## 2023-06-17 MED ORDER — PANTOPRAZOLE SODIUM 40 MG PO TBEC
60.0000 mg | DELAYED_RELEASE_TABLET | Freq: Every day | ORAL | Status: DC
  Filled 2023-06-17 (×2): qty 1

## 2023-06-17 MED ORDER — HEPARIN 6000 UNIT IRRIGATION SOLUTION
Status: DC | PRN
  Administered 2023-06-17: 1

## 2023-06-17 MED ORDER — CHLORHEXIDINE GLUCONATE 0.12 % MT SOLN
15.0000 mL | Freq: Once | OROMUCOSAL | Status: AC
  Administered 2023-06-17: 15 mL via OROMUCOSAL
  Filled 2023-06-17: qty 15

## 2023-06-17 MED ORDER — LIDOCAINE 2% (20 MG/ML) 5 ML SYRINGE
INTRAMUSCULAR | Status: AC
Start: 2023-06-17 — End: ?
  Filled 2023-06-17: qty 5

## 2023-06-17 MED ORDER — LIDOCAINE HCL (PF) 1 % IJ SOLN
INTRAMUSCULAR | Status: AC
Start: 2023-06-17 — End: ?
  Filled 2023-06-17: qty 30

## 2023-06-17 MED ORDER — PHENYLEPHRINE 80 MCG/ML (10ML) SYRINGE FOR IV PUSH (FOR BLOOD PRESSURE SUPPORT)
PREFILLED_SYRINGE | INTRAVENOUS | Status: AC
  Filled 2023-06-17: qty 10

## 2023-06-17 MED ORDER — PROPOFOL 10 MG/ML IV BOLUS
INTRAVENOUS | Status: DC | PRN
  Administered 2023-06-17 (×7): 10 mg via INTRAVENOUS

## 2023-06-17 MED ORDER — LEVOTHYROXINE SODIUM 50 MCG PO TABS
50.0000 ug | ORAL_TABLET | ORAL | Status: DC
  Administered 2023-06-18: 50 ug via ORAL
  Filled 2023-06-17: qty 1

## 2023-06-17 MED ORDER — ORAL CARE MOUTH RINSE: 15.0000 mL | Freq: Once | OROMUCOSAL | Status: AC

## 2023-06-17 MED ORDER — HEPARIN SODIUM (PORCINE) 1000 UNIT/ML IJ SOLN
INTRAMUSCULAR | Status: AC
Start: 2023-06-17 — End: ?
  Filled 2023-06-17: qty 3

## 2023-06-17 MED ORDER — CHLORHEXIDINE GLUCONATE CLOTH 2 % EX PADS
6.0000 | MEDICATED_PAD | Freq: Every day | CUTANEOUS | Status: DC
  Administered 2023-06-18: 6 via TOPICAL

## 2023-06-17 MED ORDER — PHENYLEPHRINE 80 MCG/ML (10ML) SYRINGE FOR IV PUSH (FOR BLOOD PRESSURE SUPPORT)
PREFILLED_SYRINGE | INTRAVENOUS | Status: DC | PRN
  Administered 2023-06-17 (×3): 40 ug via INTRAVENOUS

## 2023-06-17 MED ORDER — CHLORHEXIDINE GLUCONATE 4 % EX SOLN: 60.0000 mL | Freq: Once | CUTANEOUS | Status: DC

## 2023-06-17 MED ORDER — 0.9 % SODIUM CHLORIDE (POUR BTL) OPTIME
TOPICAL | Status: DC | PRN
Start: 2023-06-17 — End: 2023-06-17
  Administered 2023-06-17: 1000 mL

## 2023-06-17 MED ORDER — LIDOCAINE-EPINEPHRINE (PF) 1 %-1:200000 IJ SOLN
INTRAMUSCULAR | Status: DC | PRN
  Administered 2023-06-17: 10 mL

## 2023-06-17 MED ORDER — HYDRALAZINE HCL 25 MG PO TABS
25.0000 mg | ORAL_TABLET | Freq: Three times a day (TID) | ORAL | Status: DC
  Administered 2023-06-17 – 2023-06-19 (×6): 25 mg via ORAL
  Filled 2023-06-17 (×5): qty 1

## 2023-06-17 MED ORDER — HEPARIN SODIUM (PORCINE) 1000 UNIT/ML IJ SOLN
INTRAMUSCULAR | Status: AC
Start: 2023-06-17 — End: ?
  Filled 2023-06-17: qty 10

## 2023-06-17 MED ORDER — LEVOTHYROXINE SODIUM 100 MCG PO TABS
100.0000 ug | ORAL_TABLET | ORAL | Status: DC
  Administered 2023-06-19 – 2023-06-20 (×2): 100 ug via ORAL
  Filled 2023-06-17 (×2): qty 1

## 2023-06-17 MED ORDER — SODIUM CHLORIDE 0.9 % IV SOLN: INTRAVENOUS | Status: DC

## 2023-06-17 MED ORDER — PROPOFOL 500 MG/50ML IV EMUL
INTRAVENOUS | Status: DC | PRN
  Administered 2023-06-17: 20 ug/kg/min via INTRAVENOUS

## 2023-06-17 MED ORDER — ACETAMINOPHEN 650 MG RE SUPP: 650.0000 mg | Freq: Four times a day (QID) | RECTAL | Status: DC | PRN

## 2023-06-17 MED ORDER — HEPARIN SODIUM (PORCINE) 1000 UNIT/ML IJ SOLN
INTRAMUSCULAR | Status: AC
  Filled 2023-06-17: qty 4

## 2023-06-17 MED ORDER — HEPARIN SODIUM (PORCINE) 5000 UNIT/ML IJ SOLN
5000.0000 [IU] | Freq: Three times a day (TID) | INTRAMUSCULAR | Status: DC
  Administered 2023-06-17 – 2023-06-20 (×8): 5000 [IU] via SUBCUTANEOUS
  Filled 2023-06-17 (×7): qty 1

## 2023-06-17 MED ORDER — FUROSEMIDE 10 MG/ML IJ SOLN
40.0000 mg | Freq: Two times a day (BID) | INTRAMUSCULAR | Status: AC
  Administered 2023-06-17 – 2023-06-18 (×2): 40 mg via INTRAVENOUS
  Filled 2023-06-17 (×2): qty 4

## 2023-06-17 MED ORDER — FUROSEMIDE 10 MG/ML IJ SOLN
40.0000 mg | Freq: Once | INTRAMUSCULAR | Status: AC
  Administered 2023-06-17: 40 mg via INTRAVENOUS
  Filled 2023-06-17: qty 4

## 2023-06-17 MED ORDER — PROPOFOL 10 MG/ML IV BOLUS
INTRAVENOUS | Status: AC
  Filled 2023-06-17: qty 20

## 2023-06-17 MED ORDER — NITROGLYCERIN 0.4 MG SL SUBL: 0.4000 mg | SUBLINGUAL_TABLET | SUBLINGUAL | Status: DC | PRN

## 2023-06-17 MED ORDER — ATORVASTATIN CALCIUM 40 MG PO TABS
40.0000 mg | ORAL_TABLET | Freq: Every day | ORAL | Status: DC
  Administered 2023-06-17 – 2023-06-19 (×3): 40 mg via ORAL
  Filled 2023-06-17 (×3): qty 1

## 2023-06-17 MED ORDER — SODIUM CHLORIDE 0.9% FLUSH: 3.0000 mL | INTRAVENOUS | Status: DC | PRN

## 2023-06-17 MED ORDER — LIDOCAINE HCL (CARDIAC) PF 100 MG/5ML IV SOSY
PREFILLED_SYRINGE | INTRAVENOUS | Status: DC | PRN
  Administered 2023-06-17: 20 mg via INTRATRACHEAL

## 2023-06-17 MED ORDER — HYDRALAZINE HCL 20 MG/ML IJ SOLN: 5.0000 mg | INTRAMUSCULAR | Status: DC | PRN

## 2023-06-17 MED ORDER — HEPARIN SODIUM (PORCINE) 1000 UNIT/ML IJ SOLN
INTRAMUSCULAR | Status: DC | PRN
  Administered 2023-06-17: 10000 [IU] via INTRAVENOUS

## 2023-06-17 MED ORDER — FENTANYL CITRATE (PF) 250 MCG/5ML IJ SOLN
INTRAMUSCULAR | Status: AC
Start: 2023-06-17 — End: ?
  Filled 2023-06-17: qty 5

## 2023-06-17 MED ORDER — AMLODIPINE BESYLATE 5 MG PO TABS
5.0000 mg | ORAL_TABLET | Freq: Every day | ORAL | Status: DC
  Administered 2023-06-17 – 2023-06-19 (×3): 5 mg via ORAL
  Filled 2023-06-17 (×3): qty 1

## 2023-06-17 MED ORDER — INSULIN ASPART 100 UNIT/ML IJ SOLN
0.0000 [IU] | Freq: Three times a day (TID) | INTRAMUSCULAR | Status: DC
  Administered 2023-06-17: 3 [IU] via SUBCUTANEOUS
  Administered 2023-06-18: 11 [IU] via SUBCUTANEOUS
  Administered 2023-06-18 – 2023-06-19 (×2): 3 [IU] via SUBCUTANEOUS
  Administered 2023-06-19: 5 [IU] via SUBCUTANEOUS
  Administered 2023-06-20: 3 [IU] via SUBCUTANEOUS

## 2023-06-17 MED ORDER — SODIUM CHLORIDE 0.9% FLUSH
3.0000 mL | Freq: Two times a day (BID) | INTRAVENOUS | Status: DC
  Administered 2023-06-17 – 2023-06-20 (×6): 3 mL via INTRAVENOUS

## 2023-06-17 MED ORDER — HEPARIN 6000 UNIT IRRIGATION SOLUTION
Status: AC
Start: 2023-06-17 — End: ?
  Filled 2023-06-17: qty 500

## 2023-06-17 MED ORDER — VANCOMYCIN HCL IN DEXTROSE 1-5 GM/200ML-% IV SOLN
1000.0000 mg | INTRAVENOUS | Status: AC
  Administered 2023-06-17: 1000 mg via INTRAVENOUS
  Filled 2023-06-17: qty 200

## 2023-06-17 MED ORDER — INSULIN ASPART 100 UNIT/ML IJ SOLN
0.0000 [IU] | INTRAMUSCULAR | Status: AC | PRN
  Administered 2023-06-17 (×2): 2 [IU] via SUBCUTANEOUS
  Filled 2023-06-17 (×2): qty 1

## 2023-06-17 MED ORDER — ACETAMINOPHEN 325 MG PO TABS: 650.0000 mg | ORAL_TABLET | Freq: Four times a day (QID) | ORAL | Status: DC | PRN

## 2023-06-17 SURGICAL SUPPLY — 40 items
BAG COUNTER SPONGE SURGICOUNT (BAG) ×2 IMPLANT
BAG DECANTER FOR FLEXI CONT (MISCELLANEOUS) ×2 IMPLANT
BIOPATCH RED 1 DISK 7.0 (GAUZE/BANDAGES/DRESSINGS) ×2 IMPLANT
CATH PALINDROME-P 19CM W/VT (CATHETERS) IMPLANT
CATH PALINDROME-P 28CM W/VT (CATHETERS) IMPLANT
CLIP TI MEDIUM 6 (CLIP) ×2 IMPLANT
CLIP TI WIDE RED SMALL 6 (CLIP) ×2 IMPLANT
COVER PROBE W GEL 5X96 (DRAPES) ×2 IMPLANT
COVER SURGICAL LIGHT HANDLE (MISCELLANEOUS) ×2 IMPLANT
DERMABOND ADVANCED .7 DNX12 (GAUZE/BANDAGES/DRESSINGS) ×2 IMPLANT
DERMABOND ADVANCED .7 DNX6 (GAUZE/BANDAGES/DRESSINGS) IMPLANT
DRAPE C-ARM 42X72 X-RAY (DRAPES) ×2 IMPLANT
DRAPE CHEST BREAST 15X10 FENES (DRAPES) ×2 IMPLANT
DRSG COVADERM 4X6 (GAUZE/BANDAGES/DRESSINGS) IMPLANT
GAUZE 4X4 16PLY ~~LOC~~+RFID DBL (SPONGE) ×2 IMPLANT
GLOVE BIOGEL PI IND STRL 7.0 (GLOVE) ×2 IMPLANT
GOWN STRL REUS W/ TWL LRG LVL3 (GOWN DISPOSABLE) ×2 IMPLANT
GOWN STRL REUS W/ TWL XL LVL3 (GOWN DISPOSABLE) ×2 IMPLANT
KIT BASIN OR (CUSTOM PROCEDURE TRAY) ×2 IMPLANT
KIT PALINDROME-P 55CM (CATHETERS) IMPLANT
KIT TURNOVER KIT B (KITS) ×2 IMPLANT
NDL 18GX1X1/2 (RX/OR ONLY) (NEEDLE) ×2 IMPLANT
NDL HYPO 25GX1X1/2 BEV (NEEDLE) ×2 IMPLANT
NEEDLE 18GX1X1/2 (RX/OR ONLY) (NEEDLE) ×1 IMPLANT
NEEDLE HYPO 25GX1X1/2 BEV (NEEDLE) ×2 IMPLANT
NS IRRIG 1000ML POUR BTL (IV SOLUTION) ×2 IMPLANT
PACK SRG BSC III STRL LF ECLPS (CUSTOM PROCEDURE TRAY) ×2 IMPLANT
PAD ARMBOARD POSITIONER FOAM (MISCELLANEOUS) ×4 IMPLANT
POWDER SURGICEL 3.0 GRAM (HEMOSTASIS) IMPLANT
SOAP 2 % CHG 4 OZ (WOUND CARE) ×2 IMPLANT
SUT ETHILON 3 0 PS 1 (SUTURE) ×2 IMPLANT
SUT MNCRL AB 4-0 PS2 18 (SUTURE) ×2 IMPLANT
SYR 10ML LL (SYRINGE) ×2 IMPLANT
SYR 20ML LL LF (SYRINGE) ×4 IMPLANT
SYR 5ML LL (SYRINGE) ×2 IMPLANT
SYR CONTROL 10ML LL (SYRINGE) ×2 IMPLANT
TOWEL GREEN STERILE (TOWEL DISPOSABLE) ×2 IMPLANT
TOWEL GREEN STERILE FF (TOWEL DISPOSABLE) ×4 IMPLANT
WATER STERILE IRR 1000ML POUR (IV SOLUTION) ×2 IMPLANT
WIRE TORQFLEX AUST .018X40CM (WIRE) IMPLANT

## 2023-06-17 NOTE — H&P (Signed)
 Patient was recently admitted with acute cardiac issues and is likely being worked up for TAVR and PPM.  She is now also in acute need of dialysis.  After speaking with cardiology and nephrology we are in agreement that she will only have TDC placement in order to avoid a longer procedure with permanent arm access at this time.  Will see her back in clinic after her cardiology procedures to discuss permanent arm access.   After discussing the risks and benefits of TDC placement, Rhonda Clayton elected to proceed.   Philipp Brawn MD    _____________________________________________________________________     [name], female   DOB: 10-13-1945, 78 y.o.   MRN: [id number]   Reason for Consult: New Patient (Initial Visit)   Referred by Charley Constable, MD   Subjective:    Subjective HPI   Rhonda Clayton is a 78 y.o. female who presents for evaluation HD access creation.    Past Medical History  Past Medical History: No date: Anemia No date: CKD (chronic kidney disease) No date: Cystocele No date: DJD (degenerative joint disease) No date: DM (diabetes mellitus) (HCC) No date: Esophageal stricture No date: Essential hypertension No date: Hyperlipidemia No date: Hypothyroidism No date: Lichen sclerosus No date: Osteopenia No date: RBBB with left anterior fascicular block No date: Rectocele No date: Stress incontinence No date: Torn rotator cuff     Comment:  Bilateral No date: Type 2 diabetes mellitus (HCC) No date: Venous insufficiency 05/25/2019: Vitreous hemorrhage, left (HCC)     Comment:  The nature of the vitreous hemorrhage was discussed with              the patient as well as the common causes.   Patients with              diabetic eye disease may develop retinal               neovascularization.  Other eye conditions develop retinal              neovascularization secondary to retinal venous               occlusions.   Vitreous hemorrhage  may result from               spontaneous vitreous detachment or retinal breaks.  Blunt              trauma is a common cause as wl          Family History  Problem Relation Age of Onset   Multiple myeloma Mother     Cancer Maternal Aunt          Cancer in the mouth - dipped snuff   Heart disease Brother     Diabetes Father     Colon polyps Brother     Heart disease Maternal Uncle     Heart disease Paternal Uncle     Heart disease Paternal Grandfather               Past Surgical History:  Procedure Laterality Date   ABDOMINAL EXPOSURE N/A 05/09/2014    Procedure: ABDOMINAL EXPOSURE;  Surgeon: Mayo Speck, MD;  Location: [facility];  Service: Vascular;  Laterality: N/A;   ANTERIOR LAT LUMBAR FUSION Right 05/09/2014    Procedure: Lumbar three-four, Lumbar four-five Anterior lateral lumbar fusion;  Surgeon: Manya Sells, MD;  Location: [facility];  Service: Neurosurgery;  Laterality: Right;  ANTERIOR LUMBAR FUSION N/A 05/09/2014    Procedure: Stage 1:Lumbar four-five, Lumbar five-Sacral one Anterior lumbar interbody fusion with Dr. Emmelina Douglas for approach ;  Surgeon: Manya Sells, MD;  Location: [facility];  Service: Neurosurgery;  Laterality: N/A;   CHOLECYSTECTOMY   1987   INCONTINENCE SURGERY       LUMBAR PERCUTANEOUS PEDICLE SCREW 3 LEVEL N/A 05/10/2014    Procedure: Stage 2: Percutaneous Pedicle Screws; Laminectomy L3-4  ;  Surgeon: Manya Sells, MD;  Location: [facility];  Service: Neurosurgery;  Laterality: N/A;  Stage 2: Percutaneous Pedicle Screws T10 to Ileum; Laminectomy L3-4      RECTAL SURGERY       VAGINA SURGERY   2018    Rhonda Clayton- Vaginal Dryness          Short Social History:  Social History        Tobacco Use   Smoking status: Never   Smokeless tobacco: Never  Substance Use Topics   Alcohol  use: No      Allergies       Allergies  Allergen Reactions   Coffee Flavoring Agent (Non-Screening) Other (See Comments)      Sick on stomach,  sneezing, backed up   Azithromycin  Other (See Comments)   Molds & Smuts        Allergy test showed    Pneumococcal Vac Polyvalent Other (See Comments)      Large site reaction    Cephalexin  Other (See Comments)      Yeast infection   Ciprofloxacin Other (See Comments)      tendinitis   Oatmeal Other (See Comments)      Sneezing and drainage   Sulfa Antibiotics Other (See Comments)      Yeast infection              Current Outpatient Medications  Medication Sig Dispense Refill   amLODipine  (NORVASC ) 2.5 MG tablet Take 5 mg by mouth at bedtime.       B-D UF III MINI PEN NEEDLES 31G X 5 MM MISC Inject into the skin 2 (two) times daily.       betamethasone valerate (VALISONE) 0.1 % cream SMARTSIG:sparingly Topical Daily       Continuous Glucose Sensor (FREESTYLE LIBRE 3 SENSOR) MISC change every 14 days topically to monitor blood glucose continuously 28 days       furosemide  (LASIX ) 40 MG tablet Take 20-40 mg by mouth See admin instructions. Take 40 by mouth once daily on Monday, Wednesday, Friday. Take 20 mg once daily on Tuesday, Thursday, Saturday, and Sunday.       hydrALAZINE (APRESOLINE) 25 MG tablet Take 25 mg by mouth 3 (three) times daily.       Lancets (ONETOUCH DELICA PLUS LANCET33G) MISC Apply topically 3 (three) times daily.       levothyroxine  (SYNTHROID ) 100 MCG tablet Take 100 mcg by mouth daily before breakfast.       MECLIZINE HCL PO Take 0.5 tablets by mouth as needed (dizziness).       NOVOLOG  MIX 70/30 FLEXPEN (70-30) 100 UNIT/ML FlexPen Inject 20 Units into the skin in the morning and at bedtime.       ONETOUCH VERIO test strip 3 (three) times daily.       betamethasone dipropionate (DIPROLENE) 0.05 % cream Apply 1 Application topically as needed (skin condition). (Patient not taking: Reported on 05/04/2023)          No current facility-administered medications for this visit.  REVIEW OF SYSTEMS    All other systems were reviewed and are negative        Objective:    Objective[] Expand by Default    Vitals:    05/07/23 1531  BP: (!) 143/52  Pulse: (!) 38  Resp: 18  Temp: 97.8 F (36.6 C)  TempSrc: Temporal  SpO2: 98%  Weight: 191 lb 6.4 oz (86.8 kg)  Height: 5' 5.1" (1.654 m)    Body mass index is 31.75 kg/m.   Physical Exam General: no acute distress Cardiac: hemodynamically stable Pulm: normal work of breathing Neuro: alert, no focal deficit Extremities: No incisions, no edema or wounds Vascular:              Right: palpable brachial, radial             Left: palpable brachial, radial     Data: UE arterial duplex Right Pre-Dialysis Findings:  +-----------------------+----------+--------------------+--------+--------+   Location              PSV (cm/s)Intralum. Diam.  (cm)WaveformComments  +-----------------------+----------+--------------------+--------+--------+   Brachial Antecub. fossa53        0.48                biphasic           +-----------------------+----------+--------------------+--------+--------+   Radial Art at Wrist    72        0.30                biphasic           +-----------------------+----------+--------------------+--------+--------+   Ulnar Art at Wrist     64        0.11                biphasic           +-----------------------+----------+--------------------+--------+--------+    Left Pre-Dialysis Findings:  +-----------------------+----------+--------------------+--------+--------+   Location              PSV (cm/s)Intralum. Diam.  (cm)WaveformComments  +-----------------------+----------+--------------------+--------+--------+   Brachial Antecub. fossa63        0.52                biphasic           +-----------------------+----------+--------------------+--------+--------+   Radial Art at Wrist    65        0.21                biphasic           +-----------------------+----------+--------------------+--------+--------+    Ulnar Art at Wrist     57        0.25                biphasic           +-----------------------+----------+--------------------+--------+--------+      Vein mapping +-----------------+-------------+----------+---------+  Right Cephalic   Diameter (cm)Depth (cm)Findings   +-----------------+-------------+----------+---------+  Prox upper arm       0.35        1.17              +-----------------+-------------+----------+---------+  Mid upper arm        0.37        0.83              +-----------------+-------------+----------+---------+  Dist upper arm       0.42        0.62              +-----------------+-------------+----------+---------+  Antecubital fossa  0.65        0.13   branching  +-----------------+-------------+----------+---------+  Prox forearm         0.28        0.28   branching  +-----------------+-------------+----------+---------+  Mid forearm          0.34        0.31              +-----------------+-------------+----------+---------+  Dist forearm         0.32        0.11              +-----------------+-------------+----------+---------+   +-----------------+-------------+----------+---------+  Right Basilic    Diameter (cm)Depth (cm)Findings   +-----------------+-------------+----------+---------+  Prox upper arm       1.04        1.25              +-----------------+-------------+----------+---------+  Mid upper arm        0.44        1.84              +-----------------+-------------+----------+---------+  Dist upper arm       0.38        1.80              +-----------------+-------------+----------+---------+  Antecubital fossa    0.26        0.38              +-----------------+-------------+----------+---------+  Prox forearm         0.26        0.23   branching  +-----------------+-------------+----------+---------+  Mid forearm          0.11        0.16               +-----------------+-------------+----------+---------+  Distal forearm       0.09        0.11              +-----------------+-------------+----------+---------+   +-----------------+-------------+----------+--------+  Left Cephalic    Diameter (cm)Depth (cm)Findings  +-----------------+-------------+----------+--------+  Prox upper arm       0.36        1.54             +-----------------+-------------+----------+--------+  Mid upper arm        0.21        0.72             +-----------------+-------------+----------+--------+  Dist upper arm       0.47        0.23             +-----------------+-------------+----------+--------+  Antecubital fossa    0.26        0.15             +-----------------+-------------+----------+--------+  Prox forearm         0.13        0.28             +-----------------+-------------+----------+--------+  Mid forearm          0.10        0.03             +-----------------+-------------+----------+--------+  Dist forearm         0.22        0.14             +-----------------+-------------+----------+--------+   +-----------------+-------------+----------+---------+  Left Basilic     Diameter (cm)Depth (cm)Findings   +-----------------+-------------+----------+---------+  Prox upper arm       0.42        1.32              +-----------------+-------------+----------+---------+  Mid upper arm        0.55        1.32              +-----------------+-------------+----------+---------+  Dist upper arm       0.30        0.64              +-----------------+-------------+----------+---------+  Antecubital fossa    0.27        0.42              +-----------------+-------------+----------+---------+  Prox forearm         0.28        0.50   branching  +-----------------+-------------+----------+---------+  Mid forearm          0.28        0.31               +-----------------+-------------+----------+---------+  Distal forearm       0.16        0.11              +-----------------+-------------+----------+---------+    Note from nephrologist reviewed AVF now, weight on AVG        Assessment/Plan:    Assessment Rhonda Clayton is a 78 y.o. female with CKD stage V who presents to discuss permanent access creation.  Dominant hand: right Previous access surgeries: none Previous catheters: now Other arm surgeries/injuries: none   The vein mapping suggests there is suitable superficial veins and we discussed that they are a candidate for AVF creation     The risks an benefits including of access creation were reviewed including: need for additional procedures, need for additional creations, steal, ischemia monomelic neuropathy, failure of access, and bleeding. The patient expressed understand and is willing to proceed.    She is interested in PD and would like to pursue that option first.  I explained that I will message my partners who placed PD catheters to see if she is a candidate.  She has had an open gallbladder surgery in the 90s with an incision in the right upper quadrant as well as an ALIF but when reviewing the op notes it does not appear that the peritoneal cavity was entered.   I will message her nephrologist as well to determine if peritoneal dialysis would be an option.   If not we will plan to schedule her for left arm brachio cephalic fistula in the coming weeks. She is also having a pacemaker placed, I will message her cardiologist and electrophysiologist regarding possible placement of her pacemaker on the right to preserve her left central system for fistula outflow       Philipp Brawn MD [facility]

## 2023-06-17 NOTE — H&P (Addendum)
 History and Physical    Patient: Rhonda Clayton XTG:[id number] DOB: 11-24-1945 DOA: 06/17/2023 DOS: the patient was seen and examined on 06/17/2023 PCP: Lonzie Robins, MD  Patient coming from: PACU direct admit  Chief complaint:   HPI:  Rhonda Clayton is a 78 y.o. female with past medical history  of   aortic stenosis, CKD V, diabetes mellitus, hypothyroidism, last discharged on 27 May by cardiology for acute on chronic combined congestive heart failure, aortic stenosis-currently ongoing TAVR discussion, CKD stage IV, CAD, bradycardia, diabetes mellitus type 2, hypertension seen today in PACU as a direct admit after having Right IJ tunneled dialysis catheter placement by vascular MD Dr. [name] for temporary hemodialysis need.  Admission requested to medicine for ongoing management and heart failure and diabetes, patient's been seen by nephrology and cardiology has been consulted by them.  Pt at bedside is alert awake oriented afebrile.   Vitals:   06/17/23 1500 06/17/23 1518 06/17/23 1548 06/17/23 1600  BP:  139/67 (!) 130/59 (!) 139/58  Pulse: 69 (!) 24 63 64  Temp: 98 F (36.7 C) 97.9 F (36.6 C)    Resp: (!) 25 (!) 22 20 19   Height:      Weight:  83.9 kg    SpO2: 91% 93% 99% 99%  TempSrc:      BMI (Calculated):  34.97    BMP showing creatinine of 3.50 BUN of 54 glucose 201 sodium 129 ionized calcium 1.0, hemoglobin of 10.2.  Medications given so far include: Medications  sodium chloride  flush (NS) 0.9 % injection 3-10 mL (has no administration in time range)  chlorhexidine  (HIBICLENS ) 4 % liquid 4 Application (has no administration in time range)    And  chlorhexidine  (HIBICLENS ) 4 % liquid 4 Application (has no administration in time range)  0.9 %  sodium chloride  infusion ( Intravenous Restarted 06/17/23 1317)  Chlorhexidine  Gluconate Cloth 2 % PADS 6 each (has no administration in time range)  vancomycin  (VANCOCIN ) IVPB 1000 mg/200 mL premix (0 mg Intravenous Stopped  06/17/23 1234)  chlorhexidine  (PERIDEX ) 0.12 % solution 15 mL (15 mLs Mouth/Throat Given 06/17/23 0918)    Or  Oral care mouth rinse ( Mouth Rinse See Alternative 06/17/23 0918)  insulin  aspart (novoLOG ) injection 0-7 Units (2 Units Subcutaneous Given 06/17/23 1152)  furosemide  (LASIX ) injection 40 mg (40 mg Intravenous Given 06/17/23 1057)   Review of Systems  Respiratory:  Positive for shortness of breath.   Cardiovascular:  Positive for leg swelling.  Neurological:  Positive for weakness.   Past Medical History:  Diagnosis Date   Anemia    CKD (chronic kidney disease) stage 5, GFR less than 15 ml/min (HCC)    Cystocele    DJD (degenerative joint disease)    DM (diabetes mellitus) (HCC)    Esophageal stricture    Essential hypertension    Hyperlipidemia    Hypothyroidism    Lichen sclerosus    Osteopenia    RBBB with left anterior fascicular block    Rectocele    Stress incontinence    Torn rotator cuff    Bilateral   Type 2 diabetes mellitus (HCC)    Venous insufficiency    Vitreous hemorrhage, left (HCC) 05/25/2019   The nature of the vitreous hemorrhage was discussed with the patient as well as the common causes.   Patients with diabetic eye disease may develop retinal neovascularization.  Other eye conditions develop retinal  neovascularization secondary to retinal venous occlusions.   Vitreous hemorrhage may result  from spontaneous vitreous detachment or retinal breaks.  Blunt trauma is a common cause as wl   Past Surgical History:  Procedure Laterality Date   ABDOMINAL EXPOSURE N/A 05/09/2014   Procedure: ABDOMINAL EXPOSURE;  Surgeon: Mayo Speck, MD;  Location: [facility];  Service: Vascular;  Laterality: N/A;   ANTERIOR LAT LUMBAR FUSION Right 05/09/2014   Procedure: Lumbar three-four, Lumbar four-five Anterior lateral lumbar fusion;  Surgeon: Manya Sells, MD;  Location: [facility];  Service: Neurosurgery;  Laterality: Right;   ANTERIOR LUMBAR FUSION N/A 05/09/2014    Procedure: Stage 1:Lumbar four-five, Lumbar five-Sacral one Anterior lumbar interbody fusion with Dr. Wende Douglas for approach ;  Surgeon: Manya Sells, MD;  Location: [facility];  Service: Neurosurgery;  Laterality: N/A;   CHOLECYSTECTOMY  1987   INCONTINENCE SURGERY     LUMBAR PERCUTANEOUS PEDICLE SCREW 3 LEVEL N/A 05/10/2014   Procedure: Stage 2: Percutaneous Pedicle Screws; Laminectomy L3-4  ;  Surgeon: Manya Sells, MD;  Location: [facility];  Service: Neurosurgery;  Laterality: N/A;  Stage 2: Percutaneous Pedicle Screws T10 to Ileum; Laminectomy L3-4     RECTAL SURGERY     VAGINA SURGERY  2018   [facility] Touch- Vaginal Dryness    reports that she has never smoked. She has never used smokeless tobacco. 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 She reports that she does not drink alcohol . Allergies  Allergen Reactions   Coffee Flavoring Agent (Non-Screening) Other (See Comments)    Sick on stomach, sneezing, backed up   Azithromycin  Other (See Comments)   Fluogen [Influenza Virus Vaccine]    Molds & Smuts     Allergy test showed    Pneumococcal Vac Polyvalent Other (See Comments)    Large site reaction    Cephalexin  Other (See Comments)    Yeast infection   Ciprofloxacin Other (See Comments)    tendinitis   Oatmeal Other (See Comments)    Sneezing and drainage   Sulfa Antibiotics Other (See Comments)    Yeast infection   Family History  Problem Relation Age of Onset   Multiple myeloma Mother    Cancer Maternal Aunt        Cancer in the mouth - dipped snuff   Heart disease Brother    Diabetes Father    Colon polyps Brother    Heart disease Maternal Uncle    Heart disease Paternal Uncle    Heart disease Paternal Grandfather    Prior to Admission medications   Medication Sig Start Date End Date Taking? Authorizing Provider  amLODipine  (NORVASC ) 5 MG tablet Take 1 tablet (5 mg total) by mouth at bedtime. 06/15/23  Yes Sanjuanita Cruz, NP  aspirin EC 81 MG tablet  Take 1 tablet (81 mg total) by mouth daily. Swallow whole. 06/16/23  Yes Sanjuanita Cruz, NP  atorvastatin (LIPITOR) 40 MG tablet Take 1 tablet (40 mg total) by mouth at bedtime. 06/15/23  Yes Sanjuanita Cruz, NP  furosemide  (LASIX ) 40 MG tablet Take 1 tablet (40 mg total) by mouth See admin instructions. 06/15/23  Yes Johnie Nailer B, NP  hydrALAZINE (APRESOLINE) 25 MG tablet Take 25 mg by mouth 3 (three) times daily. 02/22/23  Yes [provider]  levothyroxine  (SYNTHROID ) 100 MCG tablet Take 100 mcg by mouth See admin instructions. Takes 1 tablet daily except for Monday and Friday she takes 0.5 tablet   Yes [provider]  NOVOLOG  MIX 70/30 FLEXPEN (70-30) 100 UNIT/ML FlexPen Inject into  the skin in the morning and at bedtime. Takes 15 units in the morning and 9-10 units at bedtime   Yes [provider]  B-D UF III MINI PEN NEEDLES 31G X 5 MM MISC Inject into the skin 2 (two) times daily. 03/01/23   [provider]  betamethasone valerate (VALISONE) 0.1 % cream Apply 1 Application topically as needed (skin condition). 02/23/23   [provider]  Continuous Glucose Sensor (FREESTYLE LIBRE 3 SENSOR) MISC change every 14 days topically to monitor blood glucose continuously 28 days 03/01/23   [provider]  Lancets [facility] DELICA PLUS [place]) MISC Apply topically 3 (three) times daily. 02/22/23   [provider]  nitroGLYCERIN  (NITROSTAT ) 0.4 MG SL tablet Place 1 tablet (0.4 mg total) under the tongue every 5 (five) minutes as needed for chest pain. 06/01/23 08/30/23  Camnitz, Babetta Lesch, MD  [facility] VERIO test strip 3 (three) times daily. 03/03/23   [provider]  pantoprazole  (PROTONIX ) 40 MG tablet Take 1.5 tablets by mouth daily. 06/03/23   [provider]                                                                                 Vitals:   06/17/23 1500 06/17/23 1518 06/17/23 1548 06/17/23 1600  BP:   139/67 (!) 130/59 (!) 139/58  Pulse: 69 (!) 24 63 64  Resp: (!) 25 (!) 22 20 19   Temp: 98 F (36.7 C) 97.9 F (36.6 C)    TempSrc:      SpO2: 91% 93% 99% 99%  Weight:  83.9 kg    Height:       Physical Exam Vitals and nursing note reviewed.  Constitutional:      General: She is not in acute distress. HENT:     Head: Normocephalic and atraumatic.     Right Ear: Hearing normal.     Left Ear: Hearing normal.     Nose: No nasal deformity.     Mouth/Throat:     Lips: Pink.  Eyes:     General: Lids are normal.     Extraocular Movements: Extraocular movements intact.  Cardiovascular:     Rate and Rhythm: Normal rate and regular rhythm.     Heart sounds: Normal heart sounds.  Pulmonary:     Effort: Pulmonary effort is normal.     Breath sounds: Rales present.  Abdominal:     General: Bowel sounds are normal. There is no distension.     Palpations: Abdomen is soft. There is no mass.     Tenderness: There is no abdominal tenderness.  Musculoskeletal:     Right lower leg: No edema.     Left lower leg: No edema.  Skin:    General: Skin is warm.  Neurological:     General: No focal deficit present.     Mental Status: She is alert and oriented to person, place, and time.     Cranial Nerves: Cranial nerves 2-12 are intact.  Psychiatric:        Speech: Speech normal.     Labs on Admission: I have personally reviewed following labs and imaging studies CBC: Recent Labs  Lab  06/12/23 1856 06/13/23 0804 06/14/23 0437 06/15/23 0419 06/17/23 0913  WBC 9.3 6.7 6.2 7.0  --   HGB 9.3* 8.6* 8.2* 8.1* 10.2*  HCT 29.3* 26.4* 25.0* 25.0* 30.0*  MCV 88.0 85.7 85.9 86.5  --   PLT 434* 446* 415* 425*  --    Basic Metabolic Panel: Recent Labs  Lab 06/12/23 1856 06/13/23 0804 06/14/23 0758 06/15/23 0419 06/17/23 0913  NA 127* 131* 130* 131* 129*  K 3.2* 3.4* 3.7 4.2 4.0  CL 96* 98 100 100 102  CO2 18* 19* 21* 21*  --   GLUCOSE 141* 215* 193* 133* 201*  BUN 55* 50* 51* 52*  54*  CREATININE 3.20* 3.19* 3.19* 3.11* 3.50*  CALCIUM 8.9 8.7* 8.7* 8.7*  --    GFR: Estimated Creatinine Clearance: 12.6 mL/min (A) (by C-G formula based on SCr of 3.5 mg/dL (H)). Liver Function Tests: Recent Labs  Lab 06/17/23 1453  AST 28  ALT 22  ALKPHOS 80  BILITOT 1.0  PROT 7.3  ALBUMIN 3.6   No results for input(s): "LIPASE", "AMYLASE" in the last 168 hours. No results for input(s): "AMMONIA" in the last 168 hours. Coagulation Profile: No results for input(s): "INR", "PROTIME" in the last 168 hours. Cardiac Enzymes: No results for input(s): "CKTOTAL", "CKMB", "CKMBINDEX", "TROPONINI" in the last 168 hours. BNP (last 3 results) No results for input(s): "PROBNP" in the last 8760 hours. HbA1C: Recent Labs    06/17/23 1453  HGBA1C 6.5*   CBG: Recent Labs  Lab 06/15/23 0755 06/15/23 1207 06/17/23 0906 06/17/23 1112 06/17/23 1337  GLUCAP 155* 198* 204* 218* 191*   Lipid Profile: No results for input(s): "CHOL", "HDL", "LDLCALC", "TRIG", "CHOLHDL", "LDLDIRECT" in the last 72 hours. Thyroid  Function Tests: Recent Labs    06/17/23 1453  TSH 2.136   Anemia Panel: No results for input(s): "VITAMINB12", "FOLATE", "FERRITIN", "TIBC", "IRON ", "RETICCTPCT" in the last 72 hours. Urine analysis:    Component Value Date/Time   COLORURINE STRAW (A) 01/03/2023 1102   APPEARANCEUR CLEAR 01/03/2023 1102   LABSPEC 1.009 01/03/2023 1102   PHURINE 6.0 01/03/2023 1102   GLUCOSEU 150 (A) 01/03/2023 1102   HGBUR SMALL (A) 01/03/2023 1102   BILIRUBINUR NEGATIVE 01/03/2023 1102   BILIRUBINUR negative 05/02/2015 1724   BILIRUBINUR neg 03/01/2011 1243   KETONESUR NEGATIVE 01/03/2023 1102   PROTEINUR 100 (A) 01/03/2023 1102   UROBILINOGEN 0.2 05/02/2015 1724   UROBILINOGEN 0.2 03/31/2007 0750   NITRITE NEGATIVE 01/03/2023 1102   LEUKOCYTESUR NEGATIVE 01/03/2023 1102   Radiological Exams on Admission: DG CHEST PORT 1 VIEW Result Date: 06/17/2023 CLINICAL DATA:  End-stage  renal disease EXAM: PORTABLE CHEST 1 VIEW COMPARISON:  06/12/2023 FINDINGS: New right-sided central venous catheter with tip at the cavoatrial junction. Stable cardiomediastinal silhouette with vascular congestion and pulmonary edema. Probable small pleural effusions. Aortic atherosclerosis. Posterior thoracolumbar region spinal hardware IMPRESSION: 1. New right-sided central venous catheter with tip at the cavoatrial junction. No pneumothorax 2. Vascular congestion and pulmonary edema with probable small pleural effusions. Electronically Signed   By: Esmeralda Hedge M.D.   On: 06/17/2023 15:51   Data Reviewed: Relevant notes from primary care and specialist visits, past discharge summaries as available in EHR, including Care Everywhere. Prior diagnostic testing as pertinent to current admission diagnoses, Updated medications and problem lists for reconciliation ED course, including vitals, labs, imaging, treatment and response to treatment,Triage notes, nursing and pharmacy notes and ED provider's notes Notable results as noted in HPI.Discussed case with EDMD/ ED APP/  or Specialty MD on call and as needed.  Assessment & Plan  >> Hemodialysis access/CKD stage V: Per chart review patient is interested in PD currently had a right IJ TDC placed. Nephrology has been consulted.   >> Acute on chronic combined CHF: Patient received Lasix  40 mg IV today, strict I's and O's, daily weights. Cardiology has been consulted.  >>Hypoxia: O2 sat of 91 on RA on tele, and pt with rales had asked nurse to start 2L [place].   >> Diabetes mellitus type 2: Glycemic protocol and sliding scale insulin  regimen.   >> Hypothyroidism: Will continue patient on her levothyroxine  at 100 mcg.   >> Essential hypertension: Vitals:   06/17/23 1415 06/17/23 1430 06/17/23 1445 06/17/23 1518  BP: 133/65 (!) 127/59 134/60 139/67   06/17/23 1548 06/17/23 1600 06/17/23 1700 06/17/23 1730  BP: (!) 130/59 (!) 139/58 (!) 106/48  119/74   06/17/23 1801 06/17/23 1830 06/17/23 1848 06/17/23 1850  BP: (!) 104/33 (!) 103/52 (!) 119/55 (!) 119/55  Will continue patient on her hydralazine , amlodipine .  As needed hydralazine .   DVT prophylaxis:  Heparin    Consults:  Nephrology Cardiology  Advance Care Planning:    Code Status: Full Code   Family Communication:  None Disposition Plan:  Home Severity of Illness: The appropriate patient status for this patient is OBSERVATION. Observation status is judged to be reasonable and necessary in order to provide the required intensity of service to ensure the patient's safety. The patient's presenting symptoms, physical exam findings, and initial radiographic and laboratory data in the context of their medical condition is felt to place them at decreased risk for further clinical deterioration. Furthermore, it is anticipated that the patient will be medically stable for discharge from the hospital within 2 midnights of admission.   Unresulted Labs (From admission, onward)     Start     Ordered   06/18/23 0500  Comprehensive metabolic panel  Tomorrow morning,   R        06/17/23 1436   06/18/23 0500  CBC  Tomorrow morning,   R        06/17/23 1436   06/18/23 0500  Magnesium  Tomorrow morning,   R        06/17/23 1436   06/18/23 0500  Phosphorus  Tomorrow morning,   R        06/17/23 1436   06/17/23 1427  Hepatitis B surface antibody,quantitative  (New Admission Hemo Labs (Hepatitis B))  ONCE - URGENT,   URGENT        06/17/23 1427            Orders Placed This Encounter  Procedures   DG C-Arm 1-60 Min   DG CHEST PORT 1 VIEW   Glucose, capillary   Glucose, capillary   Glucose, capillary   Hepatitis B surface antibody,quantitative   Hepatitis B surface antigen   Hepatic function panel   TSH   Hemoglobin A1c   Comprehensive metabolic panel   CBC   Magnesium   Phosphorus   Diet renal/carb modified with fluid restriction Diet-HS Snack? Nothing; Fluid  restriction: 1200 mL Fluid; Room service appropriate? Yes with Assist; Fluid consistency: Thin   Pre-admission testing diagnosis   Pre-admission testing diagnosis   Anesthesia Preoperative Order   Informed Consent Details: Physician/Practitioner Attestation; Transcribe to consent form and obtain patient signature   Pre-Hemodialysis Protocol - Day of Dialysis   Post-Dialysis Protocol - Day of Dialysis   Apply Diabetes Mellitus Care Plan  STAT CBG when hypoglycemia is suspected. If treated, recheck every 15 minutes after each treatment until CBG >/= 70 mg/dl   Refer to Hypoglycemia Protocol Sidebar Report for treatment of CBG < 70 mg/dl   No HS correction Insulin    Notify physician (specify)   Initiate Heart Failure Care Plan   Daily weights   Strict intake and output   In and Out Cath   Patient Education:   Apply Heart Failure Care Plan   [facility] and [facility] only) Obtain REDS clips reading Every morning   Cardiac Monitoring Continuous x 24 hours Indications for use: Sub-acute heart failure   Maintain IV access   Vital signs   Notify physician (specify)   Mobility Protocol: No Restrictions RN to initiate protocols based on patient's level of care   Refer to Sidebar Report Refer to ICU, Med-Surg, Progressive, and Step-Down Mobility Protocol Sidebars   Initiate Adult Central Line Maintenance and Catheter Protocol for patients with central line (CVC, PICC, Port, Hemodialysis, Trialysis)   If patient diabetic or glucose greater than 140 notify physician for Sliding Scale Insulin  Orders   Do not place and if present remove PureWick   Initiate Oral Care Protocol   Initiate Carrier Fluid Protocol   RN may order General Admission PRN Orders utilizing "General Admission PRN medications" (through manage orders) for the following patient needs: allergy symptoms (Claritin), cold sores (Carmex), cough (Robitussin DM), eye irritation (Liquifilm Tears), hemorrhoids (Tucks), indigestion (Maalox), minor skin  irritation (Hydrocortisone Cream), muscle pain (Ben Gay), nose irritation (saline nasal spray) and sore throat (Chloraseptic spray).   Home Health   Full code   Inpatient consult to Cardiology Consult Timeframe: ROUTINE - requires response within 24 hours; Reason for Consult? CHF Already called   Consult to Transition of Care Team   Consult to Heart Failure Navigation Team ([facility], [facility], and [facility])   Nutritional services consult   OT eval and treat   PT eval and treat   Oxygen therapy Mode or (Route): Nasal cannula; Keep O2 saturation between: >92%   Pulse oximetry check with vital signs   Oxygen therapy Mode or (Route): Nasal cannula; Liters Per Minute: 2; Keep O2 saturation between: greater than 92 %   Incentive spirometry   I-STAT, chem 8   Insert peripheral IV   Hemodialysis inpatient   Place in observation (patient's expected length of stay will be less than 2 midnights)   Discharge patient Discharge disposition: 01-Home or Self Care; Discharge patient date: 06/17/2023   Aspiration precautions   Fall precautions    Author: Lavanda Porter, MD 12 pm -8 pm. [facility] 06/17/2023 4:35 PM >>Please note for any concern,or critical results after hours past 8pm please contact the [facility] floor coverage provider from 7 PM- 7 AM. For on call review [email or web address], username [email or web address] and PW: your phone number<<

## 2023-06-17 NOTE — Anesthesia Postprocedure Evaluation (Signed)
 Anesthesia Post Note  Patient: Rhonda Clayton  Procedure(s) Performed: INSERTION OF DIALYSIS CATHETER (Right: Neck)     Patient location during evaluation: PACU Anesthesia Type: MAC Level of consciousness: awake and alert Pain management: pain level controlled Vital Signs Assessment: post-procedure vital signs reviewed and stable Respiratory status: spontaneous breathing, nonlabored ventilation, respiratory function stable and patient connected to nasal cannula oxygen Cardiovascular status: stable and blood pressure returned to baseline Postop Assessment: no apparent nausea or vomiting Anesthetic complications: no  No notable events documented.  Last Vitals:  Vitals:   06/17/23 1345 06/17/23 1400  BP: 136/65 137/65  Pulse: 67 68  Resp: 18 (!) 22  Temp:    SpO2: 94% 95%    Last Pain:  Vitals:   06/17/23 1400  TempSrc:   PainSc: 0-No pain                 Melvenia Stabs

## 2023-06-17 NOTE — Consult Note (Signed)
 Renal Service Consult Note [facility]  Rhonda Clayton 06/17/2023 [name], MD Requesting Physician: Dr. [name]  Reason for Consult: CKD 5 patient with shortness of breath HPI: The patient is a 78 y.o. year-old w/ PMH as below who is scheduled for elective access surgery for dialysis.  Patient had tunneled catheter placed but they could not do a permanent access in the arm due to patient's orthopnea, she could not lay flat.  We are asked to see if she needs dialysis.   Pt seen in PACU, patient is pleasant but states that she has been dealing with orthopnea for several days off-and-on maybe longer than that.  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 Irene Mannheim wanted to get access in now prior to these procedures as he suspects that she will end up on dialysis.  She denies any other complaints such as nausea, vomiting, abdominal pain or shortness of breath at rest.   ROS - denies CP, no joint pain, no HA, no blurry vision, no rash, no diarrhea, no nausea/ vomiting  PMH: CKD 5 Diabetes type 2 Hypertension HLD Hypothyroidism Venous insufficiency  Past Surgical History  Past Surgical History:  Procedure Laterality Date   ABDOMINAL EXPOSURE N/A 05/09/2014   Procedure: ABDOMINAL EXPOSURE;  Surgeon: Mayo Speck, MD;  Location: [facility];  Service: Vascular;  Laterality: N/A;   ANTERIOR LAT LUMBAR FUSION Right 05/09/2014   Procedure: Lumbar three-four, Lumbar four-five Anterior lateral lumbar fusion;  Surgeon: Manya Sells, MD;  Location: [facility];  Service: Neurosurgery;  Laterality: Right;   ANTERIOR LUMBAR FUSION N/A 05/09/2014   Procedure: Stage 1:Lumbar four-five, Lumbar five-Sacral one Anterior lumbar interbody fusion with Dr. Deara Douglas for approach ;  Surgeon: Manya Sells, MD;  Location: [facility];  Service: Neurosurgery;  Laterality: N/A;    CHOLECYSTECTOMY  1987   INCONTINENCE SURGERY     LUMBAR PERCUTANEOUS PEDICLE SCREW 3 LEVEL N/A 05/10/2014   Procedure: Stage 2: Percutaneous Pedicle Screws; Laminectomy L3-4  ;  Surgeon: Manya Sells, MD;  Location: [facility];  Service: Neurosurgery;  Laterality: N/A;  Stage 2: Percutaneous Pedicle Screws T10 to Ileum; Laminectomy L3-4     RECTAL SURGERY     VAGINA SURGERY  2018   [place] Fat Touch- Vaginal Dryness   Family History  Family History  Problem Relation Age of Onset   Multiple myeloma Mother    Cancer Maternal Aunt        Cancer in the mouth - dipped snuff   Heart disease Brother    Diabetes Father    Colon polyps Brother    Heart disease Maternal Uncle    Heart disease Paternal Uncle    Heart disease Paternal Grandfather    Social History  reports that she has never smoked. She has never used smokeless tobacco. 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 She reports that she does not drink alcohol . Allergies  Allergies  Allergen Reactions   Coffee Flavoring Agent (Non-Screening) Other (See Comments)    Sick on stomach, sneezing, backed up   Azithromycin  Other (See Comments)   Fluogen [Influenza Virus Vaccine]    Molds & Smuts     Allergy test showed    Pneumococcal Vac Polyvalent Other (See Comments)    Large site reaction    Cephalexin  Other (See Comments)    Yeast infection  Ciprofloxacin Other (See Comments)    tendinitis   Oatmeal Other (See Comments)    Sneezing and drainage   Sulfa Antibiotics Other (See Comments)    Yeast infection   Home medications Prior to Admission medications   Medication Sig Start Date End Date Taking? Authorizing Provider  amLODipine  (NORVASC ) 5 MG tablet Take 1 tablet (5 mg total) by mouth at bedtime. 06/15/23  Yes Sanjuanita Cruz, NP  aspirin EC 81 MG tablet Take 1 tablet (81 mg total) by mouth daily. Swallow whole. 06/16/23  Yes Sanjuanita Cruz, NP  atorvastatin (LIPITOR) 40 MG tablet Take 1 tablet (40 mg  total) by mouth at bedtime. 06/15/23  Yes Johnie Nailer B, NP  furosemide  (LASIX ) 40 MG tablet Take 1 tablet (40 mg total) by mouth See admin instructions. 06/15/23  Yes Johnie Nailer B, NP  hydrALAZINE (APRESOLINE) 25 MG tablet Take 25 mg by mouth 3 (three) times daily. 02/22/23  Yes [provider]  levothyroxine  (SYNTHROID ) 100 MCG tablet Take 100 mcg by mouth See admin instructions. Takes 1 tablet daily except for Monday and Friday she takes 0.5 tablet   Yes [provider]  NOVOLOG  MIX 70/30 FLEXPEN (70-30) 100 UNIT/ML FlexPen Inject into the skin in the morning and at bedtime. Takes 15 units in the morning and 9-10 units at bedtime   Yes [provider]  B-D UF III MINI PEN NEEDLES 31G X 5 MM MISC Inject into the skin 2 (two) times daily. 03/01/23   [provider]  betamethasone valerate (VALISONE) 0.1 % cream Apply 1 Application topically as needed (skin condition). 02/23/23   [provider]  Continuous Glucose Sensor (FREESTYLE LIBRE 3 SENSOR) MISC change every 14 days topically to monitor blood glucose continuously 28 days 03/01/23   [provider]  Lancets [facility] DELICA PLUS [place]) MISC Apply topically 3 (three) times daily. 02/22/23   [provider]  nitroGLYCERIN  (NITROSTAT ) 0.4 MG SL tablet Place 1 tablet (0.4 mg total) under the tongue every 5 (five) minutes as needed for chest pain. 06/01/23 08/30/23  Camnitz, Babetta Lesch, MD  [facility] VERIO test strip 3 (three) times daily. 03/03/23   [provider]  pantoprazole  (PROTONIX ) 40 MG tablet Take 1.5 tablets by mouth daily. 06/03/23   [provider]     Vitals:   06/17/23 1145 06/17/23 1336 06/17/23 1337 06/17/23 1345  BP:   136/67 136/65  Pulse:   64 67  Resp:   (!) 23 18  Temp:  97.6 F (36.4 C)    TempSrc:      SpO2: 92%  100% 94%  Weight:      Height:       Exam Gen alert, no distress, 3 L nasal cannula O2 No rash, cyanosis or  gangrene Sclera anicteric, throat clear  No jvd or bruits Chest bibasilar crackles 1/3 up RRR no MRG Abd soft ntnd no mass or ascites +bs GU defer MS no joint effusions or deformity Ext trace LE  edema, no other edema Neuro is alert, Ox 3 , nf    RIJ TDC intact      Renal-related home meds: Norvasc  5 at bedtime Lasix  40 mg as needed Hydralazine 25 mg 3 times daily   Bmet 5/27 -> K+4.2, bun 52, creat 3.11  Istat from 5/29 today -> K +4.0, BUN 54, creatinine 3.5 Chest x-ray: vascular congestion and mild interstitial pulmonary edema BP 137/65, HR 68, RR 22, temp 97   Assessment/ Plan: SOB/  orthopnea: CXR showing pulm edema in this patient w/ advanced CKD stage 5 approaching ESRD. 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  Needs HD today/ tonight for pulm edema.  CKD 5: plan as above, 1st HD today.  HTN: cont home meds as needed SP TDC R chest: per [facility]      Larry Poag  MD [facility] 06/17/2023, 2:02 PM  Recent Labs  Lab 06/14/23 0758 06/15/23 0419 06/17/23 0913  HGB  --  8.1* 10.2*  CALCIUM 8.7* 8.7*  --   CREATININE 3.19* 3.11* 3.50*  K 3.7 4.2 4.0   Inpatient medications:  chlorhexidine   60 mL Topical Once   And   [START ON 06/18/2023] chlorhexidine   60 mL Topical Once    sodium chloride  10 mL/hr at 06/17/23 1223   sodium chloride  flush

## 2023-06-17 NOTE — Op Note (Signed)
    OPERATIVE NOTE  PROCEDURE:   Right internal jugular TDC placement with ultrasound and fluoroscopic guidance  PRE-OPERATIVE DIAGNOSIS: ESRD  POST-OPERATIVE DIAGNOSIS: same as above   SURGEON: Philipp Brawn MD  ASSISTANT(S): none  ANESTHESIA: IV sedation and local  ESTIMATED BLOOD LOSS: minimal  FINDING(S): Widely patent and compressible right IJ with high respiratory variation. Both ports easily flushed and aspirated  Appropriately placed with the tip at the atriocaval junction  SPECIMEN(S): None  INDICATIONS:   Rhonda Clayton is a 78 y.o. female with significant heart failure and stage V kidney disease.  She was recently admitted due to cardiac complications and was noted to likely have a more imminent need of dialysis.  After discussion between myself, nephrology and cardiology elected to cancel the arm access creation and plan for tunneled dialysis catheter placement and permanent arm access can be configured at a later date once her cardiac conditions are optimized.  Risks and benefits of tunneled dialysis catheter placement reviewed, she expressed understanding and elects to proceed.  DESCRIPTION: The patient was brought to the operating room positioned supine on operating room table.  The neck was prepped and draped in the usual sterile fashion.  The patient was sedated reoperative antibiotics were administered and a timeout was performed. Using ultrasound guidance the right internal jugular vein was accessed with micropuncture technique.  Through the micropuncture sheath, the guidewire was advanced into the superior vena cava.  A small incision was made around the skin access point.  The access point was serially dilated under direct fluoroscopic guidance.  A peel-away sheath was introduced into the superior vena cava under fluoroscopic guidance.  A counterincision was made in the chest under the clavicle.  A 19 cm tunnel dialysis catheter was then tunneled under the skin,  over the clavicle into the incision in the neck.  The tunneling device was removed and the catheter fed through the peel-away sheath into the superior vena cava.  The peel-away sheath was removed and the catheter gently pulled back.  Adequate position was confirmed with x-ray.  The catheter was tested and found to aspirate and flush with ease.    The catheter was sutured to the skin and the neck incision was closed with 4-0 Monocryl.  The catheter was then capped and heparin  locked.     COMPLICATIONS: none apparent  CONDITION: stable  Philipp Brawn MD [facility] Phone Number: [phone] 06/17/2023 1:21 PM

## 2023-06-17 NOTE — Progress Notes (Signed)
Heart Failure Navigator Progress Note  Assessed for Heart & Vascular TOC clinic readiness.  Patient does not meet criteria due to ESRD starting hemodialysis.   Navigator will sign off at this time.   Earnestine Leys, BSN, Clinical cytogeneticist Only

## 2023-06-17 NOTE — Transfer of Care (Signed)
 Immediate Anesthesia Transfer of Care Note  Patient: Rhonda Clayton  Procedure(s) Performed: INSERTION OF DIALYSIS CATHETER (Right: Neck)  Patient Location: PACU  Anesthesia Type:MAC  Level of Consciousness: awake, alert , and oriented  Airway & Oxygen Therapy: Patient Spontanous Breathing and Patient connected to T-piece oxygen  Post-op Assessment: Report given to RN and Post -op Vital signs reviewed and stable  Post vital signs: Reviewed and stable  Last Vitals:  Vitals Value Taken Time  BP 136/67 06/17/23 1336  Temp    Pulse 67 06/17/23 1342  Resp 20 06/17/23 1342  SpO2 94 % 06/17/23 1342  Vitals shown include unfiled device data.  Last Pain:  Vitals:   06/17/23 0917  TempSrc:   PainSc: 0-No pain         Complications: No notable events documented.

## 2023-06-18 ENCOUNTER — Encounter (HOSPITAL_COMMUNITY): Payer: Self-pay | Admitting: Vascular Surgery

## 2023-06-18 DIAGNOSIS — N186 End stage renal disease: Secondary | ICD-10-CM | POA: Diagnosis not present

## 2023-06-18 DIAGNOSIS — Z981 Arthrodesis status: Secondary | ICD-10-CM | POA: Diagnosis not present

## 2023-06-18 DIAGNOSIS — Z79890 Hormone replacement therapy (postmenopausal): Secondary | ICD-10-CM | POA: Diagnosis not present

## 2023-06-18 DIAGNOSIS — R0902 Hypoxemia: Secondary | ICD-10-CM | POA: Diagnosis not present

## 2023-06-18 DIAGNOSIS — E785 Hyperlipidemia, unspecified: Secondary | ICD-10-CM | POA: Diagnosis not present

## 2023-06-18 DIAGNOSIS — Z83719 Family history of colon polyps, unspecified: Secondary | ICD-10-CM | POA: Diagnosis not present

## 2023-06-18 DIAGNOSIS — Z992 Dependence on renal dialysis: Secondary | ICD-10-CM | POA: Diagnosis not present

## 2023-06-18 DIAGNOSIS — Z7982 Long term (current) use of aspirin: Secondary | ICD-10-CM | POA: Diagnosis not present

## 2023-06-18 DIAGNOSIS — Z833 Family history of diabetes mellitus: Secondary | ICD-10-CM | POA: Diagnosis not present

## 2023-06-18 DIAGNOSIS — Z8249 Family history of ischemic heart disease and other diseases of the circulatory system: Secondary | ICD-10-CM | POA: Diagnosis not present

## 2023-06-18 DIAGNOSIS — Z881 Allergy status to other antibiotic agents status: Secondary | ICD-10-CM | POA: Diagnosis not present

## 2023-06-18 DIAGNOSIS — E039 Hypothyroidism, unspecified: Secondary | ICD-10-CM | POA: Diagnosis not present

## 2023-06-18 DIAGNOSIS — Z9049 Acquired absence of other specified parts of digestive tract: Secondary | ICD-10-CM | POA: Diagnosis not present

## 2023-06-18 DIAGNOSIS — Z6835 Body mass index (BMI) 35.0-35.9, adult: Secondary | ICD-10-CM | POA: Diagnosis not present

## 2023-06-18 DIAGNOSIS — I350 Nonrheumatic aortic (valve) stenosis: Secondary | ICD-10-CM | POA: Diagnosis not present

## 2023-06-18 DIAGNOSIS — E440 Moderate protein-calorie malnutrition: Secondary | ICD-10-CM | POA: Diagnosis not present

## 2023-06-18 DIAGNOSIS — I5043 Acute on chronic combined systolic (congestive) and diastolic (congestive) heart failure: Secondary | ICD-10-CM | POA: Diagnosis not present

## 2023-06-18 DIAGNOSIS — R0602 Shortness of breath: Secondary | ICD-10-CM | POA: Diagnosis not present

## 2023-06-18 DIAGNOSIS — Z807 Family history of other malignant neoplasms of lymphoid, hematopoietic and related tissues: Secondary | ICD-10-CM | POA: Diagnosis not present

## 2023-06-18 DIAGNOSIS — Z91018 Allergy to other foods: Secondary | ICD-10-CM | POA: Diagnosis not present

## 2023-06-18 DIAGNOSIS — E1139 Type 2 diabetes mellitus with other diabetic ophthalmic complication: Secondary | ICD-10-CM | POA: Diagnosis not present

## 2023-06-18 DIAGNOSIS — E1122 Type 2 diabetes mellitus with diabetic chronic kidney disease: Secondary | ICD-10-CM | POA: Diagnosis not present

## 2023-06-18 DIAGNOSIS — I132 Hypertensive heart and chronic kidney disease with heart failure and with stage 5 chronic kidney disease, or end stage renal disease: Secondary | ICD-10-CM | POA: Diagnosis not present

## 2023-06-18 DIAGNOSIS — Z79899 Other long term (current) drug therapy: Secondary | ICD-10-CM | POA: Diagnosis not present

## 2023-06-18 DIAGNOSIS — I2510 Atherosclerotic heart disease of native coronary artery without angina pectoris: Secondary | ICD-10-CM | POA: Diagnosis not present

## 2023-06-18 LAB — GLUCOSE, CAPILLARY
Glucose-Capillary: 161 mg/dL — ABNORMAL HIGH (ref 70–99)
Glucose-Capillary: 303 mg/dL — ABNORMAL HIGH (ref 70–99)
Glucose-Capillary: 123 mg/dL — ABNORMAL HIGH (ref 70–99)
Glucose-Capillary: 288 mg/dL — ABNORMAL HIGH (ref 70–99)

## 2023-06-18 LAB — COMPREHENSIVE METABOLIC PANEL WITH GFR
ALT: 22 U/L (ref 0–44)
AST: 25 U/L (ref 15–41)
Albumin: 3.3 g/dL — ABNORMAL LOW (ref 3.5–5.0)
Alkaline Phosphatase: 68 U/L (ref 38–126)
Anion gap: 11 (ref 5–15)
BUN: 31 mg/dL — ABNORMAL HIGH (ref 8–23)
CO2: 23 mmol/L (ref 22–32)
Calcium: 8.9 mg/dL (ref 8.9–10.3)
Chloride: 97 mmol/L — ABNORMAL LOW (ref 98–111)
Creatinine, Ser: 2.51 mg/dL — ABNORMAL HIGH (ref 0.44–1.00)
GFR, Estimated: 19 mL/min — ABNORMAL LOW (ref >60)
Glucose, Bld: 157 mg/dL — ABNORMAL HIGH (ref 70–99)
Potassium: 3.7 mmol/L (ref 3.5–5.1)
Sodium: 131 mmol/L — ABNORMAL LOW (ref 135–145)
Total Bilirubin: 0.7 mg/dL (ref 0.0–1.2)
Total Protein: 6.8 g/dL (ref 6.5–8.1)

## 2023-06-18 LAB — CBC
HCT: 25.8 % — ABNORMAL LOW (ref 36.0–46.0)
Hemoglobin: 8.6 g/dL — ABNORMAL LOW (ref 12.0–15.0)
MCH: 28.7 pg (ref 26.0–34.0)
MCHC: 33.3 g/dL (ref 30.0–36.0)
MCV: 86 fL (ref 80.0–100.0)
Platelets: 433 10*3/uL — ABNORMAL HIGH (ref 150–400)
RBC: 3 MIL/uL — ABNORMAL LOW (ref 3.87–5.11)
RDW: 14.3 % (ref 11.5–15.5)
WBC: 6.4 10*3/uL (ref 4.0–10.5)
nRBC: 0 % (ref 0.0–0.2)

## 2023-06-18 LAB — MAGNESIUM: Magnesium: 1.8 mg/dL (ref 1.7–2.4)

## 2023-06-18 LAB — PHOSPHORUS: Phosphorus: 3.7 mg/dL (ref 2.5–4.6)

## 2023-06-18 MED ORDER — CHLORHEXIDINE GLUCONATE CLOTH 2 % EX PADS
6.0000 | MEDICATED_PAD | Freq: Every day | CUTANEOUS | Status: DC
  Administered 2023-06-19 – 2023-06-20 (×2): 6 via TOPICAL

## 2023-06-18 MED ORDER — RENA-VITE PO TABS
1.0000 | ORAL_TABLET | Freq: Every day | ORAL | Status: DC
  Administered 2023-06-18 – 2023-06-19 (×2): 1 via ORAL
  Filled 2023-06-18 (×2): qty 1

## 2023-06-18 MED ORDER — KATE FARMS STANDARD 1.4 EN LIQD: 325.0000 mL | Freq: Every day | ENTERAL | Status: DC

## 2023-06-18 MED ORDER — INSULIN ASPART PROT & ASPART (70-30 MIX) 100 UNIT/ML ~~LOC~~ SUSP
10.0000 [IU] | Freq: Two times a day (BID) | SUBCUTANEOUS | Status: DC
  Administered 2023-06-18 – 2023-06-20 (×4): 10 [IU] via SUBCUTANEOUS
  Filled 2023-06-18: qty 10

## 2023-06-18 MED ORDER — KATE FARMS STANDARD 1.4 PO LIQD
325.0000 mL | Freq: Every day | ORAL | Status: DC
  Administered 2023-06-18: 325 mL via ORAL
  Filled 2023-06-18 (×3): qty 325

## 2023-06-18 NOTE — Plan of Care (Signed)

## 2023-06-18 NOTE — Evaluation (Signed)
 Physical Therapy Evaluation Patient Details Name: Rhonda Clayton MRN: [id number] DOB: 05-21-1945 Today's Date: 06/18/2023  History of Present Illness  Rhonda Clayton is a 78 y.o. female  directly admitted 5/29 after having Right IJ tunneled dialysis catheter placement by vascular. PMH: aortic stenosis, CKD V, diabetes mellitus, hypothyroidism, last discharged on 27 May by cardiology for acute on chronic combined congestive heart failure, aortic stenosis-currently ongoing TAVR discussion, CKD stage IV, CAD, bradycardia, diabetes mellitus type 2, hypertension.  Clinical Impression  Pt admitted with above diagnosis. Independent PTA without any reported falls. Lives alone but has the option to stay with her sister at d/c. SpO2 95% on RA while ambulating, minimal to no dyspnea. Slow and guarded gait, agreeable to try assistive device next visit as she declined with assessment today. Pt currently with functional limitations due to the deficits listed below (see PT Problem List). Pt will benefit from acute skilled PT to increase their independence and safety with mobility to allow discharge.           If plan is discharge home, recommend the following: A little help with walking and/or transfers;A little help with bathing/dressing/bathroom;Assistance with cooking/housework;Assist for transportation;Help with stairs or ramp for entrance   Can travel by private vehicle        Equipment Recommendations Rollator (4 wheels)  Recommendations for Other Services       Functional Status Assessment Patient has had a recent decline in their functional status and demonstrates the ability to make significant improvements in function in a reasonable and predictable amount of time.     Precautions / Restrictions Precautions Precautions: Fall Recall of Precautions/Restrictions: Intact Precaution/Restrictions Comments: monitor O2 Restrictions Weight Bearing Restrictions Per Provider Order: No      Mobility  Bed  Mobility Overal bed mobility: Modified Independent                  Transfers Overall transfer level: Needs assistance Equipment used: None Transfers: Sit to/from Stand Sit to Stand: Supervision           General transfer comment: Declines AD use, able to stand without assist but shows some increased sway. Cues for symptom awareness and safety.    Ambulation/Gait Ambulation/Gait assistance: Contact guard assist Gait Distance (Feet): 100 Feet Assistive device: None Gait Pattern/deviations: Step-through pattern, Decreased stride length Gait velocity: dec Gait velocity interpretation: <1.8 ft/sec, indicate of risk for recurrent falls   General Gait Details: CGA for safety, declined to use RW. Pt fatigued easily but SpO2 maintained 95% on RA. Slow and guarded gait, furniture surfing. After bout, pt agreeable to RW use for safety and efficency. No overt LOB or buckling noted however.  Stairs            Wheelchair Mobility     Tilt Bed    Modified Rankin (Stroke Patients Only)       Balance Overall balance assessment: Needs assistance Sitting-balance support: No upper extremity supported Sitting balance-Leahy Scale: Normal     Standing balance support: No upper extremity supported, During functional activity Standing balance-Leahy Scale: Fair Standing balance comment: supervision statically                             Pertinent Vitals/Pain Pain Assessment Pain Assessment: No/denies pain    Home Living Family/patient expects to be discharged to:: Private residence Living Arrangements: Alone Available Help at Discharge: Family Type of Home: House Home Access: Stairs to enter Entrance Stairs-Rails:  Right;Left Entrance Stairs-Number of Steps: 2   Home Layout: One level Home Equipment: Shower seat - built in;Grab bars - tub/shower;Rolling Environmental consultant (2 wheels)      Prior Function Prior Level of Function : Independent/Modified Independent              Mobility Comments: IND no AD for all ADLs, self care tasks and IADLs ADLs Comments: ind     Extremity/Trunk Assessment   Upper Extremity Assessment Upper Extremity Assessment: Defer to OT evaluation    Lower Extremity Assessment Lower Extremity Assessment: Generalized weakness       Communication   Communication Communication: No apparent difficulties    Cognition Arousal: Alert Behavior During Therapy: WFL for tasks assessed/performed   PT - Cognitive impairments: No apparent impairments                         Following commands: Intact       Cueing Cueing Techniques: Verbal cues     General Comments General comments (skin integrity, edema, etc.): SpO2 97% on 2L at rest, 92% on RA at rest. Ambulating 95% on RA.    Exercises     Assessment/Plan    PT Assessment Patient needs continued PT services  PT Problem List Decreased strength;Decreased activity tolerance;Decreased balance;Decreased mobility;Decreased knowledge of use of DME;Cardiopulmonary status limiting activity       PT Treatment Interventions DME instruction;Gait training;Stair training;Functional mobility training;Therapeutic activities;Therapeutic exercise;Balance training;Neuromuscular re-education;Patient/family education    PT Goals (Current goals can be found in the Care Plan section)  Acute Rehab PT Goals Patient Stated Goal: Get well go home PT Goal Formulation: With patient Time For Goal Achievement: 07/02/23 Potential to Achieve Goals: Good    Frequency Min 2X/week     Co-evaluation               AM-PAC PT "6 Clicks" Mobility  Outcome Measure Help needed turning from your back to your side while in a flat bed without using bedrails?: None Help needed moving from lying on your back to sitting on the side of a flat bed without using bedrails?: None Help needed moving to and from a bed to a chair (including a wheelchair)?: A Little Help needed standing  up from a chair using your arms (e.g., wheelchair or bedside chair)?: A Little Help needed to walk in hospital room?: A Little Help needed climbing 3-5 steps with a railing? : A Little 6 Click Score: 20    End of Session Equipment Utilized During Treatment: Gait belt Activity Tolerance: Patient tolerated treatment well Patient left: in bed;with call bell/phone within reach;with bed alarm set;with family/visitor present;with nursing/sitter in room   PT Visit Diagnosis: Unsteadiness on feet (R26.81);Other abnormalities of gait and mobility (R26.89);Muscle weakness (generalized) (M62.81)    Time: 1610-9604 PT Time Calculation (min) (ACUTE ONLY): 24 min   Charges:   PT Evaluation $PT Eval Low Complexity: 1 Low PT Treatments $Gait Training: 8-22 mins PT General Charges $$ ACUTE PT VISIT: 1 Visit         Jory Ng, PT, DPT [facility]  Rehabilitation Services Physical Therapist Office: [phone] Website: [email or web address]   Alinda Irani 06/18/2023, 10:09 AM

## 2023-06-18 NOTE — Progress Notes (Addendum)
 PROGRESS NOTE    Rhonda Clayton  QMV:[id number] DOB: 12-11-1945 DOA: 06/17/2023 PCP: Avva, Ravisankar, MD   No chief complaint on file.   Brief Narrative:   Rhonda Clayton is a 78 y.o. female with past medical history  of   aortic stenosis, CKD V, diabetes mellitus, hypothyroidism, last discharged on 27 May by cardiology for acute on chronic combined congestive heart failure, aortic stenosis-currently ongoing TAVR discussion, CKD stage IV, CAD, bradycardia, diabetes mellitus type 2, hypertension seen today in PACU as a direct admit after having Right IJ tunneled dialysis catheter placement by vascular MD Dr. [name] for temporary hemodialysis need.  Admission requested to medicine for ongoing management and heart failure and diabetes, patient's been seen by nephrology, and dialyzed overnight.   Assessment & Plan:   Principal Problem:   SOB (shortness of breath)    Acute on chronic combined heart failure Shortness of breath, orthopnea Pulmonary edema -Evidence of volume overload on physical exam and on imaging as chest x-ray significant for moderate edema. -echo 5/26 with LVEF of 35-40%, hypokinesis of the mid-apical, anterior, anterolateral and apical wall, g2DD, moderately dilated LA -Dyspnea in the setting of volume overload, acute on chronic combined CHF, he received IV Lasix  yesterday, but currently oliguric. -She was encouraged to use incentive spirometry. - Volume management with dialysis, recent hospitalization for IV diuresis for which she has been -6 pounds.  Chronic kidney stage V, now new ESRD -Input greatly appreciated, plan for hemodialysis again tomorrow, she will need to be clipped before discharge -Has [facility] cath in place -Discussed with vascular surgery regarding her access, for now [facility] remains appropriate, no plan for AV fistula or graft especially for anticipated cardiac procedures   Aortic stenosis -- echo 5/26 with severe low flow low gradient aortic stenosis,  AVA 0.89, mean gradient -- has follow up arranged outpatient for ongoing TAVR discussions with Dr. Lorie Rook, scheduled for 6/23   Bradycardia  -- following with EP, Dr. Arlester Ladd with plans for leadless PPM on 6/19 -- No bradycardia while inpatient -Sinew to monitor on telemetry -Have discussed with EP upon family's request, they would like to avoid pacemaker insertion during hospital stay due to high risk of infection, so this will be done as an outpatient on her previously scheduled appointment   CAD --recent outpatient high risk PET CT in the LAD territory. - on ASA, statin   Hypertension -continue norvasc  5mg  daily, hydralazine 25mg  TID, likely with further dialysis blood pressure will be more soft than we will start cutting down on these meds   DM -He is currently on insulin  sliding scale, will resume 70/30 insulin  at a lower dose 10 units twice daily  Hypothyroidism - cont with Synthroid    DVT prophylaxis: (Lovenox/Heparin /SCD's/anticoagulated/None (if comfort care) Code Status: (Full/Partial - specify details) Family Communication: (Specify name, relationship & date discussed. NO "discussed with patient") Disposition:   Status is: Clear will need to be transition to inpatient given ongoing need for dialysis and need to be clipped before she stable for discharge    Consultants:  Nephrology  discussed with vascular surgery Dr. [name] and EP cardiology    Subjective:  Reports she is feeling better today, dyspnea has improved  Objective: Vitals:   06/18/23 0722 06/18/23 0723 06/18/23 0800 06/18/23 1213  BP:  128/60 (!) 125/48 (!) 130/57  Pulse:  71 69 71  Resp:  12 13 (!) 22  Temp: 98.1 F (36.7 C) 98.1 F (36.7 C)  98.9 F (37.2 C)  TempSrc: Oral Oral  Oral  SpO2:  96% 97% 91%  Weight:      Height:        Intake/Output Summary (Last 24 hours) at 06/18/2023 1401 Last data filed at 06/18/2023 0650 Gross per 24 hour  Intake 720 ml  Output 2850 ml  Net  -2130 ml   Filed Weights   06/17/23 0902 06/17/23 1518 06/17/23 2200  Weight: 84.4 kg 83.9 kg 82.5 kg    Examination:  Awake Alert, Oriented X 3, No new F.N deficits, Normal affect Symmetrical Chest wall movement, entry bilaterally RRR,No Gallops,Rubs, significant systolic murmur +ve B.Sounds, Abd Soft, No tenderness, No rebound - guarding or rigidity. No Cyanosis, Clubbing or edema, No new Rash or bruise       Data Reviewed: I have personally reviewed following labs and imaging studies  CBC: Recent Labs  Lab 06/12/23 1856 06/13/23 0804 06/14/23 0437 06/15/23 0419 06/17/23 0913 06/18/23 0604  WBC 9.3 6.7 6.2 7.0  --  6.4  HGB 9.3* 8.6* 8.2* 8.1* 10.2* 8.6*  HCT 29.3* 26.4* 25.0* 25.0* 30.0* 25.8*  MCV 88.0 85.7 85.9 86.5  --  86.0  PLT 434* 446* 415* 425*  --  433*    Basic Metabolic Panel: Recent Labs  Lab 06/12/23 1856 06/13/23 0804 06/14/23 0758 06/15/23 0419 06/17/23 0913 06/18/23 0604  NA 127* 131* 130* 131* 129* 131*  K 3.2* 3.4* 3.7 4.2 4.0 3.7  CL 96* 98 100 100 102 97*  CO2 18* 19* 21* 21*  --  23  GLUCOSE 141* 215* 193* 133* 201* 157*  BUN 55* 50* 51* 52* 54* 31*  CREATININE 3.20* 3.19* 3.19* 3.11* 3.50* 2.51*  CALCIUM 8.9 8.7* 8.7* 8.7*  --  8.9  MG  --   --   --   --   --  1.8  PHOS  --   --   --   --   --  3.7    GFR: Estimated Creatinine Clearance: 17.4 mL/min (A) (by C-G formula based on SCr of 2.51 mg/dL (H)).  Liver Function Tests: Recent Labs  Lab 06/17/23 1453 06/18/23 0604  AST 28 25  ALT 22 22  ALKPHOS 80 68  BILITOT 1.0 0.7  PROT 7.3 6.8  ALBUMIN 3.6 3.3*    CBG: Recent Labs  Lab 06/17/23 1112 06/17/23 1337 06/17/23 2021 06/18/23 0726 06/18/23 1206  GLUCAP 218* 191* 170* 161* 303*     Recent Results (from the past 240 hours)  Resp panel by RT-PCR (RSV, Flu A&B, Covid) Anterior Nasal Swab     Status: None   Collection Time: 06/12/23  7:09 PM   Specimen: Anterior Nasal Swab  Result Value Ref Range Status    SARS Coronavirus 2 by RT PCR NEGATIVE NEGATIVE Final    Comment: (NOTE) SARS-CoV-2 target nucleic acids are NOT DETECTED.  The SARS-CoV-2 RNA is generally detectable in upper respiratory specimens during the acute phase of infection. The lowest concentration of SARS-CoV-2 viral copies this assay can detect is 138 copies/mL. A negative result does not preclude SARS-Cov-2 infection and should not be used as the sole basis for treatment or other patient management decisions. A negative result may occur with  improper specimen collection/handling, submission of specimen other than nasopharyngeal swab, presence of viral mutation(s) within the areas targeted by this assay, and inadequate number of viral copies(<138 copies/mL). A negative result must be combined with clinical observations, patient history, and epidemiological information. The expected result is Negative.  Fact Sheet for  Patients:  BloggerCourse.com  Fact Sheet for Healthcare Providers:  SeriousBroker.it  This test is no t yet approved or cleared by the [place]  FDA and  has been authorized for detection and/or diagnosis of SARS-CoV-2 by FDA under an Emergency Use Authorization (EUA). This EUA will remain  in effect (meaning this test can be used) for the duration of the COVID-19 declaration under Section 564(b)(1) of the Act, 21 U.S.C.section 360bbb-3(b)(1), unless the authorization is terminated  or revoked sooner.       Influenza A by PCR NEGATIVE NEGATIVE Final   Influenza B by PCR NEGATIVE NEGATIVE Final    Comment: (NOTE) The Xpert Xpress SARS-CoV-2/FLU/RSV plus assay is intended as an aid in the diagnosis of influenza from Nasopharyngeal swab specimens and should not be used as a sole basis for treatment. Nasal washings and aspirates are unacceptable for Xpert Xpress SARS-CoV-2/FLU/RSV testing.  Fact Sheet for  Patients: BloggerCourse.com  Fact Sheet for Healthcare Providers: SeriousBroker.it  This test is not yet approved or cleared by the [place]  FDA and has been authorized for detection and/or diagnosis of SARS-CoV-2 by FDA under an Emergency Use Authorization (EUA). This EUA will remain in effect (meaning this test can be used) for the duration of the COVID-19 declaration under Section 564(b)(1) of the Act, 21 U.S.C. section 360bbb-3(b)(1), unless the authorization is terminated or revoked.     Resp Syncytial Virus by PCR NEGATIVE NEGATIVE Final    Comment: (NOTE) Fact Sheet for Patients: BloggerCourse.com  Fact Sheet for Healthcare Providers: SeriousBroker.it  This test is not yet approved or cleared by the [place]  FDA and has been authorized for detection and/or diagnosis of SARS-CoV-2 by FDA under an Emergency Use Authorization (EUA). This EUA will remain in effect (meaning this test can be used) for the duration of the COVID-19 declaration under Section 564(b)(1) of the Act, 21 U.S.C. section 360bbb-3(b)(1), unless the authorization is terminated or revoked.  Performed at [facility], [address], [place], [place] [address]          Radiology Studies: DG C-Arm 1-60 Min Result Date: 06/17/2023 CLINICAL DATA:  Dialysis catheter EXAM: DG C-ARM 1-60 MIN FLUOROSCOPY: Fluoroscopy Time:  2.3 seconds Radiation Exposure Index (if provided by the fluoroscopic device): 1.29 mGy Number of Acquired Spot Images: 3 COMPARISON:  Chest x-ray 06/12/2023 FINDINGS: Three low resolution intraoperative spot views of the right chest demonstrate a right-sided central venous catheter with tip at the right atrium IMPRESSION: Intraoperative fluoroscopic assistance provided during right dialysis catheter placement Electronically Signed   By: Esmeralda Hedge M.D.   On:  06/17/2023 17:07   DG CHEST PORT 1 VIEW Result Date: 06/17/2023 CLINICAL DATA:  End-stage renal disease EXAM: PORTABLE CHEST 1 VIEW COMPARISON:  06/12/2023 FINDINGS: New right-sided central venous catheter with tip at the cavoatrial junction. Stable cardiomediastinal silhouette with vascular congestion and pulmonary edema. Probable small pleural effusions. Aortic atherosclerosis. Posterior thoracolumbar region spinal hardware IMPRESSION: 1. New right-sided central venous catheter with tip at the cavoatrial junction. No pneumothorax 2. Vascular congestion and pulmonary edema with probable small pleural effusions. Electronically Signed   By: Esmeralda Hedge M.D.   On: 06/17/2023 15:51        Scheduled Meds:  amLODipine   5 mg Oral QHS   atorvastatin  40 mg Oral QHS   Chlorhexidine  Gluconate Cloth  6 each Topical Q0600   [START ON 06/19/2023] Chlorhexidine  Gluconate Cloth  6 each Topical Q0600   heparin   5,000 Units Subcutaneous Q8H  hydrALAZINE  25 mg Oral TID   insulin  aspart  0-15 Units Subcutaneous TID WC   [START ON 06/19/2023] levothyroxine   100 mcg Oral Once per day on Sunday Tuesday Wednesday Thursday Saturday   levothyroxine   50 mcg Oral Once per day on Monday Friday   pantoprazole   60 mg Oral Daily   sodium chloride  flush  3 mL Intravenous Q12H   Continuous Infusions:   LOS: 0 days        Seena Dadds, MD [facility]   To contact the attending provider between 7A-7P or the covering provider during after hours 7P-7A, please log into the web site [email or web address] and access using universal [place] password for that web site. If you do not have the password, please call the hospital operator.  06/18/2023, 2:01 PM

## 2023-06-18 NOTE — Progress Notes (Addendum)
 [facility] Progress Note  Subjective:  Seen in room No longer SOB lying flat 2 L UF w/ HD yest  Vitals:   06/18/23 0722 06/18/23 0723 06/18/23 0800 06/18/23 1213  BP:  128/60 (!) 125/48 (!) 130/57  Pulse:  71 69 71  Resp:  12 13 (!) 22  Temp: 98.1 F (36.7 C) 98.1 F (36.7 C)  98.9 F (37.2 C)  TempSrc: Oral Oral  Oral  SpO2:  96% 97% 91%  Weight:      Height:        Exam: Gen alert, no distress, 2 L Ruch O2 No jvd or bruits Chest CTA today RRR no MRG Abd soft ntnd no mass or ascites +bs Ext no LE edema Neuro is alert, Ox 3 , nf    RIJ TDC intact    Renal-related home meds: Norvasc  5 at bedtime Lasix  40 mg as needed Hydralazine 25 mg 3 times daily   ESRD: new esrd pt, needs CLIP    Assessment/ Plan: SOB/ orthopnea: CXR showing pulm edema in this patient w/ advanced CKD stage 5, also pt was having severe orthopnea at home + DOE. Pt is now ESRD, had 1st HD yesterday. Plan next HD tomorrow SOB/ vol overload: looks better today, can lie down flat per the patient. Cont to lower vol w/ HD tomorrow.    CKD 5/ new ESRD: 2nd HD tomorrow. Has TDC in place.  HTN: on home norvasc  / hydralazine, continue SP TDC R chest: per VVS     Larry Poag MD  [facility] 06/18/2023, 12:45 PM  Recent Labs  Lab 06/15/23 0419 06/17/23 0913 06/17/23 1453 06/18/23 0604  HGB 8.1* 10.2*  --  8.6*  ALBUMIN  --   --  3.6 3.3*  CALCIUM 8.7*  --   --  8.9  PHOS  --   --   --  3.7  CREATININE 3.11* 3.50*  --  2.51*  K 4.2 4.0  --  3.7   No results for input(s): "IRON ", "TIBC", "FERRITIN" in the last 168 hours. Inpatient medications:  amLODipine   5 mg Oral QHS   atorvastatin  40 mg Oral QHS   Chlorhexidine  Gluconate Cloth  6 each Topical Q0600   heparin   5,000 Units Subcutaneous Q8H   hydrALAZINE  25 mg Oral TID   insulin  aspart  0-15 Units Subcutaneous TID WC   [START ON 06/19/2023] levothyroxine   100 mcg Oral Once per day on Sunday Tuesday Wednesday Thursday Saturday    levothyroxine   50 mcg Oral Once per day on Monday Friday   pantoprazole   60 mg Oral Daily   sodium chloride  flush  3 mL Intravenous Q12H    acetaminophen  **OR** acetaminophen , hydrALAZINE, nitroGLYCERIN 

## 2023-06-18 NOTE — Care Management Obs Status (Signed)
 MEDICARE OBSERVATION STATUS NOTIFICATION   Patient Details  Name: Rhonda Clayton MRN: [id number] Date of Birth: [birth year]   Medicare Observation Status Notification Given:  Yes  [place]oon/Obs letter signed and copy given  Wynonia Hedges 06/18/2023, 3:11 PM

## 2023-06-18 NOTE — Progress Notes (Addendum)
 Attempted to meet with pt at bedside this morning to discuss out-pt HD needs at d/c. Pt being seen by therapist. Will f/u with pt later this morning.   Lauraine Polite Renal Navigator [phone]  Addendum at 10:21 am: Met with pt and pt's son at bedside. Pt's daughter and pt's sister via phone as well. Introduced self and explained role. Discussed out-pt HD options. Pt prefers TCU at [facility] for general education on HD. Pt's sister/family plan to assist with transportation to/from HD appts and pt agreeable to 4x's a week tx at TCU. Referral submitted to [facility] admissions for review.

## 2023-06-18 NOTE — Progress Notes (Signed)
 Pt has been accepted at TCU at [facility] on M,T,Th, Fr with 11:45 am chair time. Pt can start on Monday and will need to arrive at 11:15 am to complete paperwork prior to treatment. Met with pt and pt's son at bedside. Discussed out-pt HD arrangements and pt agreeable to plans. Schedule letter provided as well. Update provided to attending, nephrologist, and RN CM. Contacted renal PA to request that orders be sent to clinic at d/c. Arrangements added to AVS. Pt requesting resources for transportation and voicing concerns of being OBS level of care. RN CM made aware of those needs as well. Will assist as needed.   Lauraine Polite Renal Navigator [phone]

## 2023-06-18 NOTE — Progress Notes (Signed)
 OT Cancellation Note  Patient Details Name: Rhonda Clayton MRN: [id number] DOB: 12-04-1945   Cancelled Treatment:    Reason Eval/Treat Not Completed: Fatigue/lethargy limiting ability to participate (attempted to arouse pt twice for therapy. Pt was not able to sustain attention and drifting back to sleep. OT will follow-up with pt as able)  06/18/2023  AB, OTR/L  Acute Rehabilitation Services  Office: [phone]   Jorene New 06/18/2023, 11:21 AM

## 2023-06-18 NOTE — Care Management (Signed)
 Transition of Care [facility]) - Inpatient Brief Assessment   Patient Details  Name: Rhonda Clayton MRN: [id number] Date of Birth: 13-Nov-1945  Transition of Care [facility]) CM/SW Contact:    Ronni Colace, RN Phone Number: 06/18/2023, 1:40 PM   Clinical Narrative: Met with patient at the bedside. Just getting through with therapy Discussed DME, recommended rolator. The patient just received a walker with 5 inch wheels last admission. Discussed online shopping for one versus company, insurance though will only pay once every 5 years for walker. She says she will look online. Is active with [employer], order in for [facility] NO further needs identitied  Transition of Care Asessment: Insurance and Status: Insurance coverage has been reviewed Patient has primary care physician: Yes Home environment has been reviewed: [facility] Prior level of function:: Independent Prior/Current Home Services: Current home services (Active with [facility]) Social Drivers of Health Review: SDOH reviewed no interventions necessary Readmission risk has been reviewed: Yes Transition of care needs: transition of care needs identified, TOC will continue to follow

## 2023-06-18 NOTE — Progress Notes (Addendum)
 Initial Nutrition Assessment  DOCUMENTATION CODES:   Non-severe (moderate) malnutrition in context of chronic illness  INTERVENTION:  Add [employer] 1.4 PO daily, each supplement provides 455 kcal and 20 grams protein.  Add renal MVI w/ minerals Discussed renal and diabetes diets and strategies to meet needs of both  NUTRITION DIAGNOSIS:  Moderate Malnutrition related to chronic illness (ESRD, T2DM) as evidenced by mild fat depletion, moderate muscle depletion.  GOAL:  Patient will meet greater than or equal to 90% of their needs  MONITOR:  PO intake, Supplement acceptance, Labs  REASON FOR ASSESSMENT:  Consult Assessment of nutrition requirement/status  ASSESSMENT:  Pt with PMH significant for: aortic stenosis, CKD V, T2DM, hypothyroidism. Direct admit from PACU s/p RIJ tunneled dialysis catheter placement for management of heart failure and diabetes. She is to begin dialysis.  5/26 Echo: 35-40% 5/29 admitted, first HD tx 5/31 2nd dialysis tx scheduled  Pt has now transitioned into ESRD and needs placement at OP dialysis facility prior to discharge.. First dialysis tx was yesterday with next one scheduled for tomorrow. Reports improvement in symptoms. She is pursuing placement at transitional care unit [facility]) to receive txs 4 times per week and receive dialysis education while doing so.   Average Meal Intake No documentation to review  She endorses reduced appetite prior to admission 2/2 fluid overload and not feeling hungry. No issues with chewing or swallowing at baseline. Bowels stable.  24 Hour Recall B: rice cake or piece of bread w/ fruit and coffee L: snack items like fruit and coconut yogurt OR half a sandwich D: protein, starch, veg (usually includes a salad)OR pinto beans and greens Snacks: fruit and coconut yogurt  Discussed renal diet with patient and her son and what to expect at outpatient dialysis as it relates to nutrition education and diet modification.  She has questions about cheese, chocolate and dairy. Guidance given and redirected back to outpatient dietitian who will be able to tailor diet recommendations to patient's specific health history and lab trends.   Admit/Current Weight: 82.5kg  Reports she has intentionally lost 20lbs since November 2024. This is 10% in last six months and considered clinically significant for the time frame reviewed. However, given that the weight loss has been achieved in the presence of adequate intake, this will not be considered in regard to eligibility criteria for being malnourished.   Some non-pitting edema on exam. Has improved significantly, per patient and son report. They both note they didn't even notice she was swelling until the fluid had been removed. Report that her ankles have never been as defined as they are currently. Will likely show some additional weight loss as dialysis txs progress and more fluid is systematically removed. Report UOP has decreased. Furosemide  discontinued today.  Drains/Lines: RIJ: Tunneled hemodialysis catheter (placed 5/29) UOP: x24 hours  Has CGM. Per outpatient RD note, she does not know how to use this device to its full capacity.   Meds: SSI 0-15 TID, Novolog  70/0 10 BID, levothyroxine , renal MVI, pantoprazole   Labs:  Na+ 131>129>131 (L) CBGs 157-201 x24 hours A1c 6.5 (52025)  NUTRITION - FOCUSED PHYSICAL EXAM:  Flowsheet Row Most Recent Value  Orbital Region Mild depletion  Upper Arm Region Mild depletion  Thoracic and Lumbar Region No depletion  Buccal Region No depletion  Temple Region Mild depletion  Clavicle Bone Region Moderate depletion  Clavicle and Acromion Bone Region Moderate depletion  Scapular Bone Region Moderate depletion  Dorsal Hand No depletion  Patellar  Region Mild depletion  Anterior Thigh Region Moderate depletion  Posterior Calf Region Moderate depletion  Edema (RD Assessment) Mild  Hair Reviewed  Eyes Reviewed  Mouth  Reviewed  Skin Reviewed  Nails Reviewed    Diet Order:   Diet Order             Diet renal/carb modified with fluid restriction Diet-HS Snack? Nothing; Fluid restriction: 1200 mL Fluid; Room service appropriate? Yes with Assist; Fluid consistency: Thin  Diet effective now             EDUCATION NEEDS:   Education needs have been addressed  Skin:  Skin Assessment: Reviewed RN Assessment  Last BM:  PTA  Height:  Ht Readings from Last 1 Encounters:  06/17/23 5\' 1"  (1.549 m)   Weight:  Wt Readings from Last 1 Encounters:  06/17/23 82.5 kg    Ideal Body Weight:  47.7 kg  BMI:  Body mass index is 34.37 kg/m.  Estimated Nutritional Needs:   Kcal:  1600-1800kcals  Protein:  95-110g  Fluid:  1L + UOP  Con Decant MS, RD, LDN Registered Dietitian Clinical Nutrition RD Inpatient Contact Info in Amion

## 2023-06-18 NOTE — Evaluation (Signed)
 Occupational Therapy Evaluation Patient Details Name: Rhonda Clayton MRN: [id number] DOB: 16-Nov-1945 Today's Date: 06/18/2023   History of Present Illness   Rhonda Clayton is a 78 y.o. female  directly admitted 5/29 after having Right IJ tunneled dialysis catheter placement by vascular. PMH: aortic stenosis, CKD V, diabetes mellitus, hypothyroidism, last discharged on 27 May by cardiology for acute on chronic combined congestive heart failure, aortic stenosis-currently ongoing TAVR discussion, CKD stage IV, CAD, bradycardia, diabetes mellitus type 2, hypertension.     Clinical Impressions Pt admitted for above, PTA pt lived alone and was ind with ADLs/iADLs. Pt currently presenting with BLE weakness and decreased activity tolerance from her reported baseline. Pt ambulated 9ft in hallway before needing to return to bed following BLE fatiguing, her Sp02 remained stable with activity. With RW pt ambulated CGA, completing ADLS with CGA to setup A at this time. OT to continue following pt acutely to address listed deficits and help transition to next level of care. No post acute OT recommended.      If plan is discharge home, recommend the following:   Assistance with cooking/housework;A little help with walking and/or transfers     Functional Status Assessment   Patient has had a recent decline in their functional status and demonstrates the ability to make significant improvements in function in a reasonable and predictable amount of time.     Equipment Recommendations   None recommended by OT     Recommendations for Other Services         Precautions/Restrictions   Precautions Precautions: Fall Recall of Precautions/Restrictions: Intact Precaution/Restrictions Comments: monitor O2 Restrictions Weight Bearing Restrictions Per Provider Order: No     Mobility Bed Mobility               General bed mobility comments: Pt EOB on arrival    Transfers Overall transfer  level: Needs assistance Equipment used: Rolling walker (2 wheels) Transfers: Sit to/from Stand Sit to Stand: Supervision                  Balance Overall balance assessment: Needs assistance Sitting-balance support: No upper extremity supported Sitting balance-Leahy Scale: Normal     Standing balance support: No upper extremity supported, During functional activity Standing balance-Leahy Scale: Fair Standing balance comment: supervision statically                           ADL either performed or assessed with clinical judgement   ADL Overall ADL's : Needs assistance/impaired Eating/Feeding: Independent;Sitting   Grooming: Standing;Contact guard assist   Upper Body Bathing: Sitting;Set up   Lower Body Bathing: Set up;Sitting/lateral leans   Upper Body Dressing : Set up;Sitting   Lower Body Dressing: Set up;Sitting/lateral leans Lower Body Dressing Details (indicate cue type and reason): doff/don bilat socks Toilet Transfer: Contact guard assist;Ambulation;Rolling walker (2 wheels)   Toileting- Clothing Manipulation and Hygiene: Contact guard assist;Sit to/from stand       Functional mobility during ADLs: Contact guard assist;Rolling walker (2 wheels)       Vision Baseline Vision/History: 0 No visual deficits Patient Visual Report: No change from baseline Vision Assessment?: No apparent visual deficits     Perception Perception: Within Functional Limits       Praxis Praxis: WFL       Pertinent Vitals/Pain Pain Assessment Pain Assessment: No/denies pain     Extremity/Trunk Assessment Upper Extremity Assessment Upper Extremity Assessment: Overall WFL for tasks assessed   Lower  Extremity Assessment Lower Extremity Assessment: Generalized weakness   Cervical / Trunk Assessment Cervical / Trunk Assessment: Kyphotic   Communication Communication Communication: No apparent difficulties   Cognition Arousal: Alert Behavior During  Therapy: WFL for tasks assessed/performed Cognition: No apparent impairments                               Following commands: Intact       Cueing  General Comments   Cueing Techniques: Verbal cues  Sp02 >93% on RA with ambulation, per pt son she would desat to 84-86% while sleeping. RN aware of son's statements.   Exercises     Shoulder Instructions      Home Living Family/patient expects to be discharged to:: Private residence Living Arrangements: Alone Available Help at Discharge: Family Type of Home: House Home Access: Stairs to enter Secretary/administrator of Steps: 2 Entrance Stairs-Rails: Right;Left Home Layout: One level     Bathroom Shower/Tub: Producer, television/film/video: Standard Bathroom Accessibility: No   Home Equipment: Shower seat - built in;Grab bars - Chartered loss adjuster (2 wheels)          Prior Functioning/Environment Prior Level of Function : Independent/Modified Independent             Mobility Comments: IND no AD for all ADLs, self care tasks and IADLs ADLs Comments: ind    OT Problem List: Decreased activity tolerance;Decreased strength   OT Treatment/Interventions: Self-care/ADL training;Cognitive remediation/compensation;DME and/or AE instruction;Balance training;Therapeutic activities;Therapeutic exercise      OT Goals(Current goals can be found in the care plan section)   Acute Rehab OT Goals Patient Stated Goal: to return home, get better food OT Goal Formulation: With patient Time For Goal Achievement: 07/02/23 Potential to Achieve Goals: Good ADL Goals Pt Will Perform Grooming: with modified independence;standing Pt Will Perform Lower Body Bathing: with modified independence;sitting/lateral leans Pt Will Perform Lower Body Dressing: with modified independence;sit to/from stand Pt Will Transfer to Toilet: with modified independence;ambulating   OT Frequency:  Min 2X/week    Co-evaluation               AM-PAC OT "6 Clicks" Daily Activity     Outcome Measure Help from another person eating meals?: None Help from another person taking care of personal grooming?: A Little Help from another person toileting, which includes using toliet, bedpan, or urinal?: A Little Help from another person bathing (including washing, rinsing, drying)?: A Little Help from another person to put on and taking off regular upper body clothing?: A Little Help from another person to put on and taking off regular lower body clothing?: A Little 6 Click Score: 19   End of Session Equipment Utilized During Treatment: Gait belt;Rolling walker (2 wheels) Nurse Communication: Mobility status  Activity Tolerance: Patient tolerated treatment well Patient left: in bed;with call bell/phone within reach;with family/visitor present  OT Visit Diagnosis: Muscle weakness (generalized) (M62.81)                Time: 1610-9604 OT Time Calculation (min): 24 min Charges:  OT General Charges $OT Visit: 1 Visit OT Evaluation $OT Eval Low Complexity: 1 Low OT Treatments $Therapeutic Activity: 8-22 mins  06/18/2023  AB, OTR/L  Acute Rehabilitation Services  Office: [phone]   Jorene New 06/18/2023, 1:29 PM

## 2023-06-19 DIAGNOSIS — R0602 Shortness of breath: Secondary | ICD-10-CM | POA: Diagnosis not present

## 2023-06-19 DIAGNOSIS — E440 Moderate protein-calorie malnutrition: Secondary | ICD-10-CM | POA: Insufficient documentation

## 2023-06-19 LAB — RENAL FUNCTION PANEL
Albumin: 3.4 g/dL — ABNORMAL LOW (ref 3.5–5.0)
Anion gap: 15 (ref 5–15)
BUN: 49 mg/dL — ABNORMAL HIGH (ref 8–23)
CO2: 22 mmol/L (ref 22–32)
Calcium: 9.1 mg/dL (ref 8.9–10.3)
Chloride: 95 mmol/L — ABNORMAL LOW (ref 98–111)
Creatinine, Ser: 3.36 mg/dL — ABNORMAL HIGH (ref 0.44–1.00)
GFR, Estimated: 13 mL/min — ABNORMAL LOW (ref >60)
Glucose, Bld: 277 mg/dL — ABNORMAL HIGH (ref 70–99)
Phosphorus: 4.1 mg/dL (ref 2.5–4.6)
Potassium: 4.2 mmol/L (ref 3.5–5.1)
Sodium: 132 mmol/L — ABNORMAL LOW (ref 135–145)

## 2023-06-19 LAB — GLUCOSE, CAPILLARY
Glucose-Capillary: 173 mg/dL — ABNORMAL HIGH (ref 70–99)
Glucose-Capillary: 221 mg/dL — ABNORMAL HIGH (ref 70–99)
Glucose-Capillary: 205 mg/dL — ABNORMAL HIGH (ref 70–99)

## 2023-06-19 LAB — CBC
HCT: 27.5 % — ABNORMAL LOW (ref 36.0–46.0)
Hemoglobin: 8.9 g/dL — ABNORMAL LOW (ref 12.0–15.0)
MCH: 28 pg (ref 26.0–34.0)
MCHC: 32.4 g/dL (ref 30.0–36.0)
MCV: 86.5 fL (ref 80.0–100.0)
Platelets: 400 10*3/uL (ref 150–400)
RBC: 3.18 MIL/uL — ABNORMAL LOW (ref 3.87–5.11)
RDW: 14.3 % (ref 11.5–15.5)
WBC: 8.1 10*3/uL (ref 4.0–10.5)
nRBC: 0 % (ref 0.0–0.2)

## 2023-06-19 LAB — HEPATITIS B SURFACE ANTIBODY, QUANTITATIVE: Hep B S AB Quant (Post): <3.5 m[IU]/mL — ABNORMAL LOW

## 2023-06-19 MED ORDER — ANTICOAGULANT SODIUM CITRATE 4% (200MG/5ML) IV SOLN: 5.0000 mL | Status: DC | PRN

## 2023-06-19 MED ORDER — NEPRO/CARBSTEADY PO LIQD: 237.0000 mL | ORAL | Status: DC | PRN

## 2023-06-19 MED ORDER — ALTEPLASE 2 MG IJ SOLR: 2.0000 mg | Freq: Once | INTRAMUSCULAR | Status: DC | PRN

## 2023-06-19 MED ORDER — LIDOCAINE HCL (PF) 1 % IJ SOLN: 5.0000 mL | INTRAMUSCULAR | Status: DC | PRN

## 2023-06-19 MED ORDER — PENTAFLUOROPROP-TETRAFLUOROETH EX AERO: 1.0000 | INHALATION_SPRAY | CUTANEOUS | Status: DC | PRN

## 2023-06-19 MED ORDER — HEPARIN SODIUM (PORCINE) 1000 UNIT/ML IJ SOLN
INTRAMUSCULAR | Status: AC
  Filled 2023-06-19: qty 9

## 2023-06-19 MED ORDER — HEPARIN SODIUM (PORCINE) 1000 UNIT/ML DIALYSIS: 2500.0000 [IU] | INTRAMUSCULAR | Status: DC | PRN

## 2023-06-19 MED ORDER — LIDOCAINE-PRILOCAINE 2.5-2.5 % EX CREA: 1.0000 | TOPICAL_CREAM | CUTANEOUS | Status: DC | PRN

## 2023-06-19 MED ORDER — HEPARIN SODIUM (PORCINE) 1000 UNIT/ML DIALYSIS
1000.0000 [IU] | INTRAMUSCULAR | Status: DC | PRN
  Administered 2023-06-19: 3200 [IU]
  Filled 2023-06-19: qty 1

## 2023-06-19 MED ORDER — HEPARIN SODIUM (PORCINE) 1000 UNIT/ML DIALYSIS
2500.0000 [IU] | Freq: Once | INTRAMUSCULAR | Status: AC
  Administered 2023-06-19: 2500 [IU] via INTRAVENOUS_CENTRAL

## 2023-06-19 NOTE — Plan of Care (Signed)
  Problem: Education: Goal: Knowledge of General Education information will improve Description: Including pain rating scale, medication(s)/side effects and non-pharmacologic comfort measures Outcome: Progressing   Problem: Activity: Goal: Risk for activity intolerance will decrease Outcome: Progressing   Problem: Nutrition: Goal: Adequate nutrition will be maintained Outcome: Progressing   Problem: Coping: Goal: Level of anxiety will decrease Outcome: Progressing   Problem: Elimination: Goal: Will not experience complications related to bowel motility Outcome: Progressing   Problem: Pain Managment: Goal: General experience of comfort will improve and/or be controlled Outcome: Progressing

## 2023-06-19 NOTE — Progress Notes (Signed)
 PROGRESS NOTE    Rhonda Clayton  OZH:[id number] DOB: December 22, 1945 DOA: 06/17/2023 PCP: Avva, Ravisankar, MD   No chief complaint on file.   Brief Narrative:   Rhonda Clayton is a 78 y.o. female with past medical history  of   aortic stenosis, CKD V, diabetes mellitus, hypothyroidism, last discharged on 27 May by cardiology for acute on chronic combined congestive heart failure, aortic stenosis-currently ongoing TAVR discussion, CKD stage IV, CAD, bradycardia, diabetes mellitus type 2, hypertension seen today in PACU as a direct admit after having Right IJ tunneled dialysis catheter placement by vascular MD Dr. [name] for temporary hemodialysis need.  Admission requested to medicine for ongoing management and heart failure and diabetes, patient's been seen by nephrology, and dialyzed overnight.   Assessment & Plan:   Principal Problem:   SOB (shortness of breath) Active Problems:   Malnutrition of moderate degree    Acute on chronic combined heart failure Shortness of breath, orthopnea Pulmonary edema -Evidence of volume overload on physical exam and on imaging as chest x-ray significant for moderate edema. -echo 5/26 with LVEF of 35-40%, hypokinesis of the mid-apical, anterior, anterolateral and apical wall, g2DD, moderately dilated LA -Dyspnea in the setting of volume overload, acute on chronic combined CHF. - On IV Lasix  as needed, but she is oliguric, but much improvement with the IV diuresis, please see discussion below under ESRD.   -She was encouraged to use incentive spirometry. - Volume management with dialysis, recent hospitalization for IV diuresis for which she has been -6 pounds.  Chronic kidney stage V, now new ESRD - Input greatly appreciated, volume status much improved with initiation of dialysis, he is currently on room air, and for HD today , apnea and dyspnea has resolved . -Has [facility] cath in place -Discussed with vascular surgery regarding her access, for now  [facility] remains appropriate, no plan for AV fistula or graft especially for anticipated cardiac procedures -Patient has been CLIP'd to [facility] at 11:45 am. Pt could start on Monday there.    Aortic stenosis -- echo 5/26 with severe low flow low gradient aortic stenosis, AVA 0.89, mean gradient -- has follow up arranged outpatient for ongoing TAVR discussions with Dr. Lorie Rook, scheduled for 6/23   Bradycardia  -- following with EP, Dr. Arlester Ladd with plans for leadless PPM on 6/19 -- No bradycardia while inpatient -Sinew to monitor on telemetry -Have discussed with EP upon family's request, they would like to avoid pacemaker insertion during hospital stay due to high risk of infection, so this will be done as an outpatient on her previously scheduled appointment   CAD --recent outpatient high risk PET CT in the LAD territory. - on ASA, statin   Hypertension -continue norvasc  5mg  daily, hydralazine  25mg  TID, likely with further dialysis blood pressure will be more soft than we will start cutting down on these meds   DM -He is currently on insulin  sliding scale, will resume 70/30 insulin  at a lower dose 10 units twice daily  Hypothyroidism - cont with Synthroid    DVT prophylaxis: (Lovenox/Heparin /SCD's/anticoagulated/None (if comfort care) Code Status: FULL Family Communication: Discussed with multiple family members with conference phone call today Disposition:   Status is: Clear will need to be transition to inpatient given ongoing need for dialysis and need to be clipped before she stable for discharge    Consultants:  Nephrology  discussed with vascular surgery Dr. [name] and EP cardiology    Subjective:  Patient reports her dyspnea has resolved,  feeling much better today.  Objective: Vitals:   06/19/23 1230 06/19/23 1300 06/19/23 1320 06/19/23 1328  BP: (!) 97/53 (!) 108/57 120/71 122/78  Pulse: 69 75 73 74  Resp: 17 17 17 18   Temp:    98 F (36.7 C)   TempSrc:      SpO2: 95% 97% 98% 100%  Weight:    80.5 kg  Height:        Intake/Output Summary (Last 24 hours) at 06/19/2023 1351 Last data filed at 06/19/2023 1328 Gross per 24 hour  Intake 300 ml  Output 3300 ml  Net -3000 ml   Filed Weights   06/17/23 1518 06/17/23 2200 06/19/23 1328  Weight: 83.9 kg 82.5 kg 80.5 kg    Examination:  Awake Alert, Oriented X 3, No new F.N deficits, Normal affect Symmetrical Chest wall movement, good air entry today RRR,No Gallops,Rubs, significant systolic murmur +ve B.Sounds, Abd Soft, No tenderness, No rebound - guarding or rigidity. No Cyanosis, Clubbing or edema, No new Rash or bruise       Data Reviewed: I have personally reviewed following labs and imaging studies  CBC: Recent Labs  Lab 06/13/23 0804 06/14/23 0437 06/15/23 0419 06/17/23 0913 06/18/23 0604 06/19/23 0817  WBC 6.7 6.2 7.0  --  6.4 8.1  HGB 8.6* 8.2* 8.1* 10.2* 8.6* 8.9*  HCT 26.4* 25.0* 25.0* 30.0* 25.8* 27.5*  MCV 85.7 85.9 86.5  --  86.0 86.5  PLT 446* 415* 425*  --  433* 400    Basic Metabolic Panel: Recent Labs  Lab 06/13/23 0804 06/14/23 0758 06/15/23 0419 06/17/23 0913 06/18/23 0604 06/19/23 0817  NA 131* 130* 131* 129* 131* 132*  K 3.4* 3.7 4.2 4.0 3.7 4.2  CL 98 100 100 102 97* 95*  CO2 19* 21* 21*  --  23 22  GLUCOSE 215* 193* 133* 201* 157* 277*  BUN 50* 51* 52* 54* 31* 49*  CREATININE 3.19* 3.19* 3.11* 3.50* 2.51* 3.36*  CALCIUM  8.7* 8.7* 8.7*  --  8.9 9.1  MG  --   --   --   --  1.8  --   PHOS  --   --   --   --  3.7 4.1    GFR: Estimated Creatinine Clearance: 12.8 mL/min (A) (by C-G formula based on SCr of 3.36 mg/dL (H)).  Liver Function Tests: Recent Labs  Lab 06/17/23 1453 06/18/23 0604 06/19/23 0817  AST 28 25  --   ALT 22 22  --   ALKPHOS 80 68  --   BILITOT 1.0 0.7  --   PROT 7.3 6.8  --   ALBUMIN 3.6 3.3* 3.4*    CBG: Recent Labs  Lab 06/18/23 0726 06/18/23 1206 06/18/23 1621 06/18/23 2031  06/19/23 0731  GLUCAP 161* 303* 123* 288* 173*     Recent Results (from the past 240 hours)  Resp panel by RT-PCR (RSV, Flu A&B, Covid) Anterior Nasal Swab     Status: None   Collection Time: 06/12/23  7:09 PM   Specimen: Anterior Nasal Swab  Result Value Ref Range Status   SARS Coronavirus 2 by RT PCR NEGATIVE NEGATIVE Final    Comment: (NOTE) SARS-CoV-2 target nucleic acids are NOT DETECTED.  The SARS-CoV-2 RNA is generally detectable in upper respiratory specimens during the acute phase of infection. The lowest concentration of SARS-CoV-2 viral copies this assay can detect is 138 copies/mL. A negative result does not preclude SARS-Cov-2 infection and should not be used as the sole  basis for treatment or other patient management decisions. A negative result may occur with  improper specimen collection/handling, submission of specimen other than nasopharyngeal swab, presence of viral mutation(s) within the areas targeted by this assay, and inadequate number of viral copies(<138 copies/mL). A negative result must be combined with clinical observations, patient history, and epidemiological information. The expected result is Negative.  Fact Sheet for Patients:  BloggerCourse.com  Fact Sheet for Healthcare Providers:  SeriousBroker.it  This test is no t yet approved or cleared by the [place]  FDA and  has been authorized for detection and/or diagnosis of SARS-CoV-2 by FDA under an Emergency Use Authorization (EUA). This EUA will remain  in effect (meaning this test can be used) for the duration of the COVID-19 declaration under Section 564(b)(1) of the Act, 21 U.S.C.section 360bbb-3(b)(1), unless the authorization is terminated  or revoked sooner.       Influenza A by PCR NEGATIVE NEGATIVE Final   Influenza B by PCR NEGATIVE NEGATIVE Final    Comment: (NOTE) The Xpert Xpress SARS-CoV-2/FLU/RSV plus assay is intended as  an aid in the diagnosis of influenza from Nasopharyngeal swab specimens and should not be used as a sole basis for treatment. Nasal washings and aspirates are unacceptable for Xpert Xpress SARS-CoV-2/FLU/RSV testing.  Fact Sheet for Patients: BloggerCourse.com  Fact Sheet for Healthcare Providers: SeriousBroker.it  This test is not yet approved or cleared by the [place]  FDA and has been authorized for detection and/or diagnosis of SARS-CoV-2 by FDA under an Emergency Use Authorization (EUA). This EUA will remain in effect (meaning this test can be used) for the duration of the COVID-19 declaration under Section 564(b)(1) of the Act, 21 U.S.C. section 360bbb-3(b)(1), unless the authorization is terminated or revoked.     Resp Syncytial Virus by PCR NEGATIVE NEGATIVE Final    Comment: (NOTE) Fact Sheet for Patients: BloggerCourse.com  Fact Sheet for Healthcare Providers: SeriousBroker.it  This test is not yet approved or cleared by the [place]  FDA and has been authorized for detection and/or diagnosis of SARS-CoV-2 by FDA under an Emergency Use Authorization (EUA). This EUA will remain in effect (meaning this test can be used) for the duration of the COVID-19 declaration under Section 564(b)(1) of the Act, 21 U.S.C. section 360bbb-3(b)(1), unless the authorization is terminated or revoked.  Performed at [facility], [address], [place], [place] [address]          Radiology Studies: No results found.       Scheduled Meds:  amLODipine   5 mg Oral QHS   atorvastatin   40 mg Oral QHS   Chlorhexidine  Gluconate Cloth  6 each Topical Q0600   feeding supplement (KATE FARMS STANDARD 1.4)  325 mL Oral Daily   heparin   5,000 Units Subcutaneous Q8H   hydrALAZINE   25 mg Oral TID   insulin  aspart  0-15 Units Subcutaneous TID WC   insulin   aspart protamine- aspart  10 Units Subcutaneous BID WC   levothyroxine   100 mcg Oral Once per day on Sunday Tuesday Wednesday Thursday Saturday   levothyroxine   50 mcg Oral Once per day on Monday Friday   multivitamin  1 tablet Oral QHS   pantoprazole   60 mg Oral Daily   sodium chloride  flush  3 mL Intravenous Q12H   Continuous Infusions:   LOS: 1 day        Kwane Rohl, MD [facility]   To contact the attending provider between 7A-7P or the covering provider during  after hours 7P-7A, please log into the web site [email or web address] and access using universal [place] password for that web site. If you do not have the password, please call the hospital operator.  06/19/2023, 1:51 PM

## 2023-06-19 NOTE — Progress Notes (Signed)
   06/19/23 1328  Vitals  Temp 98 F (36.7 C)  Pulse Rate 74  Resp 18  BP 122/78  SpO2 94 %  O2 Device Room Air  Weight 80.5 kg  Type of Weight Post-Dialysis  Oxygen Therapy  Pulse Oximetry Type Continuous  Oximetry Probe Site Changed No  Post Treatment  Dialyzer Clearance Lightly streaked  Hemodialysis Intake (mL) 0 mL  Liters Processed 63  Fluid Removed (mL) 2000 mL  Tolerated HD Treatment Yes   Received patient in bed to unit.  Alert and oriented.  Informed consent signed and in chart.   TX duration:3.0  Patient tolerated well.  Transported back to the room  Alert, without acute distress.  Hand-off given to patient's nurse.   Access used: [facility] Access issues: no complications  Total UF removed: 2000 Medication(s) given: none   [facility]

## 2023-06-19 NOTE — Progress Notes (Signed)
 [facility] Progress Note  Subjective:  Seen in room Off O2 No SOB overnight  Vitals:   06/18/23 1617 06/18/23 2033 06/19/23 0314 06/19/23 0737  BP: (!) 121/56 (!) 141/51 125/60 (!) 122/53  Pulse: 67  70 65  Resp: 15 16    Temp: 97.7 F (36.5 C) (!) 97.5 F (36.4 C) 97.7 F (36.5 C) 97.8 F (36.6 C)  TempSrc: Oral Oral Oral Oral  SpO2: 97%  98% 96%  Weight:      Height:        Exam: Gen alert, no distress, 2 L [place] O2 No jvd or bruits Chest CTA today RRR no MRG Abd soft ntnd no mass or ascites +bs Ext no LE edema Neuro is alert, Ox 3 , nf    RIJ TDC intact    Renal-related home meds: Norvasc  5 at bedtime Lasix  40 mg as needed Hydralazine  25 mg 3 times daily   ESRD: new esrd pt, needs CLIP    Assessment/ Plan: SOB/ orthopnea: initial CXR +pulm edema and pt was having severe orthopnea at home. Had 1st HD 5/29. Next HD today. SOB/ orthopnea have resolved.  Vol overload: stable, off O2 today. Looks euvolemic. Cont to lower vol w/ HD CKD 5/ new ESRD: 2nd HD today. Has TDC in place. No plans for permanent access at this time. Pt has been CLIP'd to [facility] at 11:45 am. Pt could start on Monday there.  HTN: on home norvasc  / hydralazine , continue SP TDC R chest: by [facility] 5/29.       Rhonda Poag MD  [facility] 06/19/2023, 9:00 AM  Recent Labs  Lab 06/15/23 0419 06/17/23 0913 06/17/23 1453 06/18/23 0604  HGB 8.1* 10.2*  --  8.6*  ALBUMIN  --   --  3.6 3.3*  CALCIUM  8.7*  --   --  8.9  PHOS  --   --   --  3.7  CREATININE 3.11* 3.50*  --  2.51*  K 4.2 4.0  --  3.7   No results for input(s): "IRON ", "TIBC", "FERRITIN" in the last 168 hours. Inpatient medications:  amLODipine   5 mg Oral QHS   atorvastatin   40 mg Oral QHS   Chlorhexidine  Gluconate Cloth  6 each Topical Q0600   feeding supplement (KATE FARMS STANDARD 1.4)  325 mL Oral Daily   [START ON 06/20/2023] heparin   2,500 Units Dialysis Once in dialysis   heparin   5,000 Units Subcutaneous Q8H    hydrALAZINE   25 mg Oral TID   insulin  aspart  0-15 Units Subcutaneous TID WC   insulin  aspart protamine- aspart  10 Units Subcutaneous BID WC   levothyroxine   100 mcg Oral Once per day on Sunday Tuesday Wednesday Thursday Saturday   levothyroxine   50 mcg Oral Once per day on Monday Friday   multivitamin  1 tablet Oral QHS   pantoprazole   60 mg Oral Daily   sodium chloride  flush  3 mL Intravenous Q12H    anticoagulant sodium citrate     acetaminophen  **OR** acetaminophen , alteplase, anticoagulant sodium citrate, feeding supplement (NEPRO CARB STEADY), heparin , [START ON 06/20/2023] heparin , hydrALAZINE , lidocaine  (PF), lidocaine -prilocaine, nitroGLYCERIN , pentafluoroprop-tetrafluoroeth

## 2023-06-20 DIAGNOSIS — R0602 Shortness of breath: Secondary | ICD-10-CM | POA: Diagnosis not present

## 2023-06-20 LAB — GLUCOSE, CAPILLARY: Glucose-Capillary: 151 mg/dL — ABNORMAL HIGH (ref 70–99)

## 2023-06-20 NOTE — Discharge Instructions (Signed)
 Follow with Primary MD Rhonda Robins, MD in 7 days   Get CBC, CMP,checked  by Primary MD next visit.    Activity: As tolerated with Full fall precautions use walker/cane & assistance as needed   Disposition Home    Diet: renal, heart healthy  On your next visit with your primary care physician please Get Medicines reviewed and adjusted.   Please request your Prim.MD to go over all Hospital Tests and Procedure/Radiological results at the follow up, please get all Hospital records sent to your Prim MD by signing hospital release before you go home.   If you experience worsening of your admission symptoms, develop shortness of breath, life threatening emergency, suicidal or homicidal thoughts you must seek medical attention immediately by calling 911 or calling your MD immediately  if symptoms less severe.  You Must read complete instructions/literature along with all the possible adverse reactions/side effects for all the Medicines you take and that have been prescribed to you. Take any new Medicines after you have completely understood and accpet all the possible adverse reactions/side effects.   Do not drive, operating heavy machinery, perform activities at heights, swimming or participation in water  activities or provide baby sitting services if your were admitted for syncope or siezures until you have seen by Primary MD or a Neurologist and advised to do so again.  Do not drive when taking Pain medications.    Do not take more than prescribed Pain, Sleep and Anxiety Medications  Special Instructions: If you have smoked or chewed Tobacco  in the last 2 yrs please stop smoking, stop any regular Alcohol   and or any Recreational drug use.  Wear Seat belts while driving.   Please note  You were cared for by a hospitalist during your hospital stay. If you have any questions about your discharge medications or the care you received while you were in the hospital after you are  discharged, you can call the unit and asked to speak with the hospitalist on call if the hospitalist that took care of you is not available. Once you are discharged, your primary care physician will handle any further medical issues. Please note that NO REFILLS for any discharge medications will be authorized once you are discharged, as it is imperative that you return to your primary care physician (or establish a relationship with a primary care physician if you do not have one) for your aftercare needs so that they can reassess your need for medications and monitor your lab values.

## 2023-06-20 NOTE — Plan of Care (Signed)

## 2023-06-20 NOTE — Progress Notes (Signed)
 Reviewed AVS, patient expressed understanding of medications, MD follow up reviewed.   Removed IV, Site clean, dry and intact.  Patient states all belongings brought to the hospital at time of admission are accounted for and packed to take home.  Pt transported to entrance A where family member was waiting in vehicle to transport home.

## 2023-06-20 NOTE — Discharge Planning (Signed)
 [facility]  Initial Hemodialysis Orders  Dialysis center: TCU   Patient's name: Rhonda Clayton DOB: [birth year] ESRD PMH: T2DM, HTN, Hypothyroid, AS (ongoing eval for TAVR), bradycardia, chronic HFrEF  Discharge diagnosis: Volume overload/decompensated CHF.  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  Initiated dialysis  Bradycardia. EP following. For PPM placement.   Allergies:  Allergies  Allergen Reactions   Coffee Flavoring Agent (Non-Screening) Other (See Comments)    Sick on stomach, sneezing, backed up   Azithromycin  Other (See Comments)   Fluogen [Influenza Virus Vaccine]    Molds & Smuts     Allergy test showed    Pneumococcal Vac Polyvalent Other (See Comments)    Large site reaction    Cephalexin  Other (See Comments)    Yeast infection   Ciprofloxacin Other (See Comments)    tendinitis   Oatmeal Other (See Comments)    Sneezing and drainage   Sulfa Antibiotics Other (See Comments)    Yeast infection   Date of First Dialysis: 06/17/23 Cause of renal disease: Likely cardiorenal   Dialysis Prescription: Dialysis Frequency: Per [facility]CU protocol  Tx duration: Per [facility] protocol  EDW: 79 kg   Dialyzer: 180NRe UF profile/Sodium modeling?: -- Dialysis Bath: 2 K 2 Ca  Dialysis access: Access type: R internal jugular TDC placed 06/17/23 by Dr. [name] No perm access yet. Needs cardiology workup   In Center Medications: Heparin :  2000 units bolus  VDRA: Per protocol  Venofer :  Per protocol Mircera: 30 mcg IV q 2 weeks   Discharge labs: Hgb: 8.9      K+: 4.2  Ca: 9.1    Phos: 4.1   Alb: 3.4  Please draw routine monthly labs on arrival.   Juanda Noon Vela Render PA-C

## 2023-06-20 NOTE — Discharge Summary (Signed)
 Physician Discharge Summary  DEAIRRA HALLECK ZOX:[id number] DOB: 1945/09/11 DOA: 06/17/2023  PCP: Avva, Ravisankar, MD  Admit date: 06/17/2023 Discharge date: 06/20/2023  Admitted From: (Home) Disposition:  (Home )  Recommendations for Outpatient Follow-up:  Follow up with PCP in 1-2 weeks Please obtain BMP/CBC in one week  Diet recommendation:  Carb Modified / renal  Brief/Interim Summary:  MAYMIE BRUNKE is a 78 y.o. female with past medical history  of   aortic stenosis, CKD V, diabetes mellitus, hypothyroidism, last discharged on 27 May by cardiology for acute on chronic combined congestive heart failure, aortic stenosis-currently ongoing TAVR discussion, CKD stage IV, CAD, bradycardia, diabetes mellitus type 2, hypertension, patient with worsening renal function, symptomatic volume overload, so plan was to initiate hemodialysis, so she went for tunneled dialysis catheter by vascular surgery, but apparently she was dyspneic with evidence of volume overload after catheter was inserted, evaluated in PACU, Admission requested to medicine for ongoing management and heart failure and diabetes, patient's been seen by nephrology, and dialyzed overnight.  Please see discussion below.     Acute on chronic combined heart failure Shortness of breath, orthopnea Pulmonary edema -Evidence of volume overload on physical exam and on imaging as chest x-ray significant for moderate edema. -echo 5/26 with LVEF of 35-40%, hypokinesis of the mid-apical, anterior, anterolateral and apical wall, g2DD, moderately dilated LA -Dyspnea in the setting of volume overload, acute on chronic combined CHF. - On IV Lasix  as needed, but she is oliguric, but much improvement with initiation of dialysis, please see discussion below under ESRD.   -She was encouraged to use incentive spirometry. - Volume management with dialysis, recent hospitalization for IV diuresis for which she has been -6 pounds.   Chronic kidney  stage V, now new ESRD - Input greatly appreciated, volume status much improved with initiation of dialysis, he is currently on room air, and for HD today , apnea and dyspnea has resolved . -Has [facility] cath in place -Discussed with vascular surgery regarding her access, for now [facility] remains appropriate, no plan for AV fistula or graft especially for anticipated cardiac procedures -Patient has been CLIP'd to [facility] at 11:45 am. Pt could start on Monday there.    Aortic stenosis -- echo 5/26 with severe low flow low gradient aortic stenosis, AVA 0.89, mean gradient -- has follow up arranged outpatient for ongoing TAVR discussions with Dr. Lorie Rook, scheduled for 6/23   Bradycardia  -- following with EP, Dr. Arlester Ladd with plans for leadless PPM on 6/19 -- No bradycardia while inpatient -Sinew to monitor on telemetry -Have discussed with EP upon family's request, they would like to avoid pacemaker insertion during hospital stay due to high risk of infection, so this will be done as an outpatient on her previously scheduled appointment   CAD --recent outpatient high risk PET CT in the LAD territory. - on ASA, statin   Hypertension -continue norvasc  5mg  daily, hydralazine  25mg  TID, likely with further dialysis blood pressure will be more soft than we will start cutting down on these meds   DM - Resume home regimen   Hypothyroidism - cont with Synthroid     Discharge Diagnoses:  Principal Problem:   SOB (shortness of breath) Active Problems:   DM (diabetes mellitus) (HCC)   HTN (hypertension)   Thyroid  activity decreased   Severe aortic stenosis   Malnutrition of moderate degree    Discharge Instructions  Discharge Instructions     Diet - low sodium heart healthy  Complete by: As directed    Discharge instructions   Complete by: As directed    Follow with Primary MD Avva, Ravisankar, MD in 7 days   Get CBC, CMP,checked  by Primary MD next visit.    Activity: As  tolerated with Full fall precautions use walker/cane & assistance as needed   Disposition Home    Diet: renal, heart healthy  On your next visit with your primary care physician please Get Medicines reviewed and adjusted.   Please request your Prim.MD to go over all Hospital Tests and Procedure/Radiological results at the follow up, please get all Hospital records sent to your Prim MD by signing hospital release before you go home.   If you experience worsening of your admission symptoms, develop shortness of breath, life threatening emergency, suicidal or homicidal thoughts you must seek medical attention immediately by calling 911 or calling your MD immediately  if symptoms less severe.  You Must read complete instructions/literature along with all the possible adverse reactions/side effects for all the Medicines you take and that have been prescribed to you. Take any new Medicines after you have completely understood and accpet all the possible adverse reactions/side effects.   Do not drive, operating heavy machinery, perform activities at heights, swimming or participation in water  activities or provide baby sitting services if your were admitted for syncope or siezures until you have seen by Primary MD or a Neurologist and advised to do so again.  Do not drive when taking Pain medications.    Do not take more than prescribed Pain, Sleep and Anxiety Medications  Special Instructions: If you have smoked or chewed Tobacco  in the last 2 yrs please stop smoking, stop any regular Alcohol   and or any Recreational drug use.  Wear Seat belts while driving.   Please note  You were cared for by a hospitalist during your hospital stay. If you have any questions about your discharge medications or the care you received while you were in the hospital after you are discharged, you can call the unit and asked to speak with the hospitalist on call if the hospitalist that took care of you is not  available. Once you are discharged, your primary care physician will handle any further medical issues. Please note that NO REFILLS for any discharge medications will be authorized once you are discharged, as it is imperative that you return to your primary care physician (or establish a relationship with a primary care physician if you do not have one) for your aftercare needs so that they can reassess your need for medications and monitor your lab values.   Increase activity slowly   Complete by: As directed       Allergies as of 06/20/2023       Reactions   Coffee Flavoring Agent (non-screening) Other (See Comments)   Sick on stomach, sneezing, backed up   Azithromycin  Other (See Comments)   Fluogen [influenza Virus Vaccine]    Molds & Smuts    Allergy test showed    Pneumococcal Vac Polyvalent Other (See Comments)   Large site reaction    Cephalexin  Other (See Comments)   Yeast infection   Ciprofloxacin Other (See Comments)   tendinitis   Oatmeal Other (See Comments)   Sneezing and drainage   Sulfa Antibiotics Other (See Comments)   Yeast infection        Medication List     STOP taking these medications    furosemide  40 MG tablet Commonly  known as: LASIX        TAKE these medications    amLODipine  5 MG tablet Commonly known as: NORVASC  Take 1 tablet (5 mg total) by mouth at bedtime.   aspirin  EC 81 MG tablet Take 1 tablet (81 mg total) by mouth daily. Swallow whole.   atorvastatin  40 MG tablet Commonly known as: LIPITOR Take 1 tablet (40 mg total) by mouth at bedtime.   B-D UF III MINI PEN NEEDLES 31G X 5 MM Misc Generic drug: Insulin  Pen Needle Inject into the skin 2 (two) times daily.   betamethasone valerate 0.1 % cream Commonly known as: VALISONE Apply 1 Application topically as needed (skin condition).   FreeStyle Libre 3 Sensor Misc change every 14 days topically to monitor blood glucose continuously 28 days   hydrALAZINE  25 MG tablet Commonly  known as: APRESOLINE  Take 25 mg by mouth 3 (three) times daily.   nitroGLYCERIN  0.4 MG SL tablet Commonly known as: NITROSTAT  Place 1 tablet (0.4 mg total) under the tongue every 5 (five) minutes as needed for chest pain.   NovoLOG  Mix 70/30 FlexPen (70-30) 100 UNIT/ML FlexPen Generic drug: insulin  aspart protamine - aspart Inject into the skin in the morning and at bedtime. Takes 15 units in the morning and 9-10 units at bedtime   OneTouch Delica Plus Lancet33G Misc Apply topically 3 (three) times daily.   OneTouch Verio test strip Generic drug: glucose blood 3 (three) times daily.   pantoprazole  40 MG tablet Commonly known as: PROTONIX  Take 1.5 tablets by mouth daily.   Synthroid  100 MCG tablet Generic drug: levothyroxine  Take 100 mcg by mouth See admin instructions. Takes 1 tablet daily except for Monday and Friday she takes 0.5 tablet               Durable Medical Equipment  (From admission, onward)           Start     Ordered   06/18/23 1238  For home use only DME 4 wheeled rolling walker with seat  Once       Question:  Patient needs a walker to treat with the following condition  Answer:  Weakness   06/18/23 1238            Follow-up Information     [facility], [facility] Follow up.   Specialty: Home Health Services Why: Home health Contact information: [address] [place] [address] [phone]         [facility] Go on 06/21/2023.   Why: Transitional Care Unit- Monday, Tuesday, Thursday, Friday with 11:45 am chair time.  On Monday (6/2), please arrive at 11:15 am to complete paperwork prior to treatment. Contact information: [address] [place] [address] [phone]                Allergies  Allergen Reactions   Coffee Flavoring Agent (Non-Screening) Other (See Comments)    Sick on stomach, sneezing, backed up   Azithromycin  Other (See Comments)   Fluogen [Influenza Virus Vaccine]     Molds & Smuts     Allergy test showed    Pneumococcal Vac Polyvalent Other (See Comments)    Large site reaction    Cephalexin  Other (See Comments)    Yeast infection   Ciprofloxacin Other (See Comments)    tendinitis   Oatmeal Other (See Comments)    Sneezing and drainage   Sulfa Antibiotics Other (See Comments)    Yeast infection    Consultations:  Nephrology   Procedures/Studies: DG C-Arm 1-60 Min Result Date: 06/17/2023 CLINICAL DATA:  Dialysis catheter EXAM: DG C-ARM 1-60 MIN FLUOROSCOPY: Fluoroscopy Time:  2.3 seconds Radiation Exposure Index (if provided by the fluoroscopic device): 1.29 mGy Number of Acquired Spot Images: 3 COMPARISON:  Chest x-ray 06/12/2023 FINDINGS: Three low resolution intraoperative spot views of the right chest demonstrate a right-sided central venous catheter with tip at the right atrium IMPRESSION: Intraoperative fluoroscopic assistance provided during right dialysis catheter placement Electronically Signed   By: Esmeralda Hedge M.D.   On: 06/17/2023 17:07   DG CHEST PORT 1 VIEW Result Date: 06/17/2023 CLINICAL DATA:  End-stage renal disease EXAM: PORTABLE CHEST 1 VIEW COMPARISON:  06/12/2023 FINDINGS: New right-sided central venous catheter with tip at the cavoatrial junction. Stable cardiomediastinal silhouette with vascular congestion and pulmonary edema. Probable small pleural effusions. Aortic atherosclerosis. Posterior thoracolumbar region spinal hardware IMPRESSION: 1. New right-sided central venous catheter with tip at the cavoatrial junction. No pneumothorax 2. Vascular congestion and pulmonary edema with probable small pleural effusions. Electronically Signed   By: Esmeralda Hedge M.D.   On: 06/17/2023 15:51   ECHOCARDIOGRAM COMPLETE Result Date: 06/14/2023    ECHOCARDIOGRAM REPORT   Patient Name:   NICKOLA LENIG Date of Exam: 06/14/2023 Medical Rec #:  [id number]       Height:       61.0 in Accession #:    [id number]      Weight:       186.9 lb Date of  Birth:  1945-01-27       BSA:          1.835 m Patient Age:    78 years        BP:           105/62 mmHg Patient Gender: F               HR:           68 bpm. Exam Location:  Inpatient Procedure: 2D Echo, 3D Echo, Cardiac Doppler and Color Doppler (Both Spectral            and Color Flow Doppler were utilized during procedure). Indications:    Aortic Stenosis l35.0  History:        Patient has prior history of Echocardiogram examinations, most                 recent 04/08/2023. CHF, NSTEMI, Aortic Valve Disease,                 Arrythmias:RBBB; Risk Factors:Hypertension, Diabetes and                 Dyslipidemia.  Sonographer:    Denese Finn RCS Referring Phys: [phone] PETER M [place] IMPRESSIONS  1. Left ventricular ejection fraction, by estimation, is 35 to 40%. The left ventricle has moderately decreased function. The left ventricle demonstrates regional wall motion abnormalities severe hypokinesis of the mid-apical anterior wall, the mid-apical anterolateral wall, the apical septal wall, and the true apex. There is mild concentric left ventricular hypertrophy. Left ventricular diastolic parameters are consistent with Grade II diastolic dysfunction (pseudonormalization).  2. Right ventricular systolic function is normal. The right ventricular size is mildly enlarged. There is moderately elevated pulmonary artery systolic pressure. The estimated right ventricular systolic pressure is 51.6 mmHg.  3. Left atrial size was moderately dilated.  4. Right atrial size was mildly dilated.  5. The mitral valve is degenerative. Mild mitral valve regurgitation. No evidence of  mitral stenosis. Moderate mitral annular calcification.  6. The aortic valve is tricuspid. There is severe calcifcation of the aortic valve. Aortic valve regurgitation is not visualized. Severe low flow/low gradient aortic valve stenosis. Aortic valve area, by VTI measures 0.89 cm. Aortic valve mean gradient  measures 26.0 mmHg.  7. The inferior vena cava  is normal in size with <50% respiratory variability, suggesting right atrial pressure of 8 mmHg. FINDINGS  Left Ventricle: Left ventricular ejection fraction, by estimation, is 35 to 40%. The left ventricle has moderately decreased function. The left ventricle demonstrates regional wall motion abnormalities. The left ventricular internal cavity size was normal in size. There is mild concentric left ventricular hypertrophy. Left ventricular diastolic parameters are consistent with Grade II diastolic dysfunction (pseudonormalization). Right Ventricle: The right ventricular size is mildly enlarged. No increase in right ventricular wall thickness. Right ventricular systolic function is normal. There is moderately elevated pulmonary artery systolic pressure. The tricuspid regurgitant velocity is 3.30 m/s, and with an assumed right atrial pressure of 8 mmHg, the estimated right ventricular systolic pressure is 51.6 mmHg. Left Atrium: Left atrial size was moderately dilated. Right Atrium: Right atrial size was mildly dilated. Pericardium: There is no evidence of pericardial effusion. Mitral Valve: The mitral valve is degenerative in appearance. There is mild calcification of the mitral valve leaflet(s). Moderate mitral annular calcification. Mild mitral valve regurgitation. No evidence of mitral valve stenosis. Tricuspid Valve: The tricuspid valve is normal in structure. Tricuspid valve regurgitation is trivial. Aortic Valve: The aortic valve is tricuspid. There is severe calcifcation of the aortic valve. Aortic valve regurgitation is not visualized. Severe aortic stenosis is present. Aortic valve mean gradient measures 26.0 mmHg. Aortic valve peak gradient measures 38.2 mmHg. Aortic valve area, by VTI measures 0.89 cm. Pulmonic Valve: The pulmonic valve was normal in structure. Pulmonic valve regurgitation is not visualized. Venous: The inferior vena cava is normal in size with less than 50% respiratory variability,  suggesting right atrial pressure of 8 mmHg. IAS/Shunts: No atrial level shunt detected by color flow Doppler.  LEFT VENTRICLE PLAX 2D LVIDd:         5.30 cm   Diastology LVIDs:         3.60 cm   LV e' medial:    4.46 cm/s LV PW:         1.10 cm   LV E/e' medial:  30.9 LV IVS:        1.20 cm   LV e' lateral:   6.31 cm/s LVOT diam:     2.00 cm   LV E/e' lateral: 21.9 LV SV:         69 LV SV Index:   38 LVOT Area:     3.14 cm                           3D Volume EF:                          3D EF:        56 %                          LV EDV:       155 ml                          LV ESV:  69 ml                          LV SV:        86 ml RIGHT VENTRICLE RV S prime:     11.50 cm/s TAPSE (M-mode): 2.6 cm LEFT ATRIUM             Index        RIGHT ATRIUM           Index LA diam:        4.00 cm 2.18 cm/m   RA Area:     18.50 cm LA Vol (A2C):   90.8 ml 49.48 ml/m  RA Volume:   58.60 ml  31.93 ml/m LA Vol (A4C):   80.7 ml 43.97 ml/m LA Biplane Vol: 85.5 ml 46.59 ml/m  AORTIC VALVE AV Area (Vmax):    0.85 cm AV Area (Vmean):   0.81 cm AV Area (VTI):     0.89 cm AV Vmax:           309.00 cm/s AV Vmean:          226.667 cm/s AV VTI:            0.777 m AV Peak Grad:      38.2 mmHg AV Mean Grad:      26.0 mmHg LVOT Vmax:         83.50 cm/s LVOT Vmean:        58.300 cm/s LVOT VTI:          0.221 m LVOT/AV VTI ratio: 0.28  AORTA Ao Root diam: 3.40 cm MITRAL VALVE                TRICUSPID VALVE MV Area (PHT): 4.60 cm     TR Peak grad:   43.6 mmHg MV Decel Time: 165 msec     TR Vmax:        330.00 cm/s MV E velocity: 138.00 cm/s MV A velocity: 83.30 cm/s   SHUNTS MV E/A ratio:  1.66         Systemic VTI:  0.22 m                             Systemic Diam: 2.00 cm Dalton McleanMD Electronically signed by Archer Bear Signature Date/Time: 06/14/2023/3:58:40 PM    Final    DG Chest 2 View Result Date: 06/12/2023 CLINICAL DATA:  Short of breath, dyspnea EXAM: CHEST - 2 VIEW COMPARISON:  01/03/2023, 06/08/2023 FINDINGS:  Frontal and lateral views of the chest demonstrate an enlarged cardiac silhouette. There is increased pulmonary vascular congestion with bilateral ground-glass airspace disease. There are small bilateral pleural effusions. No evidence of pneumothorax. Postsurgical changes from prior thoracolumbar spinal fusion. IMPRESSION: 1. Constellation of findings most consistent with congestive heart failure. Electronically Signed   By: Bobbye Burrow M.D.   On: 06/12/2023 20:14   NM PET CT CARDIAC PERFUSION MULTI W/ABSOLUTE BLOODFLOW Result Date: 06/09/2023   Findings are consistent with infarction with peri-infarct ischemia in virtually the entire LAD artery distribution. The study is high risk.   LV perfusion is abnormal. There is evidence of ischemia. There is evidence of infarction. Defect 1: There is a large defect with severe reduction in uptake present in the apical to mid anterior, anterolateral, anteroseptal and apex location(s) that is partially reversible. There is abnormal wall motion in the defect area. Consistent with infarction and  peri-infarct ischemia. The defect is consistent with abnormal perfusion in the LAD territory.   Findings are surprising in a view of the recent normal LV wall motion and overall LV systolic function on the echocardiogram from 04/08/2023, but may reflect an interval anteroapical infarction. Consider repeat echo.   Calculated LV EF is probably unreliable due to the marked R-R interval variability (frequent PACs). Rest left ventricular function is abnormal. There was a single regional abnormality. Rest EF: 33%. Stress left ventricular function is abnormal. There was a single regional abnormality. Stress EF: 15%. End diastolic cavity size is normal. End systolic cavity size is normal.   Myocardial blood flow was computed to be 0.95ml/g/min at rest and 0.72ml/g/min at stress. Global myocardial blood flow reserve was 0.74 and was highly abnormal.  MBFR is 0.74. The inverted myocardial  blood flow reserve is a highly unusual finding, possibly triggered by diastolic hypotension in a patient with aortic stenosis -related cardiomyopathy.   Coronary calcium  was present on the attenuation correction CT images. Severe coronary calcifications were present. Coronary calcifications were present in the left anterior descending artery and right coronary artery distribution(s).   Electronically signed by Luana Rumple, MD CLINICAL DATA:  This over-read does not include interpretation of cardiac or coronary anatomy or pathology. The Cardiac PET CT interpretation by the cardiologist is attached. COMPARISON:  None Available. FINDINGS: Cardiovascular: Aortic valve calcifications. Aortic atherosclerosis. Normal heart size. Three-vessel coronary artery calcifications. No pericardial effusion. Limited Mediastinum/Nodes: No enlarged mediastinal, hilar, or axillary lymph nodes. Trachea and esophagus demonstrate no significant findings. Limited Lungs/Pleura: Lungs are clear. No pleural effusion or pneumothorax. Upper Abdomen: No acute abnormality. Musculoskeletal: No chest wall abnormality. No acute osseous findings. IMPRESSION: 1. No acute CT findings of the included chest. 2. Coronary artery disease. 3. Aortic valve calcifications. Correlate for echocardiographic evidence of aortic valve dysfunction. Aortic Atherosclerosis (ICD10-I70.0). Electronically Signed   By: Fredricka Jenny M.D.   On: 06/08/2023 16:15     Subjective:  No significant events overnight, she denies any complaints today.  Discharge Exam: Vitals:   06/20/23 0400 06/20/23 0812  BP: (!) 119/56 (!) 103/52  Pulse: 68 69  Resp: 18 20  Temp:  (!) 97.3 F (36.3 C)  SpO2: 96% 100%   Vitals:   06/20/23 0349 06/20/23 0400 06/20/23 0500 06/20/23 0812  BP: 113/67 (!) 119/56  (!) 103/52  Pulse: 67 68  69  Resp: 18 18  20   Temp: 98 F (36.7 C)   (!) 97.3 F (36.3 C)  TempSrc: Oral   Oral  SpO2: 95% 96%  100%  Weight:   80.4 kg    Height:        General: Pt is alert, awake, not in acute distress Cardiovascular: RRR, S1/S2 +, no rubs, no gallops Respiratory: CTA bilaterally, no wheezing, no rhonchi Abdominal: Soft, NT, ND, bowel sounds + Extremities: no edema, no cyanosis    The results of significant diagnostics from this hospitalization (including imaging, microbiology, ancillary and laboratory) are listed below for reference.     Microbiology: Recent Results (from the past 240 hours)  Resp panel by RT-PCR (RSV, Flu A&B, Covid) Anterior Nasal Swab     Status: None   Collection Time: 06/12/23  7:09 PM   Specimen: Anterior Nasal Swab  Result Value Ref Range Status   SARS Coronavirus 2 by RT PCR NEGATIVE NEGATIVE Final    Comment: (NOTE) SARS-CoV-2 target nucleic acids are NOT DETECTED.  The SARS-CoV-2 RNA is generally detectable in upper  respiratory specimens during the acute phase of infection. The lowest concentration of SARS-CoV-2 viral copies this assay can detect is 138 copies/mL. A negative result does not preclude SARS-Cov-2 infection and should not be used as the sole basis for treatment or other patient management decisions. A negative result may occur with  improper specimen collection/handling, submission of specimen other than nasopharyngeal swab, presence of viral mutation(s) within the areas targeted by this assay, and inadequate number of viral copies(<138 copies/mL). A negative result must be combined with clinical observations, patient history, and epidemiological information. The expected result is Negative.  Fact Sheet for Patients:  BloggerCourse.com  Fact Sheet for Healthcare Providers:  SeriousBroker.it  This test is no t yet approved or cleared by the [place]  FDA and  has been authorized for detection and/or diagnosis of SARS-CoV-2 by FDA under an Emergency Use Authorization (EUA). This EUA will remain  in effect  (meaning this test can be used) for the duration of the COVID-19 declaration under Section 564(b)(1) of the Act, 21 U.S.C.section 360bbb-3(b)(1), unless the authorization is terminated  or revoked sooner.       Influenza A by PCR NEGATIVE NEGATIVE Final   Influenza B by PCR NEGATIVE NEGATIVE Final    Comment: (NOTE) The Xpert Xpress SARS-CoV-2/FLU/RSV plus assay is intended as an aid in the diagnosis of influenza from Nasopharyngeal swab specimens and should not be used as a sole basis for treatment. Nasal washings and aspirates are unacceptable for Xpert Xpress SARS-CoV-2/FLU/RSV testing.  Fact Sheet for Patients: BloggerCourse.com  Fact Sheet for Healthcare Providers: SeriousBroker.it  This test is not yet approved or cleared by the [place]  FDA and has been authorized for detection and/or diagnosis of SARS-CoV-2 by FDA under an Emergency Use Authorization (EUA). This EUA will remain in effect (meaning this test can be used) for the duration of the COVID-19 declaration under Section 564(b)(1) of the Act, 21 U.S.C. section 360bbb-3(b)(1), unless the authorization is terminated or revoked.     Resp Syncytial Virus by PCR NEGATIVE NEGATIVE Final    Comment: (NOTE) Fact Sheet for Patients: BloggerCourse.com  Fact Sheet for Healthcare Providers: SeriousBroker.it  This test is not yet approved or cleared by the [place]  FDA and has been authorized for detection and/or diagnosis of SARS-CoV-2 by FDA under an Emergency Use Authorization (EUA). This EUA will remain in effect (meaning this test can be used) for the duration of the COVID-19 declaration under Section 564(b)(1) of the Act, 21 U.S.C. section 360bbb-3(b)(1), unless the authorization is terminated or revoked.  Performed at [facility], [address], [place], [place] [address]       Labs: BNP (last 3 results) Recent Labs    01/03/23 0925 06/12/23 1945  BNP 709.1* 1,627.4*   Basic Metabolic Panel: Recent Labs  Lab 06/14/23 0758 06/15/23 0419 06/17/23 0913 06/18/23 0604 06/19/23 0817  NA 130* 131* 129* 131* 132*  K 3.7 4.2 4.0 3.7 4.2  CL 100 100 102 97* 95*  CO2 21* 21*  --  23 22  GLUCOSE 193* 133* 201* 157* 277*  BUN 51* 52* 54* 31* 49*  CREATININE 3.19* 3.11* 3.50* 2.51* 3.36*  CALCIUM  8.7* 8.7*  --  8.9 9.1  MG  --   --   --  1.8  --   PHOS  --   --   --  3.7 4.1   Liver Function Tests: Recent Labs  Lab 06/17/23 1453 06/18/23 0604 06/19/23 0817  AST 28 25  --  ALT 22 22  --   ALKPHOS 80 68  --   BILITOT 1.0 0.7  --   PROT 7.3 6.8  --   ALBUMIN 3.6 3.3* 3.4*   No results for input(s): "LIPASE", "AMYLASE" in the last 168 hours. No results for input(s): "AMMONIA" in the last 168 hours. CBC: Recent Labs  Lab 06/14/23 0437 06/15/23 0419 06/17/23 0913 06/18/23 0604 06/19/23 0817  WBC 6.2 7.0  --  6.4 8.1  HGB 8.2* 8.1* 10.2* 8.6* 8.9*  HCT 25.0* 25.0* 30.0* 25.8* 27.5*  MCV 85.9 86.5  --  86.0 86.5  PLT 415* 425*  --  433* 400   Cardiac Enzymes: No results for input(s): "CKTOTAL", "CKMB", "CKMBINDEX", "TROPONINI" in the last 168 hours. BNP: Invalid input(s): "POCBNP" CBG: Recent Labs  Lab 06/18/23 2031 06/19/23 0731 06/19/23 1635 06/19/23 2102 06/20/23 0725  GLUCAP 288* 173* 221* 205* 151*   D-Dimer No results for input(s): "DDIMER" in the last 72 hours. Hgb A1c Recent Labs    06/17/23 1453  HGBA1C 6.5*   Lipid Profile No results for input(s): "CHOL", "HDL", "LDLCALC", "TRIG", "CHOLHDL", "LDLDIRECT" in the last 72 hours. Thyroid  function studies Recent Labs    06/17/23 1453  TSH 2.136   Anemia work up No results for input(s): "VITAMINB12", "FOLATE", "FERRITIN", "TIBC", "IRON ", "RETICCTPCT" in the last 72 hours. Urinalysis    Component Value Date/Time   COLORURINE STRAW (A) 01/03/2023 1102    APPEARANCEUR CLEAR 01/03/2023 1102   LABSPEC 1.009 01/03/2023 1102   PHURINE 6.0 01/03/2023 1102   GLUCOSEU 150 (A) 01/03/2023 1102   HGBUR SMALL (A) 01/03/2023 1102   BILIRUBINUR NEGATIVE 01/03/2023 1102   BILIRUBINUR negative 05/02/2015 1724   BILIRUBINUR neg 03/01/2011 1243   KETONESUR NEGATIVE 01/03/2023 1102   PROTEINUR 100 (A) 01/03/2023 1102   UROBILINOGEN 0.2 05/02/2015 1724   UROBILINOGEN 0.2 03/31/2007 0750   NITRITE NEGATIVE 01/03/2023 1102   LEUKOCYTESUR NEGATIVE 01/03/2023 1102   Sepsis Labs Recent Labs  Lab 06/14/23 0437 06/15/23 0419 06/18/23 0604 06/19/23 0817  WBC 6.2 7.0 6.4 8.1   Microbiology Recent Results (from the past 240 hours)  Resp panel by RT-PCR (RSV, Flu A&B, Covid) Anterior Nasal Swab     Status: None   Collection Time: 06/12/23  7:09 PM   Specimen: Anterior Nasal Swab  Result Value Ref Range Status   SARS Coronavirus 2 by RT PCR NEGATIVE NEGATIVE Final    Comment: (NOTE) SARS-CoV-2 target nucleic acids are NOT DETECTED.  The SARS-CoV-2 RNA is generally detectable in upper respiratory specimens during the acute phase of infection. The lowest concentration of SARS-CoV-2 viral copies this assay can detect is 138 copies/mL. A negative result does not preclude SARS-Cov-2 infection and should not be used as the sole basis for treatment or other patient management decisions. A negative result may occur with  improper specimen collection/handling, submission of specimen other than nasopharyngeal swab, presence of viral mutation(s) within the areas targeted by this assay, and inadequate number of viral copies(<138 copies/mL). A negative result must be combined with clinical observations, patient history, and epidemiological information. The expected result is Negative.  Fact Sheet for Patients:  BloggerCourse.com  Fact Sheet for Healthcare Providers:  SeriousBroker.it  This test is no t yet  approved or cleared by the [place]  FDA and  has been authorized for detection and/or diagnosis of SARS-CoV-2 by FDA under an Emergency Use Authorization (EUA). This EUA will remain  in effect (meaning this test can be used) for the  duration of the COVID-19 declaration under Section 564(b)(1) of the Act, 21 U.S.C.section 360bbb-3(b)(1), unless the authorization is terminated  or revoked sooner.       Influenza A by PCR NEGATIVE NEGATIVE Final   Influenza B by PCR NEGATIVE NEGATIVE Final    Comment: (NOTE) The Xpert Xpress SARS-CoV-2/FLU/RSV plus assay is intended as an aid in the diagnosis of influenza from Nasopharyngeal swab specimens and should not be used as a sole basis for treatment. Nasal washings and aspirates are unacceptable for Xpert Xpress SARS-CoV-2/FLU/RSV testing.  Fact Sheet for Patients: BloggerCourse.com  Fact Sheet for Healthcare Providers: SeriousBroker.it  This test is not yet approved or cleared by the [place]  FDA and has been authorized for detection and/or diagnosis of SARS-CoV-2 by FDA under an Emergency Use Authorization (EUA). This EUA will remain in effect (meaning this test can be used) for the duration of the COVID-19 declaration under Section 564(b)(1) of the Act, 21 U.S.C. section 360bbb-3(b)(1), unless the authorization is terminated or revoked.     Resp Syncytial Virus by PCR NEGATIVE NEGATIVE Final    Comment: (NOTE) Fact Sheet for Patients: BloggerCourse.com  Fact Sheet for Healthcare Providers: SeriousBroker.it  This test is not yet approved or cleared by the [place]  FDA and has been authorized for detection and/or diagnosis of SARS-CoV-2 by FDA under an Emergency Use Authorization (EUA). This EUA will remain in effect (meaning this test can be used) for the duration of the COVID-19 declaration under Section 564(b)(1) of  the Act, 21 U.S.C. section 360bbb-3(b)(1), unless the authorization is terminated or revoked.  Performed at [facility], [address] [place], [place] [address]      Time coordinating discharge: Over 30 minutes  SIGNED:   Seena Dadds, MD  [facility] 06/20/2023, 10:07 AM Pager   If 7PM-7AM, please contact night-coverage [email or web address]

## 2023-06-20 NOTE — Progress Notes (Signed)
 [facility] Progress Note  Subjective:  Seen in room No SOB overnight  Vitals:   06/20/23 0349 06/20/23 0400 06/20/23 0500 06/20/23 0812  BP: 113/67 (!) 119/56  (!) 103/52  Pulse: 67 68  69  Resp: 18 18  20   Temp: 98 F (36.7 C)   (!) 97.3 F (36.3 C)  TempSrc: Oral   Oral  SpO2: 95% 96%  100%  Weight:   80.4 kg   Height:        Exam: Gen alert, no distress, 2 L [name] O2 No jvd or bruits Chest CTA today RRR no MRG Abd soft ntnd no mass or ascites +bs Ext no LE edema Neuro is alert, Ox 3 , nf    RIJ TDC intact    Renal-related home meds: Norvasc  5 at bedtime Lasix  40 mg as needed Hydralazine  25 mg 3 times daily   ESRD: new esrd pt, needs CLIP    Assessment/ Plan: SOB/ orthopnea: pt was having severe orthopnea at home.  Initial CXR +pulm edema. Had 1st HD 5/29. Next HD 5/31 took off 2 L.  Vol overload: stable, looks euvolemic CKD 5/ new ESRD: 2nd HD today. Has TDC in place. No plans for permanent access at this time. Pt has been CLIP[facility] to [facility] at 11:45 am. Pt will start on Monday there.  HTN: on home norvasc  / hydralazine , continue SP TDC R chest: by [facility] 5/29 Dispo: for dc today       Larry Poag MD  [facility] 06/20/2023, 6:46 PM  Recent Labs  Lab 06/18/23 0604 06/19/23 0817  HGB 8.6* 8.9*  ALBUMIN 3.3* 3.4*  CALCIUM  8.9 9.1  PHOS 3.7 4.1  CREATININE 2.51* 3.36*  K 3.7 4.2   No results for input(s): "IRON ", "TIBC", "FERRITIN" in the last 168 hours. Inpatient medications:

## 2023-06-20 NOTE — TOC Transition Note (Signed)
 Transition of Care [facility]) - Discharge Note   Patient Details  Name: Rhonda [facility] MRN: [id number] Date of Birth: 07-15-1945  [facility] of [facility]) CM/SW Contact:  Omie Bickers, RN Phone Number: 06/20/2023, 10:16 AM   Clinical Narrative:     Rise Cheng that patient will DC home today so they can resume services.   Final next level of care: Home w Home Health Services Barriers to Discharge: No Barriers Identified   Patient Goals and CMS Choice            Discharge Placement                       Discharge Plan and Services Additional resources added to the After Visit Summary for                              [facility] Agency: [facility] Date [facility] Agency Contacted: 06/20/23 Time HH Agency Contacted: 1015 Representative spoke with at [facility] Agency: Randel Buss  Social Drivers of Health (SDOH) Interventions SDOH Screenings   Food Insecurity: No Food Insecurity (06/17/2023)  Housing: Low Risk  (06/17/2023)  Transportation Needs: No Transportation Needs (06/17/2023)  Utilities: Not At Risk (06/17/2023)  Depression (PHQ2-9): Low Risk  (06/10/2023)  Social Connections: Unknown (06/17/2023)  Tobacco Use: Low Risk  (06/17/2023)     Readmission Risk Interventions     No data to display

## 2023-06-21 DIAGNOSIS — I5033 Acute on chronic diastolic (congestive) heart failure: Secondary | ICD-10-CM | POA: Diagnosis not present

## 2023-06-21 DIAGNOSIS — I120 Hypertensive chronic kidney disease with stage 5 chronic kidney disease or end stage renal disease: Secondary | ICD-10-CM | POA: Diagnosis not present

## 2023-06-21 DIAGNOSIS — D500 Iron deficiency anemia secondary to blood loss (chronic): Secondary | ICD-10-CM | POA: Diagnosis not present

## 2023-06-21 DIAGNOSIS — R0602 Shortness of breath: Secondary | ICD-10-CM | POA: Diagnosis not present

## 2023-06-21 DIAGNOSIS — N185 Chronic kidney disease, stage 5: Secondary | ICD-10-CM | POA: Diagnosis not present

## 2023-06-21 DIAGNOSIS — N186 End stage renal disease: Secondary | ICD-10-CM | POA: Diagnosis not present

## 2023-06-21 DIAGNOSIS — Z992 Dependence on renal dialysis: Secondary | ICD-10-CM | POA: Diagnosis not present

## 2023-06-21 DIAGNOSIS — E8770 Fluid overload, unspecified: Secondary | ICD-10-CM | POA: Diagnosis not present

## 2023-06-22 ENCOUNTER — Telehealth: Payer: Self-pay | Admitting: Internal Medicine

## 2023-06-22 NOTE — Telephone Encounter (Signed)
 Patient states Dr. Arlester Ladd will be putting in a pacemaker in the bottom chamber of her heart and there was concern about preserving her kidney function and holding off on putting PPM in upper chambers.  Patient states she has had to start dialysis, and would like to know if Dr. Arlester Ladd felt it was safe to go ahead and place pacemaker in both upper and bottom chambers of her heart.  We will follow-up with response from Dr. Arlester Ladd.  Please call patient on cellphone: [phone]. If she does not answer, keep trying.

## 2023-06-22 NOTE — Telephone Encounter (Signed)
 Patient calling to speak with the nurse, asking of a call back on her sell. Please advise

## 2023-06-23 ENCOUNTER — Telehealth: Payer: Self-pay | Admitting: Internal Medicine

## 2023-06-23 MED ORDER — EZETIMIBE 10 MG PO TABS: 10.0000 mg | ORAL_TABLET | Freq: Every day | ORAL | 3 refills | Status: DC

## 2023-06-23 NOTE — Telephone Encounter (Signed)
 Will forward to Dr Lorie Rook for review

## 2023-06-23 NOTE — Telephone Encounter (Signed)
 Thukkani, Arun K, MD to Me  Lowell Rude, RN   06/23/23  1:52 PM Since she is on dialysis, we should stop any statins.  Start zetia 10.  Advised patient, verbalized understanding

## 2023-06-23 NOTE — Telephone Encounter (Signed)
  Pt c/o medication issue:  1. Name of Medication: aspirin  EC 81 MG tablet   atorvastatin  (LIPITOR) 40 MG tablet    2. How are you currently taking this medication (dosage and times per day)? As written   3. Are you having a reaction (difficulty breathing--STAT)? No   4. What is your medication issue? The patient would like to ask Dr. Lorie Rook if she still needs to take aspirin  since she is on dialysis. She also wants to inquire about alternative medications to Lipitor, as she enjoys eating grapefruit and wants to continue doing so, but understands that grapefruit should be avoided while taking Lipitor. Please call [phone] if she unable to answer, pt said to keep trying

## 2023-06-25 DIAGNOSIS — E785 Hyperlipidemia, unspecified: Secondary | ICD-10-CM | POA: Diagnosis not present

## 2023-06-25 DIAGNOSIS — H35069 Retinal vasculitis, unspecified eye: Secondary | ICD-10-CM | POA: Diagnosis not present

## 2023-06-25 DIAGNOSIS — I495 Sick sinus syndrome: Secondary | ICD-10-CM | POA: Diagnosis not present

## 2023-06-25 DIAGNOSIS — R0789 Other chest pain: Secondary | ICD-10-CM | POA: Diagnosis not present

## 2023-06-25 DIAGNOSIS — I5030 Unspecified diastolic (congestive) heart failure: Secondary | ICD-10-CM | POA: Diagnosis not present

## 2023-06-25 DIAGNOSIS — N186 End stage renal disease: Secondary | ICD-10-CM | POA: Diagnosis not present

## 2023-06-25 DIAGNOSIS — I130 Hypertensive heart and chronic kidney disease with heart failure and stage 1 through stage 4 chronic kidney disease, or unspecified chronic kidney disease: Secondary | ICD-10-CM | POA: Diagnosis not present

## 2023-06-25 DIAGNOSIS — R399 Unspecified symptoms and signs involving the genitourinary system: Secondary | ICD-10-CM | POA: Diagnosis not present

## 2023-06-25 DIAGNOSIS — E039 Hypothyroidism, unspecified: Secondary | ICD-10-CM | POA: Diagnosis not present

## 2023-06-25 DIAGNOSIS — E113553 Type 2 diabetes mellitus with stable proliferative diabetic retinopathy, bilateral: Secondary | ICD-10-CM | POA: Diagnosis not present

## 2023-06-25 DIAGNOSIS — Z992 Dependence on renal dialysis: Secondary | ICD-10-CM | POA: Diagnosis not present

## 2023-06-25 DIAGNOSIS — E1122 Type 2 diabetes mellitus with diabetic chronic kidney disease: Secondary | ICD-10-CM | POA: Diagnosis not present

## 2023-06-28 ENCOUNTER — Telehealth: Payer: Self-pay | Admitting: Cardiovascular Disease

## 2023-06-28 DIAGNOSIS — E8770 Fluid overload, unspecified: Secondary | ICD-10-CM | POA: Diagnosis not present

## 2023-06-28 DIAGNOSIS — T8249XA Other complication of vascular dialysis catheter, initial encounter: Secondary | ICD-10-CM | POA: Diagnosis not present

## 2023-06-28 DIAGNOSIS — T80219A Unspecified infection due to central venous catheter, initial encounter: Secondary | ICD-10-CM | POA: Diagnosis not present

## 2023-06-28 DIAGNOSIS — Z992 Dependence on renal dialysis: Secondary | ICD-10-CM | POA: Diagnosis not present

## 2023-06-28 DIAGNOSIS — D500 Iron deficiency anemia secondary to blood loss (chronic): Secondary | ICD-10-CM | POA: Diagnosis not present

## 2023-06-28 DIAGNOSIS — N186 End stage renal disease: Secondary | ICD-10-CM | POA: Diagnosis not present

## 2023-06-28 NOTE — Telephone Encounter (Signed)
 Call to patient to clarify what her needs are with her port in her neck. Patient had a dialysis catheter placed during hospital admission 06/17/23. Since this admission she has been discharged and has started outpatient dialysis on Monday, Tuesday, Thursday, and Friday. She is scheduled for a pacemaker insertion on 07/08/23. Patient states she was given antiseptic prep for pacemaker insertion and instructed on how to clean prior to pacemaker insertion but states she is unable to shower while she has temporary dialysis catheter. Patient requests call back with advice about how to complete preparation for pacemaker prep.

## 2023-06-28 NOTE — Telephone Encounter (Signed)
 Patient stated she has a port in her neck and is unable to shower with antiseptic soap.  Patient wants call back for alternative cleaning method.  Patient noted she does dialysis and will need a call back before 11:00 am or after 4:00 pm.

## 2023-06-30 ENCOUNTER — Telehealth: Payer: Self-pay

## 2023-06-30 DIAGNOSIS — E1122 Type 2 diabetes mellitus with diabetic chronic kidney disease: Secondary | ICD-10-CM | POA: Diagnosis not present

## 2023-06-30 DIAGNOSIS — N186 End stage renal disease: Secondary | ICD-10-CM | POA: Diagnosis not present

## 2023-06-30 DIAGNOSIS — D631 Anemia in chronic kidney disease: Secondary | ICD-10-CM | POA: Diagnosis not present

## 2023-06-30 DIAGNOSIS — Z952 Presence of prosthetic heart valve: Secondary | ICD-10-CM | POA: Diagnosis not present

## 2023-06-30 DIAGNOSIS — N8110 Cystocele, unspecified: Secondary | ICD-10-CM | POA: Diagnosis not present

## 2023-06-30 DIAGNOSIS — I452 Bifascicular block: Secondary | ICD-10-CM | POA: Diagnosis not present

## 2023-06-30 DIAGNOSIS — I5043 Acute on chronic combined systolic (congestive) and diastolic (congestive) heart failure: Secondary | ICD-10-CM | POA: Diagnosis not present

## 2023-06-30 DIAGNOSIS — N816 Rectocele: Secondary | ICD-10-CM | POA: Diagnosis not present

## 2023-06-30 DIAGNOSIS — K222 Esophageal obstruction: Secondary | ICD-10-CM | POA: Diagnosis not present

## 2023-06-30 DIAGNOSIS — H4312 Vitreous hemorrhage, left eye: Secondary | ICD-10-CM | POA: Diagnosis not present

## 2023-06-30 DIAGNOSIS — Z794 Long term (current) use of insulin: Secondary | ICD-10-CM | POA: Diagnosis not present

## 2023-06-30 DIAGNOSIS — I453 Trifascicular block: Secondary | ICD-10-CM | POA: Diagnosis not present

## 2023-06-30 DIAGNOSIS — E876 Hypokalemia: Secondary | ICD-10-CM | POA: Diagnosis not present

## 2023-06-30 DIAGNOSIS — I495 Sick sinus syndrome: Secondary | ICD-10-CM | POA: Diagnosis not present

## 2023-06-30 DIAGNOSIS — Z7982 Long term (current) use of aspirin: Secondary | ICD-10-CM | POA: Diagnosis not present

## 2023-06-30 DIAGNOSIS — Z9181 History of falling: Secondary | ICD-10-CM | POA: Diagnosis not present

## 2023-06-30 DIAGNOSIS — N185 Chronic kidney disease, stage 5: Secondary | ICD-10-CM | POA: Diagnosis not present

## 2023-06-30 DIAGNOSIS — N393 Stress incontinence (female) (male): Secondary | ICD-10-CM | POA: Diagnosis not present

## 2023-06-30 DIAGNOSIS — I2510 Atherosclerotic heart disease of native coronary artery without angina pectoris: Secondary | ICD-10-CM | POA: Diagnosis not present

## 2023-06-30 DIAGNOSIS — I214 Non-ST elevation (NSTEMI) myocardial infarction: Secondary | ICD-10-CM | POA: Diagnosis not present

## 2023-06-30 DIAGNOSIS — Z992 Dependence on renal dialysis: Secondary | ICD-10-CM | POA: Diagnosis not present

## 2023-06-30 DIAGNOSIS — I455 Other specified heart block: Secondary | ICD-10-CM | POA: Diagnosis not present

## 2023-06-30 DIAGNOSIS — I350 Nonrheumatic aortic (valve) stenosis: Secondary | ICD-10-CM | POA: Diagnosis not present

## 2023-06-30 DIAGNOSIS — Z950 Presence of cardiac pacemaker: Secondary | ICD-10-CM | POA: Diagnosis not present

## 2023-06-30 DIAGNOSIS — Z48812 Encounter for surgical aftercare following surgery on the circulatory system: Secondary | ICD-10-CM | POA: Diagnosis not present

## 2023-06-30 DIAGNOSIS — I132 Hypertensive heart and chronic kidney disease with heart failure and with stage 5 chronic kidney disease, or end stage renal disease: Secondary | ICD-10-CM | POA: Diagnosis not present

## 2023-06-30 DIAGNOSIS — I872 Venous insufficiency (chronic) (peripheral): Secondary | ICD-10-CM | POA: Diagnosis not present

## 2023-06-30 DIAGNOSIS — E785 Hyperlipidemia, unspecified: Secondary | ICD-10-CM | POA: Diagnosis not present

## 2023-06-30 DIAGNOSIS — Z981 Arthrodesis status: Secondary | ICD-10-CM | POA: Diagnosis not present

## 2023-06-30 DIAGNOSIS — I440 Atrioventricular block, first degree: Secondary | ICD-10-CM | POA: Diagnosis not present

## 2023-06-30 DIAGNOSIS — E039 Hypothyroidism, unspecified: Secondary | ICD-10-CM | POA: Diagnosis not present

## 2023-06-30 DIAGNOSIS — I3481 Nonrheumatic mitral (valve) annulus calcification: Secondary | ICD-10-CM | POA: Diagnosis not present

## 2023-06-30 DIAGNOSIS — I5042 Chronic combined systolic (congestive) and diastolic (congestive) heart failure: Secondary | ICD-10-CM | POA: Diagnosis not present

## 2023-06-30 DIAGNOSIS — I340 Nonrheumatic mitral (valve) insufficiency: Secondary | ICD-10-CM | POA: Diagnosis not present

## 2023-06-30 DIAGNOSIS — I951 Orthostatic hypotension: Secondary | ICD-10-CM | POA: Diagnosis not present

## 2023-06-30 NOTE — Telephone Encounter (Signed)
 Spoke with patient, appointment with Dr Arlester Ladd scheduled for Monday, June 16 at 10:15 - leadless pacemaker cancelled for now.

## 2023-07-04 NOTE — Progress Notes (Unsigned)
 Electrophysiology Office Note:    Date:  07/05/2023   ID:  Rhonda [facility], DOB June 22, 1945, MRN [id number]  PCP:  Lonzie Robins, MD   [facility] Providers Cardiologist:  Kyra Phy, MD Electrophysiologist:  Lei Pump, MD     Referring MD: Lonzie Robins, MD   History of Present Illness:    Rhonda Clayton is a 78 y.o. female with a medical history significant for sick sinus syndrome, aortic stenosis, stage IV CKD, atherosclerotic vascular disease referred for leadless pacemaker placement.     She saw Dr. Lawana Pray with sinus bradycardia and occasional pauses. The pauses occurred in the AM, usually between 7 and 9 AM.  She reports that she is usually awake at these hours.  She notes 1 episode of fatigue associated with heart rates of 40 bpm.  Unfortunately she did not notate the time on her diary, so we do not know the exact rhythm at the time of her event.  She was hospitalized in late May 2025 with acute on chronic heart failure.  Dialysis was initiated for volume management.  During the hospitalization she did not appear to be bradycardic.      Today, she reports that she is at baseline.  She has occasional fatigue.  No syncope, no presyncope.  EKGs/Labs/Other Studies Reviewed Today:     Echocardiogram:  TTE 04/08/2023 EF 55-60%.  Grade 2 diastolic dysfunction.  Left atrium is mildly dilated. Degenerative mitral valve, no mitral stenosis.  Severe calcification of the aortic valve with severe low-flow low gradient aortic valve stenosis.. Left atrium is mildly dilated.   Monitors:  11 day monitor March, 2025  -- my interpretation Sinus rhythm, HR 33-90, avg 60 1.1% burden of atrial ectopy Rare ventricular ectopy, no episodes of bigeminy Multiple sinus pauses occurred, the longer was 4.7 seconds. These occurred in the AM and over night, as late as about 9 AM or so.  There was a symptom episode of fatigue associated with hr in the 40's (per  patient), but we do not have associated strips because the date/time of the symptom was not specified.   EKG:   EKG Interpretation Date/Time:  Monday July 05 2023 10:13:24 EDT Ventricular Rate:  72 PR Interval:  220 QRS Duration:  152 QT Interval:  454 QTC Calculation: 497 R Axis:   -63  Text Interpretation: Sinus rhythm with 1st degree A-V block Right bundle branch block Left anterior fascicular block Bifascicular block Left ventricular hypertrophy with repolarization abnormality ( R in aVL ) When compared with ECG of 14-Jun-2023 08:47, No significant change was found Confirmed by Marlane Silver [phone]) on 07/05/2023 10:34:50 AM     Physical Exam:    VS:  BP (!) 150/68 (BP Location: Right Arm, Patient Position: Sitting)   Pulse 72   Ht 5' 1 (1.549 m)   Wt 179 lb 9.6 oz (81.5 kg)   SpO2 97%   BMI 33.94 kg/m     Wt Readings from Last 3 Encounters:  07/05/23 179 lb 9.6 oz (81.5 kg)  06/20/23 177 lb 4 oz (80.4 kg)  06/14/23 186 lb 14.4 oz (84.8 kg)     GEN: Well nourished, well developed in no acute distress CARDIAC: RRR, no murmurs, rubs, gallops RESPIRATORY:  Normal work of breathing MUSCULOSKELETAL: no edema    ASSESSMENT & PLAN:     Bradycardia She has had both sinus pauses, which occurred in the a.m. No symptoms were documented at this time.  She has had  episodes of low pulse rates associated with fatigue. She has trifascicular block with RBBB, LAFB, and prolonged AV conduction With risk for complete AV block given her significant AV conduction disease and monitor findings of paroxysmal sinus pauses, a Medtronic Micra AV may be the best option to give her ventricular backup pacing At present, the indication for pacing is relative, primarily for back-up in the event of progression of AV conduction disease. I think the risk of device placement would outweigh the benefit given her comorbidities. However, if she is deemed a candidate for TAVR, we would likely need  to coordinate placement of a leadless pacemaker with the TAVR procedure. I would typically employ general anesthesia for leadless device placement but could consider placement of a micra AV with conscious sedation.  Right bundle branch block I anticipate she would be at risk for progression of heart block with TAVR  ESRD, now on dialysis Followed by nephrology With plans in process for obtaining dialysis access Of some concern is that placement of a leadless pacemaker would require contrast injection, and the amount is not always trivial with the dual-chamber leadless system  Hypertension BP above goal      Signed, Efraim Grange, MD  07/05/2023 12:18 PM    [facility]

## 2023-07-05 ENCOUNTER — Encounter: Payer: Self-pay | Admitting: Cardiovascular Disease

## 2023-07-05 ENCOUNTER — Ambulatory Visit: Attending: Cardiovascular Disease | Admitting: Cardiovascular Disease

## 2023-07-05 VITALS — BP 150/68 | HR 72 | Ht 61.0 in | Wt 179.6 lb

## 2023-07-05 DIAGNOSIS — E8770 Fluid overload, unspecified: Secondary | ICD-10-CM | POA: Diagnosis not present

## 2023-07-05 DIAGNOSIS — E1169 Type 2 diabetes mellitus with other specified complication: Secondary | ICD-10-CM | POA: Diagnosis not present

## 2023-07-05 DIAGNOSIS — R001 Bradycardia, unspecified: Secondary | ICD-10-CM | POA: Diagnosis not present

## 2023-07-05 DIAGNOSIS — N186 End stage renal disease: Secondary | ICD-10-CM | POA: Diagnosis not present

## 2023-07-05 DIAGNOSIS — E785 Hyperlipidemia, unspecified: Secondary | ICD-10-CM

## 2023-07-05 DIAGNOSIS — Z992 Dependence on renal dialysis: Secondary | ICD-10-CM | POA: Diagnosis not present

## 2023-07-05 DIAGNOSIS — D500 Iron deficiency anemia secondary to blood loss (chronic): Secondary | ICD-10-CM | POA: Diagnosis not present

## 2023-07-05 DIAGNOSIS — D631 Anemia in chronic kidney disease: Secondary | ICD-10-CM | POA: Diagnosis not present

## 2023-07-05 NOTE — Patient Instructions (Signed)
 Medication Instructions:  Your physician recommends that you continue on your current medications as directed. Please refer to the Current Medication list given to you today. *If you need a refill on your cardiac medications before your next appointment, please call your pharmacy*   Follow-Up: At [facility], you and your health needs are our priority.  As part of our continuing mission to provide you with exceptional heart care, our providers are all part of one team.  This team includes your primary Cardiologist (physician) and Advanced Practice Providers or APPs (Physician Assistants and Nurse Practitioners) who all work together to provide you with the care you need, when you need it.  Your next appointment:   Please keep your next appointment on Monday, June 23  Provider:   Arun K Thukkani, MD

## 2023-07-08 ENCOUNTER — Ambulatory Visit (HOSPITAL_COMMUNITY): Admission: RE | Admit: 2023-07-08 | Source: Home / Self Care | Admitting: Cardiovascular Disease

## 2023-07-08 ENCOUNTER — Encounter (HOSPITAL_COMMUNITY): Admission: RE | Payer: Self-pay | Source: Home / Self Care

## 2023-07-08 SURGERY — PACEMAKER LEADLESS INSERTION

## 2023-07-09 NOTE — H&P (View-Only) (Signed)
 Cardiology Office Note:   Date:  07/12/2023  ID:  Rhonda Clayton, DOB Jul 29, 1945, MRN [id number] PCP:  Avva, Ravisankar, MD  [facility] Providers Cardiologist:  Wendel Haws, MD Referring MD: Avva, Ravisankar, MD  Chief Complaint/Reason for Referral: Bradycardia ASSESSMENT:    1. Nonrheumatic aortic valve stenosis   2. Type 2 diabetes mellitus with complication, with long-term current use of insulin  (HCC)   3. Hyperlipidemia associated with type 2 diabetes mellitus (HCC)   4. Aortic atherosclerosis (HCC)   5. Hypertension associated with diabetes (HCC)   6. End stage renal disease (HCC)   7. Cardiomyopathy, unspecified type (HCC)   8. Trifascicular block   9. Abnormal stress test       PLAN:   In order of problems listed above: Aortic stenosis:   Her echocardiogram seems more consistent with low-flow low gradient aortic stenosis with a new cardiomyopathy rather than moderate aortic stenosis.  Her dimensionless index is close to 0.25.  I had a long conversation with the patient and her family.  I did tell her that given her advanced age, cardiomyopathy, end-stage renal disease, and aortic valvular disease that her life expectancy is greatly shortened.  The biggest issue in my mind seems to be her ability to tolerate dialysis.  She has already had to stop some of her dialysis sessions because of low blood pressures.  After long conversation with the patient and her family including potentially the need for a permanent pacemaker following the procedure given her trifascicular block, the patient would like to pursue transcatheter valve replacement to improve her quality of life and allow her to tolerate dialysis for longer period of time.  I did speak to Dr. Nancey.  If she were to need a permanent pacemaker after the procedure then he would pursue a leadless pacemaker.  I will refer the patient for coronary angiography only.  Will defer right heart catheterization given trifascicular  block.  Will obtain a CT scan as well and refer for surgical opinion.  She is no longer candidate for the moderate aortic stenosis continuing access program given significant kidney disease.  She desperately wants to live by herself and be independent.  She does not like to depend on her family.  I think for this reason it makes sense to pursue a valve intervention even considering her end-stage renal disease and risk require permanent pacemaker.  Hopefully her CT scan will show that her membranous septum length is somewhat long.  We will pursue right IJ pacing and a SAPIEN prosthesis if TAVR is to be pursued. Type 2 diabetes mellitus: Continue Plavix 75 mg, Zetia  10 mg; patient defers statins and non-statin meds.  Defer ARB and Jardiance for now given ESRD. Hyperlipidemia: LDL 99; continue Zetia  10 mg.  Patient met with pharmacy about nonstatin options and she was not interested. Aortic atherosclerosis: Continue Plavix 75 mg and Zetia  10 mg. Hypertension: Off BP meds due to low BP. End-stage renal disease: Currently on hemodialysis but develops low blood pressures at times during dialysis sessions. Chronic systolic heart failure: Will refer for coronary angiography to evaluate cardiomyopathy. Trifascicular block: Seen by EP and pacemaker deferred at this point depending on whether an aortic valve intervention is pursued.  Would pursue leadless pacemaker. Precordial pain: High risk PET stress test done recently; will pursue coronary angiography as above.      I spent 40 minutes reviewing all clinical data during and prior to this visit including all relevant imaging studies, laboratories, clinical information  from other health systems and prior notes from both Cardiology and other specialties, interviewing the patient, conducting a complete physical examination, and coordinating care in order to formulate a comprehensive and personalized evaluation and treatment plan.   Dispo:  No follow-ups on file.       Medication Adjustments/Labs and Tests Ordered: Current medicines are reviewed at length with the patient today.  Concerns regarding medicines are outlined above.  The following changes have been made:  no change   Labs/tests ordered: No orders of the defined types were placed in this encounter.   Medication Changes: No orders of the defined types were placed in this encounter.   Current medicines are reviewed at length with the patient today.  The patient does not have concerns regarding medicines.     History of Present Illness:      FOCUSED PROBLEM LIST:   Aortic stenosis AVA 1.1, MG 21, V-max 3.2, DI. 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  At that visit her amlodipine  was decreased to 2.5 mg given this heart rate issue.  She was referred for further recommendations.  The patient is EKG today demonstrates sinus rhythm with bifascicular block.  She tells me that she feels pretty fatigued and out of energy at many times.  She has been getting over pneumonia that she contracted in November.  She still feels a chest burning at times.  Sometimes this is associated with exertion.  She occasionally feels chest fluttering.  Fortunately she has not  developed any presyncope or syncope.  She has had no severe bleeding or bruising.  She is tolerating her medications well without any issues.  She does have evidence of aortic atherosclerosis on a prior CT scan and when asked about potentially starting a statin the patient is somewhat reluctant due to the potential brain fog.  Plan: Start atorvastatin  20 mg, obtain monitor and echocardiogram, start Plavix 75 mg refer to pharmacy for GLP-1 receptor agonist therapy, obtain PET stress test to evaluate for ischemia.  March 2025:  Patient consents to use of AI scribe. Her monitor was also significant for pauses during waking hours and she was referred to EP.  Echocardiogram demonstrated moderate to severe aortic stenosis as well.  She is here to discuss those findings due to ongoing dyspnea.    She experiences fatigue and shortness of breath, which have progressively impacted her daily activities. She used to walk two miles but can no longer do so. Tasks such as vacuuming and pulling trash cans are difficult, sometimes causing a burning sensation in her chest, though she does not describe this as pain. No chest pain, lightheadedness, or swelling in her legs.  She reports poor sleep quality at night, often waking up frequently, which leads her  to nap during the day. Her heart rate was noted to be low on March 2nd, when she felt particularly low energy.  Plan: Obtain PET stress test.  June 2025:  Patient consents to use of AI scribe. In the interim the patient's PET stress test demonstrated high risk findings.  She contacted our office with more shortness of breath and was admitted on May 24.  Her BNP was elevated, her creatinine was 3.2, and her troponins were about 500.  Her chest x-ray demonstrated pulmonary edema.  She was given IV Lasix .  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  She ended up having a tunneled  dialysis catheter placed and dialysis was initiated.  She did see EP earlier this month due to pauses seen on a monitor.  These seem to occur when she was awake.  A pacemaker was considered but was deferred at that time given her underlying renal disease and other comorbidities.  She has since started dialysis, attending sessions four days a week. During dialysis, her blood pressure occasionally drops, necessitating temporary cessation of the session. Despite this, she feels better overall, with improved breathing and mobility. She describes a sensation of 'drowning' during the stress test and subsequent difficulty breathing, which has improved since fluid removal during dialysis. She can now lay flat during tests, although she prefers not to at home. She occasionally experiences chest discomfort, which she attributes to indigestion, particularly after consuming certain foods. No chest discomfort during walking or any blacking out spells since her hospital discharge. Swelling in her legs has improved.  On days she takes Lasix , she experiences a sensation of vision changes and heat rising up the back of her neck. She takes 40 mg of Lasix  on non-dialysis days. She previously tried torsemide but could not tolerate it due to adverse effects.  She is currently staying with her sister but wants to live independently in her own home     Current Medications: Current Meds  Medication Sig   aspirin  EC 81 MG tablet Take 1 tablet (81 mg total) by mouth daily. Swallow whole.   B-D UF III MINI PEN NEEDLES 31G X 5 MM MISC Inject into the skin 2 (two) times daily.   betamethasone valerate (VALISONE) 0.1 % cream Apply 1 Application topically as needed (skin condition).   Continuous Glucose Sensor (FREESTYLE LIBRE 3 SENSOR) MISC change every 14 days topically to monitor blood glucose continuously 28 days   Folic Acid -Vit B6-Vit B12 (FOLBEE) 2.5-25-1 MG TABS tablet Take 1 tablet by mouth daily.   furosemide  (LASIX ) 40  MG tablet Take 1 tablet by mouth. Pt takes lasix  wed,sat,sundays   Lancets (ONETOUCH DELICA PLUS LANCET33G) MISC Apply topically 3 (three) times daily.   levothyroxine  (SYNTHROID ) 100 MCG tablet Take 100 mcg by mouth See admin instructions. Takes 1 tablet daily except for Monday and Friday she takes 0.5 tablet   nitroGLYCERIN  (NITROSTAT ) 0.4 MG SL tablet Place 1 tablet (0.4 mg total) under the tongue every 5 (five) minutes as needed for chest pain.   NOVOLOG  MIX 70/30 FLEXPEN (70-30) 100 UNIT/ML FlexPen Inject into the skin in the morning and at bedtime. Takes 15 units in the morning and 9-10 units at bedtime   ONETOUCH VERIO test strip 3 (three) times daily.   pantoprazole  (PROTONIX ) 40 MG tablet Take 1.5 tablets by mouth daily.     Review of Systems:   Please see the history of  present illness.    All other systems reviewed and are negative.     EKGs/Labs/Other Test Reviewed:   EKG: June 2025 sinus rhythm, first-degree AV block, right bundle branch block, and left anterior fascicular block  EKG Interpretation Date/Time:    Ventricular Rate:    PR Interval:    QRS Duration:    QT Interval:    QTC Calculation:   R Axis:      Text Interpretation:           Risk Assessment/Calculations:          Physical Exam:   VS:  BP (!) 98/52   Pulse 64   Ht 5' 1 (1.549 m)   Wt 173 lb (78.5 kg)   SpO2 97%   BMI 32.69 kg/m        Wt Readings from Last 3 Encounters:  07/12/23 173 lb (78.5 kg)  07/05/23 179 lb 9.6 oz (81.5 kg)  06/20/23 177 lb 4 oz (80.4 kg)      GENERAL:  No apparent distress, AOx3 HEENT:  No carotid bruits, +2 carotid impulses, no scleral icterus CAR: RRR 2/6 SEM, gallops, rubs, or thrills RES:  Clear to auscultation bilaterally ABD:  Soft, nontender, nondistended, positive bowel sounds x 4 VASC:  +2 radial pulses, +2 carotid pulses NEURO:  CN 2-12 grossly intact; motor and sensory grossly intact PSYCH:  No active depression or anxiety EXT:  No edema,  ecchymosis, or cyanosis  Signed, Kasyn Rolph K Harriet Sutphen, MD  07/12/2023 2:19 PM    [facility] [address] [place], [place], [place]  [address] Phone: [phone]; Fax: [phone]   Note:  This document was prepared using Dragon voice recognition software and may include unintentional dictation errors.

## 2023-07-09 NOTE — Progress Notes (Unsigned)
 Cardiology Office Note:   Date:  07/09/2023  ID:  Rhonda Clayton, DOB November 04, 1945, MRN [id number] PCP:  Avva, Ravisankar, MD  [facility] Providers Cardiologist:  Alyssa Backbone, MD Referring MD: Avva, Ravisankar, MD  Chief Complaint/Reason for Referral: Bradycardia ASSESSMENT:    1. Nonrheumatic aortic valve stenosis   2. Type 2 diabetes mellitus with complication, with long-term current use of insulin  (HCC)   3. Hyperlipidemia associated with type 2 diabetes mellitus (HCC)   4. Aortic atherosclerosis (HCC)   5. Hypertension associated with diabetes (HCC)   6. End stage renal disease (HCC)   7. Cardiomyopathy, unspecified type (HCC)   8. Trifascicular block   9. Abnormal stress test       PLAN:   In order of problems listed above: Aortic stenosis: *** Type 2 diabetes mellitus: Continue Plavix 75 mg, Zetia  10 mg g.  Defer ARB and Jardiance for now.  Hyperlipidemia: LDL 99; continue Zetia  10 mg.  Patient met with pharmacy about nonstatin options and she was not interested. Aortic atherosclerosis: Continue Plavix 75 mg and Zetia  10 mg. Hypertension: Amlodipine  10 mg, hydralazine  25 mg 3 times daily.*** End-stage renal disease: *** Chronic systolic heart failure: Cath?  *** Trifascicular block: Seen by EP and pacemaker deferred at this point depending on whether an aortic valve intervention is pursued. Precordial pain: High risk PET stress test done recently; cath has not yet been pursued as patient had not yet started dialysis.            Dispo:  No follow-ups on file.      Medication Adjustments/Labs and Tests Ordered: Current medicines are reviewed at length with the patient today.  Concerns regarding medicines are outlined above.  The following changes have been made:  no change   Labs/tests ordered: No orders of the defined types were placed in this encounter.   Medication Changes: No orders of the defined types were placed in this encounter.   Current  medicines are reviewed at length with the patient today.  The patient does not have concerns regarding medicines.     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  At that visit her amlodipine  was decreased to 2.5 mg given this heart rate issue.  She was referred for further recommendations.  The patient is EKG today demonstrates sinus rhythm with bifascicular block.  She tells me that she feels pretty fatigued and out of energy at many times.  She has been getting over pneumonia that she contracted in November.  She still feels a chest burning at times.  Sometimes this is associated with exertion.  She occasionally feels chest fluttering.  Fortunately she has not  developed any presyncope or syncope.  She has had no severe bleeding or bruising.  She is tolerating her medications well without any issues.  She does have evidence of aortic atherosclerosis on a prior CT scan and when asked about potentially starting a statin the patient is somewhat reluctant due to the potential brain fog.  Plan: Start atorvastatin  20 mg, obtain monitor and echocardiogram, start Plavix 75 mg refer to pharmacy for GLP-1 receptor agonist  therapy, obtain PET stress test to evaluate for ischemia.  March 2025:  Patient consents to use of AI scribe. Her monitor was also significant for pauses during waking hours and she was referred to EP.  Echocardiogram demonstrated moderate to severe aortic stenosis as well.  She is here to discuss those findings due to ongoing dyspnea.    She experiences fatigue and shortness of breath, which have progressively impacted her daily activities. She used to walk two miles but can no longer do so. Tasks such as vacuuming and pulling trash cans are difficult, sometimes causing a burning sensation in her chest, though she does not describe this as pain. No chest pain, lightheadedness, or swelling in her legs.  She reports poor sleep quality at night, often waking up frequently, which leads her to nap during the day. Her heart rate was noted to be low on March 2nd, when she felt particularly low energy.  Plan: Obtain PET stress test.  June 2025:  Patient consents to use of AI scribe. In the interim the patient's PET stress test demonstrated high risk findings.  She contacted our office with more shortness of breath and was admitted on May 24.  Her BNP was elevated her creatinine was 3.2, and her troponins were about 500.  Her chest x-ray demonstrated pulmonary edema.  He was given IV Lasix .  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  She ended up having a tunneled dialysis catheter placed and dialysis was initiated.  She did see EP earlier this month due to pauses seen on a monitor.  These seem to occur when she was awake.  A pacemaker was considered but was deferred at that time given her underlying renal disease and other comorbidities.     Current Medications: No outpatient medications have been marked as taking for the 07/12/23 encounter (Appointment) with Camdan Burdi K, MD.     Review of Systems:   Please  see the history of present illness.    All other systems reviewed and are negative.     EKGs/Labs/Other Test Reviewed:   EKG: June 2025 sinus rhythm, first-degree AV block, right bundle branch block, and left anterior fascicular block  EKG Interpretation Date/Time:    Ventricular Rate:    PR Interval:    QRS Duration:    QT Interval:    QTC Calculation:   R Axis:      Text Interpretation:           Risk Assessment/Calculations:          Physical Exam:   VS:  There were no vitals taken for this visit.   No BP recorded.  {Refresh Note OR Click here to enter BP  :1}***   Wt Readings from Last 3 Encounters:  07/05/23 179 lb 9.6 oz (81.5 kg)  06/20/23 177 lb 4 oz (80.4 kg)  06/14/23 186 lb 14.4 oz (84.8 kg)      GENERAL:  No apparent distress, AOx3 HEENT:  No carotid bruits, +2 carotid impulses, no scleral icterus CAR: RRR 2/6 SEM, gallops, rubs, or thrills RES:  Clear to auscultation bilaterally ABD:  Soft, nontender, nondistended, positive bowel sounds x 4 VASC:  +2 radial pulses, +2 carotid pulses NEURO:  CN 2-12 grossly intact; motor and sensory grossly intact PSYCH:  No active depression or anxiety EXT:  No edema, ecchymosis, or cyanosis  Signed, Kyra Phy, MD  07/09/2023 10:41 AM    [facility] [address] [place], [place], [place]  [address] Phone: [phone]; Fax: [phone]   Note:  This document was prepared using Dragon voice recognition software and may include unintentional dictation errors.

## 2023-07-12 ENCOUNTER — Ambulatory Visit: Attending: Internal Medicine | Admitting: Internal Medicine

## 2023-07-12 ENCOUNTER — Encounter: Payer: Self-pay | Admitting: Internal Medicine

## 2023-07-12 VITALS — BP 98/52 | HR 64 | Ht 61.0 in | Wt 173.0 lb

## 2023-07-12 DIAGNOSIS — I429 Cardiomyopathy, unspecified: Secondary | ICD-10-CM

## 2023-07-12 DIAGNOSIS — D500 Iron deficiency anemia secondary to blood loss (chronic): Secondary | ICD-10-CM | POA: Diagnosis not present

## 2023-07-12 DIAGNOSIS — E1169 Type 2 diabetes mellitus with other specified complication: Secondary | ICD-10-CM

## 2023-07-12 DIAGNOSIS — E8770 Fluid overload, unspecified: Secondary | ICD-10-CM | POA: Diagnosis not present

## 2023-07-12 DIAGNOSIS — E118 Type 2 diabetes mellitus with unspecified complications: Secondary | ICD-10-CM

## 2023-07-12 DIAGNOSIS — Z794 Long term (current) use of insulin: Secondary | ICD-10-CM

## 2023-07-12 DIAGNOSIS — E785 Hyperlipidemia, unspecified: Secondary | ICD-10-CM | POA: Diagnosis not present

## 2023-07-12 DIAGNOSIS — I453 Trifascicular block: Secondary | ICD-10-CM

## 2023-07-12 DIAGNOSIS — R9439 Abnormal result of other cardiovascular function study: Secondary | ICD-10-CM

## 2023-07-12 DIAGNOSIS — Z992 Dependence on renal dialysis: Secondary | ICD-10-CM | POA: Diagnosis not present

## 2023-07-12 DIAGNOSIS — I152 Hypertension secondary to endocrine disorders: Secondary | ICD-10-CM | POA: Diagnosis not present

## 2023-07-12 DIAGNOSIS — I700 Atherosclerosis of aorta: Secondary | ICD-10-CM | POA: Diagnosis not present

## 2023-07-12 DIAGNOSIS — I350 Nonrheumatic aortic (valve) stenosis: Secondary | ICD-10-CM | POA: Diagnosis not present

## 2023-07-12 DIAGNOSIS — N186 End stage renal disease: Secondary | ICD-10-CM | POA: Diagnosis not present

## 2023-07-12 DIAGNOSIS — E1159 Type 2 diabetes mellitus with other circulatory complications: Secondary | ICD-10-CM | POA: Diagnosis not present

## 2023-07-12 DIAGNOSIS — D631 Anemia in chronic kidney disease: Secondary | ICD-10-CM | POA: Diagnosis not present

## 2023-07-12 NOTE — Addendum Note (Signed)
 Addended by: Triston Skare on: 07/12/2023 03:42 PM   Modules accepted: Orders

## 2023-07-12 NOTE — Progress Notes (Signed)
 Pre Surgical Assessment: 5 M Walk Test  41M=16.45ft  5 Meter Walk Test- trial 1: 7.12 seconds 5 Meter Walk Test- trial 2: 7.31 seconds 5 Meter Walk Test- trial 3: 7.21 seconds 5 Meter Walk Test Average: 7.21 seconds

## 2023-07-12 NOTE — Patient Instructions (Signed)
 Medication Instructions:  No changes *If you need a refill on your cardiac medications before your next appointment, please call your pharmacy*  Lab Work: none If you have labs (blood work) drawn today and your tests are completely normal, you will receive your results only by: MyChart Message (if you have MyChart) OR A paper copy in the mail If you have any lab test that is abnormal or we need to change your treatment, we will call you to review the results.  Testing/Procedures: Your physician has requested that you have a cardiac catheterization. Cardiac catheterization is used to diagnose and/or treat various heart conditions. Doctors may recommend this procedure for a number of different reasons. The most common reason is to evaluate chest pain. Chest pain can be a symptom of coronary artery disease (CAD), and cardiac catheterization can show whether plaque is narrowing or blocking your heart's arteries. This procedure is also used to evaluate the valves, as well as measure the blood flow and oxygen levels in different parts of your heart. For further information please visit [email or web address] Please follow instruction sheet, as given.   Follow-Up: Per Structural Heart Team  Other Instructions       Cardiac/Peripheral Catheterization   You are scheduled for a Cardiac Catheterization on Wednesday, June 25 with Dr. Lurena Red.  1. Please arrive at the [facility] (Main Entrance A) at [facility]: [address] [place], [place] [address] at 10:00 AM (This time is TWO hour(s) before your procedure to ensure your preparation).   Free valet parking service is available. You will check in at ADMITTING. The support person will be asked to wait in the waiting room.  It is OK to have someone drop you off and come back when you are ready to be discharged.        Special note: Every effort is made to have your procedure done on time. Please understand that emergencies sometimes  delay scheduled procedures.  2. Diet: Do not eat solid foods after midnight.  You may have clear liquids until 5 AM the day of the procedure.  3. Labs: blood work is complete as of 06/19/23.  No repeat needed per Dr. Red  4. Medication instructions in preparation for your procedure:   Contrast Allergy: No  No Lasix  (furosemide ) day of procedure  Take only 5 units of insulin  the night before your procedure. Do not take any insulin  on the day of the procedure.   On the morning of your procedure, take Aspirin  81 mg and any morning medicines NOT listed above.  You may use sips of water .  5. Plan to go home the same day, you will only stay overnight if medically necessary. 6. You MUST have a responsible adult to drive you home. 7. An adult MUST be with you the first 24 hours after you arrive home. 8. Bring a current list of your medications, and the last time and date medication taken. 9. Bring ID and current insurance cards. 10.Please wear clothes that are easy to get on and off and wear slip-on shoes.  Thank you for allowing us  to care for you!   -- [facility]

## 2023-07-13 ENCOUNTER — Other Ambulatory Visit: Payer: Self-pay | Admitting: Internal Medicine

## 2023-07-13 DIAGNOSIS — Z1231 Encounter for screening mammogram for malignant neoplasm of breast: Secondary | ICD-10-CM

## 2023-07-14 ENCOUNTER — Ambulatory Visit (HOSPITAL_COMMUNITY)
Admission: RE | Admit: 2023-07-14 | Discharge: 2023-07-14 | Disposition: A | Discharge status: Home / Self Care | Attending: Internal Medicine | Admitting: Internal Medicine

## 2023-07-14 ENCOUNTER — Other Ambulatory Visit: Payer: Self-pay | Admitting: Physician Assistant

## 2023-07-14 ENCOUNTER — Other Ambulatory Visit: Payer: Self-pay

## 2023-07-14 ENCOUNTER — Encounter: Payer: Self-pay | Admitting: Physician Assistant

## 2023-07-14 ENCOUNTER — Encounter (HOSPITAL_COMMUNITY)
Admission: RE | Disposition: A | Discharge status: Home / Self Care | Payer: Self-pay | Source: Home / Self Care | Attending: Internal Medicine

## 2023-07-14 DIAGNOSIS — Z79899 Other long term (current) drug therapy: Secondary | ICD-10-CM | POA: Insufficient documentation

## 2023-07-14 DIAGNOSIS — Z794 Long term (current) use of insulin: Secondary | ICD-10-CM | POA: Diagnosis not present

## 2023-07-14 DIAGNOSIS — I429 Cardiomyopathy, unspecified: Secondary | ICD-10-CM | POA: Insufficient documentation

## 2023-07-14 DIAGNOSIS — I350 Nonrheumatic aortic (valve) stenosis: Secondary | ICD-10-CM

## 2023-07-14 DIAGNOSIS — I453 Trifascicular block: Secondary | ICD-10-CM | POA: Insufficient documentation

## 2023-07-14 DIAGNOSIS — N186 End stage renal disease: Secondary | ICD-10-CM | POA: Diagnosis not present

## 2023-07-14 DIAGNOSIS — R9439 Abnormal result of other cardiovascular function study: Secondary | ICD-10-CM | POA: Diagnosis not present

## 2023-07-14 DIAGNOSIS — I2510 Atherosclerotic heart disease of native coronary artery without angina pectoris: Secondary | ICD-10-CM

## 2023-07-14 DIAGNOSIS — E1122 Type 2 diabetes mellitus with diabetic chronic kidney disease: Secondary | ICD-10-CM | POA: Insufficient documentation

## 2023-07-14 DIAGNOSIS — Z992 Dependence on renal dialysis: Secondary | ICD-10-CM | POA: Insufficient documentation

## 2023-07-14 DIAGNOSIS — E785 Hyperlipidemia, unspecified: Secondary | ICD-10-CM | POA: Insufficient documentation

## 2023-07-14 DIAGNOSIS — I5022 Chronic systolic (congestive) heart failure: Secondary | ICD-10-CM | POA: Diagnosis not present

## 2023-07-14 DIAGNOSIS — I700 Atherosclerosis of aorta: Secondary | ICD-10-CM | POA: Diagnosis not present

## 2023-07-14 DIAGNOSIS — Z7902 Long term (current) use of antithrombotics/antiplatelets: Secondary | ICD-10-CM | POA: Insufficient documentation

## 2023-07-14 HISTORY — PX: ABDOMINAL AORTOGRAM: CATH118222

## 2023-07-14 HISTORY — PX: LEFT HEART CATH AND CORONARY ANGIOGRAPHY: CATH118249

## 2023-07-14 LAB — GLUCOSE, CAPILLARY
Glucose-Capillary: 231 mg/dL — ABNORMAL HIGH (ref 70–99)
Glucose-Capillary: 186 mg/dL — ABNORMAL HIGH (ref 70–99)

## 2023-07-14 MED ORDER — LIDOCAINE HCL (PF) 1 % IJ SOLN
INTRAMUSCULAR | Status: DC | PRN
  Administered 2023-07-14: 10 mL

## 2023-07-14 MED ORDER — SODIUM CHLORIDE 0.9% FLUSH: 3.0000 mL | INTRAVENOUS | Status: DC | PRN

## 2023-07-14 MED ORDER — LABETALOL HCL 5 MG/ML IV SOLN: 10.0000 mg | INTRAVENOUS | Status: DC | PRN

## 2023-07-14 MED ORDER — SODIUM CHLORIDE 0.9 % IV SOLN: INTRAVENOUS | Status: DC

## 2023-07-14 MED ORDER — IOHEXOL 350 MG/ML SOLN
INTRAVENOUS | Status: DC | PRN
  Administered 2023-07-14: 85 mL via INTRA_ARTERIAL

## 2023-07-14 MED ORDER — LIDOCAINE HCL (PF) 1 % IJ SOLN
INTRAMUSCULAR | Status: AC
  Filled 2023-07-14: qty 30

## 2023-07-14 MED ORDER — FENTANYL CITRATE (PF) 100 MCG/2ML IJ SOLN
INTRAMUSCULAR | Status: AC
Start: 2023-07-14 — End: 2023-07-14
  Filled 2023-07-14: qty 2

## 2023-07-14 MED ORDER — ATROPINE SULFATE 1 MG/10ML IJ SOSY
PREFILLED_SYRINGE | INTRAMUSCULAR | Status: AC
  Filled 2023-07-14: qty 10

## 2023-07-14 MED ORDER — SODIUM CHLORIDE 0.9 % IV SOLN: 250.0000 mL | INTRAVENOUS | Status: DC | PRN

## 2023-07-14 MED ORDER — SODIUM CHLORIDE 0.9 % IV SOLN
INTRAVENOUS | Status: DC | PRN
  Administered 2023-07-14: 10 mL/h via INTRAVENOUS

## 2023-07-14 MED ORDER — HEPARIN (PORCINE) IN NACL 1000-0.9 UT/500ML-% IV SOLN
INTRAVENOUS | Status: DC | PRN
Start: 2023-07-14 — End: 2023-07-15
  Administered 2023-07-14: 1000 mL via SURGICAL_CAVITY

## 2023-07-14 MED ORDER — SODIUM CHLORIDE 0.9% FLUSH: 3.0000 mL | Freq: Two times a day (BID) | INTRAVENOUS | Status: DC

## 2023-07-14 MED ORDER — HYDRALAZINE HCL 20 MG/ML IJ SOLN: 10.0000 mg | INTRAMUSCULAR | Status: DC | PRN

## 2023-07-14 MED ORDER — FENTANYL CITRATE (PF) 100 MCG/2ML IJ SOLN
INTRAMUSCULAR | Status: DC | PRN
  Administered 2023-07-14: 25 ug via INTRAVENOUS

## 2023-07-14 MED ORDER — ASPIRIN 81 MG PO CHEW: 81.0000 mg | CHEWABLE_TABLET | Freq: Once | ORAL | Status: DC

## 2023-07-14 MED ORDER — ACETAMINOPHEN 325 MG PO TABS: 650.0000 mg | ORAL_TABLET | ORAL | Status: DC | PRN

## 2023-07-14 SURGICAL SUPPLY — 10 items
CATH INFINITI 6F ANG MULTIPACK (CATHETERS) IMPLANT
PACK CARDIAC CATHETERIZATION (CUSTOM PROCEDURE TRAY) ×1 IMPLANT
SET ATX-X65L (MISCELLANEOUS) IMPLANT
SHEATH PINNACLE 5F 10CM (SHEATH) IMPLANT
SHEATH PINNACLE 6F 10CM (SHEATH) IMPLANT
SHEATH PROBE COVER 6X72 (BAG) IMPLANT
TUBING CIL FLEX 10 FLL-RA (TUBING) IMPLANT
WIRE EMERALD 3MM-J .035X150CM (WIRE) IMPLANT
WIRE MICRO SET SILHO 5FR 7 (SHEATH) IMPLANT
WIRE MICROINTRODUCER 60CM (WIRE) IMPLANT

## 2023-07-14 NOTE — Interval H&P Note (Signed)
 History and Physical Interval Note:  07/14/2023 10:18 AM  Rhonda Clayton  has presented today for surgery, with the diagnosis of aortic stenosis.  The various methods of treatment have been discussed with the patient and family. After consideration of risks, benefits and other options for treatment, the patient has consented to  Procedure(s): LEFT HEART CATH AND CORONARY ANGIOGRAPHY (N/A) as a surgical intervention.  The patient's history has been reviewed, patient examined, no change in status, stable for surgery.  I have reviewed the patient's chart and labs.  Questions were answered to the patient's satisfaction.     Jamesmichael Shadd K Kinnie Kaupp

## 2023-07-14 NOTE — Progress Notes (Addendum)
 Site area: Right groin  Site Prior to Removal:  Level 0 Pressure Applied For: 30 minutes Manual:   Yes Patient Status During Pull:  stable Post Pull Site:  Level 0 Post Pull Instructions Given: Yes Post Pull Pulses Present: Right DP doppler Dressing Applied:  gauze and tegaderm Bedrest begins @ 1525 Comments: ecchymosis around puncture site due to hematoma that formed during sheath pull.

## 2023-07-15 ENCOUNTER — Ambulatory Visit: Admitting: Internal Medicine

## 2023-07-15 ENCOUNTER — Encounter (HOSPITAL_COMMUNITY): Payer: Self-pay | Admitting: Internal Medicine

## 2023-07-19 DIAGNOSIS — D631 Anemia in chronic kidney disease: Secondary | ICD-10-CM | POA: Diagnosis not present

## 2023-07-19 DIAGNOSIS — I120 Hypertensive chronic kidney disease with stage 5 chronic kidney disease or end stage renal disease: Secondary | ICD-10-CM | POA: Diagnosis not present

## 2023-07-19 DIAGNOSIS — N185 Chronic kidney disease, stage 5: Secondary | ICD-10-CM | POA: Diagnosis not present

## 2023-07-19 DIAGNOSIS — N186 End stage renal disease: Secondary | ICD-10-CM | POA: Diagnosis not present

## 2023-07-19 DIAGNOSIS — Z992 Dependence on renal dialysis: Secondary | ICD-10-CM | POA: Diagnosis not present

## 2023-07-19 DIAGNOSIS — E8770 Fluid overload, unspecified: Secondary | ICD-10-CM | POA: Diagnosis not present

## 2023-07-19 DIAGNOSIS — I5033 Acute on chronic diastolic (congestive) heart failure: Secondary | ICD-10-CM | POA: Diagnosis not present

## 2023-07-20 DIAGNOSIS — Z992 Dependence on renal dialysis: Secondary | ICD-10-CM | POA: Diagnosis not present

## 2023-07-20 DIAGNOSIS — N186 End stage renal disease: Secondary | ICD-10-CM | POA: Diagnosis not present

## 2023-07-20 DIAGNOSIS — E8770 Fluid overload, unspecified: Secondary | ICD-10-CM | POA: Diagnosis not present

## 2023-07-21 ENCOUNTER — Encounter (HOSPITAL_COMMUNITY): Payer: Self-pay | Admitting: Surgery

## 2023-07-22 ENCOUNTER — Encounter (HOSPITAL_COMMUNITY): Payer: Self-pay | Admitting: Emergency Medicine

## 2023-07-22 ENCOUNTER — Other Ambulatory Visit: Payer: Self-pay

## 2023-07-22 ENCOUNTER — Ambulatory Visit (HOSPITAL_COMMUNITY)
Admission: RE | Admit: 2023-07-22 | Discharge: 2023-07-22 | Disposition: A | Discharge status: Home / Self Care | Source: Home / Self Care | Attending: Surgery | Admitting: Surgery

## 2023-07-22 ENCOUNTER — Inpatient Hospital Stay (HOSPITAL_COMMUNITY)
Admission: EM | Admit: 2023-07-22 | Discharge: 2023-07-31 | DRG: 266 | Disposition: A | Discharge status: Home Health | Attending: Cardiology | Admitting: Cardiology

## 2023-07-22 ENCOUNTER — Emergency Department (HOSPITAL_COMMUNITY)

## 2023-07-22 ENCOUNTER — Encounter (HOSPITAL_COMMUNITY)
Admission: RE | Disposition: A | Discharge status: Home / Self Care | Payer: Self-pay | Source: Home / Self Care | Attending: Surgery

## 2023-07-22 DIAGNOSIS — Z887 Allergy status to serum and vaccine status: Secondary | ICD-10-CM

## 2023-07-22 DIAGNOSIS — E039 Hypothyroidism, unspecified: Secondary | ICD-10-CM | POA: Diagnosis present

## 2023-07-22 DIAGNOSIS — Z950 Presence of cardiac pacemaker: Secondary | ICD-10-CM | POA: Diagnosis not present

## 2023-07-22 DIAGNOSIS — I429 Cardiomyopathy, unspecified: Secondary | ICD-10-CM | POA: Diagnosis not present

## 2023-07-22 DIAGNOSIS — Z992 Dependence on renal dialysis: Secondary | ICD-10-CM

## 2023-07-22 DIAGNOSIS — Z8249 Family history of ischemic heart disease and other diseases of the circulatory system: Secondary | ICD-10-CM | POA: Diagnosis not present

## 2023-07-22 DIAGNOSIS — D631 Anemia in chronic kidney disease: Secondary | ICD-10-CM | POA: Diagnosis not present

## 2023-07-22 DIAGNOSIS — I3481 Nonrheumatic mitral (valve) annulus calcification: Secondary | ICD-10-CM | POA: Diagnosis not present

## 2023-07-22 DIAGNOSIS — Z79899 Other long term (current) drug therapy: Secondary | ICD-10-CM

## 2023-07-22 DIAGNOSIS — E669 Obesity, unspecified: Secondary | ICD-10-CM | POA: Diagnosis not present

## 2023-07-22 DIAGNOSIS — I4430 Unspecified atrioventricular block: Secondary | ICD-10-CM | POA: Diagnosis not present

## 2023-07-22 DIAGNOSIS — Z743 Need for continuous supervision: Secondary | ICD-10-CM | POA: Diagnosis not present

## 2023-07-22 DIAGNOSIS — I2510 Atherosclerotic heart disease of native coronary artery without angina pectoris: Secondary | ICD-10-CM | POA: Diagnosis not present

## 2023-07-22 DIAGNOSIS — I350 Nonrheumatic aortic (valve) stenosis: Secondary | ICD-10-CM | POA: Diagnosis present

## 2023-07-22 DIAGNOSIS — E119 Type 2 diabetes mellitus without complications: Secondary | ICD-10-CM

## 2023-07-22 DIAGNOSIS — I959 Hypotension, unspecified: Secondary | ICD-10-CM | POA: Diagnosis not present

## 2023-07-22 DIAGNOSIS — I495 Sick sinus syndrome: Secondary | ICD-10-CM | POA: Diagnosis not present

## 2023-07-22 DIAGNOSIS — N186 End stage renal disease: Secondary | ICD-10-CM | POA: Diagnosis not present

## 2023-07-22 DIAGNOSIS — Z888 Allergy status to other drugs, medicaments and biological substances status: Secondary | ICD-10-CM

## 2023-07-22 DIAGNOSIS — R0989 Other specified symptoms and signs involving the circulatory and respiratory systems: Secondary | ICD-10-CM | POA: Diagnosis not present

## 2023-07-22 DIAGNOSIS — Z808 Family history of malignant neoplasm of other organs or systems: Secondary | ICD-10-CM

## 2023-07-22 DIAGNOSIS — Z981 Arthrodesis status: Secondary | ICD-10-CM

## 2023-07-22 DIAGNOSIS — I453 Trifascicular block: Secondary | ICD-10-CM | POA: Diagnosis not present

## 2023-07-22 DIAGNOSIS — E1122 Type 2 diabetes mellitus with diabetic chronic kidney disease: Secondary | ICD-10-CM | POA: Diagnosis present

## 2023-07-22 DIAGNOSIS — I110 Hypertensive heart disease with heart failure: Secondary | ICD-10-CM | POA: Diagnosis not present

## 2023-07-22 DIAGNOSIS — Z952 Presence of prosthetic heart valve: Secondary | ICD-10-CM | POA: Diagnosis not present

## 2023-07-22 DIAGNOSIS — L8990 Pressure ulcer of unspecified site, unspecified stage: Secondary | ICD-10-CM | POA: Insufficient documentation

## 2023-07-22 DIAGNOSIS — I455 Other specified heart block: Secondary | ICD-10-CM | POA: Diagnosis not present

## 2023-07-22 DIAGNOSIS — N2581 Secondary hyperparathyroidism of renal origin: Secondary | ICD-10-CM | POA: Diagnosis not present

## 2023-07-22 DIAGNOSIS — Z66 Do not resuscitate: Secondary | ICD-10-CM | POA: Diagnosis not present

## 2023-07-22 DIAGNOSIS — Z7982 Long term (current) use of aspirin: Secondary | ICD-10-CM

## 2023-07-22 DIAGNOSIS — I771 Stricture of artery: Secondary | ICD-10-CM | POA: Diagnosis not present

## 2023-07-22 DIAGNOSIS — I132 Hypertensive heart and chronic kidney disease with heart failure and with stage 5 chronic kidney disease, or end stage renal disease: Secondary | ICD-10-CM | POA: Diagnosis present

## 2023-07-22 DIAGNOSIS — Z881 Allergy status to other antibiotic agents status: Secondary | ICD-10-CM

## 2023-07-22 DIAGNOSIS — E785 Hyperlipidemia, unspecified: Secondary | ICD-10-CM | POA: Diagnosis not present

## 2023-07-22 DIAGNOSIS — I5022 Chronic systolic (congestive) heart failure: Secondary | ICD-10-CM | POA: Diagnosis not present

## 2023-07-22 DIAGNOSIS — K5730 Diverticulosis of large intestine without perforation or abscess without bleeding: Secondary | ICD-10-CM | POA: Diagnosis not present

## 2023-07-22 DIAGNOSIS — Y841 Kidney dialysis as the cause of abnormal reaction of the patient, or of later complication, without mention of misadventure at the time of the procedure: Secondary | ICD-10-CM | POA: Diagnosis present

## 2023-07-22 DIAGNOSIS — Z79890 Hormone replacement therapy (postmenopausal): Secondary | ICD-10-CM

## 2023-07-22 DIAGNOSIS — Z01818 Encounter for other preprocedural examination: Secondary | ICD-10-CM | POA: Diagnosis not present

## 2023-07-22 DIAGNOSIS — R55 Syncope and collapse: Secondary | ICD-10-CM | POA: Diagnosis not present

## 2023-07-22 DIAGNOSIS — I469 Cardiac arrest, cause unspecified: Secondary | ICD-10-CM | POA: Diagnosis not present

## 2023-07-22 DIAGNOSIS — Z91048 Other nonmedicinal substance allergy status: Secondary | ICD-10-CM

## 2023-07-22 DIAGNOSIS — Z9102 Food additives allergy status: Secondary | ICD-10-CM

## 2023-07-22 DIAGNOSIS — Z794 Long term (current) use of insulin: Secondary | ICD-10-CM

## 2023-07-22 DIAGNOSIS — Z452 Encounter for adjustment and management of vascular access device: Secondary | ICD-10-CM

## 2023-07-22 DIAGNOSIS — Z006 Encounter for examination for normal comparison and control in clinical research program: Secondary | ICD-10-CM

## 2023-07-22 DIAGNOSIS — I120 Hypertensive chronic kidney disease with stage 5 chronic kidney disease or end stage renal disease: Secondary | ICD-10-CM | POA: Diagnosis not present

## 2023-07-22 DIAGNOSIS — R9431 Abnormal electrocardiogram [ECG] [EKG]: Secondary | ICD-10-CM | POA: Diagnosis present

## 2023-07-22 DIAGNOSIS — N184 Chronic kidney disease, stage 4 (severe): Secondary | ICD-10-CM | POA: Diagnosis not present

## 2023-07-22 DIAGNOSIS — T8249XA Other complication of vascular dialysis catheter, initial encounter: Secondary | ICD-10-CM | POA: Diagnosis present

## 2023-07-22 DIAGNOSIS — N250 Renal osteodystrophy: Secondary | ICD-10-CM | POA: Diagnosis not present

## 2023-07-22 DIAGNOSIS — E1159 Type 2 diabetes mellitus with other circulatory complications: Secondary | ICD-10-CM | POA: Diagnosis not present

## 2023-07-22 DIAGNOSIS — Z83719 Family history of colon polyps, unspecified: Secondary | ICD-10-CM

## 2023-07-22 DIAGNOSIS — I130 Hypertensive heart and chronic kidney disease with heart failure and stage 1 through stage 4 chronic kidney disease, or unspecified chronic kidney disease: Secondary | ICD-10-CM | POA: Diagnosis not present

## 2023-07-22 DIAGNOSIS — I700 Atherosclerosis of aorta: Secondary | ICD-10-CM | POA: Diagnosis present

## 2023-07-22 DIAGNOSIS — Z4682 Encounter for fitting and adjustment of non-vascular catheter: Secondary | ICD-10-CM | POA: Diagnosis not present

## 2023-07-22 DIAGNOSIS — Z807 Family history of other malignant neoplasms of lymphoid, hematopoietic and related tissues: Secondary | ICD-10-CM

## 2023-07-22 DIAGNOSIS — I080 Rheumatic disorders of both mitral and aortic valves: Secondary | ICD-10-CM | POA: Diagnosis not present

## 2023-07-22 DIAGNOSIS — N281 Cyst of kidney, acquired: Secondary | ICD-10-CM | POA: Diagnosis not present

## 2023-07-22 DIAGNOSIS — I701 Atherosclerosis of renal artery: Secondary | ICD-10-CM | POA: Diagnosis not present

## 2023-07-22 DIAGNOSIS — I440 Atrioventricular block, first degree: Secondary | ICD-10-CM | POA: Diagnosis present

## 2023-07-22 DIAGNOSIS — Z833 Family history of diabetes mellitus: Secondary | ICD-10-CM

## 2023-07-22 DIAGNOSIS — Z6832 Body mass index (BMI) 32.0-32.9, adult: Secondary | ICD-10-CM

## 2023-07-22 DIAGNOSIS — I5042 Chronic combined systolic (congestive) and diastolic (congestive) heart failure: Secondary | ICD-10-CM | POA: Diagnosis not present

## 2023-07-22 DIAGNOSIS — Z882 Allergy status to sulfonamides status: Secondary | ICD-10-CM

## 2023-07-22 DIAGNOSIS — I5020 Unspecified systolic (congestive) heart failure: Secondary | ICD-10-CM | POA: Diagnosis not present

## 2023-07-22 DIAGNOSIS — R001 Bradycardia, unspecified: Secondary | ICD-10-CM | POA: Diagnosis not present

## 2023-07-22 DIAGNOSIS — Z7401 Bed confinement status: Secondary | ICD-10-CM

## 2023-07-22 DIAGNOSIS — I10 Essential (primary) hypertension: Secondary | ICD-10-CM | POA: Diagnosis present

## 2023-07-22 DIAGNOSIS — I5023 Acute on chronic systolic (congestive) heart failure: Secondary | ICD-10-CM | POA: Diagnosis not present

## 2023-07-22 HISTORY — PX: TUNNELLED CATHETER EXCHANGE: CATH118373

## 2023-07-22 LAB — CBC WITH DIFFERENTIAL/PLATELET
Abs Immature Granulocytes: 0.02 10*3/uL (ref 0.00–0.07)
Basophils Absolute: 0.1 10*3/uL (ref 0.0–0.1)
Basophils Relative: 1 %
Eosinophils Absolute: 0.5 10*3/uL (ref 0.0–0.5)
Eosinophils Relative: 9 %
HCT: 34.7 % — ABNORMAL LOW (ref 36.0–46.0)
Hemoglobin: 10.4 g/dL — ABNORMAL LOW (ref 12.0–15.0)
Immature Granulocytes: 0 %
Lymphocytes Relative: 27 %
Lymphs Abs: 1.5 10*3/uL (ref 0.7–4.0)
MCH: 27.9 pg (ref 26.0–34.0)
MCHC: 30 g/dL (ref 30.0–36.0)
MCV: 93 fL (ref 80.0–100.0)
Monocytes Absolute: 0.6 10*3/uL (ref 0.1–1.0)
Monocytes Relative: 11 %
Neutro Abs: 2.9 10*3/uL (ref 1.7–7.7)
Neutrophils Relative %: 52 %
Platelets: 255 10*3/uL (ref 150–400)
RBC: 3.73 MIL/uL — ABNORMAL LOW (ref 3.87–5.11)
RDW: 17 % — ABNORMAL HIGH (ref 11.5–15.5)
WBC: 5.7 10*3/uL (ref 4.0–10.5)
nRBC: 0 % (ref 0.0–0.2)

## 2023-07-22 LAB — BASIC METABOLIC PANEL WITH GFR
Anion gap: 15 (ref 5–15)
BUN: 29 mg/dL — ABNORMAL HIGH (ref 8–23)
CO2: 22 mmol/L (ref 22–32)
Calcium: 8.8 mg/dL — ABNORMAL LOW (ref 8.9–10.3)
Chloride: 102 mmol/L (ref 98–111)
Creatinine, Ser: 3.75 mg/dL — ABNORMAL HIGH (ref 0.44–1.00)
GFR, Estimated: 12 mL/min — ABNORMAL LOW (ref >60)
Glucose, Bld: 168 mg/dL — ABNORMAL HIGH (ref 70–99)
Potassium: 3.1 mmol/L — ABNORMAL LOW (ref 3.5–5.1)
Sodium: 139 mmol/L (ref 135–145)

## 2023-07-22 LAB — URINALYSIS, ROUTINE W REFLEX MICROSCOPIC
Bacteria, UA: NONE SEEN
Bilirubin Urine: NEGATIVE
Glucose, UA: 150 mg/dL — AB
Hgb urine dipstick: NEGATIVE
Ketones, ur: NEGATIVE mg/dL
Leukocytes,Ua: NEGATIVE
Nitrite: NEGATIVE
Protein, ur: 100 mg/dL — AB
Specific Gravity, Urine: 1.008 (ref 1.005–1.030)
pH: 8 (ref 5.0–8.0)

## 2023-07-22 LAB — GLUCOSE, CAPILLARY
Glucose-Capillary: 175 mg/dL — ABNORMAL HIGH (ref 70–99)
Glucose-Capillary: 150 mg/dL — ABNORMAL HIGH (ref 70–99)
Glucose-Capillary: 184 mg/dL — ABNORMAL HIGH (ref 70–99)

## 2023-07-22 LAB — TROPONIN I (HIGH SENSITIVITY)
Troponin I (High Sensitivity): 41 ng/L — ABNORMAL HIGH (ref <18)
Troponin I (High Sensitivity): 40 ng/L — ABNORMAL HIGH (ref <18)

## 2023-07-22 LAB — HEPATITIS B SURFACE ANTIGEN: Hepatitis B Surface Ag: NONREACTIVE

## 2023-07-22 LAB — BRAIN NATRIURETIC PEPTIDE: B Natriuretic Peptide: 1346.7 pg/mL — ABNORMAL HIGH (ref 0.0–100.0)

## 2023-07-22 SURGERY — TUNNELLED CATHETER EXCHANGE
Anesthesia: LOCAL

## 2023-07-22 MED ORDER — ACETAMINOPHEN 325 MG PO TABS
650.0000 mg | ORAL_TABLET | Freq: Four times a day (QID) | ORAL | Status: DC | PRN
  Administered 2023-07-27 – 2023-07-31 (×3): 650 mg via ORAL
  Filled 2023-07-22 (×3): qty 2

## 2023-07-22 MED ORDER — ACETAMINOPHEN 650 MG RE SUPP: 650.0000 mg | Freq: Four times a day (QID) | RECTAL | Status: DC | PRN

## 2023-07-22 MED ORDER — HEPARIN SODIUM (PORCINE) 1000 UNIT/ML DIALYSIS: 2000.0000 [IU] | Freq: Once | INTRAMUSCULAR | Status: DC

## 2023-07-22 MED ORDER — HEPARIN SODIUM (PORCINE) 1000 UNIT/ML DIALYSIS
2000.0000 [IU] | INTRAMUSCULAR | Status: AC | PRN
  Administered 2023-07-26: 2000 [IU] via INTRAVENOUS_CENTRAL

## 2023-07-22 MED ORDER — HEPARIN SODIUM (PORCINE) 1000 UNIT/ML DIALYSIS
1000.0000 [IU] | INTRAMUSCULAR | Status: DC | PRN
  Administered 2023-07-26: 1000 [IU]
  Administered 2023-07-28: 3200 [IU]

## 2023-07-22 MED ORDER — LIDOCAINE-PRILOCAINE 2.5-2.5 % EX CREA: 1.0000 | TOPICAL_CREAM | CUTANEOUS | Status: DC | PRN

## 2023-07-22 MED ORDER — HEPARIN SODIUM (PORCINE) 1000 UNIT/ML DIALYSIS
20.0000 [IU]/kg | INTRAMUSCULAR | Status: DC | PRN
  Filled 2023-07-22: qty 2

## 2023-07-22 MED ORDER — ANTICOAGULANT SODIUM CITRATE 4% (200MG/5ML) IV SOLN
5.0000 mL | Status: DC | PRN
Start: 2023-07-22 — End: 2023-07-31

## 2023-07-22 MED ORDER — INSULIN ASPART 100 UNIT/ML IJ SOLN: 0.0000 [IU] | Freq: Three times a day (TID) | INTRAMUSCULAR | Status: DC

## 2023-07-22 MED ORDER — LEVOTHYROXINE SODIUM 100 MCG PO TABS: 100.0000 ug | ORAL_TABLET | ORAL | Status: DC

## 2023-07-22 MED ORDER — LIDOCAINE HCL (PF) 1 % IJ SOLN: 5.0000 mL | INTRAMUSCULAR | Status: DC | PRN

## 2023-07-22 MED ORDER — NEPRO/CARBSTEADY PO LIQD: 237.0000 mL | ORAL | Status: DC | PRN

## 2023-07-22 MED ORDER — HEPARIN SODIUM (PORCINE) 1000 UNIT/ML IJ SOLN
INTRAMUSCULAR | Status: DC | PRN
Start: 2023-07-22 — End: 2023-07-22
  Administered 2023-07-22: 3200 [IU] via INTRAVENOUS

## 2023-07-22 MED ORDER — HEPARIN SODIUM (PORCINE) 1000 UNIT/ML IJ SOLN
INTRAMUSCULAR | Status: AC
  Filled 2023-07-22: qty 10

## 2023-07-22 MED ORDER — HEPARIN SODIUM (PORCINE) 5000 UNIT/ML IJ SOLN
5000.0000 [IU] | Freq: Three times a day (TID) | INTRAMUSCULAR | Status: DC
  Administered 2023-07-22 – 2023-07-31 (×24): 5000 [IU] via SUBCUTANEOUS
  Filled 2023-07-22 (×25): qty 1

## 2023-07-22 MED ORDER — LIDOCAINE HCL (PF) 1 % IJ SOLN
INTRAMUSCULAR | Status: DC | PRN
  Administered 2023-07-22: 15 mL via INTRADERMAL

## 2023-07-22 MED ORDER — PENTAFLUOROPROP-TETRAFLUOROETH EX AERO: 1.0000 | INHALATION_SPRAY | CUTANEOUS | Status: DC | PRN

## 2023-07-22 MED ORDER — HEPARIN SODIUM (PORCINE) 1000 UNIT/ML DIALYSIS
1000.0000 [IU] | INTRAMUSCULAR | Status: DC | PRN
Start: 2023-07-22 — End: 2023-07-22

## 2023-07-22 MED ORDER — CHLORHEXIDINE GLUCONATE CLOTH 2 % EX PADS
6.0000 | MEDICATED_PAD | Freq: Every day | CUTANEOUS | Status: DC
  Administered 2023-07-23 – 2023-07-24 (×2): 6 via TOPICAL

## 2023-07-22 MED ORDER — EZETIMIBE 10 MG PO TABS
10.0000 mg | ORAL_TABLET | Freq: Every day | ORAL | Status: DC
  Administered 2023-07-22 – 2023-07-30 (×9): 10 mg via ORAL
  Filled 2023-07-22 (×9): qty 1

## 2023-07-22 MED ORDER — ANTICOAGULANT SODIUM CITRATE 4% (200MG/5ML) IV SOLN: 5.0000 mL | Status: DC | PRN

## 2023-07-22 MED ORDER — LEVOTHYROXINE SODIUM 50 MCG PO TABS
50.0000 ug | ORAL_TABLET | ORAL | Status: DC
  Administered 2023-07-23 – 2023-07-30 (×3): 50 ug via ORAL
  Filled 2023-07-22 (×3): qty 1

## 2023-07-22 MED ORDER — ALTEPLASE 2 MG IJ SOLR: 2.0000 mg | Freq: Once | INTRAMUSCULAR | Status: DC | PRN

## 2023-07-22 MED ORDER — ALTEPLASE 2 MG IJ SOLR
2.0000 mg | Freq: Once | INTRAMUSCULAR | Status: AC | PRN
  Administered 2023-07-26: 2 mg

## 2023-07-22 MED ORDER — LEVOTHYROXINE SODIUM 100 MCG PO TABS
100.0000 ug | ORAL_TABLET | ORAL | Status: DC
  Administered 2023-07-24 – 2023-07-31 (×6): 100 ug via ORAL
  Filled 2023-07-22 (×7): qty 1

## 2023-07-22 MED ORDER — HEPARIN (PORCINE) IN NACL 1000-0.9 UT/500ML-% IV SOLN
INTRAVENOUS | Status: DC | PRN
  Administered 2023-07-22: 500 mL

## 2023-07-22 MED ORDER — LIDOCAINE HCL (PF) 1 % IJ SOLN
INTRAMUSCULAR | Status: AC
  Filled 2023-07-22: qty 30

## 2023-07-22 MED ORDER — INSULIN ASPART 100 UNIT/ML IJ SOLN
0.0000 [IU] | Freq: Three times a day (TID) | INTRAMUSCULAR | Status: DC
  Administered 2023-07-22 – 2023-07-23 (×3): 1 [IU] via SUBCUTANEOUS
  Administered 2023-07-24: 3 [IU] via SUBCUTANEOUS
  Administered 2023-07-24: 5 [IU] via SUBCUTANEOUS
  Administered 2023-07-24: 2 [IU] via SUBCUTANEOUS
  Administered 2023-07-25: 3 [IU] via SUBCUTANEOUS

## 2023-07-22 SURGICAL SUPPLY — 3 items
CATH PALINDROME 19 SP (CATHETERS) IMPLANT
GLIDEWIRE ADV .035X180CM (WIRE) IMPLANT
TRAY PV CATH (CUSTOM PROCEDURE TRAY) ×2 IMPLANT

## 2023-07-22 NOTE — Consult Note (Signed)
 Cardiology Consultation   Patient ID: AMOS GABER MRN: [id number]; DOB: [birth year]  Admit date: 07/22/2023 Date of Consult: 07/22/2023  PCP:  [name], MD   [facility] Providers Cardiologist:  Lurena MARLA Red, MD  Electrophysiologist:  Soyla Gladis Norton, MD    Patient Profile: Rhonda Clayton is a 78 y.o. female with a hx of low flow, low gradient aortic stenosis, DM, HLD, HTN, ESRD on HD, Trifascicular block who is being seen 07/22/2023 for the evaluation of syncope with sinus pauses at the request of Dr. Rojelio.  History of Present Illness: Ms. Hagner is an 78 yo female with PMH noted above. She has been followed by Dr. Red and Dr. Norton outpatient.    In February she began having shortness of breath while completing her IADLs at home. Long term ambulatory monitor obtained for 10 days in March 2025 which showed average HF of 56 bpm with 46 pauses present; the longest pause of 4.7 seconds. She was seen by Dr. Norton who at that time with the pending need for dialysis he recommended a leadless pacemaker. The following week a NM PET stress test was obtained on 06/08/2023 for the exertional chest pain and demonstrated peri-infarct ischemia in the entire LAD distribution. ECHO obtained on 06/14/2023 showed an LVEF of 35-40% with hypokinesis of the anterior wall and the apex  with grade II diastolic dysfunction. AVA 0.89, mean gradient of 26, peak velocity of 3.09 m/s (SVi of 38). LHC was obtained and demonstrated mid- distal triple vessel disease; medical management was recommended.   She states that for the last week she has been having episodes of lightheadedness with flushing that would last a few seconds at a time.  During these episodes she would feel faint however she would recover pretty quickly.  Today during her permacath (only topical lidocaine  was given), the proceduralist had mentioned that she had several pauses on the telemetry during the procedure.  On her  way home she had a presyncopal episode and nearly collapsed while sitting down.  This was preceded by the same flushing type feeling.  Upon arrival to the emergency room she was noted to have 3 to 4.5 second sinus pauses.  Past Medical History:  Diagnosis Date   Anemia    CKD (chronic kidney disease) stage 5, GFR less than 15 ml/min (HCC)    Cystocele    DJD (degenerative joint disease)    DM (diabetes mellitus) (HCC)    Esophageal stricture    Essential hypertension    Hyperlipidemia    Hypothyroidism    Lichen sclerosus    Osteopenia    RBBB with left anterior fascicular block    Rectocele    Stress incontinence    Torn rotator cuff    Bilateral   Type 2 diabetes mellitus (HCC)    Venous insufficiency    Vitreous hemorrhage, left (HCC) 05/25/2019   The nature of the vitreous hemorrhage was discussed with the patient as well as the common causes.   Patients with diabetic eye disease may develop retinal neovascularization.  Other eye conditions develop retinal  neovascularization secondary to retinal venous occlusions.   Vitreous hemorrhage may result from spontaneous vitreous detachment or retinal breaks.  Blunt trauma is a common cause as wl    Past Surgical History:  Procedure Laterality Date   ABDOMINAL AORTOGRAM N/A 07/14/2023   Procedure: ABDOMINAL AORTOGRAM;  Surgeon: Red Lurena MARLA, MD;  Location: [facility];  Service: Cardiovascular;  Laterality: N/A;  ABDOMINAL EXPOSURE N/A 05/09/2014   Procedure: ABDOMINAL EXPOSURE;  Surgeon: Krystal JULIANNA Doing, MD;  Location: [facility] NEURO ORS;  Service: Vascular;  Laterality: N/A;   ANTERIOR LAT LUMBAR FUSION Right 05/09/2014   Procedure: Lumbar three-four, Lumbar four-five Anterior lateral lumbar fusion;  Surgeon: Fairy Levels, MD;  Location: [facility]UR[facility];  Service: Neurosurgery;  Laterality: Right;   ANTERIOR LUMBAR FUSION N/A 05/09/2014   Procedure: Stage 1:Lumbar four-five, Lumbar five-Sacral one Anterior lumbar interbody fusion with  Dr. Doing for approach ;  Surgeon: Fairy Levels, MD;  Location: [facility];  Service: Neurosurgery;  Laterality: N/A;   CHOLECYSTECTOMY  1987   INCONTINENCE SURGERY     INSERTION OF DIALYSIS CATHETER Right 06/17/2023   Procedure: INSERTION OF DIALYSIS CATHETER;  Surgeon: Pearline Norman RAMAN, MD;  Location: [facility] OR;  Service: Vascular;  Laterality: Right;   LEFT HEART CATH AND CORONARY ANGIOGRAPHY N/A 07/14/2023   Procedure: LEFT HEART CATH AND CORONARY ANGIOGRAPHY;  Surgeon: Wendel Lurena POUR, MD;  Location: [facility];  Service: Cardiovascular;  Laterality: N/A;   LUMBAR PERCUTANEOUS PEDICLE SCREW 3 LEVEL N/A 05/10/2014   Procedure: Stage 2: Percutaneous Pedicle Screws; Laminectomy L3-4  ;  Surgeon: Fairy Levels, MD;  Location: [facility];  Service: Neurosurgery;  Laterality: N/A;  Stage 2: Percutaneous Pedicle Screws T10 to Ileum; Laminectomy L3-4     RECTAL SURGERY     VAGINA SURGERY  2018   Almetta Planas Touch- Vaginal Dryness     Home Medications:  Prior to Admission medications   Medication Sig Start Date End Date Taking? Authorizing Provider  aspirin  EC 81 MG tablet Take 1 tablet (81 mg total) by mouth daily. Swallow whole. 06/16/23  Yes Henry Shaver B, NP  betamethasone valerate (VALISONE) 0.1 % cream Apply 1 Application topically as needed (skin condition). 02/23/23  Yes [provider]  Coenzyme Q10 (COQ-10 PO) Take 1 tablet by mouth at bedtime.   Yes [provider]  D-MANNOSE PO Take 1 tablet by mouth at bedtime.   Yes [provider]  ezetimibe  (ZETIA ) 10 MG tablet Take 10 mg by mouth daily.   Yes [provider]  Folic Acid -Vit B6-Vit B12 (FOLBEE) 2.5-25-1 MG TABS tablet Take 1 tablet by mouth at bedtime.   Yes [provider]  furosemide  (LASIX ) 40 MG tablet Take 40 mg by mouth See admin instructions. Pt takes 40 mg on Wed,Sat,Sundays 07/01/23  Yes [provider]  levothyroxine  (SYNTHROID ) 100 MCG tablet Take 100 mcg by  mouth See admin instructions. Takes 100 mcg daily except for Monday and Friday she takes 50 mcg   Yes [provider]  nitroGLYCERIN  (NITROSTAT ) 0.4 MG SL tablet Place 1 tablet (0.4 mg total) under the tongue every 5 (five) minutes as needed for chest pain. 06/01/23 08/30/23 Yes Camnitz, Will Gladis, MD  NOVOLOG  MIX 70/30 FLEXPEN (70-30) 100 UNIT/ML FlexPen Inject into the skin in the morning and at bedtime. Takes 14-15 units in the morning and 9-10 units at before dinner According to how many carbohydrates she eats   Yes [provider]  pantoprazole  (PROTONIX ) 40 MG tablet Take 1.5 tablets by mouth daily as needed (Heartburn). 06/03/23  Yes [provider]  B-D UF III MINI PEN NEEDLES 31G X 5 MM MISC Inject into the skin 2 (two) times daily. 03/01/23   [provider]  Continuous Glucose Sensor (FREESTYLE LIBRE 3 SENSOR) MISC change every 14 days topically to monitor blood glucose continuously 28 days 03/01/23  [provider]  Lancets [facility] DELICA PLUS [place]) MISC Apply topically 3 (three) times daily. 02/22/23   [provider]  [facility] VERIO test strip 3 (three) times daily. 03/03/23   [provider]    Scheduled Meds:  [START ON 07/23/2023] Chlorhexidine  Gluconate Cloth  6 each Topical Q0600   ezetimibe   10 mg Oral Daily   heparin   5,000 Units Subcutaneous Q8H   insulin  aspart  0-6 Units Subcutaneous TID WC   levothyroxine   100 mcg Oral See admin instructions   Continuous Infusions:  PRN Meds: acetaminophen  **OR** acetaminophen   Allergies:    Allergies  Allergen Reactions   Coffee Flavoring Agent (Non-Screening) Other (See Comments)    Sick on stomach, sneezing, backed up   Azithromycin  Other (See Comments)   Fluogen [Influenza Virus Vaccine]    Molds & Smuts     Allergy test showed    Pneumococcal Vac Polyvalent Other (See Comments)    Large site reaction    Tobacco Nausea And Vomiting   Versed  [Midazolam ] Other  (See Comments)    No memory altering medications. Patient request.   Cephalexin  Other (See Comments)    Yeast infection   Ciprofloxacin Other (See Comments)    tendinitis   Oatmeal Other (See Comments)    Sneezing and drainage   Sulfa Antibiotics Other (See Comments)    Yeast infection    Social History:   Social History   Socioeconomic History   Marital status: Widowed    Spouse name: Not on file   Number of children: 2   Years of education: Not on file   Highest education level: Not on file  Occupational History   Occupation: Retired  Tobacco Use   Smoking status: Never   Smokeless tobacco: Never  Vaping Use   Vaping status: Never Used  Substance and Sexual Activity   Alcohol  use: No   Drug use: Yes    Types: Hydromorphone    Sexual activity: Not on file  Other Topics Concern   Not on file  Social History Narrative   Not on file   Social Drivers of Health   Financial Resource Strain: Not on file  Food Insecurity: No Food Insecurity (07/22/2023)   Hunger Vital Sign    Worried About Running Out of Food in the Last Year: Never true    Ran Out of Food in the Last Year: Never true  Transportation Needs: No Transportation Needs (07/22/2023)   PRAPARE - Administrator, Civil Service (Medical): No    Lack of Transportation (Non-Medical): No  Physical Activity: Not on file  Stress: Not on file  Social Connections: Unknown (06/17/2023)   Social Connection and Isolation Panel    Frequency of Communication with Friends and Family: Twice a week    Frequency of Social Gatherings with Friends and Family: Once a week    Attends Religious Services: Patient declined    Database administrator or Organizations: Patient declined    Attends Banker Meetings: Patient declined    Marital Status: Patient declined  Intimate Partner Violence: Not At Risk (06/17/2023)   Humiliation, Afraid, Rape, and Kick questionnaire    Fear of Current or Ex-Partner: No     Emotionally Abused: No    Physically Abused: No    Sexually Abused: No    Family History:   Family History  Problem Relation Age of Onset   Multiple myeloma Mother    Cancer Maternal Aunt  Cancer in the mouth - dipped snuff   Heart disease Brother    Diabetes Father    Colon polyps Brother    Heart disease Maternal Uncle    Heart disease Paternal Uncle    Heart disease Paternal Grandfather      ROS:  Please see the history of present illness.  All other ROS reviewed and negative.     Physical Exam/Data: Vitals:   07/22/23 1600 07/22/23 1700 07/22/23 1827 07/22/23 1829  BP: (!) 157/79 (!) 173/75  (!) 156/58  Pulse: (!) 59 76  (!) 58  Resp: 15 14 18 18   Temp:   97.6 F (36.4 C) 97.6 F (36.4 C)  TempSrc:   Oral Oral  SpO2: 100% 100%  100%   No intake or output data in the 24 hours ending 07/22/23 1954    07/14/2023   10:07 AM 07/12/2023    1:11 PM 07/05/2023   10:18 AM  Last 3 Weights  Weight (lbs) 173 lb 173 lb 179 lb 9.6 oz  Weight (kg) 78.472 kg 78.472 kg 81.466 kg     There is no height or weight on file to calculate BMI.  General:  Well nourished, well developed, in no acute distress HEENT: normal Neck: no JVD Vascular: permacath present on right chest covered ion gauze.  Cardiac:  decreased heart rate; systolic right sided murmur present  Lungs:  clear to auscultation bilaterally, no wheezing, rhonchi or rales  Abd: soft, nontender, no hepatomegaly  Ext: no edema Musculoskeletal:  No deformities, BUE and BLE strength normal and equal Skin: warm and dry  Psych:  Normal affect   EKG:  EKG pending.  Telemetry:  Telemetry was personally reviewed and demonstrates:  Sinus bradycardia with sinus pauses followed by junctional beats.   Relevant CV Studies:  06/08/2023   Findings are consistent with infarction with peri-infarct ischemia in virtually the entire LAD artery distribution. The study is high risk.   LV perfusion is abnormal. There is evidence of  ischemia. There is evidence of infarction. Defect 1: There is a large defect with severe reduction in uptake present in the apical to mid anterior, anterolateral, anteroseptal and apex location(s) that is partially reversible. There is abnormal wall motion in the defect area. Consistent with infarction and peri-infarct ischemia. The defect is consistent with abnormal perfusion in the LAD territory.  ECHO 06/14/2023 1. Left ventricular ejection fraction, by estimation, is 35 to 40%. The  left ventricle has moderately decreased function. The left ventricle  demonstrates regional wall motion abnormalities severe hypokinesis of the  mid-apical anterior wall, the  mid-apical anterolateral wall, the apical septal wall, and the true apex.  There is mild concentric left ventricular hypertrophy. Left ventricular  diastolic parameters are consistent with Grade II diastolic dysfunction  (pseudonormalization).   2. Right ventricular systolic function is normal. The right ventricular  size is mildly enlarged. There is moderately elevated pulmonary artery  systolic pressure. The estimated right ventricular systolic pressure is  51.6 mmHg.   3. Left atrial size was moderately dilated.   4. Right atrial size was mildly dilated.   5. The mitral valve is degenerative. Mild mitral valve regurgitation. No  evidence of mitral stenosis. Moderate mitral annular calcification.   6. The aortic valve is tricuspid. There is severe calcifcation of the  aortic valve. Aortic valve regurgitation is not visualized. Severe low  flow/low gradient aortic valve stenosis. Aortic valve area, by VTI  measures 0.89 cm. Aortic valve mean gradient  measures 26.0 mmHg.   7. The inferior vena cava is normal in size with <50% respiratory  variability, suggesting right atrial pressure of 8 mmHg.   LHC 07/14/2023   1st Diag lesion is 100% stenosed.   Mid LAD lesion is 80% stenosed.   Dist LAD-1 lesion is 99% stenosed.   Dist LAD-2  lesion is 80% stenosed.   Prox Cx to Mid Cx lesion is 80% stenosed.   Mid Cx to Dist Cx lesion is 70% stenosed.  Laboratory Data: High Sensitivity Troponin:   Recent Labs  Lab 07/22/23 1352 07/22/23 1600  TROPONINIHS 41* 40*     Chemistry Recent Labs  Lab 07/22/23 1352  NA 139  K 3.1*  CL 102  CO2 22  GLUCOSE 168*  BUN 29*  CREATININE 3.75*  CALCIUM  8.8*  GFRNONAA 12*  ANIONGAP 15    No results for input(s): PROT, ALBUMIN, AST, ALT, ALKPHOS, BILITOT in the last 168 hours. Lipids No results for input(s): CHOL, TRIG, HDL, LABVLDL, LDLCALC, CHOLHDL in the last 168 hours.  Hematology Recent Labs  Lab 07/22/23 1352  WBC 5.7  RBC 3.73*  HGB 10.4*  HCT 34.7*  MCV 93.0  MCH 27.9  MCHC 30.0  RDW 17.0*  PLT 255   Thyroid  No results for input(s): TSH, FREET4 in the last 168 hours.  BNP Recent Labs  Lab 07/22/23 1456  BNP 1,346.7*    DDimer No results for input(s): DDIMER in the last 168 hours.  Radiology/Studies:  DG Chest 2 View Result Date: 07/22/2023 CLINICAL DATA:  syncope EXAM: CHEST - 2 VIEW COMPARISON:  None available. FINDINGS: Right IJ approach hemodialysis catheter, terminating at the cavoatrial junction. Low lung volumes. No focal airspace consolidation, pleural effusion, or pneumothorax. No cardiomegaly. Tortuous aorta with aortic atherosclerosis. No acute fracture or destructive lesions. Multilevel thoracic osteophytosis. Thoracolumbar fusion hardware. IMPRESSION: 1. Low lung volumes.  No acute cardiopulmonary abnormality. 2. Well-positioned right IJ approach hemodialysis catheter. Electronically Signed   By: Rogelia Myers M.D.   On: 07/22/2023 14:28    Assessment and Plan: Ms. Sigler is a 78 year old female who presents with presyncope in the setting of pauses.Pauses were noted on an ambulatory monitor in March 2025 and she is currently under the care of Dr. Inocencio. She presents today what appears to be symptoms related to  the pauses. She does have aortic stenosis, however her symptoms occur at rest (today's presyncopal episode occurred while she was sitting in the car) which don't correlate with symptoms typically due to AS.   Sinus Pauses Ms. Yearwood presents to the emergency room with presyncope while sitting that is correlated with sinus pauses. These were previously visualized on ambulatory monitor and there were prior plans to place a leadless pacemaker.  Will discuss with the EP in the morning.  Aortic stenosis  - Peak velocity 3.08 m/s, mean gradient 24, Svi 38. DVI 0.28  Multi-vessel CAD  -Diffuse disease seen on coronary angiogram last week; plan for medical management   Renal disease -HD planned for tomorrow    For questions or updates, please contact [facility] Please consult [email or web address] for contact info under   Merlene Blood, MD MS  Cardiology [place]

## 2023-07-22 NOTE — Plan of Care (Signed)
  Problem: Education: Goal: Knowledge of General Education information will improve Description: Including pain rating scale, medication(s)/side effects and non-pharmacologic comfort measures Outcome: Progressing   Problem: Coping: Goal: Level of anxiety will decrease Outcome: Progressing   Problem: Elimination: Goal: Will not experience complications related to bowel motility Outcome: Progressing   

## 2023-07-22 NOTE — ED Triage Notes (Signed)
 Pt arriving POV with concern of syncopal episode today. Pt had her port replaced and when heading home she had an episode and blacked out for a few seconds. Pt typically receives dialysis 4 days a week but missed today due to port replacement. Pt A&O x4. Very weak.

## 2023-07-22 NOTE — Progress Notes (Signed)
 Informed of ESRD patient in [facility]. Presents with symptomatic bradycardia, likely needs pacemaker placement this admission. Is s/p [facility] exchange today therefore has not had HD today. Consulted for provision of routine HD.  Outpatient HD orders: [facility]/TCU. MTTF. 3 hrs. F180. TDC, flow rates: 350/autoflow 1.5. 2k, 2cal. EDW 80kg. Heparin : 2000 units bolus. +heparin  locks. Meds: Mircera 75mg  every 2 weeks (last dose 6/24), venofer  50mg  qweekly.  Plan: -patient to be transferred to [facility] -full consult to follow in AM -no urgent indication for HD today based on today's labs and CXR report -will tentatively plan for HD tomorrow -please call with any questions/concerns in the interim.  Ephriam Stank, MD [facility]

## 2023-07-22 NOTE — ED Provider Notes (Signed)
 [place] EMERGENCY DEPARTMENT AT [facility] Provider Note   CSN: [id number] Arrival date & time: 07/22/23  1301     Patient presents with: Loss of Consciousness   Rhonda Clayton is a 78 y.o. female patient with history of hyperlipidemia, hypertension, diabetes mellitus, end-stage renal disease on dialysis, and aortic stenosis who presents to the emergency department today for further evaluation of loss of consciousness.  Patient states that she has been feeling faint and lightheaded for the past several days.  She does state that she has had history of bradycardia in the past but that had resolved but since returned since she had a heart cath last week.  She had a tunneled cath placed todayas a replacement for dialysis and had some episodes of bradycardia during that procedure per the patient.  She states that she was riding in the car when she got flushed, felt faint and lost consciousness for approximately 3 to 5 seconds.  She denies any chest pain or shortness of breath, fever, chills.    Loss of Consciousness      Prior to Admission medications   Medication Sig Start Date End Date Taking? Authorizing Provider  aspirin  EC 81 MG tablet Take 1 tablet (81 mg total) by mouth daily. Swallow whole. 06/16/23   Henry Manuelita NOVAK, NP  B-D UF III MINI PEN NEEDLES 31G X 5 MM MISC Inject into the skin 2 (two) times daily. 03/01/23   [provider]  betamethasone valerate (VALISONE) 0.1 % cream Apply 1 Application topically as needed (skin condition). 02/23/23   [provider]  Continuous Glucose Sensor (FREESTYLE LIBRE 3 SENSOR) MISC change every 14 days topically to monitor blood glucose continuously 28 days 03/01/23   [provider]  ezetimibe  (ZETIA ) 10 MG tablet Take 10 mg by mouth daily.    [provider]  Folic Acid -Vit B6-Vit B12 (FOLBEE) 2.5-25-1 MG TABS tablet Take 1 tablet by mouth daily.    [provider]  furosemide  (LASIX ) 40  MG tablet Take 40 mg by mouth See admin instructions. Pt takes 40 mg on [facility] 07/01/23   [provider]  Lancets [facility] DELICA PLUS [place]) MISC Apply topically 3 (three) times daily. 02/22/23   [provider]  levothyroxine  (SYNTHROID ) 100 MCG tablet Take 100 mcg by mouth See admin instructions. Takes 100 mcg daily except for Monday and Friday she takes 50 mcg    [provider]  nitroGLYCERIN  (NITROSTAT ) 0.4 MG SL tablet Place 1 tablet (0.4 mg total) under the tongue every 5 (five) minutes as needed for chest pain. 06/01/23 08/30/23  Camnitz, Soyla Lunger, MD  NOVOLOG  MIX 70/30 FLEXPEN (70-30) 100 UNIT/ML FlexPen Inject into the skin in the morning and at bedtime. Takes 14-15 units in the morning and 9-10 units at before dinner According to how many carbohydrates she eats    [provider]  [facility] VERIO test strip 3 (three) times daily. 03/03/23   [provider]  pantoprazole  (PROTONIX ) 40 MG tablet Take 1.5 tablets by mouth daily as needed (Heartburn). 06/03/23   [provider]    Allergies: Coffee flavoring agent (non-screening), Azithromycin , Fluogen [influenza virus vaccine], Molds & smuts, Pneumococcal vac polyvalent, Versed  [midazolam ], Cephalexin , Ciprofloxacin, Oatmeal, and Sulfa antibiotics    Review of Systems  Cardiovascular:  Positive for syncope.  All other systems reviewed and are negative.   Updated Vital Signs BP (!) 173/75   Pulse 76   Temp 97.8 F (36.6 C) (Oral)  Resp 14   SpO2 100%   Physical Exam Vitals and nursing note reviewed.  Constitutional:      General: She is not in acute distress.    Appearance: Normal appearance.  HENT:     Head: Normocephalic and atraumatic.  Eyes:     General:        Right eye: No discharge.        Left eye: No discharge.  Cardiovascular:     Rate and Rhythm: Normal rate and regular rhythm.     Heart sounds: Murmur heard.     Systolic murmur is present with  a grade of 3/6.     Comments: Radial pulses are 2+ bilaterally.  Dorsalis pedis pulses are 2+ bilaterally.  No evidence of pedal edema. Pulmonary:     Comments: Clear to auscultation bilaterally.  Normal effort.  No respiratory distress.  No evidence of wheezes, rales, or rhonchi heard throughout. Abdominal:     General: Abdomen is flat. Bowel sounds are normal. There is no distension.     Tenderness: There is no abdominal tenderness. There is no guarding or rebound.  Musculoskeletal:        General: Normal range of motion.     Cervical back: Neck supple.  Skin:    General: Skin is warm and dry.     Findings: No rash.  Neurological:     General: No focal deficit present.     Mental Status: She is alert.  Psychiatric:        Mood and Affect: Mood normal.        Behavior: Behavior normal.     (all labs ordered are listed, but only abnormal results are displayed) Labs Reviewed  CBC WITH DIFFERENTIAL/PLATELET - Abnormal; Notable for the following components:      Result Value   RBC 3.73 (*)    Hemoglobin 10.4 (*)    HCT 34.7 (*)    RDW 17.0 (*)    All other components within normal limits  BASIC METABOLIC PANEL WITH GFR - Abnormal; Notable for the following components:   Potassium 3.1 (*)    Glucose, Bld 168 (*)    BUN 29 (*)    Creatinine, Ser 3.75 (*)    Calcium  8.8 (*)    GFR, Estimated 12 (*)    All other components within normal limits  BRAIN NATRIURETIC PEPTIDE - Abnormal; Notable for the following components:   B Natriuretic Peptide 1,346.7 (*)    All other components within normal limits  TROPONIN I (HIGH SENSITIVITY) - Abnormal; Notable for the following components:   Troponin I (High Sensitivity) 41 (*)    All other components within normal limits  TROPONIN I (HIGH SENSITIVITY) - Abnormal; Notable for the following components:   Troponin I (High Sensitivity) 40 (*)    All other components within normal limits    EKG: EKG Interpretation Date/Time:  Thursday  July 22 2023 14:50:29 EDT Ventricular Rate:  66 PR Interval:  228 QRS Duration:  159 QT Interval:  503 QTC Calculation: 528 R Axis:   -59  Text Interpretation: Sinus rhythm Supraventricular bigeminy Sinus pause Prolonged PR interval Right bundle branch block LVH with IVCD and secondary repol abnrm Prolonged QT interval Confirmed by Darra Chew [phone]) on 07/22/2023 2:57:11 PM  Radiology: DG Chest 2 View Result Date: 07/22/2023 CLINICAL DATA:  syncope EXAM: CHEST - 2 VIEW COMPARISON:  None available. FINDINGS: Right IJ approach hemodialysis catheter, terminating at the cavoatrial junction. Low lung volumes. No  focal airspace consolidation, pleural effusion, or pneumothorax. No cardiomegaly. Tortuous aorta with aortic atherosclerosis. No acute fracture or destructive lesions. Multilevel thoracic osteophytosis. Thoracolumbar fusion hardware. IMPRESSION: 1. Low lung volumes.  No acute cardiopulmonary abnormality. 2. Well-positioned right IJ approach hemodialysis catheter. Electronically Signed   By: Rogelia Myers M.D.   On: 07/22/2023 14:28     .Critical Care  Performed by: Theotis Cameron HERO, PA-C Authorized by: Theotis Cameron HERO, PA-C   Critical care provider statement:    Critical care time (minutes):  35   Critical care time was exclusive of:  Separately billable procedures and treating other patients   Critical care was necessary to treat or prevent imminent or life-threatening deterioration of the following conditions:  Cardiac failure   Critical care was time spent personally by me on the following activities:  Blood draw for specimens, development of treatment plan with patient or surrogate, discussions with consultants, ordering and performing treatments and interventions, ordering and review of laboratory studies, ordering and review of radiographic studies, pulse oximetry and re-evaluation of patient's condition    Medications Ordered in the ED - No data to display  Clinical Course  as of 07/22/23 1716  Thu Jul 22, 2023  1623 Patient has been having recurrent episodes of this flushed sensation and sensations is going to pass out.  During these episodes she is having runs of sinus pauses on the monitor and these ultimately resolved and patient's symptoms completely go away. [CF]  1645 We placed the patient on pacer pads as she was having the symptoms little more frequently.  Have not actually used them at this time [CF]    Clinical Course User Index [CF] Theotis Cameron HERO, PA-C    Medical Decision Making Rhonda Clayton is a 78 y.o. female patient who presents to the emergency department today for further evaluation of near syncope.  Patient has had several episodes of this near syncope feeling where she feels flushed and faint.  We were able to catch this on telemetry and shows 4 to 5-second pauses.  Will work her up for other cardiac causes.  Low suspicion for neurological cause at this time.  No focal weakness or numbness.  Patient overall stable and does not actually lose consciousness.  The symptoms last again seconds.  Due to the patient's clinical symptoms I do feel the patient likely needs to be admitted for further evaluation.  Troponin is slightly elevated.  BNP is also elevated but seems to be at the patient's baseline.  Creatinine elevated in the setting of end-stage renal disease on dialysis.  Patient likely needs pacemaker.  I discussed the case with Dr. Nanda with cardiology who is in agreement with plan for admission.  We will get her admitted to the hospitalist service and she will be admitted over at [facility].   Amount and/or Complexity of Data Reviewed Labs: ordered. Radiology: ordered.  Risk Decision regarding hospitalization.     Final diagnoses:  Near syncope  Sinus pause    ED Discharge Orders     None          Theotis Cameron [place], [place] 07/22/23 1716    Simon Lavonia SAILOR, MD 07/24/23 [phone]

## 2023-07-22 NOTE — H&P (Signed)
 History and Physical    Rhonda Clayton FMW:[id number] DOB: [birth year] DOA: 07/22/2023  PCP: [name], MD  Patient coming from: Home  Chief Complaint: Near syncopal episode   HPI: Rhonda Clayton is a 78 y.o. female with medical history significant of hyperlipidemia, hypertension, diabetes, end-stage renal disease on dialysis M/T/Th/F, aortic stenosis who presents with near syncopal episode.  She was referred to cardiology for pacemaker placement as an outpatient, which was deferred at that time given relative indication for pacing and aortic valve stenosis.  She followed up with cardiology and were in consideration for valve intervention.  She also underwent heart catheterization at the end of June.  She states that she had been having symptoms of flushing and feeling faint which lasts couple of seconds.  She has a few episodes a day.  Today, she underwent tunnel cath replacement and since then her symptoms got more frequent which is why she came to the emergency department.  ED Course: Patient's episode of flushing and lightheadedness correlates with sinus pauses on telemetry.  These last several seconds and completely resolve.  During these episodes, patient is awake and alert and conversational.  Cardiology consulted and patient being admitted to [facility].  Review of Systems: As per HPI. Otherwise, all other review of systems reviewed and are negative.   Past Medical History:  Diagnosis Date   Anemia    CKD (chronic kidney disease) stage 5, GFR less than 15 ml/min (HCC)    Cystocele    DJD (degenerative joint disease)    DM (diabetes mellitus) (HCC)    Esophageal stricture    Essential hypertension    Hyperlipidemia    Hypothyroidism    Lichen sclerosus    Osteopenia    RBBB with left anterior fascicular block    Rectocele    Stress incontinence    Torn rotator cuff    Bilateral   Type 2 diabetes mellitus (HCC)    Venous insufficiency    Vitreous hemorrhage,  left (HCC) 05/25/2019   The nature of the vitreous hemorrhage was discussed with the patient as well as the common causes.   Patients with diabetic eye disease may develop retinal neovascularization.  Other eye conditions develop retinal  neovascularization secondary to retinal venous occlusions.   Vitreous hemorrhage may result from spontaneous vitreous detachment or retinal breaks.  Blunt trauma is a common cause as wl    Past Surgical History:  Procedure Laterality Date   ABDOMINAL AORTOGRAM N/A 07/14/2023   Procedure: ABDOMINAL AORTOGRAM;  Surgeon: Wendel Lurena POUR, MD;  Location: [facility] INVASIVE CV LAB;  Service: Cardiovascular;  Laterality: N/A;   ABDOMINAL EXPOSURE N/A 05/09/2014   Procedure: ABDOMINAL EXPOSURE;  Surgeon: Krystal JULIANNA Doing, MD;  Location: [facility] NEURO ORS;  Service: Vascular;  Laterality: N/A;   ANTERIOR LAT LUMBAR FUSION Right 05/09/2014   Procedure: Lumbar three-four, Lumbar four-five Anterior lateral lumbar fusion;  Surgeon: Fairy Levels, MD;  Location: [facility] NEURO ORS;  Service: Neurosurgery;  Laterality: Right;   ANTERIOR LUMBAR FUSION N/A 05/09/2014   Procedure: Stage 1:Lumbar four-five, Lumbar five-Sacral one Anterior lumbar interbody fusion with Dr. Doing for approach ;  Surgeon: Fairy Levels, MD;  Location: [facility] NEURO ORS;  Service: Neurosurgery;  Laterality: N/A;   CHOLECYSTECTOMY  1987   INCONTINENCE SURGERY     INSERTION OF DIALYSIS CATHETER Right 06/17/2023   Procedure: INSERTION OF DIALYSIS CATHETER;  Surgeon: Pearline Norman RAMAN, MD;  Location: [facility] OR;  Service: Vascular;  Laterality: Right;  LEFT HEART CATH AND CORONARY ANGIOGRAPHY N/A 07/14/2023   Procedure: LEFT HEART CATH AND CORONARY ANGIOGRAPHY;  Surgeon: Wendel Lurena POUR, MD;  Location: [facility] INVASIVE CV LAB;  Service: Cardiovascular;  Laterality: N/A;   LUMBAR PERCUTANEOUS PEDICLE SCREW 3 LEVEL N/A 05/10/2014   Procedure: Stage 2: Percutaneous Pedicle Screws; Laminectomy L3-4  ;  Surgeon: Fairy Levels, MD;  Location: [facility] NEURO  ORS;  Service: Neurosurgery;  Laterality: N/A;  Stage 2: Percutaneous Pedicle Screws T10 to Ileum; Laminectomy L3-4     RECTAL SURGERY     VAGINA SURGERY  2018   [facility] Touch- Vaginal Dryness     reports that she has never smoked. She has never used smokeless tobacco. 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 She reports that she does not drink alcohol .  Allergies  Allergen Reactions   Coffee Flavoring Agent (Non-Screening) Other (See Comments)    Sick on stomach, sneezing, backed up   Azithromycin  Other (See Comments)   Fluogen [Influenza Virus Vaccine]    Molds & Smuts     Allergy test showed    Pneumococcal Vac Polyvalent Other (See Comments)    Large site reaction    Versed  [Midazolam ] Other (See Comments)    No memory altering medications. Patient request.   Cephalexin  Other (See Comments)    Yeast infection   Ciprofloxacin Other (See Comments)    tendinitis   Oatmeal Other (See Comments)    Sneezing and drainage   Sulfa Antibiotics Other (See Comments)    Yeast infection    Family History  Problem Relation Age of Onset   Multiple myeloma Mother    Cancer Maternal Aunt        Cancer in the mouth - dipped snuff   Heart disease Brother    Diabetes Father    Colon polyps Brother    Heart disease Maternal Uncle    Heart disease Paternal Uncle    Heart disease Paternal Grandfather     Prior to Admission medications   Medication Sig Start Date End Date Taking? Authorizing Provider  aspirin  EC 81 MG tablet Take 1 tablet (81 mg total) by mouth daily. Swallow whole. 06/16/23   Henry Manuelita NOVAK, NP  B-D UF III MINI PEN NEEDLES 31G X 5 MM MISC Inject into the skin 2 (two) times daily. 03/01/23   [provider]  betamethasone valerate (VALISONE) 0.1 % cream Apply 1 Application topically as needed (skin condition). 02/23/23   [provider]  Continuous Glucose Sensor (FREESTYLE LIBRE 3 SENSOR) MISC change every 14 days topically to monitor blood  glucose continuously 28 days 03/01/23   [provider]  ezetimibe  (ZETIA ) 10 MG tablet Take 10 mg by mouth daily.    [provider]  Folic Acid -Vit B6-Vit B12 (FOLBEE) 2.5-25-1 MG TABS tablet Take 1 tablet by mouth daily.    [provider]  furosemide  (LASIX ) 40 MG tablet Take 40 mg by mouth See admin instructions. Pt takes 40 mg on [facility] 07/01/23   [provider]  Lancets [facility] DELICA PLUS [place]) MISC Apply topically 3 (three) times daily. 02/22/23   [provider]  levothyroxine  (SYNTHROID ) 100 MCG tablet Take 100 mcg by mouth See admin instructions. Takes 100 mcg daily except for Monday and Friday she takes 50 mcg    [provider]  nitroGLYCERIN  (NITROSTAT ) 0.4 MG SL tablet Place 1 tablet (0.4 mg total) under the tongue every 5 (five) minutes as needed for chest pain. 06/01/23 08/30/23  Camnitz,  Will Gladis, MD  NOVOLOG  MIX 70/30 FLEXPEN (70-30) 100 UNIT/ML FlexPen Inject into the skin in the morning and at bedtime. Takes 14-15 units in the morning and 9-10 units at before dinner According to how many carbohydrates she eats    [provider]  [facility] VERIO test strip 3 (three) times daily. 03/03/23   [provider]  pantoprazole  (PROTONIX ) 40 MG tablet Take 1.5 tablets by mouth daily as needed (Heartburn). 06/03/23   [provider]    Physical Exam: Vitals:   07/22/23 1530 07/22/23 1545 07/22/23 1600 07/22/23 1700  BP: (!) 143/67 (!) 137/47 (!) 157/79 (!) 173/75  Pulse: 65 (!) 57 (!) 59 76  Resp: 16 (!) 8 15 14   Temp:      TempSrc:      SpO2: 100% 100% 100% 100%     Constitutional: NAD, calm, comfortable Eyes: PERRL, lids and conjunctivae normal ENMT: Mucous membranes are moist. Normal dentition.  Respiratory: Clear to auscultation bilaterally, no wheezing, no crackles. Normal respiratory effort. No accessory muscle use. No conversational dyspnea  Cardiovascular: RRR followed by  episodes of sinus bradycardia and pause  Abdomen: Soft, nondistended, nontender to palpation. Bowel sounds positive.  Musculoskeletal: No joint deformity upper and lower extremities. No contractures. Normal muscle tone.  Skin: no rashes, lesions, ulcers on exposed skin  Neurologic: Alert and oriented, speech fluent, CN 2-12 grossly intact. No focal deficits.   Psychiatric: Normal judgment and insight. Normal mood and affect   Labs on Admission: I have personally reviewed following labs and imaging studies  CBC: Recent Labs  Lab 07/22/23 1352  WBC 5.7  NEUTROABS 2.9  HGB 10.4*  HCT 34.7*  MCV 93.0  PLT 255   Basic Metabolic Panel: Recent Labs  Lab 07/22/23 1352  NA 139  K 3.1*  CL 102  CO2 22  GLUCOSE 168*  BUN 29*  CREATININE 3.75*  CALCIUM  8.8*   GFR: Estimated Creatinine Clearance: 11.4 mL/min (A) (by C-G formula based on SCr of 3.75 mg/dL (H)). Liver Function Tests: No results for input(s): AST, ALT, ALKPHOS, BILITOT, PROT, ALBUMIN in the last 168 hours. No results for input(s): LIPASE, AMYLASE in the last 168 hours. No results for input(s): AMMONIA in the last 168 hours. Coagulation Profile: No results for input(s): INR, PROTIME in the last 168 hours. Cardiac Enzymes: No results for input(s): CKTOTAL, CKMB, CKMBINDEX, TROPONINI in the last 168 hours. BNP (last 3 results) No results for input(s): PROBNP in the last 8760 hours. HbA1C: No results for input(s): HGBA1C in the last 72 hours. CBG: Recent Labs  Lab 07/22/23 1020  GLUCAP 175*   Lipid Profile: No results for input(s): CHOL, HDL, LDLCALC, TRIG, CHOLHDL, LDLDIRECT in the last 72 hours. Thyroid  Function Tests: No results for input(s): TSH, T4TOTAL, FREET4, T3FREE, THYROIDAB in the last 72 hours. Anemia Panel: No results for input(s): VITAMINB12, FOLATE, FERRITIN, TIBC, IRON , RETICCTPCT in the last 72 hours. Urine analysis:     Component Value Date/Time   COLORURINE STRAW (A) 01/03/2023 1102   APPEARANCEUR CLEAR 01/03/2023 1102   LABSPEC 1.009 01/03/2023 1102   PHURINE 6.0 01/03/2023 1102   GLUCOSEU 150 (A) 01/03/2023 1102   HGBUR SMALL (A) 01/03/2023 1102   BILIRUBINUR NEGATIVE 01/03/2023 1102   BILIRUBINUR negative 05/02/2015 1724   BILIRUBINUR neg 03/01/2011 1243   KETONESUR NEGATIVE 01/03/2023 1102   PROTEINUR 100 (A) 01/03/2023 1102   UROBILINOGEN 0.2 05/02/2015 1724   UROBILINOGEN 0.2 03/31/2007 0750   NITRITE NEGATIVE 01/03/2023 1102  LEUKOCYTESUR NEGATIVE 01/03/2023 1102   Sepsis Labs: !!!!!!!!!!!!!!!!!!!!!!!!!!!!!!!!!!!!!!!!!!!! @LABRCNTIP (procalcitonin:4,lacticidven:4) )No results found for this or any previous visit (from the past 240 hours).   Radiological Exams on Admission: DG Chest 2 View Result Date: 07/22/2023 CLINICAL DATA:  syncope EXAM: CHEST - 2 VIEW COMPARISON:  None available. FINDINGS: Right IJ approach hemodialysis catheter, terminating at the cavoatrial junction. Low lung volumes. No focal airspace consolidation, pleural effusion, or pneumothorax. No cardiomegaly. Tortuous aorta with aortic atherosclerosis. No acute fracture or destructive lesions. Multilevel thoracic osteophytosis. Thoracolumbar fusion hardware. IMPRESSION: 1. Low lung volumes.  No acute cardiopulmonary abnormality. 2. Well-positioned right IJ approach hemodialysis catheter. Electronically Signed   By: Rogelia Myers M.D.   On: 07/22/2023 14:28    EKG: Independently reviewed.  Normal sinus rhythm with rate 66 with sinus pause and prolonged PR, prolonged QT  Assessment/Plan Principal Problem:   Symptomatic sinus bradycardia Active Problems:   DM (diabetes mellitus) (HCC)   HTN (hypertension)   Prolonged QT interval   Hyperlipidemia   ESRD on dialysis (HCC)   Symptomatic sinus bradycardia, sinus pause Aortic stenosis - Cardiology consulted, in consideration for permanent pacemaker placement as well as  valve intervention - Continue telemetry, admit to progressive unit  Aortic atherosclerosis - Will hold aspirin , Plavix pending potential procedures  ESRD on HD - Nephrology consulted  Diabetes mellitus - Recent A1c 6.5 - Sliding scale insulin   Hyperlipidemia - Zetia   Hypertension - Off antihypertensives PTA due to low blood pressure  Hypothyroidism - Synthroid      DVT prophylaxis: Subcutaneous heparin  Code Status: DNR, confirmed with patient at bedside.  Sister present during this conversation.  She states that she would not want her ribs to be broken, does not want CPR.  Okay with medication administration Family Communication: Sister at bedside Disposition Plan: Home Consults called: Cardiology, nephrology   Severity of Illness: The appropriate patient status for this patient is INPATIENT. Inpatient status is judged to be reasonable and necessary in order to provide the required intensity of service to ensure the patient's safety. The patient's presenting symptoms, physical exam findings, and initial radiographic and laboratory data in the context of their chronic comorbidities is felt to place them at high risk for further clinical deterioration. Furthermore, it is not anticipated that the patient will be medically stable for discharge from the hospital within 2 midnights of admission.   * I certify that at the point of admission it is my clinical judgment that the patient will require inpatient hospital care spanning beyond 2 midnights from the point of admission due to high intensity of service, high risk for further deterioration and high frequency of surveillance required.DEWAINE Delon Hoe, DO [facility] 07/22/2023, 5:20 PM   Available via Epic secure chat 7am-7pm After these hours, please refer to coverage provider listed on [email or web address]

## 2023-07-23 ENCOUNTER — Encounter (HOSPITAL_COMMUNITY): Payer: Self-pay | Admitting: Cardiology

## 2023-07-23 ENCOUNTER — Encounter (HOSPITAL_COMMUNITY)
Admission: EM | Disposition: A | Discharge status: Home Health | Payer: Self-pay | Source: Home / Self Care | Attending: Cardiology

## 2023-07-23 DIAGNOSIS — I455 Other specified heart block: Secondary | ICD-10-CM

## 2023-07-23 DIAGNOSIS — I495 Sick sinus syndrome: Principal | ICD-10-CM

## 2023-07-23 HISTORY — PX: TEMPORARY PACEMAKER: CATH118268

## 2023-07-23 LAB — CBC
HCT: 32.3 % — ABNORMAL LOW (ref 36.0–46.0)
HCT: 31 % — ABNORMAL LOW (ref 36.0–46.0)
Hemoglobin: 10.1 g/dL — ABNORMAL LOW (ref 12.0–15.0)
Hemoglobin: 9.8 g/dL — ABNORMAL LOW (ref 12.0–15.0)
MCH: 28.1 pg (ref 26.0–34.0)
MCH: 28.4 pg (ref 26.0–34.0)
MCHC: 31.3 g/dL (ref 30.0–36.0)
MCHC: 31.6 g/dL (ref 30.0–36.0)
MCV: 89.7 fL (ref 80.0–100.0)
MCV: 89.9 fL (ref 80.0–100.0)
Platelets: 268 K/uL (ref 150–400)
Platelets: 262 K/uL (ref 150–400)
RBC: 3.6 MIL/uL — ABNORMAL LOW (ref 3.87–5.11)
RBC: 3.45 MIL/uL — ABNORMAL LOW (ref 3.87–5.11)
RDW: 16.8 % — ABNORMAL HIGH (ref 11.5–15.5)
RDW: 16.6 % — ABNORMAL HIGH (ref 11.5–15.5)
WBC: 6.6 K/uL (ref 4.0–10.5)
WBC: 5.5 K/uL (ref 4.0–10.5)
nRBC: 0 % (ref 0.0–0.2)
nRBC: 0 % (ref 0.0–0.2)

## 2023-07-23 LAB — RENAL FUNCTION PANEL
Albumin: 3.2 g/dL — ABNORMAL LOW (ref 3.5–5.0)
Anion gap: 11 (ref 5–15)
BUN: 29 mg/dL — ABNORMAL HIGH (ref 8–23)
CO2: 25 mmol/L (ref 22–32)
Calcium: 8.9 mg/dL (ref 8.9–10.3)
Chloride: 105 mmol/L (ref 98–111)
Creatinine, Ser: 4.03 mg/dL — ABNORMAL HIGH (ref 0.44–1.00)
GFR, Estimated: 11 mL/min — ABNORMAL LOW (ref >60)
Glucose, Bld: 217 mg/dL — ABNORMAL HIGH (ref 70–99)
Phosphorus: 4.1 mg/dL (ref 2.5–4.6)
Potassium: 3.5 mmol/L (ref 3.5–5.1)
Sodium: 141 mmol/L (ref 135–145)

## 2023-07-23 LAB — BASIC METABOLIC PANEL WITH GFR
Anion gap: 11 (ref 5–15)
BUN: 29 mg/dL — ABNORMAL HIGH (ref 8–23)
CO2: 24 mmol/L (ref 22–32)
Calcium: 8.9 mg/dL (ref 8.9–10.3)
Chloride: 104 mmol/L (ref 98–111)
Creatinine, Ser: 3.85 mg/dL — ABNORMAL HIGH (ref 0.44–1.00)
GFR, Estimated: 11 mL/min — ABNORMAL LOW (ref >60)
Glucose, Bld: 215 mg/dL — ABNORMAL HIGH (ref 70–99)
Potassium: 3.8 mmol/L (ref 3.5–5.1)
Sodium: 139 mmol/L (ref 135–145)

## 2023-07-23 LAB — GLUCOSE, CAPILLARY
Glucose-Capillary: 143 mg/dL — ABNORMAL HIGH (ref 70–99)
Glucose-Capillary: 155 mg/dL — ABNORMAL HIGH (ref 70–99)
Glucose-Capillary: 195 mg/dL — ABNORMAL HIGH (ref 70–99)
Glucose-Capillary: 196 mg/dL — ABNORMAL HIGH (ref 70–99)

## 2023-07-23 LAB — TSH: TSH: 1.223 u[IU]/mL (ref 0.350–4.500)

## 2023-07-23 LAB — MRSA NEXT GEN BY PCR, NASAL: MRSA by PCR Next Gen: NOT DETECTED

## 2023-07-23 LAB — HEPATITIS B SURFACE ANTIBODY, QUANTITATIVE: Hep B S AB Quant (Post): <3.5 m[IU]/mL — ABNORMAL LOW

## 2023-07-23 SURGERY — TEMPORARY PACEMAKER
Anesthesia: LOCAL

## 2023-07-23 MED ORDER — IPRATROPIUM-ALBUTEROL 0.5-2.5 (3) MG/3ML IN SOLN: 3.0000 mL | RESPIRATORY_TRACT | Status: DC | PRN

## 2023-07-23 MED ORDER — ORAL CARE MOUTH RINSE: 15.0000 mL | OROMUCOSAL | Status: DC | PRN

## 2023-07-23 MED ORDER — LABETALOL HCL 5 MG/ML IV SOLN: 10.0000 mg | INTRAVENOUS | Status: AC | PRN

## 2023-07-23 MED ORDER — SODIUM CHLORIDE 0.9% FLUSH
3.0000 mL | Freq: Two times a day (BID) | INTRAVENOUS | Status: DC
  Administered 2023-07-23 – 2023-07-31 (×15): 3 mL via INTRAVENOUS

## 2023-07-23 MED ORDER — SODIUM CHLORIDE 0.9% FLUSH
3.0000 mL | INTRAVENOUS | Status: DC | PRN
Start: 2023-07-23 — End: 2023-07-31

## 2023-07-23 MED ORDER — HEPARIN SODIUM (PORCINE) 1000 UNIT/ML IJ SOLN
INTRAMUSCULAR | Status: AC
  Filled 2023-07-23: qty 4

## 2023-07-23 MED ORDER — LIDOCAINE HCL (PF) 1 % IJ SOLN
INTRAMUSCULAR | Status: AC
  Filled 2023-07-23: qty 30

## 2023-07-23 MED ORDER — METOPROLOL TARTRATE 5 MG/5ML IV SOLN: 5.0000 mg | INTRAVENOUS | Status: DC | PRN

## 2023-07-23 MED ORDER — FUROSEMIDE 40 MG PO TABS
40.0000 mg | ORAL_TABLET | ORAL | Status: DC
  Administered 2023-07-24 – 2023-07-31 (×5): 40 mg via ORAL
  Filled 2023-07-23 (×5): qty 1

## 2023-07-23 MED ORDER — HYDRALAZINE HCL 20 MG/ML IJ SOLN: 10.0000 mg | INTRAMUSCULAR | Status: DC | PRN

## 2023-07-23 MED ORDER — SODIUM CHLORIDE 0.9 % IV SOLN
INTRAVENOUS | Status: AC | PRN
  Administered 2023-07-23: 10 mL/h via INTRAVENOUS

## 2023-07-23 MED ORDER — CARMEX CLASSIC LIP BALM EX OINT
TOPICAL_OINTMENT | CUTANEOUS | Status: DC | PRN
  Filled 2023-07-23: qty 10

## 2023-07-23 MED ORDER — SODIUM CHLORIDE 0.9 % IV SOLN
250.0000 mL | INTRAVENOUS | Status: AC | PRN
  Administered 2023-07-24: 250 mL via INTRAVENOUS

## 2023-07-23 MED ORDER — DARBEPOETIN ALFA 40 MCG/0.4ML IJ SOSY
40.0000 ug | PREFILLED_SYRINGE | INTRAMUSCULAR | Status: DC
  Administered 2023-07-23: 40 ug via SUBCUTANEOUS
  Filled 2023-07-23 (×2): qty 0.4

## 2023-07-23 MED ORDER — LIDOCAINE HCL (PF) 1 % IJ SOLN
INTRAMUSCULAR | Status: DC | PRN
Start: 2023-07-23 — End: 2023-07-23
  Administered 2023-07-23: 5 mL

## 2023-07-23 SURGICAL SUPPLY — 6 items
CABLE ADAPT PACING TEMP 12FT (ADAPTER) IMPLANT
KIT MICROPUNCTURE NIT STIFF (SHEATH) IMPLANT
SHEATH PINNACLE 6F 10CM (SHEATH) IMPLANT
SHEATH PROBE COVER 6X72 (BAG) IMPLANT
SHIELD CATH-GARD CONTAMINATION (MISCELLANEOUS) IMPLANT
WIRE PACING TEMP ST TIP 5 (CATHETERS) IMPLANT

## 2023-07-23 NOTE — Inpatient Diabetes Management (Signed)
 Inpatient Diabetes Program Recommendations  AACE/ADA: New Consensus Statement on Inpatient Glycemic Control (2015)  Target Ranges:  Prepandial:   less than 140 mg/dL      Peak postprandial:   less than 180 mg/dL (1-2 hours)      Critically ill patients:  140 - 180 mg/dL   Lab Results  Component Value Date   GLUCAP 196 (H) 07/23/2023   HGBA1C 6.5 (H) 06/17/2023    Latest Reference Range & Units 07/22/23 10:20 07/22/23 18:56 07/22/23 21:32 07/23/23 09:51  Glucose-Capillary 70 - 99 mg/dL 824 (H) 849 (H) 815 (H) 196 (H)  (H): Data is abnormally high  Diabetes history: DM2 Outpatient Diabetes medications:  70/30 14-15 units ac breakfast, 9-10 units ac dinner Current orders for Inpatient glycemic control: Novolog  0-6 units tid correction  Inpatient Diabetes Program Recommendations:   Received consult from RN regarding patient concerned change in insulin  dosing in the hospital. Spoke with patient via phone (DM coordinator on system wide call) and reviewed with patient. Patient states understanding and support of changes. She is currently NPO for testing. Will follow while hospitalized.  Thank you, Zena Vitelli E. Marek Nghiem, RN, MSN, CDCES  Diabetes Coordinator Inpatient Glycemic Control Team Team Pager [phone] (8am-5pm) 07/23/2023 10:48 AM

## 2023-07-23 NOTE — Consult Note (Signed)
 [facility] Renal Consultation Note    Indication for Consultation:  Management of ESRD/hemodialysis; anemia, hypertension/volume and secondary hyperparathyroidism PCP: Ravisankar Avva Nephrologist: Dr. 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 PMH: HTN, DMT2 severe AS being worked up for TAVR, symptomatic bradycardia with plans for PPM. HFrEF G2DD. Last HD 07/20/2023.  Patient admitted 07/22/2023 after placement of [facility] per [facility]. She reports symptoms worsened following TDC exchange. She has been seen by EP (Dr. Antonetta and plan are in place for placement of Temp pacing wire this AM as soon as possible.  Seen in room, HR irregular with sinus pauses 8-12 second. She is very pleasant, alert, oriented X 3. She can feel pauses, but remains alert.  She denies chest pain/sob. Volume appears acceptable. We will order HD as separate as she will be moved to [facility].   Past Medical History:  Diagnosis Date   Anemia    CKD (chronic kidney disease) stage 5, GFR less than 15 ml/min (HCC)    Cystocele    DJD (degenerative joint disease)    DM (diabetes mellitus) (HCC)    Esophageal stricture    Essential hypertension    Hyperlipidemia    Hypothyroidism    Lichen sclerosus    Osteopenia    RBBB with left anterior fascicular block    Rectocele    Stress incontinence    Torn rotator cuff    Bilateral   Type 2 diabetes mellitus (HCC)    Venous insufficiency    Vitreous hemorrhage, left (HCC) 05/25/2019   The nature of the vitreous hemorrhage was discussed with the patient as well as the common causes.   Patients with diabetic eye disease may develop retinal neovascularization.  Other eye conditions develop retinal  neovascularization secondary to retinal venous occlusions.   Vitreous hemorrhage may result from spontaneous vitreous detachment or retinal breaks.  Blunt trauma is a common cause as wl    Past Surgical History:  Procedure Laterality Date   ABDOMINAL AORTOGRAM N/A 07/14/2023   Procedure: ABDOMINAL AORTOGRAM;  Surgeon: Wendel Lurena POUR, MD;  Location: [facility] INVASIVE CV LAB;  Service: Cardiovascular;  Laterality: N/A;   ABDOMINAL EXPOSURE N/A 05/09/2014   Procedure: ABDOMINAL EXPOSURE;  Surgeon: Krystal JULIANNA Doing, MD;  Location: [facility] NEURO ORS;  Service: Vascular;  Laterality: N/A;   ANTERIOR LAT LUMBAR FUSION Right 05/09/2014   Procedure: Lumbar three-four, Lumbar four-five Anterior lateral lumbar fusion;  Surgeon: Fairy Levels, MD;  Location: [facility] NEURO ORS;  Service: Neurosurgery;  Laterality: Right;   ANTERIOR LUMBAR FUSION N/A 05/09/2014   Procedure: Stage 1:Lumbar four-five, Lumbar five-Sacral one Anterior lumbar interbody fusion with Dr. Doing for approach ;  Surgeon: Fairy Levels, MD;  Location: [facility] NEURO ORS;  Service: Neurosurgery;  Laterality: N/A;   CHOLECYSTECTOMY  1987   INCONTINENCE SURGERY     INSERTION OF DIALYSIS CATHETER Right 06/17/2023   Procedure: INSERTION OF DIALYSIS CATHETER;  Surgeon: Pearline Norman RAMAN, MD;  Location: [facility];  Service: Vascular;  Laterality: Right;   LEFT HEART CATH AND CORONARY ANGIOGRAPHY N/A 07/14/2023   Procedure: LEFT HEART CATH AND CORONARY ANGIOGRAPHY;  Surgeon: Wendel Lurena POUR, MD;  Location: [facility] INVASIVE CV LAB;  Service: Cardiovascular;  Laterality: N/A;   LUMBAR PERCUTANEOUS PEDICLE SCREW 3 LEVEL N/A 05/10/2014   Procedure: Stage 2: Percutaneous Pedicle Screws; Laminectomy L3-4  ;  Surgeon: Fairy Levels, MD;  Location: [facility] NEURO ORS;  Service: Neurosurgery;  Laterality: N/A;  Stage 2: Percutaneous Pedicle Screws T10 to Ileum; Laminectomy L3-4     RECTAL SURGERY     VAGINA SURGERY  2018   Almetta Planas Touch- Vaginal Dryness   Family History  Problem Relation Age of Onset   Multiple myeloma Mother    Cancer Maternal Aunt        Cancer in the mouth - dipped snuff   Heart disease Brother    Diabetes Father    Colon polyps Brother    Heart  disease Maternal Uncle    Heart disease Paternal Uncle    Heart disease Paternal Grandfather    Social History:  reports that she has never smoked. She has never used smokeless tobacco. 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 She reports that she does not drink alcohol . Allergies  Allergen Reactions   Coffee Flavoring Agent (Non-Screening) Other (See Comments)    Sick on stomach, sneezing, backed up   Azithromycin  Other (See Comments)   Fluogen [Influenza Virus Vaccine]    Molds & Smuts     Allergy test showed    Pneumococcal Vac Polyvalent Other (See Comments)    Large site reaction    Tobacco Nausea And Vomiting   Versed  [Midazolam ] Other (See Comments)    No memory altering medications. Patient request.   Cephalexin  Other (See Comments)    Yeast infection   Ciprofloxacin Other (See Comments)    tendinitis   Oatmeal Other (See Comments)    Sneezing and drainage   Sulfa Antibiotics Other (See Comments)    Yeast infection   Prior to Admission medications   Medication Sig Start Date End Date Taking? Authorizing Provider  aspirin  EC 81 MG tablet Take 1 tablet (81 mg total) by mouth daily. Swallow whole. 06/16/23  Yes Henry Shaver B, NP  betamethasone valerate (VALISONE) 0.1 % cream Apply 1 Application topically as needed (skin condition). 02/23/23  Yes [provider]  Coenzyme Q10 (COQ-10 PO) Take 1 tablet by mouth at bedtime.   Yes [provider]  D-MANNOSE PO Take 1 tablet by mouth at bedtime.   Yes [provider]  ezetimibe  (ZETIA ) 10 MG tablet Take 10 mg by mouth daily.   Yes [provider]  Folic Acid -Vit B6-Vit B12 (FOLBEE) 2.5-25-1 MG TABS tablet Take 1 tablet by mouth at bedtime.   Yes [provider]  furosemide  (LASIX ) 40 MG tablet Take 40 mg by mouth See admin instructions. Pt takes 40 mg on Wed,Sat,Sundays 07/01/23  Yes [provider]  levothyroxine  (SYNTHROID ) 100 MCG tablet Take 100 mcg by mouth See  admin instructions. Takes 100 mcg daily except for Monday and Friday she takes 50 mcg   Yes [provider]  nitroGLYCERIN  (NITROSTAT ) 0.4 MG SL tablet Place 1 tablet (0.4 mg total) under the tongue every 5 (five) minutes as needed for chest pain. 06/01/23 08/30/23 Yes Camnitz, Will Gladis, MD  NOVOLOG  MIX 70/30 FLEXPEN (70-30) 100 UNIT/ML FlexPen Inject into the skin in the morning and at bedtime. Takes 14-15 units in the morning and 9-10 units at before dinner According to how many carbohydrates she eats   Yes [provider]  pantoprazole  (PROTONIX ) 40 MG tablet Take 1.5 tablets by mouth daily as needed (Heartburn). 06/03/23  Yes [provider]  B-D UF III MINI PEN NEEDLES 31G X 5 MM MISC Inject into the skin 2 (two) times daily. 03/01/23   [provider]  Continuous Glucose Sensor (FREESTYLE LIBRE 3  SENSOR) MISC change every 14 days topically to monitor blood glucose continuously 28 days 03/01/23   [provider]  Lancets [facility] DELICA PLUS [place]) MISC Apply topically 3 (three) times daily. 02/22/23   [provider]  [facility] VERIO test strip 3 (three) times daily. 03/03/23   [provider]   Current Facility-Administered Medications  Medication Dose Route Frequency Provider Last Rate Last Admin   acetaminophen  (TYLENOL ) tablet 650 mg  650 mg Oral Q6H PRN Rojelio Nest, DO       Or   acetaminophen  (TYLENOL ) suppository 650 mg  650 mg Rectal Q6H PRN Rojelio Nest, DO       alteplase  (CATHFLO ACTIVASE ) injection 2 mg  2 mg Intracatheter Once PRN Geralynn Charleston, MD       anticoagulant sodium citrate  solution 5 mL  5 mL Intracatheter PRN Geralynn Charleston, MD       Chlorhexidine  Gluconate Cloth 2 % PADS 6 each  6 each Topical Q0600 Dennise Hoes, MD   6 each at 07/23/23 0600   ezetimibe  (ZETIA ) tablet 10 mg  10 mg Oral QHS Rojelio Nest, DO   10 mg at 07/22/23 2120   feeding supplement (NEPRO CARB STEADY) liquid 237 mL  237 mL  Oral PRN Geralynn Charleston, MD       heparin  injection 1,000 Units  1,000 Units Intracatheter PRN Geralynn Charleston, MD       heparin  injection 1,600 Units  20 Units/kg Dialysis PRN Dennise Hoes, MD       heparin  injection 2,000 Units  2,000 Units Dialysis Once in dialysis Geralynn Charleston, MD       heparin  injection 2,000 Units  2,000 Units Dialysis PRN Geralynn Charleston, MD       heparin  injection 5,000 Units  5,000 Units Subcutaneous Q8H Rojelio Nest, DO   5,000 Units at 07/23/23 9183   hydrALAZINE  (APRESOLINE ) injection 10 mg  10 mg Intravenous Q4H PRN Amin, Ankit C, MD       insulin  aspart (novoLOG ) injection 0-6 Units  0-6 Units Subcutaneous TID WC Rojelio Nest, DO   1 Units at 07/22/23 2237   ipratropium-albuterol  (DUONEB) 0.5-2.5 (3) MG/3ML nebulizer solution 3 mL  3 mL Nebulization Q4H PRN Amin, Ankit C, MD       [START ON 07/24/2023] levothyroxine  (SYNTHROID ) tablet 100 mcg  100 mcg Oral Once per day on Sunday Tuesday Wednesday Thursday Saturday Rojelio Nest, DO       And   levothyroxine  (SYNTHROID ) tablet 50 mcg  50 mcg Oral Once per day on Monday Friday Rojelio Nest, DO   50 mcg at 07/23/23 9183   lidocaine  (PF) (XYLOCAINE ) 1 % injection 5 mL  5 mL Intradermal PRN Geralynn Charleston, MD       lidocaine -prilocaine  (EMLA ) cream 1 Application  1 Application Topical PRN Geralynn Charleston, MD       metoprolol  tartrate (LOPRESSOR ) injection 5 mg  5 mg Intravenous Q4H PRN Amin, Ankit C, MD       pentafluoroprop-tetrafluoroeth (GEBAUERS) aerosol 1 Application  1 Application Topical PRN Geralynn Charleston, MD       Labs: Basic Metabolic Panel: Recent Labs  Lab 07/22/23 1352 07/22/23 2305 07/22/23 2307  NA 139 141 139  K 3.1* 3.5 3.8  CL 102 105 104  CO2 22 25 24   GLUCOSE 168* 217* 215*  BUN 29* 29* 29*  CREATININE 3.75* 4.03* 3.85*  CALCIUM  8.8* 8.9 8.9  PHOS  --  4.1  --    Liver Function Tests:  Recent Labs  Lab 07/22/23 2305  ALBUMIN  3.2*   No results for input(s): LIPASE,  AMYLASE in the last 168 hours. No results for input(s): AMMONIA in the last 168 hours. CBC: Recent Labs  Lab 07/22/23 1352 07/22/23 2305  WBC 5.7 6.6  NEUTROABS 2.9  --   HGB 10.4* 10.1*  HCT 34.7* 32.3*  MCV 93.0 89.7  PLT 255 268   Cardiac Enzymes: No results for input(s): CKTOTAL, CKMB, CKMBINDEX, TROPONINI in the last 168 hours. CBG: Recent Labs  Lab 07/22/23 1020 07/22/23 1856 07/22/23 2132  GLUCAP 175* 150* 184*   Iron  Studies: No results for input(s): IRON , TIBC, TRANSFERRIN, FERRITIN in the last 72 hours. Studies/Results: DG Chest 2 View Result Date: 07/22/2023 CLINICAL DATA:  syncope EXAM: CHEST - 2 VIEW COMPARISON:  None available. FINDINGS: Right IJ approach hemodialysis catheter, terminating at the cavoatrial junction. Low lung volumes. No focal airspace consolidation, pleural effusion, or pneumothorax. No cardiomegaly. Tortuous aorta with aortic atherosclerosis. No acute fracture or destructive lesions. Multilevel thoracic osteophytosis. Thoracolumbar fusion hardware. IMPRESSION: 1. Low lung volumes.  No acute cardiopulmonary abnormality. 2. Well-positioned right IJ approach hemodialysis catheter. Electronically Signed   By: Rogelia Myers M.D.   On: 07/22/2023 14:28    ROS: As per HPI otherwise negative.  Physical Exam: Vitals:   07/22/23 2100 07/22/23 2330 07/23/23 0406 07/23/23 0802  BP: (!) 120/50 (!) 144/76 (!) 128/57 113/64  Pulse: (!) 54 (!) 51  (!) 44  Resp:  20 16 16   Temp:  98 F (36.7 C) 98.3 F (36.8 C) 97.8 F (36.6 C)  TempSrc:  Oral Oral Oral  SpO2: 98% 100% 100%      General: Very pleasant elderly female in no acute distress. Head: Normocephalic, atraumatic, sclera non-icteric, mucus membranes are moist Neck: Supple. JVD not elevated. Lungs: CTAB A/P Heart: HS irregular with pauses. 2/6 systolic M Abdomen: NABS, NT.SABRA Lower extremities:No LE edema Neuro: Alert and oriented X 3. Moves all extremities  spontaneously. Psych:  Responds to questions appropriately with a normal affect. Dialysis Access: New RIJ [facility] with soiled dressing from insertion 07/22/2023  Dialysis Orders: [facility] M,T,Th,S 3 hrs 180NRe 350/Autoflow 1.5 80 kg 2.0 K/ 2.0 Ca TDC - Heparin  2000 units IV three times per week - Mircera 75 mcg IV q 2 weeks (last dose 07/13/2023 next dose due 07/21/2023-did not receive) - Venofer  50 mg IV q week  Assessment/Plan:  Symptomatic Bradycardia with pauses-EP consulted. Going for T-wire this AM. Plans for PPM later. Per primary/EP AS-being worked up for TAVR. Per primary/Cards   ESRD -  Will have HD MWF while in hospital. HD today. Increase time to 3.5 hours.   Hypertension/volume  - BP and volume controlled. Continue Furosemide  on non HD days. UF as tolerated.   Anemia  - HGB 10.1. Give ESA today with HD.   Metabolic bone disease -  BMD labs at goal. No binders/ VDRA yet  Nutrition - K+ on low side. Regular diet with fluid restrictions.   Lynel Forester H. Delores, NP-C 07/23/2023, 9:06 AM  [employer] [phone]

## 2023-07-23 NOTE — H&P (View-Only) (Signed)
 Cardiology Consultation   [name] MRN: [id number]; DOB: 27-Apr-1945  Admit date: 07/22/2023 Date of Consult: 07/23/2023  PCP:  [name], MD   [facility] Providers Cardiologist:  Lurena MARLA Red, MD  Electrophysiologist:  Soyla Gladis Norton, MD       Patient Profile: Rhonda Clayton is a 78 y.o. female with a hx of low-flow low gradient aortic stenosis, diabetes, hyperlipidemia, hypertension, end-stage renal disease, trifascicular block who is being seen 07/23/2023 for the evaluation of syncope at the request of [name].  History of Present Illness: Ms. Rhonda Clayton has a history of aortic stenosis and has plans for possible upcoming TAVR.  She has had multiple syncopal episodes over the last few days.  While in the hospital, she has had multiple sinus arrest episodes, up to 8 seconds in length.  She has been having dizzy, lightheaded episodes while in the hospital that correlates to her pauses.  She otherwise feels well.  She has been tolerating dialysis without issue.   Past Medical History:  Diagnosis Date   Anemia    CKD (chronic kidney disease) stage 5, GFR less than 15 ml/min (HCC)    Cystocele    DJD (degenerative joint disease)    DM (diabetes mellitus) (HCC)    Esophageal stricture    Essential hypertension    Hyperlipidemia    Hypothyroidism    Lichen sclerosus    Osteopenia    RBBB with left anterior fascicular block    Rectocele    Stress incontinence    Torn rotator cuff    Bilateral   Type 2 diabetes mellitus (HCC)    Venous insufficiency    Vitreous hemorrhage, left (HCC) 05/25/2019   The nature of the vitreous hemorrhage was discussed with the patient as well as the common causes.   Patients with diabetic eye disease may develop retinal neovascularization.  Other eye conditions develop retinal  neovascularization secondary to retinal venous occlusions.   Vitreous hemorrhage may result from spontaneous vitreous detachment or  retinal breaks.  Blunt trauma is a common cause as wl    Past Surgical History:  Procedure Laterality Date   ABDOMINAL AORTOGRAM N/A 07/14/2023   Procedure: ABDOMINAL AORTOGRAM;  Surgeon: Red Lurena MARLA, MD;  Location: [facility];  Service: Cardiovascular;  Laterality: N/A;   ABDOMINAL EXPOSURE N/A 05/09/2014   Procedure: ABDOMINAL EXPOSURE;  Surgeon: Krystal JULIANNA Doing, MD;  Location: [facility];  Service: Vascular;  Laterality: N/A;   ANTERIOR LAT LUMBAR FUSION Right 05/09/2014   Procedure: Lumbar three-four, Lumbar four-five Anterior lateral lumbar fusion;  Surgeon: Fairy Levels, MD;  Location: [facility];  Service: Neurosurgery;  Laterality: Right;   ANTERIOR LUMBAR FUSION N/A 05/09/2014   Procedure: Stage 1:Lumbar four-five, Lumbar five-Sacral one Anterior lumbar interbody fusion with Dr. Doing for approach ;  Surgeon: Fairy Levels, MD;  Location: [facility];  Service: Neurosurgery;  Laterality: N/A;   CHOLECYSTECTOMY  1987   INCONTINENCE SURGERY     INSERTION OF DIALYSIS CATHETER Right 06/17/2023   Procedure: INSERTION OF DIALYSIS CATHETER;  Surgeon: Pearline Norman RAMAN, MD;  Location: [facility];  Service: Vascular;  Laterality: Right;   LEFT HEART CATH AND CORONARY ANGIOGRAPHY N/A 07/14/2023   Procedure: LEFT HEART CATH AND CORONARY ANGIOGRAPHY;  Surgeon: Red Lurena MARLA, MD;  Location: [facility];  Service: Cardiovascular;  Laterality: N/A;   LUMBAR PERCUTANEOUS PEDICLE SCREW 3 LEVEL N/A 05/10/2014   Procedure: Stage 2: Percutaneous Pedicle  Screws; Laminectomy L3-4  ;  Surgeon: Fairy Levels, MD;  Location: [facility];  Service: Neurosurgery;  Laterality: N/A;  Stage 2: Percutaneous Pedicle Screws T10 to Ileum; Laminectomy L3-4     RECTAL SURGERY     VAGINA SURGERY  2018   Almetta Planas Touch- Vaginal Dryness     Home Medications:  Prior to Admission medications   Medication Sig Start Date End Date Taking? Authorizing Provider  aspirin  EC 81 MG tablet Take 1 tablet (81 mg  total) by mouth daily. Swallow whole. 06/16/23  Yes Henry Shaver B, NP  betamethasone valerate (VALISONE) 0.1 % cream Apply 1 Application topically as needed (skin condition). 02/23/23  Yes [provider]  Coenzyme Q10 (COQ-10 PO) Take 1 tablet by mouth at bedtime.   Yes [provider]  D-MANNOSE PO Take 1 tablet by mouth at bedtime.   Yes [provider]  ezetimibe  (ZETIA ) 10 MG tablet Take 10 mg by mouth daily.   Yes [provider]  Folic Acid -Vit B6-Vit B12 (FOLBEE) 2.5-25-1 MG TABS tablet Take 1 tablet by mouth at bedtime.   Yes [provider]  furosemide  (LASIX ) 40 MG tablet Take 40 mg by mouth See admin instructions. Pt takes 40 mg on Wed,Sat,Sundays 07/01/23  Yes [provider]  levothyroxine  (SYNTHROID ) 100 MCG tablet Take 100 mcg by mouth See admin instructions. Takes 100 mcg daily except for Monday and Friday she takes 50 mcg   Yes [provider]  nitroGLYCERIN  (NITROSTAT ) 0.4 MG SL tablet Place 1 tablet (0.4 mg total) under the tongue every 5 (five) minutes as needed for chest pain. 06/01/23 08/30/23 Yes Nakeeta Sebastiani, Soyla Lunger, MD  NOVOLOG  MIX 70/30 FLEXPEN (70-30) 100 UNIT/ML FlexPen Inject into the skin in the morning and at bedtime. Takes 14-15 units in the morning and 9-10 units at before dinner According to how many carbohydrates she eats   Yes [provider]  pantoprazole  (PROTONIX ) 40 MG tablet Take 1.5 tablets by mouth daily as needed (Heartburn). 06/03/23  Yes [provider]  B-D UF III MINI PEN NEEDLES 31G X 5 MM MISC Inject into the skin 2 (two) times daily. 03/01/23   [provider]  Continuous Glucose Sensor (FREESTYLE LIBRE 3 SENSOR) MISC change every 14 days topically to monitor blood glucose continuously 28 days 03/01/23   [provider]  Lancets [facility] DELICA PLUS [place]) MISC Apply topically 3 (three) times daily. 02/22/23   [provider]  [facility] VERIO  test strip 3 (three) times daily. 03/03/23   [provider]    Scheduled Meds:  Chlorhexidine  Gluconate Cloth  6 each Topical Q0600   ezetimibe   10 mg Oral QHS   heparin   2,000 Units Dialysis Once in dialysis   heparin   5,000 Units Subcutaneous Q8H   insulin  aspart  0-6 Units Subcutaneous TID WC   [START ON 07/24/2023] levothyroxine   100 mcg Oral Once per day on Sunday Tuesday Wednesday Thursday Saturday   And   levothyroxine   50 mcg Oral Once per day on Monday Friday   Continuous Infusions:  anticoagulant sodium citrate      PRN Meds: acetaminophen  **OR** acetaminophen , alteplase , anticoagulant sodium citrate , feeding supplement (NEPRO CARB STEADY), heparin , heparin , heparin , hydrALAZINE , ipratropium-albuterol , lidocaine  (PF), lidocaine -prilocaine , metoprolol  tartrate, pentafluoroprop-tetrafluoroeth  Allergies:    Allergies  Allergen Reactions   Coffee Flavoring Agent (Non-Screening) Other (See Comments)    Sick on stomach, sneezing, backed up   Azithromycin  Other (See Comments)   Fluogen [Influenza Virus  Vaccine]    Molds & Smuts     Allergy test showed    Pneumococcal Vac Polyvalent Other (See Comments)    Large site reaction    Tobacco Nausea And Vomiting   Versed  [Midazolam ] Other (See Comments)    No memory altering medications. Patient request.   Cephalexin  Other (See Comments)    Yeast infection   Ciprofloxacin Other (See Comments)    tendinitis   Oatmeal Other (See Comments)    Sneezing and drainage   Sulfa Antibiotics Other (See Comments)    Yeast infection    Social History:   Social History   Socioeconomic History   Marital status: Widowed    Spouse name: Not on file   Number of children: 2   Years of education: Not on file   Highest education level: Not on file  Occupational History   Occupation: Retired  Tobacco Use   Smoking status: Never   Smokeless tobacco: Never  Vaping Use   Vaping status: Never Used  Substance and Sexual Activity    Alcohol  use: No   Drug use: Yes    Types: Hydromorphone    Sexual activity: Not on file  Other Topics Concern   Not on file  Social History Narrative   Not on file   Social Drivers of Health   Financial Resource Strain: Not on file  Food Insecurity: No Food Insecurity (07/22/2023)   Hunger Vital Sign    Worried About Running Out of Food in the Last Year: Never true    Ran Out of Food in the Last Year: Never true  Transportation Needs: No Transportation Needs (07/22/2023)   PRAPARE - Administrator, Civil Service (Medical): No    Lack of Transportation (Non-Medical): No  Physical Activity: Not on file  Stress: Not on file  Social Connections: Unknown (07/22/2023)   Social Connection and Isolation Panel    Frequency of Communication with Friends and Family: More than three times a week    Frequency of Social Gatherings with Friends and Family: More than three times a week    Attends Religious Services: Not on Insurance claims handler of Clubs or Organizations: Yes    Attends Banker Meetings: Never    Marital Status: Patient declined  Intimate Partner Violence: Unknown (07/22/2023)   Humiliation, Afraid, Rape, and Kick questionnaire    Fear of Current or Ex-Partner: No    Emotionally Abused: No    Physically Abused: Not on file    Sexually Abused: No    Family History:    Family History  Problem Relation Age of Onset   Multiple myeloma Mother    Cancer Maternal Aunt        Cancer in the mouth - dipped snuff   Heart disease Brother    Diabetes Father    Colon polyps Brother    Heart disease Maternal Uncle    Heart disease Paternal Uncle    Heart disease Paternal Grandfather      ROS:  Please see the history of present illness.   All other ROS reviewed and negative.     Physical Exam/Data: Vitals:   07/22/23 2100 07/22/23 2330 07/23/23 0406 07/23/23 0802  BP: (!) 120/50 (!) 144/76 (!) 128/57 113/64  Pulse: (!) 54 (!) 51  (!) 44  Resp:  20 16 16    Temp:  98 F (36.7 C) 98.3 F (36.8 C) 97.8 F (36.6 C)  TempSrc:  Oral Oral Oral  SpO2: 98% 100% 100%     Intake/Output Summary (Last 24 hours) at 07/23/2023 0844 Last data filed at 07/22/2023 2354 Gross per 24 hour  Intake 240 ml  Output 500 ml  Net -260 ml      07/14/2023   10:07 AM 07/12/2023    1:11 PM 07/05/2023   10:18 AM  Last 3 Weights  Weight (lbs) 173 lb 173 lb 179 lb 9.6 oz  Weight (kg) 78.472 kg 78.472 kg 81.466 kg     There is no height or weight on file to calculate BMI.  General:  Well nourished, well developed, in no acute distress HEENT: normal Neck: no JVD Vascular: No carotid bruits; Distal pulses 2+ bilaterally Cardiac:  normal S1, S2; RRR; 2/6 systolic murmur  Lungs:  clear to auscultation bilaterally, no wheezing, rhonchi or rales  Abd: soft, nontender, no hepatomegaly  Ext: no edema Musculoskeletal:  No deformities, BUE and BLE strength normal and equal Skin: warm and dry  Neuro:  CNs 2-12 intact, no focal abnormalities noted Psych:  Normal affect   EKG:  The EKG was personally reviewed and demonstrates: Sinus rhythm with intermittent sinus arrest Telemetry:  Telemetry was personally reviewed and demonstrates: Sinus rhythm with intermittent sinus arrest  Laboratory Data: High Sensitivity Troponin:   Recent Labs  Lab 07/22/23 1352 07/22/23 1600  TROPONINIHS 41* 40*     Chemistry Recent Labs  Lab 07/22/23 1352 07/22/23 2305 07/22/23 2307  NA 139 141 139  K 3.1* 3.5 3.8  CL 102 105 104  CO2 22 25 24   GLUCOSE 168* 217* 215*  BUN 29* 29* 29*  CREATININE 3.75* 4.03* 3.85*  CALCIUM  8.8* 8.9 8.9  GFRNONAA 12* 11* 11*  ANIONGAP 15 11 11     Recent Labs  Lab 07/22/23 2305  ALBUMIN  3.2*   Lipids No results for input(s): CHOL, TRIG, HDL, LABVLDL, LDLCALC, CHOLHDL in the last 168 hours.  Hematology Recent Labs  Lab 07/22/23 1352 07/22/23 2305  WBC 5.7 6.6  RBC 3.73* 3.60*  HGB 10.4* 10.1*  HCT 34.7* 32.3*  MCV 93.0 89.7   MCH 27.9 28.1  MCHC 30.0 31.3  RDW 17.0* 16.8*  PLT 255 268   Thyroid   Recent Labs  Lab 07/22/23 2307  TSH 1.223    BNP Recent Labs  Lab 07/22/23 1456  BNP 1,346.7*    DDimer No results for input(s): DDIMER in the last 168 hours.  Radiology/Studies:  DG Chest 2 View Result Date: 07/22/2023 CLINICAL DATA:  syncope EXAM: CHEST - 2 VIEW COMPARISON:  None available. FINDINGS: Right IJ approach hemodialysis catheter, terminating at the cavoatrial junction. Low lung volumes. No focal airspace consolidation, pleural effusion, or pneumothorax. No cardiomegaly. Tortuous aorta with aortic atherosclerosis. No acute fracture or destructive lesions. Multilevel thoracic osteophytosis. Thoracolumbar fusion hardware. IMPRESSION: 1. Low lung volumes.  No acute cardiopulmonary abnormality. 2. Well-positioned right IJ approach hemodialysis catheter. Electronically Signed   By: Rogelia Myers M.D.   On: 07/22/2023 14:28     Assessment and Plan: Sinus arrest: Patient is having prolonged pauses, up to 8 seconds in length.  She Glora Hulgan need pacemaker implant.  She is on no rate controlling medications.  Due to the prolonged nature of the pauses, we Shasta Chinn plan for temporary wire implantation.  She Navika Hoopes likely need leadless pacemaker implant due to end-stage renal disease ESRD: Dialysis per nephrology Aorta stenosis: Potential plan for TAVR.  Chiyo Fay need to discuss with structural team next week. Multi vessel coronary disease: No current chest  pain.  Plan for medical management.   Risk Assessment/Risk Scores:   For questions or updates, please contact [facility] Please consult [email or web address] for contact info under    Signed, Achol Azpeitia Gladis Norton, MD  07/23/2023 8:44 AM

## 2023-07-23 NOTE — Interval H&P Note (Signed)
 History and Physical Interval Note:  07/23/2023 11:50 AM  Rhonda Clayton  has presented today for surgery, with the diagnosis of bradycardia.  The various methods of treatment have been discussed with the patient and family. After consideration of risks, benefits and other options for treatment, the patient has consented to  Procedure(s): TEMPORARY PACEMAKER (N/A) as a surgical intervention.  The patient's history has been reviewed, patient examined, no change in status, stable for surgery.  I have reviewed the patient's chart and labs.  Questions were answered to the patient's satisfaction.     [name]

## 2023-07-23 NOTE — Hospital Course (Addendum)
 Brief Narrative:   78 year old with history of aortic stenosis, DM 2, HTN, HLD, ESRD on HD admitted to the hospital for evaluation of syncope.  Noted to have 3-4.5-second sinus pauses.  Cardiology team was consulted.  Assessment & Plan:  Principal Problem:   Symptomatic sinus bradycardia Active Problems:   DM (diabetes mellitus) (HCC)   HTN (hypertension)   Prolonged QT interval   Hyperlipidemia   ESRD on dialysis (HCC)    Symptomatic sinus bradycardia, sinus pause Aortic stenosis -Patient admitted to the hospital, cardiology/EP team following.  Likely will require pacemaker, will defer decision to their service, for now temporary wire implantation planned - TSH normal  Echocardiogram 05/2023-EF 40%, grade 2 DD, severe hypokinesia of the left wall, dilated LA LHC 07/14/2023-multivessel CAD   Aortic atherosclerosis - Will hold aspirin , Plavix pending potential procedures   ESRD on HD - Nephrology consulted   Diabetes mellitus - Recent A1c 6.5 - Sliding scale insulin    Hyperlipidemia - Zetia    Hypertension - Off antihypertensives PTA due to low blood pressure   Hypothyroidism - Synthroid        DVT prophylaxis: heparin  injection 5,000 Units Start: 07/22/23 2200    Code Status: Do not attempt resuscitation (DNR) PRE-ARREST INTERVENTIONS DESIRED Family Communication: Family at bedside Status is: Inpatient Ongoing evaluation by cardiology    Subjective: Seen at bedside no complaints.   Examination:  General exam: Appears calm and comfortable  Respiratory system: Clear to auscultation. Respiratory effort normal. Cardiovascular system: S1 & S2 heard, RRR. No JVD, murmurs, rubs, gallops or clicks. No pedal edema. Gastrointestinal system: Abdomen is nondistended, soft and nontender. No organomegaly or masses felt. Normal bowel sounds heard. Central nervous system: Alert and oriented. No focal neurological deficits. Extremities: Symmetric 5 x 5 power. Skin: No  rashes, lesions or ulcers Psychiatry: Judgement and insight appear normal. Mood & affect appropriate.

## 2023-07-23 NOTE — Progress Notes (Signed)
 Patient began grabbing chest complaining of chest pain, and was visibly SHoB. Jolaine MD notified. Patient no longer complains of chest pain. See mar for order changes.

## 2023-07-23 NOTE — Plan of Care (Signed)
  Problem: Education: Goal: Knowledge of General Education information will improve Description: Including pain rating scale, medication(s)/side effects and non-pharmacologic comfort measures Outcome: Progressing   Problem: Education: Goal: Knowledge of General Education information will improve Description: Including pain rating scale, medication(s)/side effects and non-pharmacologic comfort measures Outcome: Progressing   Problem: Clinical Measurements: Goal: Ability to maintain clinical measurements within normal limits will improve Outcome: Progressing Goal: Will remain free from infection Outcome: Progressing Goal: Diagnostic test results will improve Outcome: Progressing Goal: Respiratory complications will improve Outcome: Progressing Goal: Cardiovascular complication will be avoided Outcome: Progressing   Problem: Activity: Goal: Risk for activity intolerance will decrease Outcome: Progressing   Problem: Nutrition: Goal: Adequate nutrition will be maintained Outcome: Progressing

## 2023-07-23 NOTE — Consult Note (Signed)
 Cardiology Consultation   [name] MRN: [id number]; DOB: 27-Apr-1945  Admit date: 07/22/2023 Date of Consult: 07/23/2023  PCP:  Rhonda Santos, MD   [facility] Providers Cardiologist:  Rhonda MARLA Red, MD  Electrophysiologist:  Rhonda Gladis Norton, MD       Patient Profile: Rhonda Clayton is a 78 y.o. female with a hx of low-flow low gradient aortic stenosis, diabetes, hyperlipidemia, hypertension, end-stage renal disease, trifascicular block who is being seen 07/23/2023 for the evaluation of syncope at the request of Rhonda Clayton.  History of Present Illness: Rhonda Clayton has a history of aortic stenosis and has plans for possible upcoming TAVR.  She has had multiple syncopal episodes over the last few days.  While in the hospital, she has had multiple sinus arrest episodes, up to 8 seconds in length.  She has been having dizzy, lightheaded episodes while in the hospital that correlates to her pauses.  She otherwise feels well.  She has been tolerating dialysis without issue.   Past Medical History:  Diagnosis Date   Anemia    CKD (chronic kidney disease) stage 5, GFR less than 15 ml/min (HCC)    Cystocele    DJD (degenerative joint disease)    DM (diabetes mellitus) (HCC)    Esophageal stricture    Essential hypertension    Hyperlipidemia    Hypothyroidism    Lichen sclerosus    Osteopenia    RBBB with left anterior fascicular block    Rectocele    Stress incontinence    Torn rotator cuff    Bilateral   Type 2 diabetes mellitus (HCC)    Venous insufficiency    Vitreous hemorrhage, left (HCC) 05/25/2019   The nature of the vitreous hemorrhage was discussed with the patient as well as the common causes.   Patients with diabetic eye disease may develop retinal neovascularization.  Other eye conditions develop retinal  neovascularization secondary to retinal venous occlusions.   Vitreous hemorrhage may result from spontaneous vitreous detachment or  retinal breaks.  Blunt trauma is a common cause as wl    Past Surgical History:  Procedure Laterality Date   ABDOMINAL AORTOGRAM N/A 07/14/2023   Procedure: ABDOMINAL AORTOGRAM;  Surgeon: Clayton Rhonda MARLA, MD;  Location: [facility];  Service: Cardiovascular;  Laterality: N/A;   ABDOMINAL EXPOSURE N/A 05/09/2014   Procedure: ABDOMINAL EXPOSURE;  Surgeon: Krystal JULIANNA Doing, MD;  Location: [facility]UR[facility] OR[facility];  Service: Vascular;  Laterality: N/A;   ANTERIOR LAT LUMBAR FUSION Right 05/09/2014   Procedure: Lumbar three-four, Lumbar four-five Anterior lateral lumbar fusion;  Surgeon: Fairy Levels, MD;  Location: [facility];  Service: Neurosurgery;  Laterality: Right;   ANTERIOR LUMBAR FUSION N/A 05/09/2014   Procedure: Stage 1:Lumbar four-five, Lumbar five-Sacral one Anterior lumbar interbody fusion with Dr. Doing for approach ;  Surgeon: Fairy Levels, MD;  Location: [facility];  Service: Neurosurgery;  Laterality: N/A;   CHOLECYSTECTOMY  1987   INCONTINENCE SURGERY     INSERTION OF DIALYSIS CATHETER Right 06/17/2023   Procedure: INSERTION OF DIALYSIS CATHETER;  Surgeon: Pearline Norman RAMAN, MD;  Location: [facility] OR;  Service: Vascular;  Laterality: Right;   LEFT HEART CATH AND CORONARY ANGIOGRAPHY N/A 07/14/2023   Procedure: LEFT HEART CATH AND CORONARY ANGIOGRAPHY;  Surgeon: Clayton Rhonda MARLA, MD;  Location: [facility];  Service: Cardiovascular;  Laterality: N/A;   LUMBAR PERCUTANEOUS PEDICLE SCREW 3 LEVEL N/A 05/10/2014   Procedure: Stage 2: Percutaneous Pedicle  Screws; Laminectomy L3-4  ;  Surgeon: Fairy Levels, MD;  Location: [facility];  Service: Neurosurgery;  Laterality: N/A;  Stage 2: Percutaneous Pedicle Screws T10 to Ileum; Laminectomy L3-4     RECTAL SURGERY     VAGINA SURGERY  2018   Almetta Planas Touch- Vaginal Dryness     Home Medications:  Prior to Admission medications   Medication Sig Start Date End Date Taking? Authorizing Provider  aspirin  EC 81 MG tablet Take 1 tablet (81 mg  total) by mouth daily. Swallow whole. 06/16/23  Yes Henry Shaver B, NP  betamethasone valerate (VALISONE) 0.1 % cream Apply 1 Application topically as needed (skin condition). 02/23/23  Yes [provider]  Coenzyme Q10 (COQ-10 PO) Take 1 tablet by mouth at bedtime.   Yes [provider]  D-MANNOSE PO Take 1 tablet by mouth at bedtime.   Yes [provider]  ezetimibe  (ZETIA ) 10 MG tablet Take 10 mg by mouth daily.   Yes [provider]  Folic Acid -Vit B6-Vit B12 (FOLBEE) 2.5-25-1 MG TABS tablet Take 1 tablet by mouth at bedtime.   Yes [provider]  furosemide  (LASIX ) 40 MG tablet Take 40 mg by mouth See admin instructions. Pt takes 40 mg on Wed,Sat,Sundays 07/01/23  Yes [provider]  levothyroxine  (SYNTHROID ) 100 MCG tablet Take 100 mcg by mouth See admin instructions. Takes 100 mcg daily except for Monday and Friday she takes 50 mcg   Yes [provider]  nitroGLYCERIN  (NITROSTAT ) 0.4 MG SL tablet Place 1 tablet (0.4 mg total) under the tongue every 5 (five) minutes as needed for chest pain. 06/01/23 08/30/23 Yes Nakeeta Sebastiani, Rhonda Lunger, MD  NOVOLOG  MIX 70/30 FLEXPEN (70-30) 100 UNIT/ML FlexPen Inject into the skin in the morning and at bedtime. Takes 14-15 units in the morning and 9-10 units at before dinner According to how many carbohydrates she eats   Yes [provider]  pantoprazole  (PROTONIX ) 40 MG tablet Take 1.5 tablets by mouth daily as needed (Heartburn). 06/03/23  Yes [provider]  B-D UF III MINI PEN NEEDLES 31G X 5 MM MISC Inject into the skin 2 (two) times daily. 03/01/23   [provider]  Continuous Glucose Sensor (FREESTYLE LIBRE 3 SENSOR) MISC change every 14 days topically to monitor blood glucose continuously 28 days 03/01/23   [provider]  Lancets [facility] DELICA PLUS [place]) MISC Apply topically 3 (three) times daily. 02/22/23   [provider]  [facility] VERIO  test strip 3 (three) times daily. 03/03/23   [provider]    Scheduled Meds:  Chlorhexidine  Gluconate Cloth  6 each Topical Q0600   ezetimibe   10 mg Oral QHS   heparin   2,000 Units Dialysis Once in dialysis   heparin   5,000 Units Subcutaneous Q8H   insulin  aspart  0-6 Units Subcutaneous TID WC   [START ON 07/24/2023] levothyroxine   100 mcg Oral Once per day on Sunday Tuesday Wednesday Thursday Saturday   And   levothyroxine   50 mcg Oral Once per day on Monday Friday   Continuous Infusions:  anticoagulant sodium citrate      PRN Meds: acetaminophen  **OR** acetaminophen , alteplase , anticoagulant sodium citrate , feeding supplement (NEPRO CARB STEADY), heparin , heparin , heparin , hydrALAZINE , ipratropium-albuterol , lidocaine  (PF), lidocaine -prilocaine , metoprolol  tartrate, pentafluoroprop-tetrafluoroeth  Allergies:    Allergies  Allergen Reactions   Coffee Flavoring Agent (Non-Screening) Other (See Comments)    Sick on stomach, sneezing, backed up   Azithromycin  Other (See Comments)   Fluogen [Influenza Virus  Vaccine]    Molds & Smuts     Allergy test showed    Pneumococcal Vac Polyvalent Other (See Comments)    Large site reaction    Tobacco Nausea And Vomiting   Versed  [Midazolam ] Other (See Comments)    No memory altering medications. Patient request.   Cephalexin  Other (See Comments)    Yeast infection   Ciprofloxacin Other (See Comments)    tendinitis   Oatmeal Other (See Comments)    Sneezing and drainage   Sulfa Antibiotics Other (See Comments)    Yeast infection    Social History:   Social History   Socioeconomic History   Marital status: Widowed    Spouse name: Not on file   Number of children: 2   Years of education: Not on file   Highest education level: Not on file  Occupational History   Occupation: Retired  Tobacco Use   Smoking status: Never   Smokeless tobacco: Never  Vaping Use   Vaping status: Never Used  Substance and Sexual Activity    Alcohol  use: No   Drug use: Yes    Types: Hydromorphone    Sexual activity: Not on file  Other Topics Concern   Not on file  Social History Narrative   Not on file   Social Drivers of Health   Financial Resource Strain: Not on file  Food Insecurity: No Food Insecurity (07/22/2023)   Hunger Vital Sign    Worried About Running Out of Food in the Last Year: Never true    Ran Out of Food in the Last Year: Never true  Transportation Needs: No Transportation Needs (07/22/2023)   PRAPARE - Administrator, Civil Service (Medical): No    Lack of Transportation (Non-Medical): No  Physical Activity: Not on file  Stress: Not on file  Social Connections: Unknown (07/22/2023)   Social Connection and Isolation Panel    Frequency of Communication with Friends and Family: More than three times a week    Frequency of Social Gatherings with Friends and Family: More than three times a week    Attends Religious Services: Not on Insurance claims handler of Clubs or Organizations: Yes    Attends Banker Meetings: Never    Marital Status: Patient declined  Intimate Partner Violence: Unknown (07/22/2023)   Humiliation, Afraid, Rape, and Kick questionnaire    Fear of Current or Ex-Partner: No    Emotionally Abused: No    Physically Abused: Not on file    Sexually Abused: No    Family History:    Family History  Problem Relation Age of Onset   Multiple myeloma Mother    Cancer Maternal Aunt        Cancer in the mouth - dipped snuff   Heart disease Brother    Diabetes Father    Colon polyps Brother    Heart disease Maternal Uncle    Heart disease Paternal Uncle    Heart disease Paternal Grandfather      ROS:  Please see the history of present illness.   All other ROS reviewed and negative.     Physical Exam/Data: Vitals:   07/22/23 2100 07/22/23 2330 07/23/23 0406 07/23/23 0802  BP: (!) 120/50 (!) 144/76 (!) 128/57 113/64  Pulse: (!) 54 (!) 51  (!) 44  Resp:  20 16 16    Temp:  98 F (36.7 C) 98.3 F (36.8 C) 97.8 F (36.6 C)  TempSrc:  Oral Oral Oral  SpO2: 98% 100% 100%     Intake/Output Summary (Last 24 hours) at 07/23/2023 0844 Last data filed at 07/22/2023 2354 Gross per 24 hour  Intake 240 ml  Output 500 ml  Net -260 ml      07/14/2023   10:07 AM 07/12/2023    1:11 PM 07/05/2023   10:18 AM  Last 3 Weights  Weight (lbs) 173 lb 173 lb 179 lb 9.6 oz  Weight (kg) 78.472 kg 78.472 kg 81.466 kg     There is no height or weight on file to calculate BMI.  General:  Well nourished, well developed, in no acute distress HEENT: normal Neck: no JVD Vascular: No carotid bruits; Distal pulses 2+ bilaterally Cardiac:  normal S1, S2; RRR; 2/6 systolic murmur  Lungs:  clear to auscultation bilaterally, no wheezing, rhonchi or rales  Abd: soft, nontender, no hepatomegaly  Ext: no edema Musculoskeletal:  No deformities, BUE and BLE strength normal and equal Skin: warm and dry  Neuro:  CNs 2-12 intact, no focal abnormalities noted Psych:  Normal affect   EKG:  The EKG was personally reviewed and demonstrates: Sinus rhythm with intermittent sinus arrest Telemetry:  Telemetry was personally reviewed and demonstrates: Sinus rhythm with intermittent sinus arrest  Laboratory Data: High Sensitivity Troponin:   Recent Labs  Lab 07/22/23 1352 07/22/23 1600  TROPONINIHS 41* 40*     Chemistry Recent Labs  Lab 07/22/23 1352 07/22/23 2305 07/22/23 2307  NA 139 141 139  K 3.1* 3.5 3.8  CL 102 105 104  CO2 22 25 24   GLUCOSE 168* 217* 215*  BUN 29* 29* 29*  CREATININE 3.75* 4.03* 3.85*  CALCIUM  8.8* 8.9 8.9  GFRNONAA 12* 11* 11*  ANIONGAP 15 11 11     Recent Labs  Lab 07/22/23 2305  ALBUMIN  3.2*   Lipids No results for input(s): CHOL, TRIG, HDL, LABVLDL, LDLCALC, CHOLHDL in the last 168 hours.  Hematology Recent Labs  Lab 07/22/23 1352 07/22/23 2305  WBC 5.7 6.6  RBC 3.73* 3.60*  HGB 10.4* 10.1*  HCT 34.7* 32.3*  MCV 93.0 89.7   MCH 27.9 28.1  MCHC 30.0 31.3  RDW 17.0* 16.8*  PLT 255 268   Thyroid   Recent Labs  Lab 07/22/23 2307  TSH 1.223    BNP Recent Labs  Lab 07/22/23 1456  BNP 1,346.7*    DDimer No results for input(s): DDIMER in the last 168 hours.  Radiology/Studies:  DG Chest 2 View Result Date: 07/22/2023 CLINICAL DATA:  syncope EXAM: CHEST - 2 VIEW COMPARISON:  None available. FINDINGS: Right IJ approach hemodialysis catheter, terminating at the cavoatrial junction. Low lung volumes. No focal airspace consolidation, pleural effusion, or pneumothorax. No cardiomegaly. Tortuous aorta with aortic atherosclerosis. No acute fracture or destructive lesions. Multilevel thoracic osteophytosis. Thoracolumbar fusion hardware. IMPRESSION: 1. Low lung volumes.  No acute cardiopulmonary abnormality. 2. Well-positioned right IJ approach hemodialysis catheter. Electronically Signed   By: Rogelia Myers M.D.   On: 07/22/2023 14:28     Assessment and Plan: Sinus arrest: Patient is having prolonged pauses, up to 8 seconds in length.  She Glora Hulgan need pacemaker implant.  She is on no rate controlling medications.  Due to the prolonged nature of the pauses, we Shasta Chinn plan for temporary wire implantation.  She Navika Hoopes likely need leadless pacemaker implant due to end-stage renal disease ESRD: Dialysis per nephrology Aorta stenosis: Potential plan for TAVR.  Chiyo Fay need to discuss with structural team next week. Multi vessel coronary disease: No current chest  pain.  Plan for medical management.   Risk Assessment/Risk Scores:   For questions or updates, please contact [facility] Please consult [email or web address] for contact info under    Signed, Achol Azpeitia Gladis Norton, MD  07/23/2023 8:44 AM

## 2023-07-23 NOTE — Progress Notes (Signed)
 Patient had a 5.48 pause. Patient is asymptomatic. Osude MD notified. Zoll Pads on patient. No new orders at this time.

## 2023-07-23 NOTE — Progress Notes (Incomplete)
 Patient had a 6 sec pause.

## 2023-07-23 NOTE — Progress Notes (Signed)
 PROGRESS NOTE    Rhonda Clayton  FMW:[id number] DOB: 1945-04-16 DOA: 07/22/2023 PCP: [name], MD    Brief Narrative:   78 year old with history of aortic stenosis, DM 2, HTN, HLD, ESRD on HD admitted to the hospital for evaluation of syncope.  Noted to have 3-4.5-second sinus pauses.  Cardiology team was consulted.  Assessment & Plan:  Principal Problem:   Symptomatic sinus bradycardia Active Problems:   DM (diabetes mellitus) (HCC)   HTN (hypertension)   Prolonged QT interval   Hyperlipidemia   ESRD on dialysis (HCC)    Symptomatic sinus bradycardia, sinus pause Aortic stenosis -Patient admitted to the hospital, cardiology/EP team following.  Likely will require pacemaker, will defer decision to their service, for now temporary wire implantation planned - TSH normal  Echocardiogram 05/2023-EF 40%, grade 2 DD, severe hypokinesia of the left wall, dilated LA LHC 07/14/2023-multivessel CAD   Aortic atherosclerosis - Will hold aspirin , Plavix pending potential procedures   ESRD on HD - Nephrology consulted   Diabetes mellitus - Recent A1c 6.5 - Sliding scale insulin    Hyperlipidemia - Zetia    Hypertension - Off antihypertensives PTA due to low blood pressure   Hypothyroidism - Synthroid        DVT prophylaxis: heparin  injection 5,000 Units Start: 07/22/23 2200    Code Status: Do not attempt resuscitation (DNR) PRE-ARREST INTERVENTIONS DESIRED Family Communication: Family at bedside Status is: Inpatient Ongoing evaluation by cardiology    Subjective: Seen at bedside no complaints.   Examination:  General exam: Appears calm and comfortable  Respiratory system: Clear to auscultation. Respiratory effort normal. Cardiovascular system: S1 & S2 heard, RRR. No JVD, murmurs, rubs, gallops or clicks. No pedal edema. Gastrointestinal system: Abdomen is nondistended, soft and nontender. No organomegaly or masses felt. Normal bowel sounds heard. Central  nervous system: Alert and oriented. No focal neurological deficits. Extremities: Symmetric 5 x 5 power. Skin: No rashes, lesions or ulcers Psychiatry: Judgement and insight appear normal. Mood & affect appropriate.                Diet Orders (From admission, onward)     Start     Ordered   07/23/23 0936  Diet regular Room service appropriate? Yes; Fluid consistency: Thin; Fluid restriction: 1500 mL Fluid  Diet effective now       Question Answer Comment  Room service appropriate? Yes   Fluid consistency: Thin   Fluid restriction: 1500 mL Fluid      07/23/23 0935            Objective: Vitals:   07/22/23 2100 07/22/23 2330 07/23/23 0406 07/23/23 0802  BP: (!) 120/50 (!) 144/76 (!) 128/57 113/64  Pulse: (!) 54 (!) 51  (!) 44  Resp:  20 16 16   Temp:  98 F (36.7 C) 98.3 F (36.8 C) 97.8 F (36.6 C)  TempSrc:  Oral Oral Oral  SpO2: 98% 100% 100% 98%    Intake/Output Summary (Last 24 hours) at 07/23/2023 1118 Last data filed at 07/23/2023 1042 Gross per 24 hour  Intake 240 ml  Output 900 ml  Net -660 ml   There were no vitals filed for this visit.  Scheduled Meds:  Chlorhexidine  Gluconate Cloth  6 each Topical Q0600   darbepoetin (ARANESP ) injection - DIALYSIS  40 mcg Subcutaneous Q Fri-1800   ezetimibe   10 mg Oral QHS   [START ON 07/24/2023] furosemide   40 mg Oral Once per day on Sunday Tuesday Thursday Saturday   heparin   2,000  Units Dialysis Once in dialysis   heparin   5,000 Units Subcutaneous Q8H   insulin  aspart  0-6 Units Subcutaneous TID WC   [START ON 07/24/2023] levothyroxine   100 mcg Oral Once per day on Sunday Tuesday Wednesday Thursday Saturday   And   levothyroxine   50 mcg Oral Once per day on Monday Friday   Continuous Infusions:  anticoagulant sodium citrate       Nutritional status     There is no height or weight on file to calculate BMI.  Data Reviewed:   CBC: Recent Labs  Lab 07/22/23 1352 07/22/23 2305 07/23/23 0910  WBC 5.7  6.6 5.5  NEUTROABS 2.9  --   --   HGB 10.4* 10.1* 9.8*  HCT 34.7* 32.3* 31.0*  MCV 93.0 89.7 89.9  PLT 255 268 262   Basic Metabolic Panel: Recent Labs  Lab 07/22/23 1352 07/22/23 2305 07/22/23 2307  NA 139 141 139  K 3.1* 3.5 3.8  CL 102 105 104  CO2 22 25 24   GLUCOSE 168* 217* 215*  BUN 29* 29* 29*  CREATININE 3.75* 4.03* 3.85*  CALCIUM  8.8* 8.9 8.9  PHOS  --  4.1  --    GFR: Estimated Creatinine Clearance: 11.1 mL/min (A) (by C-G formula based on SCr of 3.85 mg/dL (H)). Liver Function Tests: Recent Labs  Lab 07/22/23 2305  ALBUMIN  3.2*   No results for input(s): LIPASE, AMYLASE in the last 168 hours. No results for input(s): AMMONIA in the last 168 hours. Coagulation Profile: No results for input(s): INR, PROTIME in the last 168 hours. Cardiac Enzymes: No results for input(s): CKTOTAL, CKMB, CKMBINDEX, TROPONINI in the last 168 hours. BNP (last 3 results) No results for input(s): PROBNP in the last 8760 hours. HbA1C: No results for input(s): HGBA1C in the last 72 hours. CBG: Recent Labs  Lab 07/22/23 1020 07/22/23 1856 07/22/23 2132 07/23/23 0951  GLUCAP 175* 150* 184* 196*   Lipid Profile: No results for input(s): CHOL, HDL, LDLCALC, TRIG, CHOLHDL, LDLDIRECT in the last 72 hours. Thyroid  Function Tests: Recent Labs    07/22/23 2307  TSH 1.223   Anemia Panel: No results for input(s): VITAMINB12, FOLATE, FERRITIN, TIBC, IRON , RETICCTPCT in the last 72 hours. Sepsis Labs: No results for input(s): PROCALCITON, LATICACIDVEN in the last 168 hours.  No results found for this or any previous visit (from the past 240 hours).       Radiology Studies: DG Chest 2 View Result Date: 07/22/2023 CLINICAL DATA:  syncope EXAM: CHEST - 2 VIEW COMPARISON:  None available. FINDINGS: Right IJ approach hemodialysis catheter, terminating at the cavoatrial junction. Low lung volumes. No focal airspace consolidation,  pleural effusion, or pneumothorax. No cardiomegaly. Tortuous aorta with aortic atherosclerosis. No acute fracture or destructive lesions. Multilevel thoracic osteophytosis. Thoracolumbar fusion hardware. IMPRESSION: 1. Low lung volumes.  No acute cardiopulmonary abnormality. 2. Well-positioned right IJ approach hemodialysis catheter. Electronically Signed   By: Rogelia Myers M.D.   On: 07/22/2023 14:28           LOS: 1 day   Time spent= 35 mins    Burgess JAYSON Dare, MD [facility]  If 7PM-7AM, please contact night-coverage  07/23/2023, 11:18 AM

## 2023-07-24 ENCOUNTER — Encounter (HOSPITAL_COMMUNITY): Payer: Self-pay | Admitting: Cardiovascular Disease

## 2023-07-24 DIAGNOSIS — I2510 Atherosclerotic heart disease of native coronary artery without angina pectoris: Secondary | ICD-10-CM | POA: Diagnosis not present

## 2023-07-24 DIAGNOSIS — I350 Nonrheumatic aortic (valve) stenosis: Secondary | ICD-10-CM

## 2023-07-24 DIAGNOSIS — R001 Bradycardia, unspecified: Secondary | ICD-10-CM | POA: Diagnosis not present

## 2023-07-24 LAB — MAGNESIUM: Magnesium: 1.8 mg/dL (ref 1.7–2.4)

## 2023-07-24 LAB — GLUCOSE, CAPILLARY
Glucose-Capillary: 214 mg/dL — ABNORMAL HIGH (ref 70–99)
Glucose-Capillary: 361 mg/dL — ABNORMAL HIGH (ref 70–99)
Glucose-Capillary: 276 mg/dL — ABNORMAL HIGH (ref 70–99)

## 2023-07-24 LAB — BASIC METABOLIC PANEL WITH GFR
Anion gap: 13 (ref 5–15)
BUN: 20 mg/dL (ref 8–23)
CO2: 24 mmol/L (ref 22–32)
Calcium: 9 mg/dL (ref 8.9–10.3)
Chloride: 100 mmol/L (ref 98–111)
Creatinine, Ser: 2.72 mg/dL — ABNORMAL HIGH (ref 0.44–1.00)
GFR, Estimated: 17 mL/min — ABNORMAL LOW (ref >60)
Glucose, Bld: 154 mg/dL — ABNORMAL HIGH (ref 70–99)
Potassium: 3.5 mmol/L (ref 3.5–5.1)
Sodium: 137 mmol/L (ref 135–145)

## 2023-07-24 LAB — PHOSPHORUS: Phosphorus: 2.9 mg/dL (ref 2.5–4.6)

## 2023-07-24 MED ORDER — ATORVASTATIN CALCIUM 40 MG PO TABS
40.0000 mg | ORAL_TABLET | Freq: Every day | ORAL | Status: DC
  Administered 2023-07-24 – 2023-07-31 (×8): 40 mg via ORAL
  Filled 2023-07-24 (×8): qty 1

## 2023-07-24 MED ORDER — SENNOSIDES-DOCUSATE SODIUM 8.6-50 MG PO TABS
1.0000 | ORAL_TABLET | Freq: Every day | ORAL | Status: DC
  Administered 2023-07-24 – 2023-07-26 (×3): 1 via ORAL
  Filled 2023-07-24 (×6): qty 1

## 2023-07-24 MED ORDER — POLYETHYLENE GLYCOL 3350 17 G PO PACK
17.0000 g | PACK | Freq: Every day | ORAL | Status: DC
  Administered 2023-07-24: 17 g via ORAL
  Filled 2023-07-24 (×3): qty 1

## 2023-07-24 MED ORDER — ASPIRIN 81 MG PO TBEC
81.0000 mg | DELAYED_RELEASE_TABLET | Freq: Every day | ORAL | Status: DC
  Administered 2023-07-24 – 2023-07-31 (×8): 81 mg via ORAL
  Filled 2023-07-24 (×8): qty 1

## 2023-07-24 MED ORDER — SODIUM CHLORIDE 0.9 % IV SOLN
250.0000 mL | INTRAVENOUS | Status: AC | PRN
  Administered 2023-07-25: 250 mL via INTRAVENOUS

## 2023-07-24 NOTE — Progress Notes (Signed)
 Post temporary pacemaker wire, patient transferred 2H ICU for Communicated with cardiology, their services primary  [facility] will be available once out of the ICU  Burgess Dare MD [facility]

## 2023-07-24 NOTE — Progress Notes (Signed)
 [name], female   DOB: 03-26-1945, 78 y.o.   MRN: [id number]     Advanced Heart Failure Rounding Note  Cardiologist: Lurena MARLA Red, MD  Chief Complaint: Syncope Subjective:    No chest pain or dyspnea.   TTVP in place, pacing occasionally but generally NSR.    Objective:   Weight Range: 97.7 kg Body mass index is 40.7 kg/m.   Vital Signs:   Temp:  [97.5 F (36.4 C)-97.7 F (36.5 C)] 97.5 F (36.4 C) (07/05 0700) Pulse Rate:  [0-77] 53 (07/05 0900) Resp:  [2-28] 15 (07/05 0900) BP: (78-167)/(46-97) 105/74 (07/05 0900) SpO2:  [92 %-100 %] 98 % (07/05 0900) Weight:  [97.7 kg] 97.7 kg (07/05 0248) Last BM Date : 07/18/23  Weight change: Filed Weights   07/24/23 0248  Weight: 97.7 kg    Intake/Output:   Intake/Output Summary (Last 24 hours) at 07/24/2023 0936 Last data filed at 07/24/2023 0800 Gross per 24 hour  Intake 614.55 ml  Output 1900 ml  Net -1285.45 ml      Physical Exam    General:  Well appearing. No resp difficulty HEENT: Normal Neck: Supple. JVP not elevated. Carotids 2+ bilat; no bruits. No lymphadenopathy or thyromegaly appreciated. Cor: PMI nondisplaced. Regular rate & rhythm. No rubs, gallops or murmurs. Lungs: Clear Abdomen: Soft, nontender, nondistended. No hepatosplenomegaly. No bruits or masses. Good bowel sounds. Extremities: No cyanosis, clubbing, rash, edema Neuro: Alert & orientedx3, cranial nerves grossly intact. moves all 4 extremities w/o difficulty. Affect pleasant   Telemetry   NSR 60s, intermittent v-pacing (personally reviewed)  Labs    CBC Recent Labs    07/22/23 1352 07/22/23 2305 07/23/23 0910  WBC 5.7 6.6 5.5  NEUTROABS 2.9  --   --   HGB 10.4* 10.1* 9.8*  HCT 34.7* 32.3* 31.0*  MCV 93.0 89.7 89.9  PLT 255 268 262   Basic Metabolic Panel Recent Labs    92/96/74 2305 07/22/23 2307 07/24/23 0331  NA 141 139 137  K 3.5 3.8 3.5  CL 105 104 100  CO2 25 24 24   GLUCOSE 217* 215* 154*   BUN 29* 29* 20  CREATININE 4.03* 3.85* 2.72*  CALCIUM  8.9 8.9 9.0  MG  --   --  1.8  PHOS 4.1  --  2.9   Liver Function Tests Recent Labs    07/22/23 2305  ALBUMIN  3.2*   No results for input(s): LIPASE, AMYLASE in the last 72 hours. Cardiac Enzymes No results for input(s): CKTOTAL, CKMB, CKMBINDEX, TROPONINI in the last 72 hours.  BNP: BNP (last 3 results) Recent Labs    01/03/23 0925 06/12/23 1945 07/22/23 1456  BNP 709.1* 1,627.4* 1,346.7*    ProBNP (last 3 results) No results for input(s): PROBNP in the last 8760 hours.   D-Dimer No results for input(s): DDIMER in the last 72 hours. Hemoglobin A1C No results for input(s): HGBA1C in the last 72 hours. Fasting Lipid Panel No results for input(s): CHOL, HDL, LDLCALC, TRIG, CHOLHDL, LDLDIRECT in the last 72 hours. Thyroid  Function Tests Recent Labs    07/22/23 2307  TSH 1.223    Other results:   Imaging    CARDIAC CATHETERIZATION Result Date: 07/23/2023 Sinus pauses Successful placement of transvenous pacing wire from the left internal jugular vein     Medications:     Scheduled Medications:  aspirin  EC  81 mg Oral Daily   atorvastatin   40 mg Oral Daily   Chlorhexidine  Gluconate Cloth  6  each Topical Q0600   darbepoetin (ARANESP ) injection - DIALYSIS  40 mcg Subcutaneous Q Fri-1800   ezetimibe   10 mg Oral QHS   furosemide   40 mg Oral Once per day on Sunday Tuesday Thursday Saturday   heparin   2,000 Units Dialysis Once in dialysis   heparin   5,000 Units Subcutaneous Q8H   insulin  aspart  0-6 Units Subcutaneous TID WC   levothyroxine   100 mcg Oral Once per day on Sunday Tuesday Wednesday Thursday Saturday   And   levothyroxine   50 mcg Oral Once per day on Monday Friday   polyethylene glycol  17 g Oral Daily   senna-docusate  1 tablet Oral QHS   sodium chloride  flush  3 mL Intravenous Q12H    Infusions:  sodium chloride  250 mL (07/24/23 0853)   anticoagulant  sodium citrate       PRN Medications: sodium chloride , acetaminophen  **OR** acetaminophen , alteplase , anticoagulant sodium citrate , feeding supplement (NEPRO CARB STEADY), heparin , heparin , heparin , hydrALAZINE , ipratropium-albuterol , lidocaine  (PF), lidocaine -prilocaine , lip balm, mouth rinse, pentafluoroprop-tetrafluoroeth, sodium chloride  flush    Assessment/Plan   1. Bradycardia: History of trifascicular block.  Admitted with syncopal episodes and found to have long symptomatic sinus pauses.  TTVP placed yesterday.  - Maintain TTVP - She will need permanent pacemaker, given ESRD Dr. Nancey has been planning on leadless device.  Will need to coordinate with TAVR.  2. ESRD: Recently started HD.  Had HD last night.  - Nephrology following, next HD likely on Monday.  3. CAD: Diffuse CAD noted on 6/25 LHC with 80% mid and 99% distal LAD stenosis, TO D1, 80% mLCx with 70% distal LCx.  No intervention given diffuse mid to distal LAD and LCx disease and no ACS.  She denies chest pain.  Plan to continue medical management.  - ASA 81 - Zetia  10 mg daily.  - Would add atorvastatin  40 mg daily.  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  She does not want to take a medication that involves injections either.  4. Aortic stenosis: Low flow/low gradient severe AS. Echo in 5/25 with EF 35-40%, peri-apical severe hypokinesis, AVA 0.89 cm^2 with mean gradient 26 mmHg. She has been undergoing TAVR workup.  - She will need to be seen Monday by structural heart team.  Will need to decide on timing of TAVR and pacemaker placement.  5. Chronic systolic CHF:  Echo in 5/25 with EF 35-40%, peri-apical severe hypokinesis, low flow/low gradient severe AS with AVA 0.89 cm^2 with mean gradient 26 mmHg. 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  She does not look volume  overloaded on exam.  - Volume control via HD.  - No beta blocker with sinus pauses.  - GDMT limited by ESRD.   CRITICAL CARE Performed by: Ezra Shuck  Total critical care time: 40 minutes  Critical care time was exclusive of separately billable procedures and treating other patients.  Critical care was necessary to treat or prevent imminent or life-threatening deterioration.  Critical care was time spent personally by me on the following activities: development of treatment plan with patient and/or surrogate as well as nursing, discussions with consultants, evaluation of patient's response to treatment, examination of patient, obtaining history from patient or surrogate, ordering and performing treatments and interventions, ordering and review of laboratory studies, ordering and review of radiographic studies, pulse oximetry and  re-evaluation of patient's condition.   Length of Stay: 2  Ezra Shuck, MD  07/24/2023, 9:36 AM  Advanced Heart Failure Team Pager [phone] (M-F; 7a - 5p)  Please contact [facility] for night-coverage after hours (5p -7a ) and weekends on [email or web address]

## 2023-07-24 NOTE — Progress Notes (Signed)
 Nephrology Follow-Up Consult note   Assessment/Recommendations: Rhonda Clayton is a/an 78 y.o. female with a past medical history significant for ESRD, admitted for symptomatic bradycardia.       Dialysis Orders: [facility] M,T,Th,S 3 hrs 180NRe 350/Autoflow 1.5 80 kg 2.0 K/ 2.0 Ca TDC - Heparin  2000 units IV three times per week - Mircera 75 mcg IV q 2 weeks (last dose 07/13/2023 next dose due 07/21/2023-did not receive) - Venofer  50 mg IV q week   Assessment/Plan:  Symptomatic Bradycardia: Temporary pacer in now.  Plan for pacemaker per cardiology when able AS-being worked up for TAVR. Per primary/Cards   ESRD -  Will have HD MWF while in hospital.  Continue on current schedule  Hypertension/volume  - BP and volume controlled. Continue Furosemide  on non HD days. UF as tolerated.  Consider higher dose of Lasix   Anemia  -ESA was given on 7/4  Metabolic bone disease -  BMD labs at goal. No binders/ VDRA yet  Nutrition - K+ on low side. Regular diet with fluid restrictions.    Recommendations conveyed to primary service.    Jayson JINNY Player [facility] 07/24/2023 8:21 AM  ___________________________________________________________  CC: Bradycardia  Interval History/Subjective: Patient states she had a rough night.  Difficulty sleeping.  Difficulty using the restroom.  Tolerated dialysis fairly well.   Medications:  Current Facility-Administered Medications  Medication Dose Route Frequency Provider Last Rate Last Admin   0.9 %  sodium chloride  infusion  250 mL Intravenous PRN Verlin Lonni JONETTA, MD 10 mL/hr at 07/24/23 0700 Infusion Verify at 07/24/23 0700   acetaminophen  (TYLENOL ) tablet 650 mg  650 mg Oral Q6H PRN Verlin Lonni JONETTA, MD       Or   acetaminophen  (TYLENOL ) suppository 650 mg  650 mg Rectal Q6H PRN Verlin Lonni JONETTA, MD       alteplase  (CATHFLO ACTIVASE ) injection 2 mg  2 mg Intracatheter Once PRN Verlin Lonni JONETTA, MD        anticoagulant sodium citrate  solution 5 mL  5 mL Intracatheter PRN Verlin Lonni JONETTA, MD       Chlorhexidine  Gluconate Cloth 2 % PADS 6 each  6 each Topical Q0600 Verlin Lonni JONETTA, MD   6 each at 07/24/23 9286   Darbepoetin Alfa  (ARANESP ) injection 40 mcg  40 mcg Subcutaneous Q Fri-1800 McAlhany, Christopher D, MD   40 mcg at 07/23/23 1830   ezetimibe  (ZETIA ) tablet 10 mg  10 mg Oral QHS Verlin Lonni JONETTA, MD   10 mg at 07/23/23 2119   feeding supplement (NEPRO CARB STEADY) liquid 237 mL  237 mL Oral PRN Verlin Lonni JONETTA, MD       furosemide  (LASIX ) tablet 40 mg  40 mg Oral Once per day on Sunday Tuesday Thursday Saturday Verlin Lonni JONETTA, MD       heparin  injection 1,000 Units  1,000 Units Intracatheter PRN Verlin Lonni JONETTA, MD       heparin  injection 1,600 Units  20 Units/kg Dialysis PRN Verlin Lonni JONETTA, MD       heparin  injection 2,000 Units  2,000 Units Dialysis Once in dialysis Verlin Lonni JONETTA, MD       heparin  injection 2,000 Units  2,000 Units Dialysis PRN Verlin Lonni JONETTA, MD       heparin  injection 5,000 Units  5,000 Units Subcutaneous Q8H Verlin Lonni JONETTA, MD   5,000 Units at 07/24/23 9447   hydrALAZINE  (APRESOLINE ) injection 10 mg  10 mg Intravenous Q4H PRN  Verlin Lonni BIRCH, MD       insulin  aspart (novoLOG ) injection 0-6 Units  0-6 Units Subcutaneous TID WC Verlin Lonni BIRCH, MD   2 Units at 07/24/23 0754   ipratropium-albuterol  (DUONEB) 0.5-2.5 (3) MG/3ML nebulizer solution 3 mL  3 mL Nebulization Q4H PRN Verlin Lonni BIRCH, MD       levothyroxine  (SYNTHROID ) tablet 100 mcg  100 mcg Oral Once per day on Sunday Tuesday Wednesday Thursday Saturday Verlin Lonni BIRCH, MD   100 mcg at 07/24/23 9447   And   levothyroxine  (SYNTHROID ) tablet 50 mcg  50 mcg Oral Once per day on Monday Friday Verlin Lonni BIRCH, MD   50 mcg at 07/23/23 9183   lidocaine  (PF) (XYLOCAINE ) 1 % injection 5 mL  5 mL  Intradermal PRN Verlin Lonni BIRCH, MD       lidocaine -prilocaine  (EMLA ) cream 1 Application  1 Application Topical PRN Verlin Lonni BIRCH, MD       lip balm (CARMEX) ointment   Topical PRN Inocencio Soyla Lunger, MD       metoprolol  tartrate (LOPRESSOR ) injection 5 mg  5 mg Intravenous Q4H PRN Verlin Lonni BIRCH, MD       Oral care mouth rinse  15 mL Mouth Rinse PRN Camnitz, Will Lunger, MD       pentafluoroprop-tetrafluoroeth (GEBAUERS) aerosol 1 Application  1 Application Topical PRN Verlin Lonni BIRCH, MD       sodium chloride  flush (NS) 0.9 % injection 3 mL  3 mL Intravenous Q12H Verlin Lonni BIRCH, MD   3 mL at 07/23/23 2120   sodium chloride  flush (NS) 0.9 % injection 3 mL  3 mL Intravenous PRN Verlin Lonni BIRCH, MD          Review of Systems: 10 systems reviewed and negative except per interval history/subjective  Physical Exam: Vitals:   07/24/23 0633 07/24/23 0700  BP: (!) 102/58 (!) 113/53  Pulse: (!) 51 (!) 50  Resp: 10 19  Temp:  (!) 97.5 F (36.4 C)  SpO2: 96% 98%   No intake/output data recorded.  Intake/Output Summary (Last 24 hours) at 07/24/2023 9178 Last data filed at 07/24/2023 0700 Gross per 24 hour  Intake 604.56 ml  Output 2300 ml  Net -1695.44 ml   Constitutional: Tired-appearing, no acute distress ENMT: ears and nose without scars or lesions, MMM CV: Bradycardia, no edema Respiratory: clear to auscultation, normal work of breathing Gastrointestinal: soft, non-tender, no palpable masses or hernias Skin: no visible lesions or rashes Psych: alert, judgement/insight appropriate, appropriate mood and affect   Test Results I personally reviewed new and old clinical labs and radiology tests Lab Results  Component Value Date   NA 137 07/24/2023   K 3.5 07/24/2023   CL 100 07/24/2023   CO2 24 07/24/2023   BUN 20 07/24/2023   CREATININE 2.72 (H) 07/24/2023   CALCIUM  9.0 07/24/2023   ALBUMIN  3.2 (L) 07/22/2023   PHOS 2.9  07/24/2023    CBC Recent Labs  Lab 07/22/23 1352 07/22/23 2305 07/23/23 0910  WBC 5.7 6.6 5.5  NEUTROABS 2.9  --   --   HGB 10.4* 10.1* 9.8*  HCT 34.7* 32.3* 31.0*  MCV 93.0 89.7 89.9  PLT 255 268 262

## 2023-07-24 NOTE — Progress Notes (Incomplete)
 Received patient in bed to unit.  Alert and oriented.  Informed consent signed and in chart.   TX duration: 2 hours67min  Patient tolerated well.   Alert, without acute distress.  Hand-off given to patient's nurse.   Access used: catheter Access issues: not functioning well  Total UF removed: 1500 ml Medication(s) given: none Post HD VS: 101/63 Post HD weight: unable to obtain     07/24/23 0248  Vitals  Temp (!) 97.5 F (36.4 C)  Temp Source Oral  BP 101/63  MAP (mmHg) 77  BP Location Right Arm  BP Method Automatic  Patient Position (if appropriate) Lying  Pulse Rate (!) 52  Pulse Rate Source Monitor  ECG Heart Rate (!) 53  Resp 18  Weight 97.7 kg  Type of Weight Post-Dialysis  Oxygen Therapy  SpO2 98 %  O2 Device Room Air  Patient Activity (if Appropriate) In bed  Pulse Oximetry Type Continuous  During Treatment Monitoring  Blood Flow Rate (mL/min) 0 mL/min  Arterial Pressure (mmHg) -0.2 mmHg  Venous Pressure (mmHg) -0.8 mmHg  TMP (mmHg) -51.91 mmHg  Ultrafiltration Rate (mL/min) 0 mL/min  Dialysate Flow Rate (mL/min) 0 ml/min  Dialysate Potassium Concentration 3  Dialysate Calcium  Concentration 2.5  Duration of HD Treatment -hour(s) 2.45 hour(s)  Cumulative Fluid Removed (mL) per Treatment  1524.27  Post Treatment  Dialyzer Clearance Lightly streaked  Hemodialysis Intake (mL) 200 mL  Liters Processed 48.2  Fluid Removed (mL) 1500 mL  Tolerated HD Treatment No (Comment)  Post-Hemodialysis Comments HD tx not achieved as prescribed due to catheter not functioning well, pt c/o headache, cramping and hypotensive  AVG/AVF Arterial Site Held (minutes) 0 minutes  AVG/AVF Venous Site Held (minutes) 0 minutes  Note  Patient Observations pt is in bed resting  Hemodialysis Catheter Right Internal jugular Double lumen Permanent (Tunneled)  Placement Date/Time: 07/22/23 1155   Placed prior to admission: No  Serial / Lot #: [id number]  Expiration Date: 08/31/27   Time Out: Correct patient;Correct site;Correct procedure  Maximum sterile barrier precautions: Hand hygiene;Large sterile sheet;...  Site Condition No complications  Blue Lumen Status Flushed;Heparin  locked;Antimicrobial dead end cap  Red Lumen Status Flushed;Heparin  locked;Dead end cap in place  Purple Lumen Status N/A  Catheter fill solution Heparin  1000 units/ml  Catheter fill volume (Arterial) 1.6 cc  Catheter fill volume (Venous) 1.6  Dressing Type Gauze/Drain sponge  Dressing Status New drainage  Drainage Description None  Post treatment catheter status Capped and Clamped    [facility]

## 2023-07-24 NOTE — Progress Notes (Signed)
  Progress Note  Patient Name: Rhonda Clayton Date of Encounter: 07/24/2023 [facility] Cardiologist: Arun K Thukkani, MD   Interval Summary   Temp wire placed yesterday. Transferred to CICU. No acute overnight events. Doing relatively well this morning.   Vital Signs Vitals:   07/24/23 0800 07/24/23 0900 07/24/23 1000 07/24/23 1100  BP: 105/80 105/74 134/68   Pulse: (!) 56 (!) 53 (!) 57   Resp: 18 15 14    Temp:    (!) 97.5 F (36.4 C)  TempSrc:    Axillary  SpO2: 96% 98% 98%   Weight:        Intake/Output Summary (Last 24 hours) at 07/24/2023 1121 Last data filed at 07/24/2023 1000 Gross per 24 hour  Intake 754.46 ml  Output 1900 ml  Net -1145.54 ml      07/24/2023    2:48 AM 07/14/2023   10:07 AM 07/12/2023    1:11 PM  Last 3 Weights  Weight (lbs) 215 lb 6.2 oz 173 lb 173 lb  Weight (kg) 97.7 kg 78.472 kg 78.472 kg      Telemetry/ECG  SR, intermittent V pacing - Personally Reviewed  Physical Exam  General: Well developed, in no acute distress.  Neck: No JVD. LIJ temp wire. Chest: Right tunneled dialysis line. Cardiac: Bradycardic, regular rhythm.  Resp: Normal work of breathing.  Ext: No edema.  Neuro: No gross focal deficits.  Psych: Normal affect.   Assessment & Plan  #. Sinus arrest: Patient is having prolonged pauses, up to 8 seconds in length. Temp wire has been placed. She will ultimately need permanent pacemaker implant, preferably leadless due to HD. Leadless would necessitate general anesthesia, potentially hours if dual chamber leadless is done. Timing of this to be influenced by whether or not there are plans for her severe aortic stenosis and unrevascularized multivessel CAD.  - Continue temp wire. Threshold <1. Intermittently pacing. Decreased backup rate to 40bpm as to not compete with her intrinsic bradycardia.  - Will discuss with structural team regarding plans for her severe aortic stenosis and unrevascularized multivessel CAD. Addressing  these prior to leadless implant would be ideal to minimize risk associated with general anesthesia for prolonged dual chamber leadless implant.   #. ESRD - Dialysis per nephrology  #. LFLG severe aortic stenosis:  #. Multivessel CAD: -Potential plan for TAVR.  Will need to discuss with structural team next week.   For questions or updates, please contact [facility] Please consult [email or web address] for contact info under       Signed, Fonda Kitty, MD

## 2023-07-25 DIAGNOSIS — I350 Nonrheumatic aortic (valve) stenosis: Secondary | ICD-10-CM | POA: Diagnosis not present

## 2023-07-25 DIAGNOSIS — R001 Bradycardia, unspecified: Secondary | ICD-10-CM | POA: Diagnosis not present

## 2023-07-25 DIAGNOSIS — I2510 Atherosclerotic heart disease of native coronary artery without angina pectoris: Secondary | ICD-10-CM | POA: Diagnosis not present

## 2023-07-25 LAB — BASIC METABOLIC PANEL WITH GFR
Anion gap: 12 (ref 5–15)
BUN: 33 mg/dL — ABNORMAL HIGH (ref 8–23)
CO2: 22 mmol/L (ref 22–32)
Calcium: 8.8 mg/dL — ABNORMAL LOW (ref 8.9–10.3)
Chloride: 100 mmol/L (ref 98–111)
Creatinine, Ser: 3.97 mg/dL — ABNORMAL HIGH (ref 0.44–1.00)
GFR, Estimated: 11 mL/min — ABNORMAL LOW (ref >60)
Glucose, Bld: 282 mg/dL — ABNORMAL HIGH (ref 70–99)
Potassium: 3.7 mmol/L (ref 3.5–5.1)
Sodium: 134 mmol/L — ABNORMAL LOW (ref 135–145)

## 2023-07-25 LAB — GLUCOSE, CAPILLARY
Glucose-Capillary: 257 mg/dL — ABNORMAL HIGH (ref 70–99)
Glucose-Capillary: 396 mg/dL — ABNORMAL HIGH (ref 70–99)
Glucose-Capillary: 354 mg/dL — ABNORMAL HIGH (ref 70–99)
Glucose-Capillary: 258 mg/dL — ABNORMAL HIGH (ref 70–99)

## 2023-07-25 LAB — MAGNESIUM: Magnesium: 1.9 mg/dL (ref 1.7–2.4)

## 2023-07-25 MED ORDER — INSULIN ASPART 100 UNIT/ML IJ SOLN: 0.0000 [IU] | Freq: Every day | INTRAMUSCULAR | Status: DC

## 2023-07-25 MED ORDER — INSULIN ASPART 100 UNIT/ML IJ SOLN
0.0000 [IU] | Freq: Three times a day (TID) | INTRAMUSCULAR | Status: DC
  Administered 2023-07-25: 15 [IU] via SUBCUTANEOUS
  Administered 2023-07-26: 2 [IU] via SUBCUTANEOUS
  Administered 2023-07-26 (×2): 8 [IU] via SUBCUTANEOUS
  Administered 2023-07-27: 3 [IU] via SUBCUTANEOUS
  Administered 2023-07-27: 5 [IU] via SUBCUTANEOUS
  Administered 2023-07-28: 8 [IU] via SUBCUTANEOUS
  Administered 2023-07-28: 2 [IU] via SUBCUTANEOUS
  Administered 2023-07-28: 3 [IU] via SUBCUTANEOUS
  Administered 2023-07-29: 5 [IU] via SUBCUTANEOUS
  Administered 2023-07-29: 3 [IU] via SUBCUTANEOUS
  Administered 2023-07-30: 11 [IU] via SUBCUTANEOUS
  Administered 2023-07-30 – 2023-07-31 (×2): 5 [IU] via SUBCUTANEOUS

## 2023-07-25 MED ORDER — INSULIN ASPART 100 UNIT/ML IJ SOLN
0.0000 [IU] | Freq: Every day | INTRAMUSCULAR | Status: DC
  Administered 2023-07-25: 3 [IU] via SUBCUTANEOUS
  Administered 2023-07-26: 2 [IU] via SUBCUTANEOUS
  Administered 2023-07-27: 3 [IU] via SUBCUTANEOUS

## 2023-07-25 MED ORDER — CHLORHEXIDINE GLUCONATE CLOTH 2 % EX PADS
6.0000 | MEDICATED_PAD | Freq: Every day | CUTANEOUS | Status: DC
  Administered 2023-07-25 – 2023-07-27 (×3): 6 via TOPICAL

## 2023-07-25 MED ORDER — INSULIN ASPART 100 UNIT/ML IJ SOLN
0.0000 [IU] | Freq: Three times a day (TID) | INTRAMUSCULAR | Status: DC
  Administered 2023-07-25: 9 [IU] via SUBCUTANEOUS

## 2023-07-25 NOTE — Progress Notes (Signed)
  Progress Note  Patient Name: Rhonda Clayton Date of Encounter: 07/25/2023 [facility] Cardiologist: Arun K Thukkani, MD   Interval Summary   No acute overnight events. Patient reports feeling relatively well. No new or acute complaints.   Intermittent pacing from temp wire. Capture threshold <1.  Vital Signs Vitals:   07/25/23 0727 07/25/23 0800 07/25/23 0900 07/25/23 1000  BP:  (!) 117/56 (!) 120/43 (!) 117/51  Pulse:  (!) 51 65 (!) 49  Resp:  (!) 21 16 18   Temp: 97.8 F (36.6 C)     TempSrc: Oral     SpO2:  98% 93% 98%  Weight:        Intake/Output Summary (Last 24 hours) at 07/25/2023 1040 Last data filed at 07/25/2023 1000 Gross per 24 hour  Intake 955.69 ml  Output --  Net 955.69 ml      07/24/2023    2:48 AM 07/14/2023   10:07 AM 07/12/2023    1:11 PM  Last 3 Weights  Weight (lbs) 215 lb 6.2 oz 173 lb 173 lb  Weight (kg) 97.7 kg 78.472 kg 78.472 kg      Telemetry/ECG  SR, intermittent V pacing - Personally Reviewed  Physical Exam  General: Well developed, in no acute distress.  Neck: No JVD. LIJ temp wire. Chest: Right tunneled dialysis line. Cardiac: Bradycardic, regular rhythm.  Resp: Normal work of breathing.  Ext: No edema.  Neuro: No gross focal deficits.  Psych: Normal affect.   Assessment & Plan  #. Sinus arrest: Patient is having prolonged pauses, up to 8 seconds in length. Temp wire has been placed. She will ultimately need permanent pacemaker implant, preferably leadless due to HD. Leadless would necessitate general anesthesia, potentially hours if dual chamber leadless is done. Timing of this to be influenced by whether or not there are plans for her severe aortic stenosis and unrevascularized multivessel CAD.  - Continue temp wire. Threshold remains <1. Intermittently pacing. Backup rate at 40bpm as to not compete with her intrinsic bradycardia.  - Will discuss with structural team regarding plans for her severe aortic stenosis and  unrevascularized multivessel CAD. Addressing these prior to leadless implant would be ideal to minimize risk associated with general anesthesia for prolonged dual chamber leadless implant. Dr. Nancey has potential spot for dual chamber leadless pacemaker implant on Thursday.  #. ESRD - Dialysis per nephrology  #. LFLG severe aortic stenosis:  #. Multivessel CAD: -Potential plan for TAVR.  Will need to discuss with structural team next week.   For questions or updates, please contact  [facility] Please consult [email or web address] for contact info under       Signed, Fonda Kitty, MD

## 2023-07-25 NOTE — H&P (Signed)
 Patient name: Rhonda Clayton MRN: [id number] DOB: [birth year] Sex: female  REASON FOR VISIT:    Dialysis access  HISTORY OF PRESENT ILLNESS:   Rhonda Clayton is a 78 y.o. female with a right sided catheter that is not functioning.  She comes in today for exchange  CURRENT MEDICATIONS:    No current facility-administered medications for this encounter.   No current outpatient medications on file.   Facility-Administered Medications Ordered in Other Encounters  Medication Dose Route Frequency Provider Last Rate Last Admin   0.9 %  sodium chloride  infusion  250 mL Intravenous PRN Inocencio Soyla Lunger, MD 10 mL/hr at 07/25/23 1600 Infusion Verify at 07/25/23 1600   acetaminophen  (TYLENOL ) tablet 650 mg  650 mg Oral Q6H PRN Verlin Lonni JONETTA, MD       Or   acetaminophen  (TYLENOL ) suppository 650 mg  650 mg Rectal Q6H PRN Verlin Lonni JONETTA, MD       alteplase  (CATHFLO ACTIVASE ) injection 2 mg  2 mg Intracatheter Once PRN Verlin Lonni JONETTA, MD       anticoagulant sodium citrate  solution 5 mL  5 mL Intracatheter PRN Verlin Lonni JONETTA, MD       aspirin  EC tablet 81 mg  81 mg Oral Daily McLean, Dalton S, MD   81 mg at 07/25/23 9097   atorvastatin  (LIPITOR) tablet 40 mg  40 mg Oral Daily McLean, Dalton S, MD   40 mg at 07/25/23 9097   Chlorhexidine  Gluconate Cloth 2 % PADS 6 each  6 each Topical Daily Inocencio Soyla Lunger, MD   6 each at 07/25/23 0902   Darbepoetin Alfa  (ARANESP ) injection 40 mcg  40 mcg Subcutaneous Q Fri-1800 McAlhany, Christopher D, MD   40 mcg at 07/23/23 1830   ezetimibe  (ZETIA ) tablet 10 mg  10 mg Oral QHS Verlin Lonni JONETTA, MD   10 mg at 07/24/23 2132   feeding supplement (NEPRO CARB STEADY) liquid 237 mL  237 mL Oral PRN Verlin Lonni JONETTA, MD       furosemide  (LASIX ) tablet 40 mg  40 mg Oral Once per day on Sunday Tuesday Thursday Saturday Verlin Lonni JONETTA, MD   40 mg at 07/25/23 0902    heparin  injection 1,000 Units  1,000 Units Intracatheter PRN Verlin Lonni JONETTA, MD       heparin  injection 1,600 Units  20 Units/kg Dialysis PRN Verlin Lonni JONETTA, MD       heparin  injection 2,000 Units  2,000 Units Dialysis Once in dialysis Verlin Lonni JONETTA, MD       heparin  injection 2,000 Units  2,000 Units Dialysis PRN Verlin Lonni JONETTA, MD       heparin  injection 5,000 Units  5,000 Units Subcutaneous Q8H Verlin Lonni JONETTA, MD   5,000 Units at 07/25/23 1454   hydrALAZINE  (APRESOLINE ) injection 10 mg  10 mg Intravenous Q4H PRN Verlin Lonni JONETTA, MD       insulin  aspart (novoLOG ) injection 0-15 Units  0-15 Units Subcutaneous TID WC McLean, Dalton S, MD       insulin  aspart (novoLOG ) injection 0-5 Units  0-5 Units Subcutaneous QHS McLean, Dalton S, MD       ipratropium-albuterol  (DUONEB) 0.5-2.5 (3) MG/3ML nebulizer solution 3 mL  3 mL Nebulization Q4H PRN Verlin Lonni JONETTA, MD       levothyroxine  (SYNTHROID ) tablet 100 mcg  100 mcg Oral Once per day on Sunday Tuesday Wednesday Thursday Saturday Verlin Lonni JONETTA, MD   100 mcg at 07/25/23 [phone]  And   levothyroxine  (SYNTHROID ) tablet 50 mcg  50 mcg Oral Once per day on Monday Friday Verlin Lonni BIRCH, MD   50 mcg at 07/23/23 9183   lidocaine  (PF) (XYLOCAINE ) 1 % injection 5 mL  5 mL Intradermal PRN Verlin Lonni BIRCH, MD       lidocaine -prilocaine  (EMLA ) cream 1 Application  1 Application Topical PRN Verlin Lonni BIRCH, MD       lip balm (CARMEX) ointment   Topical PRN Inocencio Soyla Lunger, MD       Oral care mouth rinse  15 mL Mouth Rinse PRN Camnitz, Soyla Lunger, MD       pentafluoroprop-tetrafluoroeth (GEBAUERS) aerosol 1 Application  1 Application Topical PRN Verlin Lonni BIRCH, MD       polyethylene glycol (MIRALAX  / GLYCOLAX ) packet 17 g  17 g Oral Daily Rolan Ezra RAMAN, MD   17 g at 07/24/23 9052   senna-docusate (Senokot-S) tablet 1 tablet  1 tablet Oral QHS Rolan Ezra RAMAN, MD   1 tablet at 07/24/23 2132   sodium chloride  flush (NS) 0.9 % injection 3 mL  3 mL Intravenous Q12H Verlin Lonni BIRCH, MD   3 mL at 07/25/23 0902   sodium chloride  flush (NS) 0.9 % injection 3 mL  3 mL Intravenous PRN Verlin Lonni BIRCH, MD        REVIEW OF SYSTEMS:   [X]  denotes positive finding, [ ]  denotes negative finding Cardiac  Comments:  Chest pain or chest pressure:    Shortness of breath upon exertion:    Short of breath when lying flat:    Irregular heart rhythm:    Constitutional    Fever or chills:      PHYSICAL EXAM:   Vitals:   07/22/23 1159 07/22/23 1204 07/22/23 1209 07/22/23 1210  BP: (!) 152/90 (!) 152/90  (!) 150/80  Pulse: (!) 57 (!) 0 (!) 0   Resp: 17   14  Temp:      TempSrc:      SpO2: 100%       GENERAL: The patient is a well-nourished female, in no acute distress. The vital signs are documented above. CARDIOVASCULAR: There is a regular rate and rhythm. PULMONARY: Non-labored respirations   STUDIES:      MEDICAL ISSUES:   ESRD: I discussed proceeding with catheter exchange.  Details of the procedure were discussed with the patient she wished to proceed  Rhonda Serene CLORE, MD, FACS [facility] [phone] Pager [phone]

## 2023-07-25 NOTE — Progress Notes (Signed)
 Nephrology Follow-Up Consult note   Assessment/Recommendations: Rhonda Clayton is a/an 78 y.o. female with a past medical history significant for ESRD, admitted for symptomatic bradycardia.       Dialysis Orders: [facility] M,T,Th,S 3 hrs 180NRe 350/Autoflow 1.5 80 kg 2.0 K/ 2.0 Ca TDC - Heparin  2000 units IV three times per week - Mircera 75 mcg IV q 2 weeks (last dose 07/13/2023 next dose due 07/21/2023-did not receive) - Venofer  50 mg IV q week   Assessment/Plan:  Symptomatic Bradycardia: Temporary pacer in now.  Plan for pacemaker per cardiology when able. More complicated because may get TAVR and or CAD intervention as well AS-being worked up for TAVR. Per primary/Cards   ESRD -  Will have HD MWF while in hospital.  Continue on current schedule  Hypertension/volume  - BP and volume controlled. Continue Furosemide  on non HD days. UF as tolerated.  Consider higher dose of Lasix   Anemia  -ESA was given on 7/4  Metabolic bone disease -  BMD labs at goal. No binders/ VDRA yet  Nutrition - K+ on low side. Regular diet with fluid restrictions.    Recommendations conveyed to primary service.    Jayson JINNY Player [facility] 07/25/2023 8:22 AM  ___________________________________________________________  CC: Bradycardia  Interval History/Subjective: Feels better today. Breathing well. No complaints.   Medications:  Current Facility-Administered Medications  Medication Dose Route Frequency Provider Last Rate Last Admin   0.9 %  sodium chloride  infusion  250 mL Intravenous PRN Inocencio Soyla Lunger, MD 10 mL/hr at 07/25/23 0700 Infusion Verify at 07/25/23 0700   acetaminophen  (TYLENOL ) tablet 650 mg  650 mg Oral Q6H PRN Verlin Lonni JONETTA, MD       Or   acetaminophen  (TYLENOL ) suppository 650 mg  650 mg Rectal Q6H PRN Verlin Lonni JONETTA, MD       alteplase  (CATHFLO ACTIVASE ) injection 2 mg  2 mg Intracatheter Once PRN Verlin Lonni JONETTA, MD        anticoagulant sodium citrate  solution 5 mL  5 mL Intracatheter PRN Verlin Lonni JONETTA, MD       aspirin  EC tablet 81 mg  81 mg Oral Daily McLean, Dalton S, MD   81 mg at 07/24/23 9052   atorvastatin  (LIPITOR) tablet 40 mg  40 mg Oral Daily McLean, Dalton S, MD   40 mg at 07/24/23 9052   Chlorhexidine  Gluconate Cloth 2 % PADS 6 each  6 each Topical Daily Camnitz, Will Lunger, MD       Darbepoetin Alfa  (ARANESP ) injection 40 mcg  40 mcg Subcutaneous Q Fri-1800 McAlhany, Christopher D, MD   40 mcg at 07/23/23 1830   ezetimibe  (ZETIA ) tablet 10 mg  10 mg Oral QHS Verlin Lonni JONETTA, MD   10 mg at 07/24/23 2132   feeding supplement (NEPRO CARB STEADY) liquid 237 mL  237 mL Oral PRN Verlin Lonni JONETTA, MD       furosemide  (LASIX ) tablet 40 mg  40 mg Oral Once per day on Sunday Tuesday Thursday Saturday Verlin Lonni JONETTA, MD   40 mg at 07/24/23 9052   heparin  injection 1,000 Units  1,000 Units Intracatheter PRN Verlin Lonni JONETTA, MD       heparin  injection 1,600 Units  20 Units/kg Dialysis PRN Verlin Lonni JONETTA, MD       heparin  injection 2,000 Units  2,000 Units Dialysis Once in dialysis Verlin Lonni JONETTA, MD       heparin  injection 2,000 Units  2,000 Units  Dialysis PRN Verlin Lonni BIRCH, MD       heparin  injection 5,000 Units  5,000 Units Subcutaneous Q8H Verlin Lonni BIRCH, MD   5,000 Units at 07/25/23 9443   hydrALAZINE  (APRESOLINE ) injection 10 mg  10 mg Intravenous Q4H PRN Verlin Lonni BIRCH, MD       insulin  aspart (novoLOG ) injection 0-6 Units  0-6 Units Subcutaneous TID WC Verlin Lonni BIRCH, MD   3 Units at 07/25/23 9372   ipratropium-albuterol  (DUONEB) 0.5-2.5 (3) MG/3ML nebulizer solution 3 mL  3 mL Nebulization Q4H PRN Verlin Lonni BIRCH, MD       levothyroxine  (SYNTHROID ) tablet 100 mcg  100 mcg Oral Once per day on Sunday Tuesday Wednesday Thursday Saturday Verlin Lonni BIRCH, MD   100 mcg at 07/25/23 9443   And    levothyroxine  (SYNTHROID ) tablet 50 mcg  50 mcg Oral Once per day on Monday Friday Verlin Lonni BIRCH, MD   50 mcg at 07/23/23 9183   lidocaine  (PF) (XYLOCAINE ) 1 % injection 5 mL  5 mL Intradermal PRN Verlin Lonni BIRCH, MD       lidocaine -prilocaine  (EMLA ) cream 1 Application  1 Application Topical PRN Verlin Lonni BIRCH, MD       lip balm (CARMEX) ointment   Topical PRN Inocencio Soyla Lunger, MD       Oral care mouth rinse  15 mL Mouth Rinse PRN Camnitz, Soyla Lunger, MD       pentafluoroprop-tetrafluoroeth (GEBAUERS) aerosol 1 Application  1 Application Topical PRN Verlin Lonni BIRCH, MD       polyethylene glycol (MIRALAX  / GLYCOLAX ) packet 17 g  17 g Oral Daily McLean, Dalton S, MD   17 g at 07/24/23 9052   senna-docusate (Senokot-S) tablet 1 tablet  1 tablet Oral QHS McLean, Dalton S, MD   1 tablet at 07/24/23 2132   sodium chloride  flush (NS) 0.9 % injection 3 mL  3 mL Intravenous Q12H Verlin Lonni BIRCH, MD   3 mL at 07/24/23 2151   sodium chloride  flush (NS) 0.9 % injection 3 mL  3 mL Intravenous PRN Verlin Lonni BIRCH, MD          Review of Systems: 10 systems reviewed and negative except per interval history/subjective  Physical Exam: Vitals:   07/25/23 0700 07/25/23 0727  BP: (!) 101/56   Pulse: 62   Resp: 11   Temp:  97.8 F (36.6 C)  SpO2: 97%    No intake/output data recorded.  Intake/Output Summary (Last 24 hours) at 07/25/2023 9177 Last data filed at 07/25/2023 0700 Gross per 24 hour  Intake 945.6 ml  Output --  Net 945.6 ml   Constitutional: Tired-appearing, no acute distress ENMT: ears and nose without scars or lesions, MMM CV: normal rate (paced), no edema Respiratory: bilateral chest rise, normal work of breathing Gastrointestinal: soft, non-tender, no palpable masses or hernias Skin: no visible lesions or rashes Psych: alert, judgement/insight appropriate, appropriate mood and affect   Test Results I personally reviewed new  and old clinical labs and radiology tests Lab Results  Component Value Date   NA 134 (L) 07/25/2023   K 3.7 07/25/2023   CL 100 07/25/2023   CO2 22 07/25/2023   BUN 33 (H) 07/25/2023   CREATININE 3.97 (H) 07/25/2023   CALCIUM  8.8 (L) 07/25/2023   ALBUMIN  3.2 (L) 07/22/2023   PHOS 2.9 07/24/2023    CBC Recent Labs  Lab 07/22/23 1352 07/22/23 2305 07/23/23 0910  WBC 5.7 6.6 5.5  NEUTROABS 2.9  --   --  HGB 10.4* 10.1* 9.8*  HCT 34.7* 32.3* 31.0*  MCV 93.0 89.7 89.9  PLT 255 268 262

## 2023-07-25 NOTE — Op Note (Signed)
    Patient name: Rhonda Clayton MRN: [id number] DOB: Apr 08, 1945 Sex: female  07/22/2023 Pre-operative Diagnosis: ESRD Post-operative diagnosis:  Same Surgeon:  Malvina New Procedure:   Exchange of right internal jugular vein tunneled dialysis cath under fluoroscopic guidance  Indications: The patient is having trouble with her catheter and so she is here today for exchange  Procedure:  The patient was identified in the holding area and taken to [facility].  The patient was prepped and draped in usual sterile fashion.  1% lidocaine  was used to anesthetize the skin around the cuff which exited just below the clavicle.  I then used sharp dissection to free up the cuff.  Once this was done a Glidewire advantage was navigated into the inferior vena cava.  The catheter was removed over the wire.  A new 19 cm catheter was then inserted into the cuff  at the level of the skin exit site.  The catheter tip was at the cavoatrial junction.  There were no kinks within the catheter.  Both ports flushed aspirated without difficulty.  They were filled the appropriate volumes with heparin .  The catheter was sutured into the skin with 2-0 nylon and sterile dressings were applied.  There were no immediate complications.  Impression:   #1: Successful placement of a new right internal jugular vein tunneled dialysis catheter   #2: Catheter is ready for immediate use   V. Malvina New, M.D., [facility] [place] Office: [phone] Pager:  [phone]

## 2023-07-25 NOTE — Progress Notes (Signed)
 [name], female   DOB: 12/22/1945, 78 y.o.   MRN: [id number]     Advanced Heart Failure Rounding Note  Cardiologist: Lurena MARLA Red, MD  Chief Complaint: Syncope Subjective:    No chest pain or dyspnea.   TTVP in place, pacing occasionally but generally NSR. Backup set to 40 bpm.    Objective:   Weight Range: 97.7 kg Body mass index is 40.7 kg/m.   Vital Signs:   Temp:  [97.3 F (36.3 C)-98.3 F (36.8 C)] 97.8 F (36.6 C) (07/06 0727) Pulse Rate:  [41-65] 65 (07/06 0900) Resp:  [5-27] 16 (07/06 0900) BP: (94-156)/(35-84) 120/43 (07/06 0900) SpO2:  [92 %-100 %] 93 % (07/06 0900) Last BM Date : 07/24/23  Weight change: Filed Weights   07/24/23 0248  Weight: 97.7 kg    Intake/Output:   Intake/Output Summary (Last 24 hours) at 07/25/2023 0937 Last data filed at 07/25/2023 0800 Gross per 24 hour  Intake 1075.62 ml  Output --  Net 1075.62 ml      Physical Exam    General: NAD Neck: No JVD, no thyromegaly or thyroid  nodule.  Lungs: Clear to auscultation bilaterally with normal respiratory effort. CV: Nondisplaced PMI.  Heart regular S1/S2, no S3/S4, 3/6 SEM RUSB with some obscuring of S2.  No peripheral edema.   Abdomen: Soft, nontender, no hepatosplenomegaly, no distention.  Skin: Intact without lesions or rashes.  Neurologic: Alert and oriented x 3.  Psych: Normal affect. Extremities: No clubbing or cyanosis.  HEENT: Normal.   Telemetry   NSR 60s, intermittent v-pacing (personally reviewed)  Labs    CBC Recent Labs    07/22/23 1352 07/22/23 2305 07/23/23 0910  WBC 5.7 6.6 5.5  NEUTROABS 2.9  --   --   HGB 10.4* 10.1* 9.8*  HCT 34.7* 32.3* 31.0*  MCV 93.0 89.7 89.9  PLT 255 268 262   Basic Metabolic Panel Recent Labs    92/96/74 2305 07/22/23 2307 07/24/23 0331 07/25/23 0257  NA 141   < > 137 134*  K 3.5   < > 3.5 3.7  CL 105   < > 100 100  CO2 25   < > 24 22  GLUCOSE 217*   < > 154* 282*  BUN 29*   < > 20 33*   CREATININE 4.03*   < > 2.72* 3.97*  CALCIUM  8.9   < > 9.0 8.8*  MG  --   --  1.8 1.9  PHOS 4.1  --  2.9  --    < > = values in this interval not displayed.   Liver Function Tests Recent Labs    07/22/23 2305  ALBUMIN  3.2*   No results for input(s): LIPASE, AMYLASE in the last 72 hours. Cardiac Enzymes No results for input(s): CKTOTAL, CKMB, CKMBINDEX, TROPONINI in the last 72 hours.  BNP: BNP (last 3 results) Recent Labs    01/03/23 0925 06/12/23 1945 07/22/23 1456  BNP 709.1* 1,627.4* 1,346.7*    ProBNP (last 3 results) No results for input(s): PROBNP in the last 8760 hours.   D-Dimer No results for input(s): DDIMER in the last 72 hours. Hemoglobin A1C No results for input(s): HGBA1C in the last 72 hours. Fasting Lipid Panel No results for input(s): CHOL, HDL, LDLCALC, TRIG, CHOLHDL, LDLDIRECT in the last 72 hours. Thyroid  Function Tests Recent Labs    07/22/23 2307  TSH 1.223    Other results:   Imaging    No results found.  Medications:     Scheduled Medications:  aspirin  EC  81 mg Oral Daily   atorvastatin   40 mg Oral Daily   Chlorhexidine  Gluconate Cloth  6 each Topical Daily   darbepoetin (ARANESP ) injection - DIALYSIS  40 mcg Subcutaneous Q Fri-1800   ezetimibe   10 mg Oral QHS   furosemide   40 mg Oral Once per day on Sunday Tuesday Thursday Saturday   heparin   2,000 Units Dialysis Once in dialysis   heparin   5,000 Units Subcutaneous Q8H   insulin  aspart  0-5 Units Subcutaneous QHS   insulin  aspart  0-9 Units Subcutaneous TID WC   levothyroxine   100 mcg Oral Once per day on Sunday Tuesday Wednesday Thursday Saturday   And   levothyroxine   50 mcg Oral Once per day on Monday Friday   polyethylene glycol  17 g Oral Daily   senna-docusate  1 tablet Oral QHS   sodium chloride  flush  3 mL Intravenous Q12H    Infusions:  sodium chloride  250 mL (07/25/23 0856)   anticoagulant sodium citrate       PRN  Medications: sodium chloride , acetaminophen  **OR** acetaminophen , alteplase , anticoagulant sodium citrate , feeding supplement (NEPRO CARB STEADY), heparin , heparin , heparin , hydrALAZINE , ipratropium-albuterol , lidocaine  (PF), lidocaine -prilocaine , lip balm, mouth rinse, pentafluoroprop-tetrafluoroeth, sodium chloride  flush    Assessment/Plan   1. Bradycardia: History of trifascicular block.  Admitted with syncopal episodes and found to have long symptomatic sinus pauses.  TTVP placed.  - Maintain TTVP, backup set 40 bpm.  - She will need permanent pacemaker, given ESRD Dr. Nancey has been planning on leadless device.  Suspect TAVR first with TTVP in place then PPM.  - EP following.  2. ESRD: Recently started HD.   - Nephrology following, next HD on Monday.  3. CAD: Diffuse CAD noted on 6/25 LHC with 80% mid and 99% distal LAD stenosis, TO D1, 80% mLCx with 70% distal LCx.  No intervention given diffuse mid to distal LAD and LCx disease and no ACS.  She denies chest pain.  Plan to continue medical management for now.  - ASA 81 - Zetia  10 mg daily.  - I added atorvastatin  40 mg daily.  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  She does not want to take a medication that involves injections either.  4. Aortic stenosis: Low flow/low gradient severe AS. Echo in 5/25 with EF 35-40%, peri-apical severe hypokinesis, AVA 0.89 cm^2 with mean gradient 26 mmHg. She has been undergoing TAVR workup.  - She needs her TAVR CTs - She will need to be seen Monday by structural heart team.  Will need TAVR then pacemaker placement.  5. Chronic systolic CHF:  Echo in 5/25 with EF 35-40%, peri-apical severe hypokinesis, low flow/low gradient severe AS with AVA 0.89 cm^2 with mean gradient 26 mmHg. 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  She does not look volume  overloaded on exam.  - Volume control via HD.  - No beta blocker with sinus pauses.  - GDMT limited by ESRD.   CRITICAL CARE Performed by: Ezra Shuck  Total critical care time: 40 minutes  Critical care time was exclusive of separately billable procedures and treating other patients.  Critical care was necessary to treat or prevent imminent or life-threatening deterioration.  Critical care was time spent personally by me on the following activities: development of treatment  plan with patient and/or surrogate as well as nursing, discussions with consultants, evaluation of patient's response to treatment, examination of patient, obtaining history from patient or surrogate, ordering and performing treatments and interventions, ordering and review of laboratory studies, ordering and review of radiographic studies, pulse oximetry and re-evaluation of patient's condition.   Length of Stay: 3  Ezra Shuck, MD  07/25/2023, 9:37 AM  Advanced Heart Failure Team Pager [phone] (M-F; 7a - 5p)  Please contact [facility] for night-coverage after hours (5p -7a ) and weekends on [email or web address]

## 2023-07-25 NOTE — Plan of Care (Signed)

## 2023-07-26 ENCOUNTER — Inpatient Hospital Stay (HOSPITAL_COMMUNITY)

## 2023-07-26 ENCOUNTER — Encounter (HOSPITAL_COMMUNITY): Payer: Self-pay | Admitting: Surgery

## 2023-07-26 DIAGNOSIS — I495 Sick sinus syndrome: Secondary | ICD-10-CM

## 2023-07-26 DIAGNOSIS — I350 Nonrheumatic aortic (valve) stenosis: Secondary | ICD-10-CM

## 2023-07-26 DIAGNOSIS — E1122 Type 2 diabetes mellitus with diabetic chronic kidney disease: Secondary | ICD-10-CM

## 2023-07-26 DIAGNOSIS — I132 Hypertensive heart and chronic kidney disease with heart failure and with stage 5 chronic kidney disease, or end stage renal disease: Secondary | ICD-10-CM

## 2023-07-26 DIAGNOSIS — E669 Obesity, unspecified: Secondary | ICD-10-CM

## 2023-07-26 DIAGNOSIS — I429 Cardiomyopathy, unspecified: Secondary | ICD-10-CM

## 2023-07-26 DIAGNOSIS — E1159 Type 2 diabetes mellitus with other circulatory complications: Secondary | ICD-10-CM

## 2023-07-26 DIAGNOSIS — N186 End stage renal disease: Secondary | ICD-10-CM

## 2023-07-26 DIAGNOSIS — I5022 Chronic systolic (congestive) heart failure: Secondary | ICD-10-CM | POA: Diagnosis not present

## 2023-07-26 DIAGNOSIS — Z992 Dependence on renal dialysis: Secondary | ICD-10-CM

## 2023-07-26 DIAGNOSIS — I5020 Unspecified systolic (congestive) heart failure: Secondary | ICD-10-CM

## 2023-07-26 DIAGNOSIS — R001 Bradycardia, unspecified: Secondary | ICD-10-CM | POA: Diagnosis not present

## 2023-07-26 DIAGNOSIS — I453 Trifascicular block: Secondary | ICD-10-CM

## 2023-07-26 DIAGNOSIS — I080 Rheumatic disorders of both mitral and aortic valves: Secondary | ICD-10-CM

## 2023-07-26 DIAGNOSIS — I2510 Atherosclerotic heart disease of native coronary artery without angina pectoris: Secondary | ICD-10-CM

## 2023-07-26 LAB — CBC
HCT: 33 % — ABNORMAL LOW (ref 36.0–46.0)
HCT: 31.9 % — ABNORMAL LOW (ref 36.0–46.0)
Hemoglobin: 10.3 g/dL — ABNORMAL LOW (ref 12.0–15.0)
Hemoglobin: 9.9 g/dL — ABNORMAL LOW (ref 12.0–15.0)
MCH: 28.2 pg (ref 26.0–34.0)
MCH: 27.8 pg (ref 26.0–34.0)
MCHC: 31.2 g/dL (ref 30.0–36.0)
MCHC: 31 g/dL (ref 30.0–36.0)
MCV: 90.4 fL (ref 80.0–100.0)
MCV: 89.6 fL (ref 80.0–100.0)
Platelets: 273 K/uL (ref 150–400)
Platelets: 260 K/uL (ref 150–400)
RBC: 3.65 MIL/uL — ABNORMAL LOW (ref 3.87–5.11)
RBC: 3.56 MIL/uL — ABNORMAL LOW (ref 3.87–5.11)
RDW: 16 % — ABNORMAL HIGH (ref 11.5–15.5)
RDW: 16 % — ABNORMAL HIGH (ref 11.5–15.5)
WBC: 6.9 K/uL (ref 4.0–10.5)
WBC: 6.1 K/uL (ref 4.0–10.5)
nRBC: 0 % (ref 0.0–0.2)
nRBC: 0 % (ref 0.0–0.2)

## 2023-07-26 LAB — BASIC METABOLIC PANEL WITH GFR
Anion gap: 10 (ref 5–15)
BUN: 41 mg/dL — ABNORMAL HIGH (ref 8–23)
CO2: 25 mmol/L (ref 22–32)
Calcium: 9 mg/dL (ref 8.9–10.3)
Chloride: 103 mmol/L (ref 98–111)
Creatinine, Ser: 4.33 mg/dL — ABNORMAL HIGH (ref 0.44–1.00)
GFR, Estimated: 10 mL/min — ABNORMAL LOW (ref >60)
Glucose, Bld: 85 mg/dL (ref 70–99)
Potassium: 3.9 mmol/L (ref 3.5–5.1)
Sodium: 138 mmol/L (ref 135–145)

## 2023-07-26 LAB — GLUCOSE, CAPILLARY
Glucose-Capillary: 139 mg/dL — ABNORMAL HIGH (ref 70–99)
Glucose-Capillary: 264 mg/dL — ABNORMAL HIGH (ref 70–99)
Glucose-Capillary: 266 mg/dL — ABNORMAL HIGH (ref 70–99)
Glucose-Capillary: 204 mg/dL — ABNORMAL HIGH (ref 70–99)

## 2023-07-26 LAB — TYPE AND SCREEN
ABO/RH(D): B POS
Antibody Screen: NEGATIVE

## 2023-07-26 LAB — MAGNESIUM: Magnesium: 1.9 mg/dL (ref 1.7–2.4)

## 2023-07-26 MED ORDER — CHLORHEXIDINE GLUCONATE 4 % EX SOLN
1.0000 | Freq: Once | CUTANEOUS | Status: AC
  Administered 2023-07-26: 1 via TOPICAL
  Filled 2023-07-26: qty 60

## 2023-07-26 MED ORDER — IOHEXOL 350 MG/ML SOLN
100.0000 mL | Freq: Once | INTRAVENOUS | Status: AC | PRN
  Administered 2023-07-26: 100 mL via INTRAVENOUS

## 2023-07-26 MED ORDER — CHLORHEXIDINE GLUCONATE 0.12 % MT SOLN
15.0000 mL | Freq: Once | OROMUCOSAL | Status: AC
  Administered 2023-07-27: 15 mL via OROMUCOSAL
  Filled 2023-07-26: qty 15

## 2023-07-26 MED ORDER — BISACODYL 5 MG PO TBEC
5.0000 mg | DELAYED_RELEASE_TABLET | Freq: Once | ORAL | Status: AC
  Administered 2023-07-26: 5 mg via ORAL
  Filled 2023-07-26: qty 1

## 2023-07-26 MED ORDER — ALTEPLASE 2 MG IJ SOLR
INTRAMUSCULAR | Status: AC
  Filled 2023-07-26: qty 4

## 2023-07-26 MED ORDER — MIDODRINE HCL 5 MG PO TABS
5.0000 mg | ORAL_TABLET | ORAL | Status: DC
  Administered 2023-07-26 – 2023-07-30 (×3): 5 mg via ORAL
  Filled 2023-07-26 (×3): qty 1

## 2023-07-26 NOTE — Progress Notes (Signed)
   07/26/23 2309  Vitals  Temp 97.7 F (36.5 C)  Pulse Rate (!) 52  Resp 14  BP 98/66  SpO2 100 %  O2 Device Room Air  Post Treatment  Dialyzer Clearance Heavily streaked  Hemodialysis Intake (mL) 0 mL  Liters Processed 35.9  Fluid Removed (mL) 2000 mL  Tolerated HD Treatment Yes   Received patient in bed to unit.  Alert and oriented.  Informed consent signed and in chart.   TX duration:2hrs  Patient tolerated well.  Transported back to the room  Alert, without acute distress.  Hand-off given to patient's nurse.   Access used: [facility] X[facility] Access issues: poorly functioning activase  dwell successful  Total UF removed: 2L Medication(s) given: none   [facility]

## 2023-07-26 NOTE — Progress Notes (Addendum)
  Patient Name: Rhonda Clayton Date of Encounter: 07/26/2023  Primary Cardiologist: Arun K Thukkani, MD Electrophysiologist: Soyla Gladis Norton, MD  Interval Summary   Feeling OK this am. Is having some dizziness, but only when she lies back.  Has previously had inner ear issues.   Vital Signs    Vitals:   07/26/23 0400 07/26/23 0500 07/26/23 0502 07/26/23 0600  BP: (!) 141/48 (!) 99/40 (!) 105/44 (!) 109/39  Pulse: 65 (!) 58 (!) 58 63  Resp: 17 16 17 18   Temp:    97.8 F (36.6 C)  TempSrc:    Oral  SpO2: 93% 95% 98% 96%  Weight:  76.8 kg      Intake/Output Summary (Last 24 hours) at 07/26/2023 0723 Last data filed at 07/26/2023 0600 Gross per 24 hour  Intake 809.88 ml  Output 800 ml  Net 9.88 ml   Filed Weights   07/24/23 0248 07/26/23 0500  Weight: 97.7 kg 76.8 kg    Physical Exam    GEN- The patient is well appearing, alert and oriented x 3 today.   Lungs- Clear to ausculation bilaterally, normal work of breathing Cardiac- Regular rate and rhythm, no murmurs, rubs or gallops GI- soft, NT, ND, + BS Extremities- no clubbing or cyanosis. No edema  Telemetry    NSR 60s with intermittent pacing in 40s (personally reviewed)  Hospital Course    Rhonda Clayton is a 78 y.o. female with a hx of low-flow low gradient aortic stenosis, diabetes, hyperlipidemia, hypertension, end-stage renal disease, trifascicular block. EP consulted with intermittent pauses of up to 8 seconds.   Plan definitive management of her AS -> Leadless PPM - Timing planning Structural eval  Assessment & Plan    Sinus arrest With pauses up to 8 seconds in length.  Temp wire stable with threshold < 1.0. Intermittent pacing with back up rate at 40 bpm to not compete with her baseline bradycardia Given ESRD will require LEADLESS PPM, which would require potentially hours of general sedation for dual chamber device(s).  As below, will further discuss timing pending Structural eval and  recommendations  Severe AS Multivessel CAD Potential plan for TAVR Discussed with Dr. Wonda who will see this afternoon.   ESRD Dialysis per nephrology    For questions or updates, please contact [facility] Please consult [email or web address] for contact info under     Signed, Ozell Prentice Passey, PA-C  07/26/2023, 7:23 AM

## 2023-07-26 NOTE — Consult Note (Cosign Needed)
 HEART AND VASCULAR CENTER   MULTIDISCIPLINARY HEART VALVE TEAM  Cardiology Consultation:   [name] MRN: [id number]; DOB: 01/01/1946  Admit date: 07/22/2023 Date of Consult: 07/26/2023  Primary Care Provider: [name], MD [facility] Cardiologist: Arun K Thukkani, MD  [facility] Electrophysiologist:  Will Gladis Norton, MD    Patient Profile:   Rhonda Clayton is a 78 y.o. 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 Nancey.  History of Present Illness:   Rhonda Clayton was referred to Dr. Wendel by her nephrologist for bradycardia. Monitor in 3/30325 showed sinus pauses in the setting of trifascicular block.  She was referred to EP and seen by Dr. Nancey. Given commodities, he felt a leadless Medtronic Micra AV would be best if deemed a TAVR candidate.  NM PET stress test 06/08/2023 demonstrated peri-infarct ischemia in the entire LAD distribution. She was admitted 5/24-5/27/25 for acute CHF. Echo 06/14/23 with LVEF of 35-40%, hypokinesis of the mid-apical, anterior, anterolateral and apical wall, G2DD, mod MAC with mild MR and severe LFLG AS with mean gradient 26 mm hg, AVA 0.89 cm2, DVI 0.28 and SVI 38. She was diuresed with IV lasix  which was limited by her stage IV CKD. She was then readmitted shortly after from 5/29-06/20/23 for recurrent CHF. She was started on HD and tunneled catheter was placed and volume managed with HD.   LHC 07/14/23 showed diffuse coronary artery disease of the mid to distal LAD, diagonal, and left circumflex systems. The lesions were relatively distal and not felt to explain the patient's cardiomyopathy. Given the lack of chest pain, plan was for medical management.   She presented to [facility] ED on 07/22/23 for sinus pauses and presyncope. Found to have up to 8 second pauses on telemetry.  Temp pacer was placed on 07/23/23 and she was admitted to the ICU. Seen by Dr. Nancey and given need for several hours of general sedation, it was felt best to proceed with TAVR prior to leadless PPM insertion.   Cardiac gated CTA of the heart reveals anatomical characteristics consistent with aortic stenosis suitable for treatment by transcatheter aortic valve replacement without any significant complicating features and CTA of the aorta and iliac vessels demonstrate what appear to be adequate pelvic vascular access to facilitate a transfemoral approach.        Past Medical History:  Diagnosis Date   Anemia    CKD (chronic kidney disease) stage 5, GFR less than 15 ml/min (HCC)    Cystocele    DJD (degenerative joint disease)    DM (diabetes mellitus) (HCC)    Esophageal stricture    Essential hypertension    Hyperlipidemia    Hypothyroidism    Lichen sclerosus    Osteopenia    RBBB with left anterior fascicular block    Rectocele    Stress incontinence    Torn rotator cuff    Bilateral   Type 2 diabetes mellitus (HCC)    Venous insufficiency    Vitreous hemorrhage, left (HCC) 05/25/2019   The nature of the vitreous hemorrhage was discussed with the patient as well as the common causes.   Patients with diabetic eye disease may develop retinal neovascularization.  Other eye conditions develop retinal  neovascularization secondary to retinal venous occlusions.   Vitreous hemorrhage may result from spontaneous vitreous detachment  or retinal breaks.  Blunt trauma is a common cause as wl    Past Surgical History:  Procedure Laterality Date   ABDOMINAL AORTOGRAM N/A 07/14/2023   Procedure: ABDOMINAL AORTOGRAM;  Surgeon: Wendel Lurena POUR, MD;  Location: [facility] INVASIVE [facility] LAB;  Service: Cardiovascular;  Laterality: N/A;   ABDOMINAL EXPOSURE N/A 05/09/2014   Procedure: ABDOMINAL EXPOSURE;  Surgeon: Krystal JULIANNA Doing, MD;  Location: [facility] NEURO ORS;  Service: Vascular;  Laterality: N/A;   ANTERIOR LAT  LUMBAR FUSION Right 05/09/2014   Procedure: Lumbar three-four, Lumbar four-five Anterior lateral lumbar fusion;  Surgeon: Fairy Levels, MD;  Location: [facility] NEURO ORS;  Service: Neurosurgery;  Laterality: Right;   ANTERIOR LUMBAR FUSION N/A 05/09/2014   Procedure: Stage 1:Lumbar four-five, Lumbar five-Sacral one Anterior lumbar interbody fusion with Dr. Doing for approach ;  Surgeon: Fairy Levels, MD;  Location: [facility] NEURO ORS;  Service: Neurosurgery;  Laterality: N/A;   CHOLECYSTECTOMY  1987   INCONTINENCE SURGERY     INSERTION OF DIALYSIS CATHETER Right 06/17/2023   Procedure: INSERTION OF DIALYSIS CATHETER;  Surgeon: Pearline Norman RAMAN, MD;  Location: [facility] OR;  Service: Vascular;  Laterality: Right;   LEFT HEART CATH AND CORONARY ANGIOGRAPHY N/A 07/14/2023   Procedure: LEFT HEART CATH AND CORONARY ANGIOGRAPHY;  Surgeon: Wendel Lurena POUR, MD;  Location: [facility];  Service: Cardiovascular;  Laterality: N/A;   LUMBAR PERCUTANEOUS PEDICLE SCREW 3 LEVEL N/A 05/10/2014   Procedure: Stage 2: Percutaneous Pedicle Screws; Laminectomy L3-4  ;  Surgeon: Fairy Levels, MD;  Location: [facility];  Service: Neurosurgery;  Laterality: N/A;  Stage 2: Percutaneous Pedicle Screws T10 to Ileum; Laminectomy L3-4     RECTAL SURGERY     TEMPORARY PACEMAKER N/A 07/23/2023   Procedure: TEMPORARY PACEMAKER;  Surgeon: Verlin Lonni BIRCH, MD;  Location: [facility];  Service: Cardiovascular;  Laterality: N/A;   TUNNELLED CATHETER EXCHANGE N/A 07/22/2023   Procedure: TUNNELLED CATHETER EXCHANGE;  Surgeon: Serene Gaile ORN, MD;  Location: [facility];  Service: Cardiovascular;  Laterality: N/A;   VAGINA SURGERY  2018   Almetta Planas Touch- Vaginal Dryness     Home Medications:  Prior to Admission medications   Medication Sig Start Date End Date Taking? Authorizing Provider  aspirin  EC 81 MG tablet Take 1 tablet (81 mg total) by mouth daily. Swallow whole. 06/16/23  Yes Henry Shaver B, NP  betamethasone valerate  (VALISONE) 0.1 % cream Apply 1 Application topically as needed (skin condition). 02/23/23  Yes [provider]  Coenzyme Q10 (COQ-10 PO) Take 1 tablet by mouth at bedtime.   Yes [provider]  D-MANNOSE PO Take 1 tablet by mouth at bedtime.   Yes [provider]  ezetimibe  (ZETIA ) 10 MG tablet Take 10 mg by mouth daily.   Yes [provider]  Folic Acid -Vit B6-Vit B12 (FOLBEE) 2.5-25-1 MG TABS tablet Take 1 tablet by mouth at bedtime.   Yes [provider]  furosemide  (LASIX ) 40 MG tablet Take 40 mg by mouth See admin instructions. Pt takes 40 mg on Wed,Sat,Sundays 07/01/23  Yes [provider]  levothyroxine  (SYNTHROID ) 100 MCG tablet Take 100 mcg by mouth See admin instructions. Takes 100 mcg daily except for Monday and Friday she takes 50 mcg   Yes [provider]  nitroGLYCERIN  (NITROSTAT ) 0.4 MG SL tablet Place 1 tablet (0.4 mg total) under the tongue every 5 (five) minutes as needed for chest pain. 06/01/23 08/30/23 Yes Camnitz, Soyla Lunger, MD  NOVOLOG  MIX 70/30 FLEXPEN (70-30) 100 UNIT/ML FlexPen Inject into the skin in the morning and at bedtime. Takes 14-15 units in the morning and 9-10 units at before dinner According to how many carbohydrates she eats   Yes [provider]  pantoprazole  (PROTONIX ) 40 MG tablet Take 1.5 tablets by mouth daily as needed (Heartburn). 06/03/23  Yes [provider]  B-D UF III MINI PEN NEEDLES 31G X 5 MM MISC Inject into the skin 2 (two) times daily. 03/01/23   [provider]  Continuous Glucose Sensor (FREESTYLE LIBRE 3 SENSOR) MISC change every 14 days topically to monitor blood glucose continuously 28 days 03/01/23   [provider]  Lancets [facility] DELICA PLUS Noel) MISC Apply topically 3 (three) times daily. 02/22/23   [provider]  [facility] VERIO test strip 3 (three) times daily. 03/03/23   [provider]    Inpatient  Medications: Scheduled Meds:  aspirin  EC  81 mg Oral Daily   atorvastatin   40 mg Oral Daily   Chlorhexidine  Gluconate Cloth  6 each Topical Daily   darbepoetin (ARANESP ) injection - DIALYSIS  40 mcg Subcutaneous Q Fri-1800   ezetimibe   10 mg Oral QHS   furosemide   40 mg Oral Once per day on Sunday Tuesday Thursday Saturday   heparin   5,000 Units Subcutaneous Q8H   insulin  aspart  0-15 Units Subcutaneous TID WC   insulin  aspart  0-5 Units Subcutaneous QHS   levothyroxine   100 mcg Oral Once per day on Sunday Tuesday Wednesday Thursday Saturday   And   levothyroxine   50 mcg Oral Once per day on Monday Friday   midodrine   5 mg Oral Q M,W,F-HD   polyethylene glycol  17 g Oral Daily   senna-docusate  1 tablet Oral QHS   sodium chloride  flush  3 mL Intravenous Q12H   Continuous Infusions:  sodium chloride  10 mL/hr at 07/26/23 1400   anticoagulant sodium citrate      PRN Meds: sodium chloride , acetaminophen  **OR** acetaminophen , alteplase , anticoagulant sodium citrate , feeding supplement (NEPRO CARB STEADY), heparin , heparin , heparin , hydrALAZINE , ipratropium-albuterol , lidocaine  (PF), lidocaine -prilocaine , lip balm, mouth rinse, pentafluoroprop-tetrafluoroeth, sodium chloride  flush  Allergies:    Allergies  Allergen Reactions   Coffee Flavoring Agent (Non-Screening) Other (See Comments)    Sick on stomach, sneezing, backed up   Azithromycin  Other (See Comments)   Fluogen [Influenza Virus Vaccine]    Molds & Smuts     Allergy test showed    Pneumococcal Vac Polyvalent Other (See Comments)    Large site reaction    Tobacco Nausea And Vomiting   Versed  [Midazolam ] Other (See Comments)    No memory altering medications. Patient request.   Cephalexin  Other (See Comments)    Yeast infection   Ciprofloxacin Other (See Comments)    tendinitis   Oatmeal Other (See Comments)    Sneezing and drainage   Sulfa Antibiotics Other (See Comments)    Yeast infection    Social History:    Social History   Socioeconomic History   Marital status: Widowed    Spouse name: Not on file   Number of children: 2   Years of education: Not on file   Highest education level: Not on file  Occupational History   Occupation: Retired  Tobacco Use   Smoking status: Never   Smokeless tobacco: Never  Vaping Use   Vaping status: Never Used  Substance and Sexual Activity   Alcohol  use: No   Drug use: Yes  Types: Hydromorphone    Sexual activity: Not on file  Other Topics Concern   Not on file  Social History Narrative   Not on file   Social Drivers of Health   Financial Resource Strain: Not on file  Food Insecurity: No Food Insecurity (07/23/2023)   Hunger Vital Sign    Worried About Running Out of Food in the Last Year: Never true    Ran Out of Food in the Last Year: Never true  Transportation Needs: No Transportation Needs (07/23/2023)   PRAPARE - Administrator, Civil Service (Medical): No    Lack of Transportation (Non-Medical): No  Physical Activity: Not on file  Stress: Not on file  Social Connections: Patient Declined (07/23/2023)   Social Connection and Isolation Panel    Frequency of Communication with Friends and Family: Patient declined    Frequency of Social Gatherings with Friends and Family: Patient declined    Attends Religious Services: Patient declined    Database administrator or Organizations: Patient declined    Attends Banker Meetings: Patient declined    Marital Status: Patient declined  Intimate Partner Violence: Not At Risk (07/23/2023)   Humiliation, Afraid, Rape, and Kick questionnaire    Fear of Current or Ex-Partner: No    Emotionally Abused: No    Physically Abused: No    Sexually Abused: No    Family History:    Family History  Problem Relation Age of Onset   Multiple myeloma Mother    Cancer Maternal Aunt        Cancer in the mouth - dipped snuff   Heart disease Brother    Diabetes Father    Colon polyps  Brother    Heart disease Maternal Uncle    Heart disease Paternal Uncle    Heart disease Paternal Grandfather      ROS:  Please see the history of present illness.  All other ROS reviewed and negative.     Physical Exam/Data:   Vitals:   07/26/23 1135 07/26/23 1200 07/26/23 1300 07/26/23 1400  BP:  125/64 (!) 130/47 (!) 127/55  Pulse: (!) 42 (!) 42 (!) 47 (!) 41  Resp: 15 19 15 11   Temp: (!) 97.5 F (36.4 C)     TempSrc: Oral     SpO2: 100% 99% 99% 100%  Weight:        Intake/Output Summary (Last 24 hours) at 07/26/2023 1614 Last data filed at 07/26/2023 1400 Gross per 24 hour  Intake 1039.84 ml  Output 800 ml  Net 239.84 ml      07/26/2023    5:00 AM 07/24/2023    2:48 AM 07/14/2023   10:07 AM  Last 3 Weights  Weight (lbs) 169 lb 5 oz 215 lb 6.2 oz 173 lb  Weight (kg) 76.8 kg 97.7 kg 78.472 kg     Body mass index is 31.99 kg/m.  General:  Well nourished, well developed, in no acute distress HEENT: normal Lymph: no adenopathy Neck: no JVD Cardiac:  normal S1, S2; RRR; distant heart sounds with harsh 3/6 SEM @ RUSB. Lungs:  clear to auscultation bilaterally, no wheezing, rhonchi or rales  Abd: soft, nontender, no hepatomegaly  Ext: no edema Musculoskeletal:  No deformities, BUE and BLE strength normal and equal Skin: warm and dry  Neuro:  CNs 2-12 intact, no focal abnormalities noted Psych:  Normal affect   EKG:  The EKG was personally reviewed and demonstrates:  07/22/23: sinus with long sinus  pause, HR 68 Telemetry:  Telemetry was personally reviewed and demonstrates:  paced.   Cardiac Studies & Procedures   ______________________________________________________________________________________________ CARDIAC CATHETERIZATION  CARDIAC CATHETERIZATION 07/23/2023  Conclusion Sinus pauses Successful placement of transvenous pacing wire from the left internal jugular vein   CARDIAC CATHETERIZATION  CARDIAC CATHETERIZATION 07/14/2023  Conclusion   1st Diag  lesion is 100% stenosed.   Mid LAD lesion is 80% stenosed.   Dist LAD-1 lesion is 99% stenosed.   Dist LAD-2 lesion is 80% stenosed.   Prox Cx to Mid Cx lesion is 80% stenosed.   Mid Cx to Dist Cx lesion is 70% stenosed.  1.  Diffuse coronary artery disease of the mid to distal LAD, diagonal, and left circumflex systems.  These are lesions are relatively distal and do not explain the patient's cardiomyopathy.  Given the lack of chest pain he is will be treated medically. 2.  Capacious iliofemoral vessels bilaterally; if TAVR is pursued the right side seems to be less tortuous.  The femoral bifurcations are well below the inferior margins of the femoral head bilaterally.  Recommendation: Continue evaluation for aortic valve intervention.  Findings Coronary Findings Diagnostic  Dominance: Right  Left Anterior Descending Mid LAD lesion is 80% stenosed. Dist LAD-1 lesion is 99% stenosed. Dist LAD-2 lesion is 80% stenosed.  First Diagonal Branch 1st Diag lesion is 100% stenosed.  Left Circumflex There is moderate diffuse disease throughout the vessel. Prox Cx to Mid Cx lesion is 80% stenosed. Mid Cx to Dist Cx lesion is 70% stenosed.  Right Coronary Artery There is mild diffuse disease throughout the vessel.  Intervention  No interventions have been documented.   STRESS TESTS  NM PET CT CARDIAC PERFUSION MULTI W/ABSOLUTE BLOODFLOW 06/08/2023  Narrative   Findings are consistent with infarction with peri-infarct ischemia in virtually the entire LAD artery distribution. The study is high risk.   LV perfusion is abnormal. There is evidence of ischemia. There is evidence of infarction. Defect 1: There is a large defect with severe reduction in uptake present in the apical to mid anterior, anterolateral, anteroseptal and apex location(s) that is partially reversible. There is abnormal wall motion in the defect area. Consistent with infarction and peri-infarct ischemia. The defect is  consistent with abnormal perfusion in the LAD territory.   Findings are surprising in a view of the recent normal LV wall motion and overall LV systolic function on the echocardiogram from 04/08/2023, but may reflect an interval anteroapical infarction. Consider repeat echo.   Calculated LV EF is probably unreliable due to the marked R-R interval variability (frequent PACs). Rest left ventricular function is abnormal. There was a single regional abnormality. Rest EF: 33%. Stress left ventricular function is abnormal. There was a single regional abnormality. Stress EF: 15%. End diastolic cavity size is normal. End systolic cavity size is normal.   Myocardial blood flow was computed to be 0.52ml/g/min at rest and 0.64ml/g/min at stress. Global myocardial blood flow reserve was 0.74 and was highly abnormal.  MBFR is 0.74. The inverted myocardial blood flow reserve is a highly unusual finding, possibly triggered by diastolic hypotension in a patient with aortic stenosis -related cardiomyopathy.   Coronary calcium  was present on the attenuation correction CT images. Severe coronary calcifications were present. Coronary calcifications were present in the left anterior descending artery and right coronary artery distribution(s).   Electronically signed by Jerel Balding, MD  CLINICAL DATA:  This over-read does not include interpretation of cardiac or coronary anatomy or pathology. The  Cardiac PET CT interpretation by the cardiologist is attached.  COMPARISON:  None Available.  FINDINGS: Cardiovascular: Aortic valve calcifications. Aortic atherosclerosis. Normal heart size. Three-vessel coronary artery calcifications. No pericardial effusion.  Limited Mediastinum/Nodes: No enlarged mediastinal, hilar, or axillary lymph nodes. Trachea and esophagus demonstrate no significant findings.  Limited Lungs/Pleura: Lungs are clear. No pleural effusion or pneumothorax.  Upper Abdomen: No acute  abnormality.  Musculoskeletal: No chest wall abnormality. No acute osseous findings.  IMPRESSION: 1. No acute CT findings of the included chest. 2. Coronary artery disease. 3. Aortic valve calcifications. Correlate for echocardiographic evidence of aortic valve dysfunction.  Aortic Atherosclerosis (ICD10-I70.0).   Electronically Signed By: Marolyn JONETTA Jaksch M.D. On: 06/08/2023 16:15   ECHOCARDIOGRAM  ECHOCARDIOGRAM COMPLETE 06/14/2023  Narrative ECHOCARDIOGRAM REPORT    Patient Name:   MICKEY HEBEL Date of Exam: 06/14/2023 Medical Rec #:  [id number]       Height:       61.0 in Accession #:    [id number]      Weight:       186.9 lb Date of Birth:  1945/03/19       BSA:          1.835 m Patient Age:    78 years        BP:           105/62 mmHg Patient Gender: F               HR:           68 bpm. Exam Location:  Inpatient  Procedure: 2D Echo, 3D Echo, Cardiac Doppler and Color Doppler (Both Spectral and Color Flow Doppler were utilized during procedure).  Indications:    Aortic Stenosis l35.0  History:        Patient has prior history of Echocardiogram examinations, most recent 04/08/2023. CHF, NSTEMI, Aortic Valve Disease, Arrythmias:RBBB; Risk Factors:Hypertension, Diabetes and Dyslipidemia.  Sonographer:    Aida Pizza RCS Referring Phys: [phone] PETER M [place]  IMPRESSIONS   1. Left ventricular ejection fraction, by estimation, is 35 to 40%. The left ventricle has moderately decreased function. The left ventricle demonstrates regional wall motion abnormalities severe hypokinesis of the mid-apical anterior wall, the mid-apical anterolateral wall, the apical septal wall, and the true apex. There is mild concentric left ventricular hypertrophy. Left ventricular diastolic parameters are consistent with Grade II diastolic dysfunction (pseudonormalization). 2. Right ventricular systolic function is normal. The right ventricular size is mildly enlarged. There is moderately  elevated pulmonary artery systolic pressure. The estimated right ventricular systolic pressure is 51.6 mmHg. 3. Left atrial size was moderately dilated. 4. Right atrial size was mildly dilated. 5. The mitral valve is degenerative. Mild mitral valve regurgitation. No evidence of mitral stenosis. Moderate mitral annular calcification. 6. The aortic valve is tricuspid. There is severe calcifcation of the aortic valve. Aortic valve regurgitation is not visualized. Severe low flow/low gradient aortic valve stenosis. Aortic valve area, by VTI measures 0.89 cm. Aortic valve mean gradient measures 26.0 mmHg. 7. The inferior vena cava is normal in size with <50% respiratory variability, suggesting right atrial pressure of 8 mmHg.  FINDINGS Left Ventricle: Left ventricular ejection fraction, by estimation, is 35 to 40%. The left ventricle has moderately decreased function. The left ventricle demonstrates regional wall motion abnormalities. The left ventricular internal cavity size was normal in size. There is mild concentric left ventricular hypertrophy. Left ventricular diastolic parameters are consistent with Grade II diastolic dysfunction (pseudonormalization).  Right Ventricle: The  right ventricular size is mildly enlarged. No increase in right ventricular wall thickness. Right ventricular systolic function is normal. There is moderately elevated pulmonary artery systolic pressure. The tricuspid regurgitant velocity is 3.30 m/s, and with an assumed right atrial pressure of 8 mmHg, the estimated right ventricular systolic pressure is 51.6 mmHg.  Left Atrium: Left atrial size was moderately dilated.  Right Atrium: Right atrial size was mildly dilated.  Pericardium: There is no evidence of pericardial effusion.  Mitral Valve: The mitral valve is degenerative in appearance. There is mild calcification of the mitral valve leaflet(s). Moderate mitral annular calcification. Mild mitral valve regurgitation.  No evidence of mitral valve stenosis.  Tricuspid Valve: The tricuspid valve is normal in structure. Tricuspid valve regurgitation is trivial.  Aortic Valve: The aortic valve is tricuspid. There is severe calcifcation of the aortic valve. Aortic valve regurgitation is not visualized. Severe aortic stenosis is present. Aortic valve mean gradient measures 26.0 mmHg. Aortic valve peak gradient measures 38.2 mmHg. Aortic valve area, by VTI measures 0.89 cm.  Pulmonic Valve: The pulmonic valve was normal in structure. Pulmonic valve regurgitation is not visualized.  Venous: The inferior vena cava is normal in size with less than 50% respiratory variability, suggesting right atrial pressure of 8 mmHg.  IAS/Shunts: No atrial level shunt detected by color flow Doppler.   LEFT VENTRICLE PLAX 2D LVIDd:         5.30 cm   Diastology LVIDs:         3.60 cm   LV e' medial:    4.46 cm/s LV PW:         1.10 cm   LV E/e' medial:  30.9 LV IVS:        1.20 cm   LV e' lateral:   6.31 cm/s LVOT diam:     2.00 cm   LV E/e' lateral: 21.9 LV SV:         69 LV SV Index:   38 LVOT Area:     3.14 cm  3D Volume EF: 3D EF:        56 % LV EDV:       155 ml LV ESV:       69 ml LV SV:        86 ml  RIGHT VENTRICLE RV S prime:     11.50 cm/s TAPSE (M-mode): 2.6 cm  LEFT ATRIUM             Index        RIGHT ATRIUM           Index LA diam:        4.00 cm 2.18 cm/m   RA Area:     18.50 cm LA Vol (A2C):   90.8 ml 49.48 ml/m  RA Volume:   58.60 ml  31.93 ml/m LA Vol (A4C):   80.7 ml 43.97 ml/m LA Biplane Vol: 85.5 ml 46.59 ml/m AORTIC VALVE AV Area (Vmax):    0.85 cm AV Area (Vmean):   0.81 cm AV Area (VTI):     0.89 cm AV Vmax:           309.00 cm/s AV Vmean:          226.667 cm/s AV VTI:            0.777 m AV Peak Grad:      38.2 mmHg AV Mean Grad:      26.0 mmHg LVOT Vmax:  83.50 cm/s LVOT Vmean:        58.300 cm/s LVOT VTI:          0.221 m LVOT/AV VTI ratio: 0.28  AORTA Ao  Root diam: 3.40 cm  MITRAL VALVE                TRICUSPID VALVE MV Area (PHT): 4.60 cm     TR Peak grad:   43.6 mmHg MV Decel Time: 165 msec     TR Vmax:        330.00 cm/s MV E velocity: 138.00 cm/s MV A velocity: 83.30 cm/s   SHUNTS MV E/A ratio:  1.66         Systemic VTI:  0.22 m Systemic Diam: 2.00 cm  Dalton McleanMD Electronically signed by Ezra Kanner Signature Date/Time: 06/14/2023/3:58:40 PM    Final    MONITORS  LONG TERM MONITOR (3-14 DAYS) 04/08/2023  Narrative Patch Wear Time:  10 days and 14 hours (2025-03-01T20:42:26-0500 to 2025-03-12T11:46:50-0400)  Patient had a min HR of 30 bpm, max HR of 90 bpm, and avg HR of 56 bpm. Predominant underlying rhythm was Sinus Rhythm. First Degree AV Block was present.  EVENTS: -46 Pauses occurred, the longest lasting 4.7 secs (13 bpm).  -Isolated SVEs were occasional (1.1%, 5772), and no SVE Couplets or SVE Triplets were present.  -Isolated VEs were rare (<1.0%), and no VE Couplets or VE Triplets were present.  No atrial fibrillation or sustained ventricular tachyarrhythmias were detected.  SUMMARY:  Pauses during early waking hours (7am to 9am), will refer to [facility] for further recommendations.       ______________________________________________________________________________________________      Laboratory Data:  High Sensitivity Troponin:   Recent Labs  Lab 07/22/23 1352 07/22/23 1600  TROPONINIHS 41* 40*     Chemistry Recent Labs  Lab 07/24/23 0331 07/25/23 0257 07/26/23 0255  NA 137 134* 138  K 3.5 3.7 3.9  CL 100 100 103  CO2 24 22 25   GLUCOSE 154* 282* 85  BUN 20 33* 41*  CREATININE 2.72* 3.97* 4.33*  CALCIUM  9.0 8.8* 9.0  GFRNONAA 17* 11* 10*  ANIONGAP 13 12 10     Recent Labs  Lab 07/22/23 2305  ALBUMIN  3.2*   Hematology Recent Labs  Lab 07/23/23 0910 07/26/23 0255 07/26/23 0818  WBC 5.5 6.9 6.1  RBC 3.45* 3.65* 3.56*  HGB 9.8* 10.3* 9.9*  HCT 31.0* 33.0* 31.9*  MCV 89.9  90.4 89.6  MCH 28.4 28.2 27.8  MCHC 31.6 31.2 31.0  RDW 16.6* 16.0* 16.0*  PLT 262 273 260   BNP Recent Labs  Lab 07/22/23 1456  BNP 1,346.7*    DDimer No results for input(s): DDIMER in the last 168 hours.   Radiology/Studies:  CARDIAC CATHETERIZATION Result Date: 07/23/2023 Sinus pauses Successful placement of transvenous pacing wire from the left internal jugular vein     STS Risk Calculator: Procedure Type: Isolated AVR Perioperative Outcome Estimate % Operative Mortality 20.5% Morbidity & Mortality 43.3% Stroke 2.23% Renal Failure NA Reoperation 8.2% Prolonged Ventilation 21.1% Deep Sternal Wound Infection 0.066% Long Hospital Stay (>14 days) 20.2% Short Hospital Stay (<6 days)* 12.3%   Assessment and Plan:   Rhonda Clayton is a 78 y.o. female with symptoms of severe, stage D2 aortic stenosis with NYHA Class II symptoms who is currently admitted for symptomatic sinus pauses requiring a leadless pacemaker. Given concerns about her risk of general anesthesia required for leadless pacemaker insertion, it was decided to address severe LFLG AS  first.   Echo 06/14/23 with LVEF of 35-40%, hypokinesis of the mid-apical, anterior, anterolateral and apical wall, G2DD, mod MAC with mild MR and severe LFLG AS with mean gradient 26 mm hg, AVA 0.89 cm2, DVI 0.28 and SVI 38.   LHC 07/14/23 showed diffuse coronary artery disease of the mid to distal LAD, diagonal, and left circumflex systems. The lesions were relatively distal and not felt to explain the patient's cardiomyopathy. Given the lack of chest pain, plan was for medical management.   Cardiac gated CTA of the heart reveals anatomical characteristics consistent with aortic stenosis suitable for treatment by transcatheter aortic valve replacement without any significant complicating features and CTA of the aorta and iliac vessels demonstrate what appear to be adequate pelvic vascular access to facilitate a transfemoral  approach.     I have reviewed the natural history of aortic stenosis with the patient. We have discussed the limitations of medical therapy and the poor prognosis associated with symptomatic aortic stenosis. We have reviewed potential treatment options, including palliative medical therapy, conventional surgical aortic valve replacement, and transcatheter aortic valve replacement. We discussed treatment options in the context of this patient's specific comorbid medical conditions.    The patient's predicted risk of mortality with conventional aortic valve replacement is 20.5% primarily based on age, LV dysfunction, CHF, ESRD on HD, sinus pauses, DMT2, obesity.   Plan for TAVR with a 26mm Edwards S3 via RTF tomorrow 07/27/23 with Dr. Verlin and Dr. Lucas followed by staged leadless Pacemaker with Dr. Nancey.     Signed, Lamarr Hummer, PA-C  07/26/2023 4:15 PM

## 2023-07-26 NOTE — Plan of Care (Signed)

## 2023-07-26 NOTE — Progress Notes (Signed)
 Nephrology Follow-Up Consult note   Assessment/Recommendations: Rhonda Clayton is a/an 78 y.o. female with a past medical history significant for ESRD, admitted for symptomatic bradycardia.       OP HD: [facility] M,T,Th,S 3 hrs 180NRe 350/Autoflow 1.5 80 kg 2.0 K/ 2.0 Ca TDC - Heparin  2000 units IV three times per week - Mircera 75 mcg IV q 2 weeks (last dose 07/13/2023 next dose due 07/21/2023-did not receive) - Venofer  50 mg IV q week   Assessment/Plan:  Symptomatic Bradycardia: Temporary pacer in now.  Plan for pacemaker per cardiology when able.  Severe AS: possible TAVR tomorrow, CT scans today  ESRD -  Will have HD MWF while in hospital.  Continue on current schedule.   Hypertension/volume  - BP and volume controlled. Continue Furosemide  on non HD days. Small UF as tolerated.    Anemia  -ESA was given on 7/4  Metabolic bone disease -  BMD labs at goal. No binders/ VDRA yet  Nutrition - K+ on low side. Regular diet with fluid restrictions.    [name] [name] [facility] 07/26/2023 2:35 PM  ___________________________________________________________  CC: Bradycardia  Interval History/Subjective: Feels better today. Breathing well. No complaints.   Medications:  Current Facility-Administered Medications  Medication Dose Route Frequency Provider Last Rate Last Admin   0.9 %  sodium chloride  infusion  250 mL Intravenous PRN Inocencio Soyla Lunger, MD 10 mL/hr at 07/26/23 1300 Infusion Verify at 07/26/23 1300   acetaminophen  (TYLENOL ) tablet 650 mg  650 mg Oral Q6H PRN Verlin Lonni JONETTA, MD       Or   acetaminophen  (TYLENOL ) suppository 650 mg  650 mg Rectal Q6H PRN Verlin Lonni JONETTA, MD       alteplase  (CATHFLO ACTIVASE ) injection 2 mg  2 mg Intracatheter Once PRN Verlin Lonni JONETTA, MD       anticoagulant sodium citrate  solution 5 mL  5 mL Intracatheter PRN Verlin Lonni JONETTA, MD       aspirin  EC tablet 81 mg  81 mg Oral Daily McLean,  Dalton S, MD   81 mg at 07/26/23 0900   atorvastatin  (LIPITOR) tablet 40 mg  40 mg Oral Daily McLean, Dalton S, MD   40 mg at 07/26/23 0900   Chlorhexidine  Gluconate Cloth 2 % PADS 6 each  6 each Topical Daily Inocencio Soyla Lunger, MD   6 each at 07/26/23 9093   Darbepoetin Alfa  (ARANESP ) injection 40 mcg  40 mcg Subcutaneous Q Fri-1800 McAlhany, Christopher D, MD   40 mcg at 07/23/23 1830   ezetimibe  (ZETIA ) tablet 10 mg  10 mg Oral QHS Verlin Lonni JONETTA, MD   10 mg at 07/25/23 2132   feeding supplement (NEPRO CARB STEADY) liquid 237 mL  237 mL Oral PRN Verlin Lonni JONETTA, MD       furosemide  (LASIX ) tablet 40 mg  40 mg Oral Once per day on Sunday Tuesday Thursday Saturday Verlin Lonni JONETTA, MD   40 mg at 07/25/23 0902   heparin  injection 1,000 Units  1,000 Units Intracatheter PRN Verlin Lonni JONETTA, MD       heparin  injection 1,600 Units  20 Units/kg Dialysis PRN Verlin Lonni JONETTA, MD       heparin  injection 2,000 Units  2,000 Units Dialysis PRN Verlin Lonni JONETTA, MD       heparin  injection 5,000 Units  5,000 Units Subcutaneous Q8H Verlin Lonni JONETTA, MD   5,000 Units at 07/26/23 1325   hydrALAZINE  (APRESOLINE ) injection 10 mg  10 mg Intravenous Q4H PRN Verlin Lonni BIRCH, MD       insulin  aspart (novoLOG ) injection 0-15 Units  0-15 Units Subcutaneous TID WC McLean, Dalton S, MD   8 Units at 07/26/23 1134   insulin  aspart (novoLOG ) injection 0-5 Units  0-5 Units Subcutaneous QHS Rolan Ezra RAMAN, MD   3 Units at 07/25/23 2133   ipratropium-albuterol  (DUONEB) 0.5-2.5 (3) MG/3ML nebulizer solution 3 mL  3 mL Nebulization Q4H PRN Verlin Lonni BIRCH, MD       levothyroxine  (SYNTHROID ) tablet 100 mcg  100 mcg Oral Once per day on Sunday Tuesday Wednesday Thursday Saturday Verlin Lonni BIRCH, MD   100 mcg at 07/25/23 9443   And   levothyroxine  (SYNTHROID ) tablet 50 mcg  50 mcg Oral Once per day on Monday Friday Verlin Lonni BIRCH, MD   50 mcg at  07/26/23 9374   lidocaine  (PF) (XYLOCAINE ) 1 % injection 5 mL  5 mL Intradermal PRN Verlin Lonni BIRCH, MD       lidocaine -prilocaine  (EMLA ) cream 1 Application  1 Application Topical PRN Verlin Lonni BIRCH, MD       lip balm (CARMEX) ointment   Topical PRN Inocencio Soyla Lunger, MD       midodrine  (PROAMATINE ) tablet 5 mg  5 mg Oral Q M,W,F-HD Bensimhon, Toribio SAUNDERS, MD   5 mg at 07/26/23 1134   Oral care mouth rinse  15 mL Mouth Rinse PRN Camnitz, Will Lunger, MD       pentafluoroprop-tetrafluoroeth (GEBAUERS) aerosol 1 Application  1 Application Topical PRN Verlin Lonni BIRCH, MD       polyethylene glycol (MIRALAX  / GLYCOLAX ) packet 17 g  17 g Oral Daily Rolan Ezra RAMAN, MD   17 g at 07/24/23 9052   senna-docusate (Senokot-S) tablet 1 tablet  1 tablet Oral QHS Rolan Ezra RAMAN, MD   1 tablet at 07/25/23 2132   sodium chloride  flush (NS) 0.9 % injection 3 mL  3 mL Intravenous Q12H Verlin Lonni BIRCH, MD   3 mL at 07/26/23 9093   sodium chloride  flush (NS) 0.9 % injection 3 mL  3 mL Intravenous PRN Verlin Lonni BIRCH, MD          Review of Systems: 10 systems reviewed and negative except per interval history/subjective  Physical Exam: Vitals:   07/26/23 1200 07/26/23 1300  BP: 125/64 (!) 130/47  Pulse: (!) 42 (!) 47  Resp: 19 15  Temp:    SpO2: 99% 99%   Total I/O In: 549.9 [P.O.:480; I.V.:69.9] Out: -   Intake/Output Summary (Last 24 hours) at 07/26/2023 1435 Last data filed at 07/26/2023 1300 Gross per 24 hour  Intake 1049.84 ml  Output 800 ml  Net 249.84 ml   Constitutional: Tired-appearing, no acute distress ENMT: ears and nose without scars or lesions, MMM CV: normal rate (paced), no edema Respiratory: bilateral chest rise, normal work of breathing Gastrointestinal: soft, non-tender, no palpable masses or hernias Skin: no visible lesions or rashes Psych: alert, judgement/insight appropriate, appropriate mood and affect   Test Results I personally  reviewed new and old clinical labs and radiology tests Lab Results  Component Value Date   NA 138 07/26/2023   K 3.9 07/26/2023   CL 103 07/26/2023   CO2 25 07/26/2023   BUN 41 (H) 07/26/2023   CREATININE 4.33 (H) 07/26/2023   CALCIUM  9.0 07/26/2023   ALBUMIN  3.2 (L) 07/22/2023   PHOS 2.9 07/24/2023    CBC Recent Labs  Lab 07/22/23 1352 07/22/23  2305 07/23/23 0910 07/26/23 0255 07/26/23 0818  WBC 5.7   < > 5.5 6.9 6.1  NEUTROABS 2.9  --   --   --   --   HGB 10.4*   < > 9.8* 10.3* 9.9*  HCT 34.7*   < > 31.0* 33.0* 31.9*  MCV 93.0   < > 89.9 90.4 89.6  PLT 255   < > 262 273 260   < > = values in this interval not displayed.

## 2023-07-26 NOTE — Progress Notes (Addendum)
 [name], female   DOB: Apr 08, 1945, 78 y.o.   MRN: [id number]     Advanced Heart Failure Rounding Note  Cardiologist: Lurena MARLA Red, MD  Chief Complaint: Syncope Subjective:    C/w intermittent pacing, currently NSR 45 bpm. Back-up rate set at 40 bpm. BP stable, SBPs 120s.   Sitting up in bed. Feels ok. Denies CP. No dyspnea. No further dizziness.     Objective:   Weight Range: 76.8 kg Body mass index is 31.99 kg/m.   Vital Signs:   Temp:  [97.5 F (36.4 C)-97.8 F (36.6 C)] 97.8 F (36.6 C) (07/07 0600) Pulse Rate:  [41-65] 45 (07/07 0800) Resp:  [7-19] 17 (07/07 0800) BP: (99-149)/(39-89) 124/56 (07/07 0800) SpO2:  [89 %-100 %] 89 % (07/07 0800) Weight:  [76.8 kg] 76.8 kg (07/07 0500) Last BM Date : 07/25/23  Weight change: Filed Weights   07/24/23 0248 07/26/23 0500  Weight: 97.7 kg 76.8 kg    Intake/Output:   Intake/Output Summary (Last 24 hours) at 07/26/2023 0833 Last data filed at 07/26/2023 0600 Gross per 24 hour  Intake 679.86 ml  Output 800 ml  Net -120.14 ml      Physical Exam    General:  Well appearing, elderly F. No respiratory difficulty HEENT: normal Neck: supple. JVD not elevated. + left internal jugular TVP  Cor: PMI nondisplaced. Regular rate & rhythm. 2/6 AS murmur RUSB  Lungs: clear Abdomen: soft, nontender, nondistended. Extremities: no cyanosis, clubbing, rash, edema Neuro: alert & oriented x 3, cranial nerves grossly intact. moves all 4 extremities w/o difficulty. Affect pleasant.  Telemetry   NSR 45 bpm, intermittent v-pacing (personally reviewed)  Labs    CBC Recent Labs    07/23/23 0910 07/26/23 0255  WBC 5.5 6.9  HGB 9.8* 10.3*  HCT 31.0* 33.0*  MCV 89.9 90.4  PLT 262 273   Basic Metabolic Panel Recent Labs    92/94/74 0331 07/25/23 0257 07/26/23 0255  NA 137 134* 138  K 3.5 3.7 3.9  CL 100 100 103  CO2 24 22 25   GLUCOSE 154* 282* 85  BUN 20 33* 41*  CREATININE 2.72* 3.97* 4.33*   CALCIUM  9.0 8.8* 9.0  MG 1.8 1.9 1.9  PHOS 2.9  --   --    Liver Function Tests No results for input(s): AST, ALT, ALKPHOS, BILITOT, PROT, ALBUMIN  in the last 72 hours.  No results for input(s): LIPASE, AMYLASE in the last 72 hours. Cardiac Enzymes No results for input(s): CKTOTAL, CKMB, CKMBINDEX, TROPONINI in the last 72 hours.  BNP: BNP (last 3 results) Recent Labs    01/03/23 0925 06/12/23 1945 07/22/23 1456  BNP 709.1* 1,627.4* 1,346.7*    ProBNP (last 3 results) No results for input(s): PROBNP in the last 8760 hours.   D-Dimer No results for input(s): DDIMER in the last 72 hours. Hemoglobin A1C No results for input(s): HGBA1C in the last 72 hours. Fasting Lipid Panel No results for input(s): CHOL, HDL, LDLCALC, TRIG, CHOLHDL, LDLDIRECT in the last 72 hours. Thyroid  Function Tests No results for input(s): TSH, T4TOTAL, T3FREE, THYROIDAB in the last 72 hours.  Invalid input(s): FREET3   Other results:   Imaging    No results found.    Medications:     Scheduled Medications:  aspirin  EC  81 mg Oral Daily   atorvastatin   40 mg Oral Daily   Chlorhexidine  Gluconate Cloth  6 each Topical Daily   darbepoetin (ARANESP ) injection - DIALYSIS  40  mcg Subcutaneous Q Fri-1800   ezetimibe   10 mg Oral QHS   furosemide   40 mg Oral Once per day on Sunday Tuesday Thursday Saturday   heparin   2,000 Units Dialysis Once in dialysis   heparin   5,000 Units Subcutaneous Q8H   insulin  aspart  0-15 Units Subcutaneous TID WC   insulin  aspart  0-5 Units Subcutaneous QHS   levothyroxine   100 mcg Oral Once per day on Sunday Tuesday Wednesday Thursday Saturday   And   levothyroxine   50 mcg Oral Once per day on Monday Friday   polyethylene glycol  17 g Oral Daily   senna-docusate  1 tablet Oral QHS   sodium chloride  flush  3 mL Intravenous Q12H    Infusions:  sodium chloride  10 mL/hr at 07/26/23 0600   anticoagulant  sodium citrate       PRN Medications: sodium chloride , acetaminophen  **OR** acetaminophen , alteplase , anticoagulant sodium citrate , feeding supplement (NEPRO CARB STEADY), heparin , heparin , heparin , hydrALAZINE , ipratropium-albuterol , lidocaine  (PF), lidocaine -prilocaine , lip balm, mouth rinse, pentafluoroprop-tetrafluoroeth, sodium chloride  flush    Assessment/Plan   1. Bradycardia: History of trifascicular block.  Admitted with syncopal episodes and found to have long symptomatic sinus pauses.  TTVP placed.  - c/w intermittent pacing - Maintain TTVP, backup set 40 bpm.  - She will need permanent pacemaker. Given ESRD Dr. Nancey has been planning on leadless device.  Suspect TAVR first with TTVP in place then PPM.  - EP following.  2. ESRD: Recently started HD.   - Nephrology following. She does not appear grossly volume overloaded. HD schedule per nephrology   3. CAD: Diffuse CAD noted on 6/25 LHC with 80% mid and 99% distal LAD stenosis, TO D1, 80% mLCx with 70% distal LCx.  No intervention given diffuse mid to distal LAD and LCx disease and no ACS.  She denies chest pain.  Plan to continue medical management for now.  - Continue ASA 81 mg daily  - Continue Zetia  10 mg daily  - Atorvastatin  40 mg added this admit.  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  She does not want to take a medication that involves injections either.  4. Aortic stenosis: Low flow/low gradient severe AS. Echo in 5/25 with EF 35-40%, peri-apical severe hypokinesis, AVA 0.89 cm^2 with mean gradient 26 mmHg. She has been undergoing TAVR workup.  - She needs her TAVR CTs - Will need TAVR then pacemaker placement. Will ask structural heart team to see today   5. Chronic systolic CHF:  Echo in 5/25 with EF 35-40%, peri-apical severe hypokinesis, low flow/low gradient severe AS with AVA 0.89 cm^2 with mean gradient 26 mmHg.  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   - Volume controlled by iHD - GDMT limited by ESRD - no ? blocker w/ sinus pauses    CRITICAL CARE Performed by: Caffie Shed   Total critical care time: 10 minutes  Critical care time was exclusive of separately billable procedures and treating other patients.  Critical care was necessary to treat or prevent imminent or life-threatening deterioration.  Critical care was time spent personally by me on the following activities: development of treatment plan with patient and/or surrogate as well as nursing, discussions with consultants, evaluation of patient's response to treatment, examination of patient, obtaining history from patient or surrogate, ordering and performing treatments and interventions, ordering and  review of laboratory studies, ordering and review of radiographic studies, pulse oximetry and re-evaluation of patient's condition.    Length of Stay: [address] [place]  07/26/2023, 8:33 AM  Advanced Heart Failure Team Pager [phone] (M-F; 7a - 5p)  Please contact [facility] for night-coverage after hours (5p -7a ) and weekends on [email or web address]  Agree with above.   Remains with intermittent v-pacing TVP.   BP running low. Denies CP or SOB.   General:  Sitting up in bed  No resp difficulty HEENT: normal Neck: supple.JVP 8-9 Cor: [place] regular. 2/6 SEM RSB s2 obliterated   + tunneled HD cath Lungs: clear Abdomen: soft, nontender, nondistended. No hepatosplenomegaly. No bruits or masses. Good bowel sounds. Extremities: no cyanosis, clubbing, rash, edema Neuro: alert & orientedx3, cranial nerves grossly intact. moves all 4 extremities w/o difficulty. Affect pleasant  Case d/w EP and Structural. Echo c/w LFLG AS. EF 40%. Will plan TAVR CTs today and plan TAVR tomorrow if anatomy favorable followed by leadless pacer.   BP soft. Will add midodrine  on HD  days for now.   CRITICAL CARE Performed by: Cherrie Sieving  Total critical care time: 45 minutes  Critical care time was exclusive of separately billable procedures and treating other patients.  Critical care was necessary to treat or prevent imminent or life-threatening deterioration.  Critical care was time spent personally by me (independent of midlevel providers or residents) on the following activities: development of treatment plan with patient and/or surrogate as well as nursing, discussions with consultants, evaluation of patient's response to treatment, examination of patient, obtaining history from patient or surrogate, ordering and performing treatments and interventions, ordering and review of laboratory studies, ordering and review of radiographic studies, pulse oximetry and re-evaluation of patient's condition.  Sieving Cherrie, MD  3:25 PM

## 2023-07-26 NOTE — Progress Notes (Signed)
 Structural Heart Note:   Progress Note  Patient Name: Rhonda Clayton Date of Encounter: 07/26/2023 [facility] Cardiologist: Arun K Thukkani, MD   Interval Summary   Patient stable morning with intermittent pacing, currently in sinus rhythm.  No chest pain or shortness of breath.  Vital Signs Vitals:   07/26/23 1000 07/26/23 1100 07/26/23 1107 07/26/23 1135  BP: (!) 114/42 (!) 104/38 (!) 97/44   Pulse: (!) 41 (!) 42 (!) 46 (!) 42  Resp: 12 (!) 5 12 15   Temp:    (!) 97.5 F (36.4 C)  TempSrc:    Oral  SpO2: 98% 95% 92% 100%  Weight:        Intake/Output Summary (Last 24 hours) at 07/26/2023 1231 Last data filed at 07/26/2023 1100 Gross per 24 hour  Intake 1049.85 ml  Output 800 ml  Net 249.85 ml      07/26/2023    5:00 AM 07/24/2023    2:48 AM 07/14/2023   10:07 AM  Last 3 Weights  Weight (lbs) 169 lb 5 oz 215 lb 6.2 oz 173 lb  Weight (kg) 76.8 kg 97.7 kg 78.472 kg      Telemetry/ECG  Sinus bradycardia, demand pacing - Personally Reviewed  Physical Exam  GEN: No acute distress.   Neck: No JVD Cardiac: RRR, distant heart sounds with 3/6 harsh crescendo decrescendo murmur at the right upper sternal border Respiratory: Clear to auscultation bilaterally. GI: Soft, nontender, non-distended  MS: No edema  Assessment & Plan  1.  Severe low-flow low gradient aortic stenosis with LVEF 35 to 40%, calculated aortic valve area 0.89 cm 2.  Symptomatic bradycardia with syncope on background of trifascicular block: Patient with advanced conduction disease.  EP team following.  Plans noted for leadless pacemaker.  Currently stable with temporary transvenous pacing. 3.  End-stage renal disease: Patient tolerating hemodialysis 4.  Chronic systolic heart failure: Volume management with dialysis.  GDMT limited by end-stage renal disease and advanced conduction disease.  Recommendations: Discussed with the EP team.  Patient meets indication for both transcatheter aortic valve  replacement and permanent pacing.  We have concerns about her risk of general anesthesia required for leadless pacemaker insertion.  Favor moving forward with TAVR first, followed by leadless pacemaker placement.  Her cardiac catheterization films have been reviewed and they demonstrate multivessel coronary artery disease with diffuse disease and no targets for intervention.  The circumflex, intermediate branch, and LAD/diagonal are all small vessels with significant obstructive disease.  The RCA is patent without high-grade stenosis.  I agree with medical therapy for her CAD as indicated.  Her 2D echo images are personally reviewed and show severe calcification and restriction of the aortic valve leaflets with criteria met for severe low-flow low gradient aortic stenosis in the setting of reduced ejection fraction.  Will proceed with CTA studies of the heart as well as the chest, abdomen, and pelvis for TAVR planning.  Will request inpatient cardiac surgical consultation as part of multidisciplinary evaluation.  As long as her anatomy is amenable, we will try to get her added on for TAVR tomorrow.  I spoke with the patient's daughter over the telephone.  I reviewed the outpatient notes of Dr. Wendel as well as her cardiac studies.  Further plans pending her CTA study review.  Medical Readiness Date: 07/27/2023    For questions or updates, please contact [facility] Please consult [email or web address] for contact info under       Signed, Ozell Fell, MD

## 2023-07-26 NOTE — TOC Initial Note (Signed)
 Transition of Care [facility]) - Initial/Assessment Note    Patient Details  Name: Rhonda Clayton MRN: [id number] Date of Birth: November 24, 1945  Transition of Care [facility]) CM/SW Contact:    Sudie Erminio Deems, RN Phone Number: 07/26/2023, 4:10 PM  Clinical Narrative: Patient presented for symptomatic bradycardia-temporary pacemaker in place. Per notes, Possible TAVR. Patient has a hx ESRD-outpatient schedule MTTF. PTA patient was from home alone and she has support of daughter that is in town visiting. Son has returned to [place] . Patient is currently active with [facility] PT/OT. Case Manager will continue to follow for transition of care needs as the patient progresses.    Expected Discharge Plan: Home w Home Health Services Barriers to Discharge: Continued Medical Work up   Patient Goals and CMS Choice Patient states their goals for this hospitalization and ongoing recovery are:: wants to return home once stable.   Choice offered to / list presented to : NA      Expected Discharge Plan and Services In-house Referral: NA Discharge Planning Services: CM Consult Post Acute Care Choice: Home Health, Resumption of Svcs/PTA Provider Living arrangements for the past 2 months: Single Family Home                   DME Agency: NA       HH Arranged: PT, OT HH Agency: [facility]        Prior Living Arrangements/Services Living arrangements for the past 2 months: Single Family Home Lives with:: Self (has support of daughter that is visiting.) Patient language and need for interpreter reviewed:: Yes Do you feel safe going back to the place where you live?: Yes      Need for Family Participation in Patient Care: Yes (Comment) Care giver support system in place?: No (comment)   Criminal Activity/Legal Involvement Pertinent to Current Situation/Hospitalization: No - Comment as needed  Activities of Daily Living   ADL Screening (condition at time of  admission) Independently performs ADLs?: Yes (appropriate for developmental age) Is the patient deaf or have difficulty hearing?: No Does the patient have difficulty seeing, even when wearing glasses/contacts?: No Does the patient have difficulty concentrating, remembering, or making decisions?: No  Permission Sought/Granted Permission sought to share information with : Case Manager, Family Supports     Emotional Assessment Appearance:: Appears stated age Attitude/Demeanor/Rapport: Engaged Affect (typically observed): Appropriate Orientation: : Oriented to Self, Oriented to Place, Oriented to  Time, Oriented to Situation Alcohol  / Substance Use: Not Applicable Psych Involvement: No (comment)  Admission diagnosis:  Sinus pause [I45.5] Symptomatic sinus bradycardia [R00.1] Near syncope [R55] Sick sinus syndrome (HCC) [I49.5] Patient Active Problem List   Diagnosis Date Noted   Sick sinus syndrome (HCC) 07/23/2023   Symptomatic sinus bradycardia 07/22/2023   ESRD on dialysis (HCC) 07/22/2023   Malnutrition of moderate degree 06/19/2023   SOB (shortness of breath) 06/17/2023   Acute on chronic systolic CHF (congestive heart failure), NYHA class 3 (HCC) 06/15/2023   Nonrheumatic aortic (valve) stenosis 06/15/2023   NSTEMI (non-ST elevated myocardial infarction) (HCC) 06/13/2023   Acute congestive heart failure (HCC) 06/12/2023   Hyperlipidemia    Acute respiratory failure with hypoxia (HCC) 01/05/2023   Multifocal pneumonia 01/03/2023   Prolonged QT interval 01/03/2023   CKD (chronic kidney disease), stage IV (HCC) 01/03/2023   Anemia 01/03/2023   CHF with left ventricular diastolic dysfunction, NYHA class 1 (HCC) 01/03/2023   Macular pucker, 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   Stable treated proliferative diabetic retinopathy  of left eye determined by examination associated with type 2 diabetes mellitus (HCC) 05/25/2019   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 [place] [address] Phone: [phone] Fax: [phone]  DARRYLE LONG - [facility] [address] [place] [address] Phone: [phone] Fax: [phone]  [facility] [address] [place] [address] Phone: [phone] Fax: [phone]     Social Drivers of Health (SDOH) Social History: SDOH Screenings   Food Insecurity: No Food Insecurity (07/23/2023)  Housing: Low Risk  (07/23/2023)  Transportation Needs: No Transportation Needs (07/23/2023)  Utilities: Not At Risk (07/23/2023)  Depression (PHQ2-9): Low Risk  (06/10/2023)  Social Connections: Patient Declined (07/23/2023)  Tobacco Use: Low Risk  (07/23/2023)   SDOH Interventions:     Readmission Risk Interventions    07/26/2023    4:09 PM  Readmission Risk Prevention Plan  Transportation Screening Complete  HRI or Home Care Consult Complete  Social Work Consult for Recovery Care Planning/Counseling Complete  Palliative Care Screening Not Applicable  Medication Review Oceanographer) Referral to Pharmacy

## 2023-07-27 ENCOUNTER — Inpatient Hospital Stay (HOSPITAL_COMMUNITY): Admitting: Anesthesiology

## 2023-07-27 ENCOUNTER — Inpatient Hospital Stay (HOSPITAL_COMMUNITY)

## 2023-07-27 ENCOUNTER — Encounter (HOSPITAL_COMMUNITY)
Admission: EM | Disposition: A | Discharge status: Home Health | Payer: Self-pay | Source: Home / Self Care | Attending: Cardiology

## 2023-07-27 ENCOUNTER — Other Ambulatory Visit: Payer: Self-pay | Admitting: Physician Assistant

## 2023-07-27 DIAGNOSIS — Z006 Encounter for examination for normal comparison and control in clinical research program: Secondary | ICD-10-CM

## 2023-07-27 DIAGNOSIS — R001 Bradycardia, unspecified: Secondary | ICD-10-CM | POA: Diagnosis not present

## 2023-07-27 DIAGNOSIS — Z952 Presence of prosthetic heart valve: Secondary | ICD-10-CM

## 2023-07-27 DIAGNOSIS — N184 Chronic kidney disease, stage 4 (severe): Secondary | ICD-10-CM

## 2023-07-27 DIAGNOSIS — I130 Hypertensive heart and chronic kidney disease with heart failure and stage 1 through stage 4 chronic kidney disease, or unspecified chronic kidney disease: Secondary | ICD-10-CM | POA: Diagnosis not present

## 2023-07-27 DIAGNOSIS — I350 Nonrheumatic aortic (valve) stenosis: Secondary | ICD-10-CM

## 2023-07-27 DIAGNOSIS — N186 End stage renal disease: Secondary | ICD-10-CM | POA: Diagnosis not present

## 2023-07-27 DIAGNOSIS — I5022 Chronic systolic (congestive) heart failure: Secondary | ICD-10-CM | POA: Diagnosis not present

## 2023-07-27 DIAGNOSIS — I5023 Acute on chronic systolic (congestive) heart failure: Secondary | ICD-10-CM | POA: Diagnosis not present

## 2023-07-27 HISTORY — PX: INTRAOPERATIVE TRANSTHORACIC ECHOCARDIOGRAM: SHX6523

## 2023-07-27 LAB — ECHOCARDIOGRAM LIMITED
AR max vel: 4.37 cm2
AV Area VTI: 4.6 cm2
AV Area mean vel: 4.05 cm2
AV Mean grad: 1 mmHg
AV Peak grad: 2.4 mmHg
Ao pk vel: 0.78 m/s
Calc EF: 37.7 %
Single Plane A2C EF: 38.7 %
Single Plane A4C EF: 36.6 %
Weight: 2786.61 [oz_av]

## 2023-07-27 LAB — CBC
HCT: 35.6 % — ABNORMAL LOW (ref 36.0–46.0)
Hemoglobin: 11.3 g/dL — ABNORMAL LOW (ref 12.0–15.0)
MCH: 28.4 pg (ref 26.0–34.0)
MCHC: 31.7 g/dL (ref 30.0–36.0)
MCV: 89.4 fL (ref 80.0–100.0)
Platelets: 293 K/uL (ref 150–400)
RBC: 3.98 MIL/uL (ref 3.87–5.11)
RDW: 16.2 % — ABNORMAL HIGH (ref 11.5–15.5)
WBC: 7.3 K/uL (ref 4.0–10.5)
nRBC: 0 % (ref 0.0–0.2)

## 2023-07-27 LAB — GLUCOSE, CAPILLARY
Glucose-Capillary: 180 mg/dL — ABNORMAL HIGH (ref 70–99)
Glucose-Capillary: 227 mg/dL — ABNORMAL HIGH (ref 70–99)
Glucose-Capillary: 220 mg/dL — ABNORMAL HIGH (ref 70–99)
Glucose-Capillary: 176 mg/dL — ABNORMAL HIGH (ref 70–99)
Glucose-Capillary: 191 mg/dL — ABNORMAL HIGH (ref 70–99)
Glucose-Capillary: 183 mg/dL — ABNORMAL HIGH (ref 70–99)

## 2023-07-27 LAB — BASIC METABOLIC PANEL WITH GFR
Anion gap: 11 (ref 5–15)
BUN: 30 mg/dL — ABNORMAL HIGH (ref 8–23)
CO2: 22 mmol/L (ref 22–32)
Calcium: 9.3 mg/dL (ref 8.9–10.3)
Chloride: 101 mmol/L (ref 98–111)
Creatinine, Ser: 3.52 mg/dL — ABNORMAL HIGH (ref 0.44–1.00)
GFR, Estimated: 13 mL/min — ABNORMAL LOW (ref >60)
Glucose, Bld: 206 mg/dL — ABNORMAL HIGH (ref 70–99)
Potassium: 4.6 mmol/L (ref 3.5–5.1)
Sodium: 134 mmol/L — ABNORMAL LOW (ref 135–145)

## 2023-07-27 LAB — POCT ACTIVATED CLOTTING TIME: Activated Clotting Time: 268 s

## 2023-07-27 LAB — MAGNESIUM: Magnesium: 1.9 mg/dL (ref 1.7–2.4)

## 2023-07-27 SURGERY — TRANSCATHETER AORTIC VALVE REPLACEMENT, TRANSFEMORAL (CATHLAB)
Anesthesia: Monitor Anesthesia Care | Laterality: Right

## 2023-07-27 MED ORDER — CHLORHEXIDINE GLUCONATE CLOTH 2 % EX PADS
6.0000 | MEDICATED_PAD | Freq: Every day | CUTANEOUS | Status: DC
  Administered 2023-07-28 – 2023-07-31 (×4): 6 via TOPICAL

## 2023-07-27 MED ORDER — SODIUM CHLORIDE 0.9 % IV SOLN: INTRAVENOUS | Status: AC

## 2023-07-27 MED ORDER — LIDOCAINE HCL (PF) 1 % IJ SOLN
INTRAMUSCULAR | Status: DC | PRN
  Administered 2023-07-27: 20 mL

## 2023-07-27 MED ORDER — OXYCODONE HCL 5 MG PO TABS: 5.0000 mg | ORAL_TABLET | ORAL | Status: DC | PRN

## 2023-07-27 MED ORDER — SODIUM CHLORIDE 0.9 % IV SOLN: INTRAVENOUS | Status: DC | PRN

## 2023-07-27 MED ORDER — PHENYLEPHRINE HCL-NACL 20-0.9 MG/250ML-% IV SOLN
0.0000 ug/min | INTRAVENOUS | Status: DC
  Administered 2023-07-28: 20 ug/min via INTRAVENOUS
  Filled 2023-07-27: qty 250

## 2023-07-27 MED ORDER — NITROGLYCERIN IN D5W 200-5 MCG/ML-% IV SOLN: 0.0000 ug/min | INTRAVENOUS | Status: DC

## 2023-07-27 MED ORDER — SODIUM CHLORIDE 0.9% FLUSH: 3.0000 mL | INTRAVENOUS | Status: DC | PRN

## 2023-07-27 MED ORDER — PROTAMINE SULFATE 10 MG/ML IV SOLN
INTRAVENOUS | Status: DC | PRN
  Administered 2023-07-27: 120 mg via INTRAVENOUS

## 2023-07-27 MED ORDER — SODIUM CHLORIDE 0.9% FLUSH
3.0000 mL | Freq: Two times a day (BID) | INTRAVENOUS | Status: DC
  Administered 2023-07-28 – 2023-07-30 (×7): 3 mL via INTRAVENOUS

## 2023-07-27 MED ORDER — HEPARIN SODIUM (PORCINE) 1000 UNIT/ML IJ SOLN
INTRAMUSCULAR | Status: DC | PRN
Start: 2023-07-27 — End: 2023-07-27
  Administered 2023-07-27: 12000 [IU] via INTRAVENOUS

## 2023-07-27 MED ORDER — CEFAZOLIN SODIUM-DEXTROSE 2-4 GM/100ML-% IV SOLN
2.0000 g | Freq: Three times a day (TID) | INTRAVENOUS | Status: AC
  Administered 2023-07-27 – 2023-07-28 (×2): 2 g via INTRAVENOUS
  Filled 2023-07-27 (×2): qty 100

## 2023-07-27 MED ORDER — PHENYLEPHRINE HCL-NACL 20-0.9 MG/250ML-% IV SOLN
INTRAVENOUS | Status: AC
  Administered 2023-07-27: 20 ug/min via INTRAVENOUS
  Filled 2023-07-27: qty 250

## 2023-07-27 MED ORDER — MORPHINE SULFATE (PF) 2 MG/ML IV SOLN
1.0000 mg | INTRAVENOUS | Status: DC | PRN
  Administered 2023-07-29: 1 mg via INTRAVENOUS
  Filled 2023-07-27: qty 1

## 2023-07-27 MED ORDER — CEFAZOLIN SODIUM-DEXTROSE 2-3 GM-%(50ML) IV SOLR
INTRAVENOUS | Status: DC | PRN
Start: 2023-07-27 — End: 2023-07-27
  Administered 2023-07-27: 2 g via INTRAVENOUS

## 2023-07-27 MED ORDER — SODIUM CHLORIDE 0.9 % IV SOLN: 250.0000 mL | INTRAVENOUS | Status: DC | PRN

## 2023-07-27 MED ORDER — PHENYLEPHRINE 80 MCG/ML (10ML) SYRINGE FOR IV PUSH (FOR BLOOD PRESSURE SUPPORT)
PREFILLED_SYRINGE | INTRAVENOUS | Status: DC | PRN
  Administered 2023-07-27 (×2): 80 ug via INTRAVENOUS

## 2023-07-27 MED ORDER — LIDOCAINE HCL (PF) 1 % IJ SOLN
INTRAMUSCULAR | Status: AC
  Filled 2023-07-27: qty 30

## 2023-07-27 MED ORDER — DEXMEDETOMIDINE HCL IN NACL 200 MCG/50ML IV SOLN
INTRAVENOUS | Status: DC | PRN
  Administered 2023-07-27: .7 ug/kg/h via INTRAVENOUS
  Administered 2023-07-27: 39.52 ug via INTRAVENOUS

## 2023-07-27 MED ORDER — NOREPINEPHRINE 4 MG/250ML-% IV SOLN
INTRAVENOUS | Status: DC | PRN
  Administered 2023-07-27: 2 ug/min via INTRAVENOUS

## 2023-07-27 MED ORDER — IODIXANOL 320 MG/ML IV SOLN
INTRAVENOUS | Status: DC | PRN
  Administered 2023-07-27: 40 mL via INTRA_ARTERIAL

## 2023-07-27 SURGICAL SUPPLY — 26 items
BAG SNAP BAND KOVER 36X36 (MISCELLANEOUS) ×6 IMPLANT
CATH 26 ULTRA DELIVERY (CATHETERS) IMPLANT
CATH DIAG 6FR PIGTAIL ANGLED (CATHETERS) IMPLANT
CATH INFINITI 5FR ANG PIGTAIL (CATHETERS) IMPLANT
CATH INFINITI 6F AL1 (CATHETERS) IMPLANT
CLOSURE MYNX CONTROL 6F/7F (Vascular Products) IMPLANT
CLOSURE PERCLOSE PROSTYLE (VASCULAR PRODUCTS) IMPLANT
CRIMPER (MISCELLANEOUS) IMPLANT
DEVICE INFLATION ATRION QL2530 (MISCELLANEOUS) IMPLANT
KIT MICROPUNCTURE NIT STIFF (SHEATH) IMPLANT
KIT SAPIAN 3 ULTRA RESILIA 26 (Valve) IMPLANT
PACK CARDIAC CATHETERIZATION (CUSTOM PROCEDURE TRAY) ×3 IMPLANT
SET ATX-X65L (MISCELLANEOUS) IMPLANT
SHEATH INTRODUCER SET 20-26 (SHEATH) IMPLANT
SHEATH PINNACLE 5F 10CM (SHEATH) IMPLANT
SHEATH PINNACLE 6F 10CM (SHEATH) IMPLANT
SHEATH PINNACLE 8F 10CM (SHEATH) IMPLANT
SHEATH PROBE COVER 6X72 (BAG) IMPLANT
STOPCOCK MORSE 400PSI 3WAY (MISCELLANEOUS) ×6 IMPLANT
TRANSDUCER W/STOPCOCK (MISCELLANEOUS) IMPLANT
TUBING ART PRESS 72 MALE/FEM (TUBING) IMPLANT
WIRE AMPLATZ SS-J .035X180CM (WIRE) IMPLANT
WIRE EMERALD 3MM-J .035X150CM (WIRE) IMPLANT
WIRE EMERALD 3MM-J .035X260CM (WIRE) IMPLANT
WIRE EMERALD ST .035X260CM (WIRE) IMPLANT
WIRE SAFARI SM CURVE 275 (WIRE) IMPLANT

## 2023-07-27 NOTE — Progress Notes (Signed)
  Reviewed plan with pt.  Likely TAVR today and is on for Dual Chamber Leadless Thursday, 7/10. Dr. Nancey aware.    Questions answered.   Ozell Jodie Passey, PA-C  07/27/2023 7:28 AM

## 2023-07-27 NOTE — Plan of Care (Signed)

## 2023-07-27 NOTE — Progress Notes (Signed)
 Pt receives out-pt HD at TCU at [facility] on [facility] on Mon, Tues, Thurs, Friday. Will assist as needed.   Randine Mungo Renal Navigator [phone]

## 2023-07-27 NOTE — Progress Notes (Signed)
  Echocardiogram 2D Echocardiogram has been performed.  Devora Ellouise SAUNDERS 07/27/2023, 3:58 PM

## 2023-07-27 NOTE — CV Procedure (Signed)
 [facility] V[facility]  TAVR OPERATIVE NOTE   Date of Procedure:  07/27/2023  Preoperative Diagnosis: Severe Aortic Stenosis   Postoperative Diagnosis: Same   Procedure:   Transcatheter Aortic Valve Replacement - Transfemoral Approach  Edwards Sapien 3 Ultra Resilia THV (size 26 mm, model # [id number], serial # [id number] )   Co-Surgeons:  [name], MD and Dorise Fellers , MD   Anesthesiologist:  Erma  Echocardiographer:  Santo  Pre-operative Echo Findings: Severe aortic stenosis Mild left ventricular systolic dysfunction  Post-operative Echo Findings: No paravalvular leak Mild left ventricular systolic dysfunction  BRIEF CLINICAL NOTE AND INDICATIONS FOR SURGERY  78 yo female with severe aortic stenosis.   During the course of the patient's preoperative work up they have been evaluated comprehensively by a multidisciplinary team of specialists coordinated through the Multidisciplinary Heart Valve Clinic in the [facility].  They have been demonstrated to suffer from symptomatic severe aortic stenosis as noted above. The patient has been counseled extensively as to the relative risks and benefits of all options for the treatment of severe aortic stenosis including long term medical therapy, conventional surgery for aortic valve replacement, and transcatheter aortic valve replacement.  The patient has been independently evaluated by Dr. Fellers with CT surgery and they are felt to be at high risk for conventional surgical aortic valve replacement. The surgeon indicated the patient would be a poor candidate for conventional surgery. Based upon review of all of the patient's preoperative diagnostic tests they are felt to be candidate for transcatheter aortic valve replacement using the transfemoral approach as an alternative to high risk conventional surgery.    Following the decision to proceed with transcatheter aortic valve replacement, a  discussion has been held regarding what types of management strategies would be attempted intraoperatively in the event of life-threatening complications, including whether or not the patient would be considered a candidate for the use of cardiopulmonary bypass and/or conversion to open sternotomy for attempted surgical intervention.  The patient has been advised of a variety of complications that might develop peculiar to this approach including but not limited to risks of death, stroke, paravalvular leak, aortic dissection or other major vascular complications, aortic annulus rupture, device embolization, cardiac rupture or perforation, acute myocardial infarction, arrhythmia, heart block or bradycardia requiring permanent pacemaker placement, congestive heart failure, respiratory failure, renal failure, pneumonia, infection, other late complications related to structural valve deterioration or migration, or other complications that might ultimately cause a temporary or permanent loss of functional independence or other long term morbidity.  The patient provides full informed consent for the procedure as described and all questions were answered preoperatively.    DETAILS OF THE OPERATIVE PROCEDURE  PREPARATION:   The patient is brought to the operating room on the above mentioned date and central monitoring was established by the anesthesia team including placement of a radial arterial line. The patient is placed in the supine position on the operating table.  Intravenous antibiotics are administered. Conscious sedation is used.   Baseline transthoracic echocardiogram was performed. The patient's chest, abdomen, both groins, and both lower extremities are prepared and draped in a sterile manner. A time out procedure is performed.   PERIPHERAL ACCESS:   Using the modified Seldinger technique, femoral arterial and venous access were obtained with placement of a 6 Fr sheath in the artery and a 7 Fr  sheath in the vein on the left side using u/s guidance.  A pigtail diagnostic catheter was  passed through the femoral arterial sheath under fluoroscopic guidance into the aortic root.  A temporary transvenous pacemaker catheter was passed through the femoral venous sheath under fluoroscopic guidance into the right ventricle.  The pacemaker was tested to ensure stable lead placement and pacemaker capture. Aortic root angiography was performed in order to determine the optimal angiographic angle for valve deployment.  TRANSFEMORAL ACCESS:  A micropuncture kit was used to gain access to the right femoral artery using u/s guidance. Position confirmed with angiography. Pre-closure with double ProGlide closure devices. The patient was heparinized systemically and ACT verified > 250 seconds.    A 14 Fr transfemoral E-sheath was introduced into the right femoral artery after progressively dilating over an Amplatz superstiff wire. An AL-1 catheter was used to direct a straight-tip exchange length wire across the native aortic valve into the left ventricle. This was exchanged out for a pigtail catheter and position was confirmed in the LV apex. Simultaneous LV and Ao pressures were recorded.  The pigtail catheter was then exchanged for a Safari wire in the LV apex.   TRANSCATHETER HEART VALVE DEPLOYMENT:  An Edwards Sapien 3 THV (size 26 mm) was prepared and crimped per manufacturer's guidelines, and the proper orientation of the valve is confirmed on the [employer] delivery system. The valve was advanced through the introducer sheath using normal technique until in an appropriate position in the abdominal aorta beyond the sheath tip. The balloon was then retracted and using the fine-tuning wheel was centered on the valve. The valve was then advanced across the aortic arch using appropriate flexion of the catheter. The valve was carefully positioned across the aortic valve annulus. The Commander catheter was  retracted using normal technique. Once final position of the valve has been confirmed by angiographic assessment, the valve is deployed while temporarily holding ventilation and during rapid ventricular pacing to maintain systolic blood pressure < 50 mmHg and pulse pressure < 10 mmHg. The balloon inflation is held for >3 seconds after reaching full deployment volume. Once the balloon has fully deflated the balloon is retracted into the ascending aorta and valve function is assessed using TTE. There is felt to be no paravalvular leak and no central aortic insufficiency.  The patient's hemodynamic recovery following valve deployment is good.  The deployment balloon and guidewire are both removed. Echo demostrated acceptable post-procedural gradients, stable mitral valve function, and no AI.   PROCEDURE COMPLETION:  The sheath was then removed and closure devices were completed. Protamine  was administered once femoral arterial repair was complete. The temporary pacemaker, pigtail catheters and femoral sheaths were removed with a Mynx closure device placed in the artery and manual pressure used for venous hemostasis.    The patient tolerated the procedure well and is transported to the surgical intensive care in stable condition. There were no immediate intraoperative complications. All sponge instrument and needle counts are verified correct at completion of the operation.   No blood products were administered during the operation.  The patient received a total of 40 mL of intravenous contrast during the procedure.  LVEDP: 18 mmHg  [name] MD, FACC 07/27/2023 4:28 PM

## 2023-07-27 NOTE — Anesthesia Postprocedure Evaluation (Signed)
 Anesthesia Post Note  Patient: BRENLYN BESHARA  Procedure(s) Performed: Transcatheter Aortic Valve Replacement, Transfemoral (Right) ECHOCARDIOGRAM, TRANSTHORACIC     Patient location during evaluation: PACU Anesthesia Type: MAC Level of consciousness: awake and alert Pain management: pain level controlled Vital Signs Assessment: post-procedure vital signs reviewed and stable Respiratory status: spontaneous breathing, nonlabored ventilation, respiratory function stable and patient connected to nasal cannula oxygen Cardiovascular status: stable and blood pressure returned to baseline Postop Assessment: no apparent nausea or vomiting Anesthetic complications: no   There were no known notable events for this encounter.  Last Vitals:  Vitals:   07/27/23 1603 07/27/23 1608  BP:    Pulse: (!) 55 69  Resp: 20 (!) 25  Temp:  36.5 C  SpO2:      Last Pain:  Vitals:   07/27/23 1608  TempSrc: Oral  PainSc:                  Karys Meckley R Dannis Deroche

## 2023-07-27 NOTE — Anesthesia Procedure Notes (Signed)
 Procedure Name: MAC Date/Time: 07/27/2023 2:32 PM  Performed by: Boyce Shilling, CRNAPre-anesthesia Checklist: Patient identified, Emergency Drugs available, Suction available, Timeout performed and Patient being monitored Patient Re-evaluated:Patient Re-evaluated prior to induction Oxygen Delivery Method: Simple face mask Induction Type: IV induction Dental Injury: Teeth and Oropharynx as per pre-operative assessment

## 2023-07-27 NOTE — Progress Notes (Signed)
 [facility] Progress Note  Subjective:  Seen in room HD overnight, 2 L off  Vitals:   07/27/23 0800 07/27/23 0900 07/27/23 1000 07/27/23 1100  BP: (!) 94/45 (!) 132/46 (!) 108/49 119/70  Pulse: (!) 40 (!) 43 (!) 43 (!) 48  Resp: (!) 5 14 (!) 8 13  Temp:      TempSrc:      SpO2: 97% 96% 99% 97%  Weight:        Exam: Gen: no acute distress CV: normal rate (paced), no edema Respiratory: bilateral chest rise, normal work of breathing Gastrointestinal: soft, non-tender, no palpable masses or hernias Skin: no visible lesions or rashes  TDC in place    OP HD: [facility] MTThSat 3h  B350   80kg  2K  TDC  heparin  2000 - Mircera 75 mcg IV q 2 weeks (last dose 07/13/2023 next dose due 07/21/2023-did not receive) - Venofer  50 mg IV q week    Assessment/Plan: Severe AS: TAVR today Symptomatic bradycardia: Temporary pacer in now.  Plan for pacemaker per cardiology on 07/29/23.  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 Next HD Wed.  HTN: BP controlled, cont furosemide  on non HD days Volume: 1kg under dry wt, euvolemic on exam. Small UF.  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 No binders/ VDRA yet      Myer Fret MD  [facility] 07/27/2023, 11:47 AM  Recent Labs  Lab 07/22/23 2305 07/22/23 2307 07/24/23 0331 07/25/23 0257 07/26/23 0255 07/26/23 0818 07/27/23 0731  HGB 10.1*   < >  --   --  10.3* 9.9* 11.3*  ALBUMIN  3.2*  --   --   --   --   --   --   CALCIUM  8.9   < > 9.0   < > 9.0  --  9.3  PHOS 4.1  --  2.9  --   --   --   --   CREATININE 4.03*   < > 2.72*   < > 4.33*  --  3.52*  K 3.5   < > 3.5   < > 3.9  --  4.6   < > = values in this interval not displayed.   No results for input(s): IRON , TIBC, FERRITIN in the last 168 hours. Inpatient medications:  aspirin  EC  81 mg Oral Daily   atorvastatin   40 mg Oral Daily   Chlorhexidine  Gluconate Cloth  6 each Topical Daily    darbepoetin (ARANESP ) injection - DIALYSIS  40 mcg Subcutaneous Q Fri-1800   ezetimibe   10 mg Oral QHS   furosemide   40 mg Oral Once per day on Sunday Tuesday Thursday Saturday   heparin   5,000 Units Subcutaneous Q8H   insulin  aspart  0-15 Units Subcutaneous TID WC   insulin  aspart  0-5 Units Subcutaneous QHS   levothyroxine   100 mcg Oral Once per day on Sunday Tuesday Wednesday Thursday Saturday   And   levothyroxine   50 mcg Oral Once per day on Monday Friday   midodrine   5 mg Oral Q M,W,F-HD   polyethylene glycol  17 g Oral Daily   senna-docusate  1 tablet Oral QHS   sodium chloride  flush  3 mL Intravenous Q12H    sodium chloride  10 mL/hr at 07/27/23 0500   anticoagulant sodium citrate      sodium chloride , acetaminophen  **OR** acetaminophen , anticoagulant sodium citrate , feeding supplement (  NEPRO CARB STEADY), heparin , heparin , hydrALAZINE , ipratropium-albuterol , lidocaine  (PF), lidocaine -prilocaine , lip balm, mouth rinse, pentafluoroprop-tetrafluoroeth, sodium chloride  flush

## 2023-07-27 NOTE — Op Note (Signed)
 [facility]   MULTIDISCIPLINARY HEART VALVE TEAM   TAVR OPERATIVE NOTE   Date of Procedure:  07/27/2023  Preoperative Diagnosis: Severe Aortic Stenosis   Postoperative Diagnosis: Same   Procedure:   Transcatheter Aortic Valve Replacement - Percutaneous Right Transfemoral Approach  Edwards Sapien 3 Ultra Resilia THV (size 26 mm, model # 9755RSL, serial # [id number])   Co-Surgeons:  Dorise LOIS Fellers, MD and [name], MD   Anesthesiologist:  J. Erma, MD  Echocardiographer:  EMERSON Leavens, MD  Pre-operative Echo Findings: Severe aortic stenosis mild left ventricular systolic dysfunction  Post-operative Echo Findings: No paravalvular leak Mild left ventricular systolic dysfunction   BRIEF CLINICAL NOTE AND INDICATIONS FOR SURGERY  She has stage D2 severe, symptomatic aortic stenosis with a mean gradient of 26 mm Hg and AVA of 0.89 cm2 with EF of 35-40%. LHC shows diffuse 3V coronary disease but she is not having any angina. 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 I agree with need for TAVR. Her gated cardiac CTA and CTA CAP were reviewed and show adequate anatomy for transfemoral TAVR using a 26 mm Sapien 3 valve. I discussed complications that might develop with her and her son on the telephone including but not limited to risks of death, stroke, paravalvular leak, aortic dissection or other major vascular complications, aortic annulus rupture, device embolization, cardiac rupture or perforation, mitral regurgitation, acute myocardial infarction, arrhythmia, heart block or bradycardia requiring permanent pacemaker placement, congestive heart failure, respiratory failure, renal failure, pneumonia, infection, other late complications related to structural valve deterioration or migration, or other complications that might ultimately cause a temporary or permanent loss of functional independence or  other long term morbidity. The patient provides full informed consent for the procedure as described and all questions were answered. She is not a candidate for emergent sternotomy to manage any intraoperative complications.     DETAILS OF THE OPERATIVE PROCEDURE  PREPARATION:    The patient was brought to the operating room on the above mentioned date and appropriate monitoring was established by the anesthesia team. The patient was placed in the supine position on the operating table.  Intravenous antibiotics were administered. The patient was monitored closely throughout the procedure under conscious sedation.   Baseline transthoracic echocardiogram was performed. The patient's abdomen and both groins were prepped and draped in a sterile manner. A time out procedure was performed.   PERIPHERAL ACCESS:    Using the modified Seldinger technique, femoral arterial and venous access was obtained with placement of 6 Fr sheaths on the left side.  A pigtail diagnostic catheter was passed through the left arterial sheath under fluoroscopic guidance into the aortic root.  She already had a temporary transvenous pacemaker catheter in the left internal jugular vein.   The pacemaker was tested to ensure stable lead placement and pacemaker capture. Aortic root angiography was performed in order to determine the optimal angiographic angle for valve deployment.   TRANSFEMORAL ACCESS:   Percutaneous transfemoral access and sheath placement was performed using ultrasound guidance.  The right common femoral artery was cannulated using a micropuncture needle and appropriate location was verified using hand injection angiogram.  A pair of Abbott Perclose percutaneous closure devices were placed and a 6 French sheath replaced into the femoral artery.  The patient was heparinized systemically and ACT verified > 250 seconds.    A 14 Fr transfemoral E-sheath was introduced into the right common femoral artery  after  progressively dilating over an Amplatz superstiff wire. An AL-1 catheter was used to direct a straight-tip exchange length wire across the native aortic valve into the left ventricle. This was exchanged out for a pigtail catheter and position was confirmed in the LV apex. Simultaneous LV and Ao pressures were recorded.  The pigtail catheter was exchanged for a Safari wire in the LV apex.   BALLOON AORTIC VALVULOPLASTY:   Not performed    TRANSCATHETER HEART VALVE DEPLOYMENT:   An Edwards Sapien 3 Ultra transcatheter heart valve (size 26 mm) was prepared and crimped per manufacturer's guidelines, and the proper orientation of the valve is confirmed on the [employer] delivery system. The valve was advanced through the introducer sheath using normal technique until in an appropriate position in the abdominal aorta beyond the sheath tip. The balloon was then retracted and using the fine-tuning wheel was centered on the valve. The valve was then advanced across the aortic arch using appropriate flexion of the catheter. The valve was carefully positioned across the aortic valve annulus. The Commander catheter was retracted using normal technique. Once final position of the valve has been confirmed by angiographic assessment, the valve is deployed during rapid ventricular pacing to maintain systolic blood pressure < 50 mmHg and pulse pressure < 10 mmHg. The balloon inflation is held for >3 seconds after reaching full deployment volume. Once the balloon has fully deflated the balloon is retracted into the ascending aorta and valve function is assessed using echocardiography. There is felt to be no paravalvular leak and no central aortic insufficiency.  The patient's hemodynamic recovery following valve deployment is good.  The deployment balloon and guidewire are both removed.    PROCEDURE COMPLETION:   The sheath was removed and femoral artery closure performed.  Protamine  was administered once femoral  arterial repair was complete. The temporary pacemaker, pigtail catheter and femoral sheaths were removed with manual pressure used for venous hemostasis.  A Mynx femoral closure device was utilized following removal of the diagnostic sheath in the left femoral artery.  The patient tolerated the procedure well and is transported to the cath lab recovery area in stable condition. There were no immediate intraoperative complications. All sponge instrument and needle counts are verified correct at completion of the operation.   No blood products were administered during the operation.  The patient received a total of 40 mL of intravenous contrast during the procedure.   Dorise MARLA Fellers, MD 07/27/2023

## 2023-07-27 NOTE — Transfer of Care (Signed)
 Immediate Anesthesia Transfer of Care Note  Patient: Rhonda Clayton  Procedure(s) Performed: Transcatheter Aortic Valve Replacement, Transfemoral (Right) ECHOCARDIOGRAM, TRANSTHORACIC  Patient Location: SICU  Anesthesia Type:MAC  Level of Consciousness: awake and drowsy  Airway & Oxygen Therapy: Patient Spontanous Breathing and Patient connected to face mask oxygen  Post-op Assessment: Report given to RN and Post -op Vital signs reviewed and stable  Post vital signs: Reviewed and stable  Last Vitals:  Vitals Value Taken Time  BP 128/103 07/27/23 16:24  Temp    Pulse 49 07/27/23 16:26  Resp 17 07/27/23 16:25  SpO2 99 % 07/27/23 16:26  Vitals shown include unfiled device data.  Last Pain:  Vitals:   07/27/23 1603  TempSrc:   PainSc: Asleep      Patients Stated Pain Goal: 0 (07/27/23 1200)  Complications: There were no known notable events for this encounter.

## 2023-07-27 NOTE — Progress Notes (Addendum)
 [name], female   DOB: [birth year], 78 y.o.   MRN: [id number]     Advanced Heart Failure Rounding Note  Cardiologist: Lurena MARLA Red, MD   Chief Complaint: Syncope  Subjective:    Rhythm junctional in 40s, intermittently V paced. Back up rate set to 40 bpm.  Sitting up in bed. No dyspnea, orthopnea or PND. Denies dizziness.   Objective:   Weight Range: 79 kg Body mass index is 32.91 kg/m.   Vital Signs:   Temp:  [97 F (36.1 C)-97.7 F (36.5 C)] 97.4 F (36.3 C) (07/08 0529) Pulse Rate:  [40-55] 40 (07/08 0500) Resp:  [5-21] 20 (07/08 0500) BP: (77-130)/(38-69) 122/46 (07/08 0500) SpO2:  [92 %-100 %] 97 % (07/08 0500) Weight:  [79 kg] 79 kg (07/08 0529) Last BM Date : 07/25/23  Weight change: Filed Weights   07/24/23 0248 07/26/23 0500 07/27/23 0529  Weight: 97.7 kg 76.8 kg 79 kg    Intake/Output:   Intake/Output Summary (Last 24 hours) at 07/27/2023 0843 Last data filed at 07/27/2023 0500 Gross per 24 hour  Intake 449.88 ml  Output 2300 ml  Net -1850.12 ml      Physical Exam    General:  Well appearing elderly female. Neck: L internal jugular TVP,  Cor: Regular rate & rhythm. 2/6 SEM RUSB with absent S2, tunneled HD cath R upper chest Lungs: clear anteriorly Abdomen: soft, nontender, nondistended.  Extremities: no edema Neuro: alert & orientedx3. Affect pleasant   Telemetry   Junctional brady 40s, intermittently V paced  Labs    CBC Recent Labs    07/26/23 0818 07/27/23 0731  WBC 6.1 7.3  HGB 9.9* 11.3*  HCT 31.9* 35.6*  MCV 89.6 89.4  PLT 260 293   Basic Metabolic Panel Recent Labs    92/92/74 0255 07/27/23 0731  NA 138 134*  K 3.9 4.6  CL 103 101  CO2 25 22  GLUCOSE 85 206*  BUN 41* 30*  CREATININE 4.33* 3.52*  CALCIUM  9.0 9.3  MG 1.9 1.9   Liver Function Tests No results for input(s): AST, ALT, ALKPHOS, BILITOT, PROT, ALBUMIN  in the last 72 hours.  No results for input(s): LIPASE,  AMYLASE in the last 72 hours. Cardiac Enzymes No results for input(s): CKTOTAL, CKMB, CKMBINDEX, TROPONINI in the last 72 hours.  BNP: BNP (last 3 results) Recent Labs    01/03/23 0925 06/12/23 1945 07/22/23 1456  BNP 709.1* 1,627.4* 1,346.7*    ProBNP (last 3 results) No results for input(s): PROBNP in the last 8760 hours.   D-Dimer No results for input(s): DDIMER in the last 72 hours. Hemoglobin A1C No results for input(s): HGBA1C in the last 72 hours. Fasting Lipid Panel No results for input(s): CHOL, HDL, LDLCALC, TRIG, CHOLHDL, LDLDIRECT in the last 72 hours. Thyroid  Function Tests No results for input(s): TSH, T4TOTAL, T3FREE, THYROIDAB in the last 72 hours.  Invalid input(s): FREET3   Other results:   Imaging    CT Angio Abd/Pel w/ and/or w/o Result Date: 07/27/2023 CLINICAL DATA:  78 year old female with planned transarterial aortic valve replacement. EXAM: CT ANGIOGRAPHY CHEST, ABDOMEN AND PELVIS TECHNIQUE: Non-contrast CT of the chest was initially obtained. Multidetector CT imaging through the chest, abdomen and pelvis was performed using the standard protocol during bolus administration of intravenous contrast. Multiplanar reconstructed images and MIPs were obtained and reviewed to evaluate the vascular anatomy. RADIATION DOSE REDUCTION: This exam was performed according to the departmental dose-optimization program which includes automated exposure  control, adjustment of the mA and/or kV according to patient size and/or use of iterative reconstruction technique. CONTRAST:  OMNIPAQUE  IOHEXOL  350 MG/ML SOLN COMPARISON:  06/08/2023 FINDINGS: CTA CHEST FINDINGS VASCULAR Preferential opacification of the thoracic aorta. No evidence of thoracic aortic aneurysm or dissection. Global cardiomegaly, most prominent about the left atrium. Coarse mitral annular calcifications. Aortic valvular calcifications are present. Three vessel  coronary atherosclerotic calcifications. Shared ostium of the brachiocephalic and left common carotid arteries. Mild scattered atherosclerotic calcifications about the thoracic aorta. No pericardial effusion. Right internal jugular approach tunneled hemodialysis catheter with the catheter tip terminating at the cavoatrial junction. Left internal jugular central venous catheter in place with the catheter tip in the right ventricle. NON VASCULAR Mediastinum/Nodes: No enlarged mediastinal, hilar, or axillary lymph nodes. Thyroid  gland, trachea, and esophagus demonstrate no significant findings. Lungs/Pleura: No focal consolidations. No suspicious pulmonary nodules. No pleural effusion or pneumothorax. Musculoskeletal: No chest wall abnormality. No acute or significant osseous findings. Review of the MIP images confirms the above findings. CTA ABDOMEN AND PELVIS FINDINGS VASCULAR Aorta: Normal caliber aorta without aneurysm, dissection, vasculitis or significant stenosis. Scattered atherosclerotic calcifications. Celiac: Patent without evidence of aneurysm, dissection, vasculitis or significant stenosis. SMA: Patent without evidence of aneurysm, dissection, vasculitis or significant stenosis. Renals: Moderate bilateral ostial stenosis secondary to atherosclerotic plaque. IMA: Occluded proximally. Inflow: Patent without evidence of aneurysm, dissection, vasculitis or significant stenosis. Scattered atherosclerotic calcifications. Veins: No obvious venous abnormality within the limitations of this arterial phase study. Review of the MIP images confirms the above findings. NON-VASCULAR Hepatobiliary: No focal liver abnormality is seen. No gallstones, gallbladder wall thickening, or biliary dilatation. Pancreas: Unremarkable. No pancreatic ductal dilatation or surrounding inflammatory changes. Spleen: Normal in size without focal abnormality. Adrenals/Urinary Tract: Adrenal glands are unremarkable. 7 mm simple cyst about  the left interpolar renal cortex. Kidneys are otherwise normal, without renal calculi, focal lesion, or hydronephrosis. Bladder is unremarkable. Stomach/Bowel: Stomach is within normal limits. Appendix is not definitively visualized. Scattered sigmoid diverticula. No evidence of bowel wall thickening, distention, or inflammatory changes. Lymphatic: No abdominopelvic lymphadenopathy. Reproductive: Status post hysterectomy. No adnexal masses. Other: No abdominal wall hernia or abnormality. No abdominopelvic ascites. Musculoskeletal: Thoracolumbosacral posterior fusion device in place extending from T10-S1. IMPRESSION: VASCULAR 1. No significant aortic or arterial inflow vessel stenosis. 2. Coronary and aortic atherosclerosis (ICD10-I70.0). 3. Cardiomegaly with aortic valvular and mitral annular calcifications. 4. 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 NON-VASCULAR 1. No acute intrathoracic or abdominopelvic abnormality. 2. Diverticulosis. Rhonda Sides, MD [facility] Radiology Electronically Signed   By: Rhonda Clayton M.D.   On: 07/27/2023 06:31   CT ANGIO CHEST AORTA W/CM & OR WO/CM Result Date: 07/27/2023 CLINICAL DATA:  78 year old female with planned transarterial aortic valve replacement. EXAM: CT ANGIOGRAPHY CHEST, ABDOMEN AND PELVIS TECHNIQUE: Non-contrast CT of the chest was initially obtained. Multidetector CT imaging through the chest, abdomen and pelvis was performed using the standard protocol during bolus administration of intravenous contrast. Multiplanar reconstructed images and MIPs were obtained and reviewed to evaluate the vascular anatomy. RADIATION DOSE REDUCTION: This exam was performed according to the departmental dose-optimization program which includes automated exposure control, adjustment of the mA and/or kV according to patient size and/or use of iterative reconstruction technique. CONTRAST:   OMNIPAQUE  IOHEXOL  350 MG/ML SOLN COMPARISON:  06/08/2023 FINDINGS: CTA CHEST FINDINGS VASCULAR Preferential opacification of the thoracic aorta. No evidence of thoracic aortic aneurysm or dissection. Global cardiomegaly, most prominent about  the left atrium. Coarse mitral annular calcifications. Aortic valvular calcifications are present. Three vessel coronary atherosclerotic calcifications. Shared ostium of the brachiocephalic and left common carotid arteries. Mild scattered atherosclerotic calcifications about the thoracic aorta. No pericardial effusion. Right internal jugular approach tunneled hemodialysis catheter with the catheter tip terminating at the cavoatrial junction. Left internal jugular central venous catheter in place with the catheter tip in the right ventricle. NON VASCULAR Mediastinum/Nodes: No enlarged mediastinal, hilar, or axillary lymph nodes. Thyroid  gland, trachea, and esophagus demonstrate no significant findings. Lungs/Pleura: No focal consolidations. No suspicious pulmonary nodules. No pleural effusion or pneumothorax. Musculoskeletal: No chest wall abnormality. No acute or significant osseous findings. Review of the MIP images confirms the above findings. CTA ABDOMEN AND PELVIS FINDINGS VASCULAR Aorta: Normal caliber aorta without aneurysm, dissection, vasculitis or significant stenosis. Scattered atherosclerotic calcifications. Celiac: Patent without evidence of aneurysm, dissection, vasculitis or significant stenosis. SMA: Patent without evidence of aneurysm, dissection, vasculitis or significant stenosis. Renals: Moderate bilateral ostial stenosis secondary to atherosclerotic plaque. IMA: Occluded proximally. Inflow: Patent without evidence of aneurysm, dissection, vasculitis or significant stenosis. Scattered atherosclerotic calcifications. Veins: No obvious venous abnormality within the limitations of this arterial phase study. Review of the MIP images confirms the above findings.  NON-VASCULAR Hepatobiliary: No focal liver abnormality is seen. No gallstones, gallbladder wall thickening, or biliary dilatation. Pancreas: Unremarkable. No pancreatic ductal dilatation or surrounding inflammatory changes. Spleen: Normal in size without focal abnormality. Adrenals/Urinary Tract: Adrenal glands are unremarkable. 7 mm simple cyst about the left interpolar renal cortex. Kidneys are otherwise normal, without renal calculi, focal lesion, or hydronephrosis. Bladder is unremarkable. Stomach/Bowel: Stomach is within normal limits. Appendix is not definitively visualized. Scattered sigmoid diverticula. No evidence of bowel wall thickening, distention, or inflammatory changes. Lymphatic: No abdominopelvic lymphadenopathy. Reproductive: Status post hysterectomy. No adnexal masses. Other: No abdominal wall hernia or abnormality. No abdominopelvic ascites. Musculoskeletal: Thoracolumbosacral posterior fusion device in place extending from T10-S1. IMPRESSION: VASCULAR 1. No significant aortic or arterial inflow vessel stenosis. 2. Coronary and aortic atherosclerosis (ICD10-I70.0). 3. Cardiomegaly with aortic valvular and mitral annular calcifications. 4. 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 NON-VASCULAR 1. No acute intrathoracic or abdominopelvic abnormality. 2. Diverticulosis. Rhonda Sides, MD [facility] Radiology Electronically Signed   By: Rhonda Clayton M.D.   On: 07/27/2023 06:31   DG Chest 2 View Result Date: 07/27/2023 CLINICAL DATA:  [id number] Preop examination [id number] EXAM: CHEST - 2 VIEW COMPARISON:  Chest x-ray 07/22/2023 FINDINGS: Right chest wall dialysis catheter with tip overlying the right atrium. Left superior approach venous catheter with tip overlying the right atrium. The heart and mediastinal contours are within normal limits. No focal consolidation. No pulmonary edema. No pleural effusion. No  pneumothorax. No acute osseous abnormality. IMPRESSION: 1. No active cardiopulmonary disease. 2. Lines and tubes as above. 3.  Aortic Atherosclerosis (ICD10-I70.0). Electronically Signed   By: Morgane  Naveau M.D.   On: 07/27/2023 00:44   CT CORONARY MORPH W/CTA COR W/SCORE W/CA W/CM &/OR WO/CM Result Date: 07/26/2023 CLINICAL DATA:  Aortic valve replacement (TAVR), pre-op  eval Severe aortic stenosis EXAM: Cardiac TAVR CT TECHNIQUE: The patient was scanned on a Siemens Force 192 slice scanner. A 120 kV retrospective scan was triggered in the descending thoracic aorta at 111 HU's. Gantry rotation speed was 270 msecs and collimation was .9 mm. The 3D data set was reconstructed in 5% intervals of the R-R cycle. Systolic and diastolic phases were analyzed on a dedicated work station  using MPR, MIP and VRT modes. The patient received 100mL OMNIPAQUE  IOHEXOL  350 MG/ML SOLN of contrast. FINDINGS: Aortic Valve: Tricuspid aortic valve with severely reduced cusp excursion. Severely thickened and severely calcified aortic valve cusps. AV calcium  score: 1268 Virtual Basal Annulus Measurements: Maximum/Minimum Diameter: 29.4 x 22.9 mm Perimeter:  81 mm Area:  503 mm2 No significant LVOT calcifications. Membranous septal length: 8 mm Based on these measurements, the annulus would be suitable for a 26 mm Sapien 3 valve. Alternatively, Heart Team can consider 29mm Evolut valve. Recommend Heart Team discussion for valve selection. Sinus of Valsalva Measurements: Non-coronary:  30 mm Right - coronary:  31 mm Left - coronary:  30 mm Coronary height and sinus of Valsalva Height: Left main: 19 mm, Left sinus: 24 mm Right coronary: 24 mm, Right sinus: 27 mm Aorta: Severe atherosclerosis of the descending aorta. Sinotubular Junction:  29 mm Ascending Thoracic Aorta:  33 mm Aortic Arch:  28 mm Descending Thoracic Aorta:  23 mm Coronary Arteries: Normal coronary origin. Right dominance. The study was performed without use of NTG and  insufficient for plaque evaluation. Coronary artery calcium  seen in 3 vessel distribution. Optimum Fluoroscopic Angle for Delivery: LAO 20 CAU 12 OTHER: Atria: Biatrial dilation, mild. Left atrial appendage: No thrombus. Mitral valve: Severe mitral annular calcifications. Pulmonary artery: Normal caliber. Pulmonary veins: Normal anatomy. IMPRESSION: 1. Tricuspid aortic valve with severely reduced cusp excursion. Severely thickened and severely calcified aortic valve cusps. 2. Aortic valve calcium  score: 1268 3. Annulus area: 503 mm2, suitable for 26 mm Sapien 3 valve. No LVOT calcifications. Membranous septal length 8 mm. 4. Sufficient coronary artery heights from annulus. 5. Optimum fluoroscopic angle for delivery: LAO 20 CAU 12 Electronically Signed   By: Soyla Merck M.D.   On: 07/26/2023 16:30      Medications:     Scheduled Medications:  aspirin  EC  81 mg Oral Daily   atorvastatin   40 mg Oral Daily   Chlorhexidine  Gluconate Cloth  6 each Topical Daily   darbepoetin (ARANESP ) injection - DIALYSIS  40 mcg Subcutaneous Q Fri-1800   ezetimibe   10 mg Oral QHS   furosemide   40 mg Oral Once per day on Sunday Tuesday Thursday Saturday   heparin   5,000 Units Subcutaneous Q8H   insulin  aspart  0-15 Units Subcutaneous TID WC   insulin  aspart  0-5 Units Subcutaneous QHS   levothyroxine   100 mcg Oral Once per day on Sunday Tuesday Wednesday Thursday Saturday   And   levothyroxine   50 mcg Oral Once per day on Monday Friday   midodrine   5 mg Oral Q M,W,F-HD   polyethylene glycol  17 g Oral Daily   senna-docusate  1 tablet Oral QHS   sodium chloride  flush  3 mL Intravenous Q12H    Infusions:  sodium chloride  10 mL/hr at 07/27/23 0500   anticoagulant sodium citrate       PRN Medications: sodium chloride , acetaminophen  **OR** acetaminophen , anticoagulant sodium citrate , feeding supplement (NEPRO CARB STEADY), heparin , heparin , hydrALAZINE , ipratropium-albuterol , lidocaine  (PF),  lidocaine -prilocaine , lip balm, mouth rinse, pentafluoroprop-tetrafluoroeth, sodium chloride  flush    Assessment/Plan   1. Bradycardia: History of trifascicular block.  Admitted with syncopal episodes and found to have long symptomatic sinus pauses.  TTVP placed.  - Continues with intermittent pacing - Maintain TTVP, backup set 40 bpm.  - She will need permanent pacemaker. Given ESRD Dr. Nancey has been planning on leadless device.  Plan for TAVR today followed by dual chamber leadless PPM 07/10. -  EP following.   2. ESRD: Recently started HD.   - Nephrology following. She does not appear grossly volume overloaded. HD schedule per nephrology    3. CAD: Diffuse CAD noted on 6/25 LHC with 80% m and 99% d LAD stenosis, 100% D1, 80% mLCx with 70% d LCx.  No intervention given diffuse mid to distal LAD and LCx disease and no ACS.  She denies chest pain.  Plan to continue medical management for now.  - Continue ASA 81 mg daily  - Continue Zetia  10 mg daily  - Atorvastatin  40 mg added this admit.  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  She does not want to take a medication that involves injections either.   4. Aortic stenosis: Low flow/low gradient severe AS. Echo in 5/25 with EF 35-40%, peri-apical severe hypokinesis, AVA 0.89 cm^2 with mean gradient 26 mmHg.  - Planning for TAVR today with Dr. McAlhany/Dr. Lucas  5. Chronic systolic CHF:  Echo in 5/25 with EF 35-40%, peri-apical severe hypokinesis, low flow/low gradient severe AS with AVA 0.89 cm^2 with mean gradient 26 mmHg. 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   - Volume controlled by iHD - GDMT limited by ESRD - added 5 mg midodrine  on HD days - Takes po lasix  on nonHD days. Will continue. - no ? blocker w/ sinus pauses    CRITICAL CARE Performed by: COLLETTA SHAVER  N   Total critical care time: 13 minutes  Critical care time was exclusive of separately billable procedures and treating other patients.  Critical care was necessary to treat or prevent imminent or life-threatening deterioration.  Critical care was time spent personally by me on the following activities: development of treatment plan with patient and/or surrogate as well as nursing, discussions with consultants, evaluation of patient's response to treatment, examination of patient, obtaining history from patient or surrogate, ordering and performing treatments and interventions, ordering and review of laboratory studies, ordering and review of radiographic studies, pulse oximetry and re-evaluation of patient's condition.     Length of Stay: 5  FINCH, LINDSAY N, PA-C  07/27/2023, 8:43 AM  Advanced Heart Failure Team Pager [phone] (M-F; 7a - 5p)  Please contact [facility] for night-coverage after hours (5p -7a ) and weekends on [email or web address]  Agree with above.   Underw4ent HD yesterday with 2L off. Denies CP or SOB.  Remains in CHB with junctional escape 40-45bpm. Continues to VPace (I increased to 50bpm)   Seen by structural team. Anatomy sufficient for TAVR. Plan to proceed today  General:  Sitting up in bed No resp difficulty HEENT: normal Neck: supple. no JVD. RIJ TVP Cor: RRR 2/6 SEM RUSB. S2 absent  + HD cath Lungs: clear Abdomen: soft, nontender, nondistended. No hepatosplenomegaly. No bruits or masses. Good bowel sounds. Extremities: no cyanosis, clubbing, rash, edema Neuro: alert & orientedx3, cranial nerves grossly intact. moves all 4 extremities w/o difficulty. Affect pleasant  Remains in CHB with junctional escape requiring TVP pacing. Thresholds ok.    Plan TAVR today followed by pacer on Thursday.   Continue midodrine  on HD days for BP support (may improve post TAVR)  CRITICAL CARE Performed by: Cherrie Sieving  Total critical care time: 38  minutes  Critical care time was exclusive of separately billable procedures and treating other patients.  Critical care was necessary to treat  or prevent imminent or life-threatening deterioration.  Critical care was time spent personally by me (independent of midlevel providers or residents) on the following activities: development of treatment plan with patient and/or surrogate as well as nursing, discussions with consultants, evaluation of patient's response to treatment, examination of patient, obtaining history from patient or surrogate, ordering and performing treatments and interventions, ordering and review of laboratory studies, ordering and review of radiographic studies, pulse oximetry and re-evaluation of patient's condition.  Toribio Fuel, MD  2:48 PM

## 2023-07-27 NOTE — Anesthesia Preprocedure Evaluation (Signed)
 Anesthesia Evaluation  Patient identified by MRN, date of birth, ID band Patient awake    Reviewed: Allergy & Precautions, H&P , NPO status , Patient's Chart, lab work & pertinent test results  Airway Mallampati: II   Neck ROM: full    Dental   Pulmonary neg pulmonary ROS   breath sounds clear to auscultation       Cardiovascular hypertension, + Past MI and +CHF  + dysrhythmias + Valvular Problems/Murmurs AS  Rhythm:regular Rate:Normal     Neuro/Psych    GI/Hepatic   Endo/Other  diabetes, Type 2Hypothyroidism    Renal/GU Renal InsufficiencyRenal disease     Musculoskeletal  (+) Arthritis ,    Abdominal   Peds  Hematology   Anesthesia Other Findings   Reproductive/Obstetrics                              Anesthesia Physical Anesthesia Plan  ASA: 4  Anesthesia Plan: MAC   Post-op Pain Management:    Induction: Intravenous  PONV Risk Score and Plan: 2 and Propofol  infusion and Treatment may vary due to age or medical condition  Airway Management Planned: Simple Face Mask  Additional Equipment:   Intra-op Plan:   Post-operative Plan:   Informed Consent: I have reviewed the patients History and Physical, chart, labs and discussed the procedure including the risks, benefits and alternatives for the proposed anesthesia with the patient or authorized representative who has indicated his/her understanding and acceptance.     Dental advisory given  Plan Discussed with: CRNA, Anesthesiologist and Surgeon  Anesthesia Plan Comments:         Anesthesia Quick Evaluation

## 2023-07-28 ENCOUNTER — Inpatient Hospital Stay (HOSPITAL_COMMUNITY)

## 2023-07-28 ENCOUNTER — Encounter (HOSPITAL_COMMUNITY): Payer: Self-pay | Admitting: Cardiovascular Disease

## 2023-07-28 DIAGNOSIS — Z952 Presence of prosthetic heart valve: Secondary | ICD-10-CM

## 2023-07-28 DIAGNOSIS — I350 Nonrheumatic aortic (valve) stenosis: Secondary | ICD-10-CM

## 2023-07-28 DIAGNOSIS — I5022 Chronic systolic (congestive) heart failure: Secondary | ICD-10-CM | POA: Diagnosis not present

## 2023-07-28 DIAGNOSIS — L8990 Pressure ulcer of unspecified site, unspecified stage: Secondary | ICD-10-CM | POA: Insufficient documentation

## 2023-07-28 DIAGNOSIS — N186 End stage renal disease: Secondary | ICD-10-CM | POA: Diagnosis not present

## 2023-07-28 DIAGNOSIS — R001 Bradycardia, unspecified: Secondary | ICD-10-CM | POA: Diagnosis not present

## 2023-07-28 DIAGNOSIS — Z006 Encounter for examination for normal comparison and control in clinical research program: Secondary | ICD-10-CM

## 2023-07-28 LAB — ECHOCARDIOGRAM COMPLETE
AR max vel: 0.88 cm2
AV Area VTI: 0.88 cm2
AV Area mean vel: 0.83 cm2
AV Mean grad: 6 mmHg
AV Peak grad: 10.1 mmHg
Ao pk vel: 1.59 m/s
Area-P 1/2: 3.48 cm2
Calc EF: 56.4 %
MV VTI: 1.24 cm2
S' Lateral: 2.5 cm
Single Plane A2C EF: 54.2 %
Single Plane A4C EF: 57.1 %
Weight: 2821.89 [oz_av]

## 2023-07-28 LAB — CBC
HCT: 33.5 % — ABNORMAL LOW (ref 36.0–46.0)
Hemoglobin: 10.4 g/dL — ABNORMAL LOW (ref 12.0–15.0)
MCH: 28.3 pg (ref 26.0–34.0)
MCHC: 31 g/dL (ref 30.0–36.0)
MCV: 91 fL (ref 80.0–100.0)
Platelets: 261 K/uL (ref 150–400)
RBC: 3.68 MIL/uL — ABNORMAL LOW (ref 3.87–5.11)
RDW: 16.3 % — ABNORMAL HIGH (ref 11.5–15.5)
WBC: 10 K/uL (ref 4.0–10.5)
nRBC: 0 % (ref 0.0–0.2)

## 2023-07-28 LAB — BASIC METABOLIC PANEL WITH GFR
Anion gap: 9 (ref 5–15)
BUN: 35 mg/dL — ABNORMAL HIGH (ref 8–23)
CO2: 20 mmol/L — ABNORMAL LOW (ref 22–32)
Calcium: 8.1 mg/dL — ABNORMAL LOW (ref 8.9–10.3)
Chloride: 103 mmol/L (ref 98–111)
Creatinine, Ser: 3.91 mg/dL — ABNORMAL HIGH (ref 0.44–1.00)
GFR, Estimated: 11 mL/min — ABNORMAL LOW (ref >60)
Glucose, Bld: 87 mg/dL (ref 70–99)
Potassium: 3.7 mmol/L (ref 3.5–5.1)
Sodium: 132 mmol/L — ABNORMAL LOW (ref 135–145)

## 2023-07-28 LAB — GLUCOSE, CAPILLARY
Glucose-Capillary: 109 mg/dL — ABNORMAL HIGH (ref 70–99)
Glucose-Capillary: 133 mg/dL — ABNORMAL HIGH (ref 70–99)
Glucose-Capillary: 177 mg/dL — ABNORMAL HIGH (ref 70–99)
Glucose-Capillary: 294 mg/dL — ABNORMAL HIGH (ref 70–99)
Glucose-Capillary: 169 mg/dL — ABNORMAL HIGH (ref 70–99)

## 2023-07-28 LAB — MAGNESIUM: Magnesium: 1.9 mg/dL (ref 1.7–2.4)

## 2023-07-28 MED ORDER — HEPARIN SODIUM (PORCINE) 1000 UNIT/ML IJ SOLN
INTRAMUSCULAR | Status: AC
  Filled 2023-07-28: qty 2

## 2023-07-28 MED ORDER — SCOPOLAMINE 1 MG/3DAYS TD PT72
1.0000 | MEDICATED_PATCH | TRANSDERMAL | Status: DC
  Administered 2023-07-28 – 2023-07-31 (×2): 1.5 mg via TRANSDERMAL
  Filled 2023-07-28 (×2): qty 1

## 2023-07-28 MED ORDER — ONDANSETRON HCL 4 MG/2ML IJ SOLN
4.0000 mg | Freq: Once | INTRAMUSCULAR | Status: AC
  Administered 2023-07-28: 4 mg via INTRAVENOUS
  Filled 2023-07-28: qty 2

## 2023-07-28 NOTE — Plan of Care (Signed)
   Problem: Education: Goal: Knowledge of General Education information will improve Description: Including pain rating scale, medication(s)/side effects and non-pharmacologic comfort measures Outcome: Progressing   Problem: Health Behavior/Discharge Planning: Goal: Ability to manage health-related needs will improve Outcome: Progressing   Problem: Clinical Measurements: Goal: Ability to maintain clinical measurements within normal limits will improve Outcome: Progressing Goal: Diagnostic test results will improve Outcome: Progressing Goal: Cardiovascular complication will be avoided Outcome: Progressing   Problem: Activity: Goal: Risk for activity intolerance will decrease Outcome: Progressing   Problem: Nutrition: Goal: Adequate nutrition will be maintained Outcome: Progressing

## 2023-07-28 NOTE — Progress Notes (Signed)
Hd Start

## 2023-07-28 NOTE — Progress Notes (Addendum)
 [facility]   MULTIDISCIPLINARY HEART VALVE TEAM  Patient Name: Rhonda Clayton Date of Encounter: 07/28/2023  Admit date: 07/22/2023  Primary Care Provider: Avva, Ravisankar, MD [facility] Cardiologist: Lurena MARLA Red, MD  [facility] Electrophysiologist:  Will Gladis Norton, MD   Hospital Problem List     Principal Problem:   Symptomatic sinus bradycardia Active Problems:   DM (diabetes mellitus) (HCC)   HTN (hypertension)   Prolonged QT interval   Hyperlipidemia   Nonrheumatic aortic (valve) stenosis   ESRD on dialysis [facility])   Sick sinus syndrome (HCC)   S/P TAVR (transcatheter aortic valve replacement)   Pressure injury of skin     Subjective   No complaints other then mild right groin pain that improved with tylenol .   Inpatient Medications    Scheduled Meds:  aspirin  EC  81 mg Oral Daily   atorvastatin   40 mg Oral Daily   Chlorhexidine  Gluconate Cloth  6 each Topical Daily   Chlorhexidine  Gluconate Cloth  6 each Topical Q0600   darbepoetin (ARANESP ) injection - DIALYSIS  40 mcg Subcutaneous Q Fri-1800   ezetimibe   10 mg Oral QHS   furosemide   40 mg Oral Once per day on Sunday Tuesday Thursday Saturday   heparin   5,000 Units Subcutaneous Q8H   insulin  aspart  0-15 Units Subcutaneous TID WC   insulin  aspart  0-5 Units Subcutaneous QHS   levothyroxine   100 mcg Oral Once per day on Sunday Tuesday Wednesday Thursday Saturday   And   levothyroxine   50 mcg Oral Once per day on Monday Friday   midodrine   5 mg Oral Q M,W,F-HD   polyethylene glycol  17 g Oral Daily   senna-docusate  1 tablet Oral QHS   sodium chloride  flush  3 mL Intravenous Q12H   sodium chloride  flush  3 mL Intravenous Q12H   Continuous Infusions:  sodium chloride      anticoagulant sodium citrate      nitroGLYCERIN      phenylephrine  (NEO-SYNEPHRINE) Adult infusion 20 mcg/min (07/28/23 0600)   PRN Meds: sodium chloride , acetaminophen  **OR** acetaminophen , anticoagulant  sodium citrate , feeding supplement (NEPRO CARB STEADY), heparin , heparin , hydrALAZINE , ipratropium-albuterol , lidocaine  (PF), lidocaine -prilocaine , lip balm, morphine  injection, mouth rinse, oxyCODONE , pentafluoroprop-tetrafluoroeth, sodium chloride  flush, sodium chloride  flush   Vital Signs    Vitals:   07/28/23 0600 07/28/23 0615 07/28/23 0630 07/28/23 0645  BP: (!) 159/44 (!) 138/49 (!) 147/51 (!) 159/44  Pulse: (!) 50 (!) 50 (!) 50 (!) 51  Resp: (!) 7 15 14  (!) 5  Temp:      TempSrc:      SpO2: 97% 97% 97% 94%  Weight:        Intake/Output Summary (Last 24 hours) at 07/28/2023 0814 Last data filed at 07/28/2023 0600 Gross per 24 hour  Intake 1041.11 ml  Output 420 ml  Net 621.11 ml   Filed Weights   07/26/23 0500 07/27/23 0529 07/28/23 0500  Weight: 76.8 kg 79 kg 80 kg    Physical Exam    GEN: Well nourished, well developed, in no acute distress.  HEENT: Grossly normal.  Neck: RIJ pacer noted in neck Cardiac: RRR, no murmurs, rubs, or gallops. No clubbing, cyanosis, edema.   Respiratory:  Respirations regular and unlabored, clear to auscultation bilaterally. GI: Soft, nontender, nondistended, BS + x 4. MS: no deformity or atrophy. Skin: warm and dry, no rash.  Groin sites clear without hematoma or ecchymosis Neuro:  Strength and sensation are intact. Psych: AAOx3.  Normal affect.  Labs    CBC Recent Labs    07/27/23 0731 07/28/23 0315  WBC 7.3 10.0  HGB 11.3* 10.4*  HCT 35.6* 33.5*  MCV 89.4 91.0  PLT 293 261   Basic Metabolic Panel Recent Labs    92/91/74 0731 07/28/23 0315  NA 134* 132*  K 4.6 3.7  CL 101 103  CO2 22 20*  GLUCOSE 206* 87  BUN 30* 35*  CREATININE 3.52* 3.91*  CALCIUM  9.3 8.1*  MG 1.9 1.9    Telemetry    Paced rhythm in 50s - Personally Reviewed  ECG    No new tracing  Cardiac Studies   TAVR OPERATIVE NOTE     Date of Procedure:                07/27/2023   Preoperative Diagnosis:      Severe Aortic Stenosis     Postoperative Diagnosis:    Same    Procedure:        Transcatheter Aortic Valve Replacement - Percutaneous Right Transfemoral Approach             Edwards Sapien 3 Ultra Resilia THV (size 26 mm, model # 9755RSL, serial # [id number])              Co-Surgeons:                        Dorise LOIS Fellers, MD and [name], MD     Anesthesiologist:                  DOROTHA Peoples, MD   Echocardiographer:              EMERSON Leavens, MD   Pre-operative Echo Findings: Severe aortic stenosis mild left ventricular systolic dysfunction   Post-operative Echo Findings: No paravalvular leak Mild left ventricular systolic dysfunction  Patient Profile     Rhonda Clayton is a 78 y.o. female with a hx of HTN, CAD, HFrEF, ESRD recently started on HD (M,T,Th,F), trifascicular block with long sinus pauses, DMT2, and obesity who is being seen today for the evaluation of severe LFLG AS at the request of Dr. Nancey.   Assessment & Plan    Severe LFLG AS:  -- S/p successful TAVR with a 26 mm Edwards Sapien 3 Ultra Resilia THV via the TF approach on 07/27/23.  -- Post operative echo to be completed today.  -- Groin sites are stable.  -- Continue Asprin 81mg  daily.   -- Plan to meet with CRP phase II.  -- Plan for discharge after leadless pacer placement.  -- All structural follow up has been arranged.  We will sign off. Please call us  if any issues arise.    Chart reviewed, patient examined, agree with above.  She feels well.  HR 46. Pacer left at VVI 40. Planning leadless pacer tomorrow per EP. Groin sites look good. 2D echo today.  Dorise LOIS Fellers, MD  Signed, Lamarr Hummer, PA-C  07/28/2023, 8:14 AM  Pager [phone]

## 2023-07-28 NOTE — Progress Notes (Cosign Needed)
 [name], female   DOB: [birth year], 78 y.o.   MRN: [id number]     Advanced Heart Failure Rounding Note  Cardiologist: Lurena MARLA Red, MD   Chief Complaint: Syncope  Subjective:    Junctional rhythm in the 40s, back up rate set at 40.  Complaining of nausea this am. No other concerns.    Objective:   Weight Range: 80 kg Body mass index is 33.32 kg/m.   Vital Signs:   Temp:  [97.5 F (36.4 C)-98.2 F (36.8 C)] 98.2 F (36.8 C) (07/09 0831) Pulse Rate:  [44-69] 46 (07/09 0900) Resp:  [0-25] 12 (07/09 0900) BP: (69-167)/(34-103) 127/55 (07/09 0900) SpO2:  [89 %-100 %] 97 % (07/09 0800) Weight:  [80 kg] 80 kg (07/09 0500) Last BM Date : 07/28/23  Weight change: Filed Weights   07/26/23 0500 07/27/23 0529 07/28/23 0500  Weight: 76.8 kg 79 kg 80 kg    Intake/Output:   Intake/Output Summary (Last 24 hours) at 07/28/2023 1056 Last data filed at 07/28/2023 0906 Gross per 24 hour  Intake 1123.75 ml  Output 420 ml  Net 703.75 ml      Physical Exam    General:  Well appearing elderly female Neck: no JVD. L internal jugular TTVP.  Cor: Regular rate & rhythm, brady. No rubs, gallops or murmurs. Lungs: clear Abdomen: soft, nontender, nondistended. Extremities: no edema Neuro: alert & orientedx3. Affect pleasant    Telemetry   Junctional 40s   Labs    CBC Recent Labs    07/27/23 0731 07/28/23 0315  WBC 7.3 10.0  HGB 11.3* 10.4*  HCT 35.6* 33.5*  MCV 89.4 91.0  PLT 293 261   Basic Metabolic Panel Recent Labs    92/91/74 0731 07/28/23 0315  NA 134* 132*  K 4.6 3.7  CL 101 103  CO2 22 20*  GLUCOSE 206* 87  BUN 30* 35*  CREATININE 3.52* 3.91*  CALCIUM  9.3 8.1*  MG 1.9 1.9   Liver Function Tests No results for input(s): AST, ALT, ALKPHOS, BILITOT, PROT, ALBUMIN  in the last 72 hours.  No results for input(s): LIPASE, AMYLASE in the last 72 hours. Cardiac Enzymes No results for input(s): CKTOTAL, CKMB,  CKMBINDEX, TROPONINI in the last 72 hours.  BNP: BNP (last 3 results) Recent Labs    01/03/23 0925 06/12/23 1945 07/22/23 1456  BNP 709.1* 1,627.4* 1,346.7*    ProBNP (last 3 results) No results for input(s): PROBNP in the last 8760 hours.   D-Dimer No results for input(s): DDIMER in the last 72 hours. Hemoglobin A1C No results for input(s): HGBA1C in the last 72 hours. Fasting Lipid Panel No results for input(s): CHOL, HDL, LDLCALC, TRIG, CHOLHDL, LDLDIRECT in the last 72 hours. Thyroid  Function Tests No results for input(s): TSH, T4TOTAL, T3FREE, THYROIDAB in the last 72 hours.  Invalid input(s): FREET3   Other results:   Imaging    ECHOCARDIOGRAM LIMITED Result Date: 07/27/2023    ECHOCARDIOGRAM LIMITED REPORT   Patient Name:   Rhonda Clayton Date of Exam: 07/27/2023 Medical Rec #:  [id number]       Height:       61.0 in Accession #:    [id number]      Weight:       174.2 lb Date of Birth:  [birth year]       BSA:          1.781 m Patient Age:    32 years  BP:           131/57 mmHg Patient Gender: F               HR:           51 bpm. Exam Location:  Inpatient Procedure: Limited Echo, Cardiac Doppler and Color Doppler (Both Spectral and            Color Flow Doppler were utilized during procedure). Indications:     I35.0 Nonrheumatic aortic (valve) stenosis  History:         Patient has prior history of Echocardiogram examinations. CHF,                  Previous Myocardial Infarction, Abnormal ECG, Arrythmias:SSS,                  Signs/Symptoms:Shortness of Breath and Dyspnea; Risk                  Factors:Hypertension and Diabetes. ESRD.                  Aortic Valve: 26 mm Edwards Sapien prosthetic, stented (TAVR)                  valve is present in the aortic position. Procedure Date:                  07/27/2023.  Sonographer:     Ellouise Mose RDCS Referring Phys:  [id number] LAMARR SAUNDERS THOMPSON Diagnosing Phys: Stanly Leavens MD  Sonographer  Comments: Technically difficult study due to poor echo windows. IMPRESSIONS  1. Left ventricular ejection fraction, by estimation, is 35 to 40%. The left ventricle has moderately decreased function. The left ventricle demonstrates regional wall motion abnormalities (see scoring diagram/findings for description). There is mild left ventricular hypertrophy. Left ventricular diastolic parameters are indeterminate.  2. Right ventricular systolic function is normal.  3. The mitral valve is degenerative. Mild mitral valve regurgitation. Severe mitral annular calcification.  4. Pre procedure: low flow, low gradient aortic stenosis with mean gradient 13 mm Hg, Peak gradient 22 mm Hg but decrease stroke volume index and heavily calcified valve.     Post procedure a 26 mm Sapein valve was placed. No PVL. Mean gradient 1 mm Hg, Peak gradient 2 mm Hg, EOA 4.60 cm2. The aortic valve has been repaired/replaced. There is a 26 mm Edwards Sapien prosthetic (TAVR) valve present in the aortic position. Procedure Date: 07/27/2023. Echo findings are consistent with normal structure and function of the aortic valve prosthesis. FINDINGS  Left Ventricle: Left ventricular ejection fraction, by estimation, is 35 to 40%. The left ventricle has moderately decreased function. The left ventricle demonstrates regional wall motion abnormalities. There is mild left ventricular hypertrophy. Left ventricular diastolic parameters are indeterminate. Right Ventricle: Right ventricular systolic function is normal. Pericardium: There is no evidence of pericardial effusion. Mitral Valve: The mitral valve is degenerative in appearance. Severe mitral annular calcification. Mild mitral valve regurgitation. Tricuspid Valve: The tricuspid valve is normal in structure. Tricuspid valve regurgitation is mild. Aortic Valve: Pre procedure: low flow, low gradient aortic stenosis with mean gradient 13 mm Hg, Peak gradient 22 mm Hg but decrease stroke volume index and  heavily calcified valve. Post procedure a 26 mm Sapein valve was placed. No PVL. Mean gradient 1 mm Hg, Peak gradient 2 mm Hg, EOA 4.60 cm2. The aortic valve has been repaired/replaced. Aortic valve mean gradient measures 1.0 mmHg. Aortic valve peak gradient measures 2.4  mmHg. Aortic valve area, by VTI measures 4.60 cm. There is a 26 mm Edwards Sapien prosthetic, stented (TAVR) valve present in the aortic position. Procedure Date: 07/27/2023. Echo findings are consistent with normal structure and function of the aortic valve prosthesis. Pulmonic Valve: The pulmonic valve was not well visualized. Aorta: The aortic root and ascending aorta are structurally normal, with no evidence of dilitation. Additional Comments: Spectral Doppler performed. Color Doppler performed.  LEFT VENTRICLE PLAX 2D LVOT diam:     2.60 cm LV SV:         85 LV SV Index:   48 LVOT Area:     5.31 cm  LV Volumes (MOD) LV vol d, MOD A2C: 75.9 ml LV vol d, MOD A4C: 84.8 ml LV vol s, MOD A2C: 46.5 ml LV vol s, MOD A4C: 53.8 ml LV SV MOD A2C:     29.4 ml LV SV MOD A4C:     84.8 ml LV SV MOD BP:      30.2 ml AORTIC VALVE AV Area (Vmax):    4.37 cm AV Area (Vmean):   4.05 cm AV Area (VTI):     4.60 cm AV Vmax:           77.70 cm/s AV Vmean:          54.600 cm/s AV VTI:            0.186 m AV Peak Grad:      2.4 mmHg AV Mean Grad:      1.0 mmHg LVOT Vmax:         63.90 cm/s LVOT Vmean:        41.700 cm/s LVOT VTI:          0.161 m LVOT/AV VTI ratio: 0.87  AORTA Ao Asc diam: 3.00 cm  SHUNTS Systemic VTI:  0.16 m Systemic Diam: 2.60 cm Stanly Leavens MD Electronically signed by Stanly Leavens MD Signature Date/Time: 07/27/2023/5:01:58 PM    Final    Structural Heart Procedure Result Date: 07/27/2023 See surgical note for result.     Medications:     Scheduled Medications:  aspirin  EC  81 mg Oral Daily   atorvastatin   40 mg Oral Daily   Chlorhexidine  Gluconate Cloth  6 each Topical Daily   Chlorhexidine  Gluconate Cloth  6 each  Topical Q0600   darbepoetin (ARANESP ) injection - DIALYSIS  40 mcg Subcutaneous Q Fri-1800   ezetimibe   10 mg Oral QHS   furosemide   40 mg Oral Once per day on Sunday Tuesday Thursday Saturday   heparin   5,000 Units Subcutaneous Q8H   insulin  aspart  0-15 Units Subcutaneous TID WC   insulin  aspart  0-5 Units Subcutaneous QHS   levothyroxine   100 mcg Oral Once per day on Sunday Tuesday Wednesday Thursday Saturday   And   levothyroxine   50 mcg Oral Once per day on Monday Friday   midodrine   5 mg Oral Q M,W,F-HD   polyethylene glycol  17 g Oral Daily   senna-docusate  1 tablet Oral QHS   sodium chloride  flush  3 mL Intravenous Q12H   sodium chloride  flush  3 mL Intravenous Q12H    Infusions:  sodium chloride      anticoagulant sodium citrate      nitroGLYCERIN      phenylephrine  (NEO-SYNEPHRINE) Adult infusion Stopped (07/28/23 0727)    PRN Medications: sodium chloride , acetaminophen  **OR** acetaminophen , anticoagulant sodium citrate , feeding supplement (NEPRO CARB STEADY), heparin , heparin , hydrALAZINE , ipratropium-albuterol , lidocaine  (PF), lidocaine -prilocaine , lip balm, morphine  injection,  mouth rinse, oxyCODONE , pentafluoroprop-tetrafluoroeth, sodium chloride  flush, sodium chloride  flush    Assessment/Plan   1. Bradycardia: History of trifascicular block.  Admitted with syncopal episodes and found to have long symptomatic sinus pauses.  TTVP placed.  - Junctional 40s this am - Maintain TTVP, backup set 40 bpm.  - Given ESRD plan on PPM with leadless device. Implant scheduled for tomorrow.   2. ESRD: Recently started HD.   - HD schedule per Nephrology. Planning HD tonight. - Does not appear grossly volume overloaded.  3. CAD: Diffuse CAD noted on 6/25 LHC with 80% m and 99% d LAD stenosis, 100% D1, 80% mLCx with 70% d LCx.  No intervention given diffuse mid to distal LAD and LCx disease and no ACS.  She denies chest pain.  Plan to continue medical management for now.  -  Continue ASA 81 mg daily  - Continue Zetia  10 mg daily  - Atorvastatin  40 mg added this admit.  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  She does not want to take a medication that involves injections either.   4. Aortic stenosis: Low flow/low gradient severe AS.  - S/p TAVR 07/27/23 with 26 mm S3 - Limited echo post TAVR: EF 35-40%, RV okay, mean gradient 1 mmHg across aortic valve prosthesis - Echo today pending  5. Chronic systolic CHF:  Echo in 5/25 with EF 35-40%, peri-apical severe hypokinesis, low flow/low gradient severe AS with AVA 0.89 cm^2 with mean gradient 26 mmHg. 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   - EF 35-40% on echo immediately post TAVR - Repeat echo today as above - Volume controlled by iHD, takes 40 mg po lasix  on nonHD days - GDMT limited by ESRD - added 5 mg midodrine  on HD days. Can reassess need for this now that she is post TAVR (parameters entered). BP appears higher today.  - no ? blocker w/ sinus pauses   CRITICAL CARE Performed by: COLLETTA SHAVER N   Total critical care time: 15 minutes  Critical care time was exclusive of separately billable procedures and treating other patients.  Critical care was necessary to treat or prevent imminent or life-threatening deterioration.  Critical care was time spent personally by me on the following activities: development of treatment plan with patient and/or surrogate as well as nursing, discussions with consultants, evaluation of patient's response to treatment, examination of patient, obtaining history from patient or surrogate, ordering and performing treatments and interventions, ordering and review of laboratory studies, ordering and review of radiographic studies, pulse oximetry and re-evaluation of patient's condition.   Length of Stay: 6  Carole Deere,  Echo Propp N, PA-C  07/28/2023, 10:56 AM  Advanced Heart Failure Team Pager [phone] (M-F; 7a - 5p)  Please contact [facility] for night-coverage after hours (5p -7a ) and weekends on [email or web address]  Agree with above  Doing well post-TAVR.   Remains pacer dependent via TVP with underlying CHB and junctional rhythm in 40s at times. Pacing now at 70   + intermittent nausea. No CP or SOB. Tolerating HD  General: Sitting up in bed No resp difficulty HEENT: normal Neck: supple. LIJ pacer. Cor: PMI nondisplaced. Regular rate & rhythm. No rubs, gallops or murmurs. Lungs: clear Abdomen: soft, nontender, nondistended. No hepatosplenomegaly. No bruits or masses. Good bowel sounds. Extremities: no cyanosis, clubbing, rash, edema groin sites  ok  Neuro: alert & orientedx3, cranial nerves grossly intact. moves all 4 extremities w/o difficulty. Affect pleasant   Post TAVR echo today EF 50-55% valve stable.   Still pacer dependent.  Pacing now at 70.   For iHD today  Leadless pacer tomorrow  Keep in ICU with TVP   CRITICAL CARE Performed by: Cherrie Sieving  Total critical care time: 38 minutes  Critical care time was exclusive of separately billable procedures and treating other patients.  Critical care was necessary to treat or prevent imminent or life-threatening deterioration.  Critical care was time spent personally by me (independent of midlevel providers or residents) on the following activities: development of treatment plan with patient and/or surrogate as well as nursing, discussions with consultants, evaluation of patient's response to treatment, examination of patient, obtaining history from patient or surrogate, ordering and performing treatments and interventions, ordering and review of laboratory studies, ordering and review of radiographic studies, pulse oximetry and re-evaluation of patient's condition.  Sieving Cherrie, MD  4:32 PM

## 2023-07-28 NOTE — Progress Notes (Signed)
Pre Hd 

## 2023-07-28 NOTE — Plan of Care (Signed)

## 2023-07-28 NOTE — Progress Notes (Signed)
  Patient Name: Rhonda Clayton Date of Encounter: 07/28/2023  Primary Cardiologist: Arun K Thukkani, MD Electrophysiologist: Soyla Gladis Norton, MD  Interval Summary   The patient is doing well today.  S/p TAVR 07/27/2023  Vital Signs    Vitals:   07/28/23 0600 07/28/23 0615 07/28/23 0630 07/28/23 0645  BP: (!) 159/44 (!) 138/49 (!) 147/51 (!) 159/44  Pulse: (!) 50 (!) 50 (!) 50 (!) 51  Resp: (!) 7 15 14  (!) 5  Temp:      TempSrc:      SpO2: 97% 97% 97% 94%  Weight:        Intake/Output Summary (Last 24 hours) at 07/28/2023 0734 Last data filed at 07/28/2023 0600 Gross per 24 hour  Intake 1041.11 ml  Output 420 ml  Net 621.11 ml   Filed Weights   07/26/23 0500 07/27/23 0529 07/28/23 0500  Weight: 76.8 kg 79 kg 80 kg    Physical Exam    GEN- The patient is well appearing, alert and oriented x 3 today.   Lungs- Clear to ausculation bilaterally, normal work of breathing Cardiac- Regular rate and rhythm, no murmurs, rubs or gallops GI- soft, NT, ND, + BS Extremities- no clubbing or cyanosis. No edema  Telemetry    V paced at 50 bpm (personally reviewed)  Hospital Course    Rhonda Clayton is a 78 y.o. female with a hx of low-flow low gradient aortic stenosis, diabetes, hyperlipidemia, hypertension, end-stage renal disease, trifascicular block. EP consulted with intermittent pauses of up to 8 seconds.    Plan definitive management of her AS -> s/p TAVR 07/27/2023  Plan for Dual Chamber Leadless [facility] 07/29/2023  Assessment & Plan    Sinus arrest With pauses up to 8 seconds in length.  Temp wire stable with threshold < 1.0. Pacing at 50 bpm today. Given ESRD will require LEADLESS PPM, which would require potentially hours of general sedation for dual chamber device(s).   Now s/p TAVR and on schedule for Dual Chamber Leadless tomorrow, 07/29/2023. Risks and benefits of procedures including but not limited to infection, bleeding, pericarditis, and pericardial effusion were  reviewed with the patient.    Severe AS Multivessel CAD S/p TAVR 07/27/2023  Appreciate structural care   ESRD Dialysis per nephrology   Dr. Nancey has seen. On for leadless tomorrow.  For questions or updates, please contact [facility] Please consult [email or web address] for contact info under     Signed, Ozell Prentice Passey, PA-C  07/28/2023, 7:34 AM

## 2023-07-28 NOTE — Progress Notes (Signed)
 [facility] Progress Note  Subjective:  Seen in room Feeling good today  Vitals:   07/28/23 0645 07/28/23 0800 07/28/23 0831 07/28/23 0900  BP: (!) 159/44 (!) 107/44  (!) 127/55  Pulse: (!) 51 (!) 50  (!) 46  Resp: (!) 5 13  12   Temp:   98.2 F (36.8 C)   TempSrc:   Oral   SpO2: 94% 97%    Weight:        Exam: Gen: no acute distress CV: normal rate (paced), no edema Respiratory: bilateral chest rise, normal work of breathing Gastrointestinal: soft, non-tender, no palpable masses or hernias Skin: no visible lesions or rashes  TDC in place    OP HD: [facility] 3h  B350   80kg  2K  TDC  heparin  2000 - Mircera 75 mcg IV q 2 weeks (last dose 07/13/2023 next dose due 07/21/2023-did not receive) - Venofer  50 mg IV q week    Assessment/Plan: Severe AS: S/p TAVR yesterday 7/08. Stable today.  Symptomatic bradycardia: Temporary pacer in now.  Plan for pacemaker tomorrow.  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 Next HD today/ tonight.  HTN: BP controlled, cont furosemide  on non HD days Volume: at dry wt, euvolemic on exam. UF 1-2 L w/ HD tonight.  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 No binders/ VDRA yet      Myer Fret MD  [facility] 07/28/2023, 11:24 AM  Recent Labs  Lab 07/22/23 2305 07/22/23 2307 07/24/23 0331 07/25/23 0257 07/27/23 0731 07/28/23 0315  HGB 10.1*   < >  --    < > 11.3* 10.4*  ALBUMIN  3.2*  --   --   --   --   --   CALCIUM  8.9   < > 9.0   < > 9.3 8.1*  PHOS 4.1  --  2.9  --   --   --   CREATININE 4.03*   < > 2.72*   < > 3.52* 3.91*  K 3.5   < > 3.5   < > 4.6 3.7   < > = values in this interval not displayed.   No results for input(s): IRON , TIBC, FERRITIN in the last 168 hours. Inpatient medications:  aspirin  EC  81 mg Oral Daily   atorvastatin   40 mg Oral Daily   Chlorhexidine  Gluconate Cloth  6 each Topical Daily   Chlorhexidine  Gluconate  Cloth  6 each Topical Q0600   darbepoetin (ARANESP ) injection - DIALYSIS  40 mcg Subcutaneous Q Fri-1800   ezetimibe   10 mg Oral QHS   furosemide   40 mg Oral Once per day on Sunday Tuesday Thursday Saturday   heparin   5,000 Units Subcutaneous Q8H   insulin  aspart  0-15 Units Subcutaneous TID WC   levothyroxine   100 mcg Oral Once per day on Sunday Tuesday Wednesday Thursday Saturday   And   levothyroxine   50 mcg Oral Once per day on Monday Friday   midodrine   5 mg Oral Q M,W,F-HD   polyethylene glycol  17 g Oral Daily   senna-docusate  1 tablet Oral QHS   sodium chloride  flush  3 mL Intravenous Q12H   sodium chloride  flush  3 mL Intravenous Q12H    sodium chloride      anticoagulant sodium citrate      nitroGLYCERIN      phenylephrine  (NEO-SYNEPHRINE) Adult infusion Stopped (07/28/23 0727)  sodium chloride , acetaminophen  **OR** acetaminophen , anticoagulant sodium citrate , feeding supplement (NEPRO CARB STEADY), heparin , heparin , hydrALAZINE , ipratropium-albuterol , lidocaine  (PF), lidocaine -prilocaine , lip balm, morphine  injection, mouth rinse, oxyCODONE , pentafluoroprop-tetrafluoroeth, sodium chloride  flush, sodium chloride  flush

## 2023-07-28 NOTE — Discharge Instructions (Addendum)
 Medication Changes: - START Atorvastatin  (Lipitor) 40mg  daily. I did not send in a new prescription of this because you said you already had this at home. - START Midodrine  5mg  on dialysis days (Monday, Wednesday, Fridays). - CONTINUE Lasix  40mg  on non-dialysis days.  Please check with your Nephrology/ dialysis team if you need continued Aranesp  as an outpatient.   ACTIVITY AND EXERCISE  Daily activity and exercise are an important part of your recovery. People recover at different rates depending on their general health and type of valve procedure.  Most people recovering from TAVR feel better relatively quickly   No lifting, pushing, pulling more than 10 pounds (examples to avoid: groceries, vacuuming, gardening, golfing):             - For one week with a procedure through the groin.             - For six weeks for procedures through the chest wall or neck. NOTE: You will typically see one of our providers 7-14 days after your procedure to discuss WHEN TO RESUME the above activities.      DRIVING  Do not drive until you are seen for follow up and cleared by a provider. Generally, we ask patient to not drive for 1 week after their procedure.  If you have been told by your doctor in the past that you may not drive, you must talk with him/her before you begin driving again.   DRESSING  Groin site: you may leave the clear dressing over the site for up to one week or until it falls off.   HYGIENE  If you had a femoral (leg) procedure, you may take a shower when you return home. After the shower, pat the site dry. Do NOT use powder, oils or lotions in your groin area until the site has completely healed.  If you had a chest procedure, you may shower when you return home unless specifically instructed not to by your discharging practitioner.             - DO NOT scrub incision; pat dry with a towel.             - DO NOT apply any lotions, oils, powders to the incision.             - No tub  baths / swimming for at least 2 weeks.  If you notice any fevers, chills, increased pain, swelling, bleeding or pus, please contact your doctor.   ADDITIONAL INFORMATION  If you are going to have an upcoming dental procedure, please contact our office as you will require antibiotics ahead of time to prevent infection on your heart valve.    If you have any questions or concerns you can call the structural heart phone during normal business hours 8am-4pm. If you have an urgent need after hours or weekends please call [phone] to talk to the on call provider for general cardiology. If you have an emergency that requires immediate attention, please call 911.    After TAVR Checklist  Check  Test Description   Follow up appointment in 1-2 weeks  You will see our structural heart advanced practice provider. Your incision sites will be checked and you will be cleared to drive and resume all normal activities if you are doing well.     1 month echo and follow up  You will have an echo to check on your new heart valve and be seen back in the office  by a structural heart advanced practice provider.   Follow up with your primary cardiologist You will need to be seen by your primary cardiologist in the following 3-6 months after your 1 month appointment in the valve clinic.    1 year echo and follow up You will have another echo to check on your heart valve after 1 year and be seen back in the office by a structural heart advanced practice provider. This your last structural heart visit.   Bacterial endocarditis prophylaxis  You will have to take antibiotics for the rest of your life before all dental procedures (even teeth cleanings) to protect your heart valve. Antibiotics are also required before some surgeries. Please check with your cardiologist before scheduling any surgeries. Also, please make sure to tell us  if you have a penicillin allergy as you will require an alternative antibiotic.    After  Your Leadless Pacemaker  You have a Abbott Pacemaker  Do not squat, lift over 5 lbs, or have sexual activity for 1 week.   Do not drive for 4 days, or until instructed by your healthcare provider that you are safe to do so.   Monitor your surgical site for redness, swelling, and drainage. Call the device clinic at [phone] if you experience these symptoms or fever/chills.  You may shower 1 day after your pacemaker implant and wash around the site with soap and water . Avoid lotions, ointments, or perfumes over your incision until it is well-healed.  You may use a hot tub or a pool AFTER your wound check appointment if the incision is completely closed.  Your Pacemaker may be MRI compatible. We will discuss this at your first follow up/wound check. .   Remote monitoring is used to monitor your pacemaker from home. This monitoring is scheduled every 91 days by our office. It allows us  to keep an eye on the functioning of your device to ensure it is working properly. You will routinely see your Electrophysiologist annually (more often if necessary).   _______________  Pacemaker Implantation, Care After This sheet gives you information about how to care for yourself after your procedure. Your health care provider may also give you more specific instructions. If you have problems or questions, contact your health care provider. What can I expect after the procedure? After the procedure, it is common to have: Mild pain. Slight bruising. Some swelling over the incision. You should received your Pacemaker ID card within 4-8 weeks. Follow these instructions at home: Medicines Take over-the-counter and prescription medicines only as told by your health care provider. If you were prescribed an antibiotic medicine, take it as told by your health care provider. Do not stop taking the antibiotic even if you start to feel better. Wound care  Check the incision site every day to make sure it is  not infected, bleeding, or starting to pull apart. Do not use lotions or ointments near the incision site unless directed to do so. Keep the incision area clean and dry for 7 days after the procedure or as directed by your health care provider. It takes several weeks for the incision site to completely heal. Do not take baths, swim, or use a hot tub until seen in the device clinic.  Activity Do not drive or use heavy machinery while taking prescription pain medicine. Do not drive for 24 hours if you were given a medicine to help you relax (sedative). Check with your health care provider before you start to drive or  play sports. You may go back to work when your health care provider says it is okay. Pacemaker care You may be shown how to transfer data from your pacemaker through the phone to your health care provider. Always let all health care providers know about your pacemaker before you have any medical procedures or tests. Wear a medical ID bracelet or necklace stating that you have a pacemaker. Carry a pacemaker ID card with you at all times. Your pacemaker battery will last for 5-15 years. Routine checks by your health care provider will let the health care provider know when the battery is starting to run down. The pacemaker will need to be replaced when the battery starts to run down. Do not use amateur Proofreader. Other electrical devices are safe to use, including power tools, lawn mowers, and speakers. If you are unsure of whether something is safe to use, ask your health care provider. When using your cell phone, hold it to the ear opposite the pacemaker. Do not leave your cell phone in a pocket over the pacemaker. Avoid places or objects that have a strong electric or magnetic field, including: Scientist, physiological. When at the airport, let officials know that you have a pacemaker. Power plants. Large electrical generators. Radiofrequency  transmission towers, such as cell phone and radio towers. General instructions Weigh yourself every day. If you suddenly gain weight, fluid may be building up in your body. Keep all follow-up visits as told by your health care provider. This is important. Contact a health care provider if: You gain weight suddenly. Your legs or feet swell. It feels like your heart is fluttering or skipping beats (heart palpitations). You have chills or a fever. You have more redness, swelling, or pain around your incisions. You have more fluid or blood coming from your incisions. Your incisions feel warm to the touch. You have pus or a bad smell coming from your incisions. Get help right away if: You have chest pain. You have trouble breathing or are short of breath. You become extremely tired. You are light-headed or you faint.  This information is not intended to replace advice given to you by your health care provider. Make sure you discuss any questions you have with your health care provider.

## 2023-07-29 ENCOUNTER — Encounter (HOSPITAL_COMMUNITY): Payer: Self-pay | Admitting: Cardiovascular Disease

## 2023-07-29 ENCOUNTER — Encounter (HOSPITAL_COMMUNITY): Admission: EM | Disposition: A | Discharge status: Home Health | Source: Home / Self Care | Attending: Cardiology

## 2023-07-29 ENCOUNTER — Inpatient Hospital Stay (HOSPITAL_COMMUNITY): Payer: Self-pay | Admitting: Certified Registered"

## 2023-07-29 ENCOUNTER — Encounter (HOSPITAL_COMMUNITY): Payer: Self-pay | Admitting: Certified Registered"

## 2023-07-29 DIAGNOSIS — I4430 Unspecified atrioventricular block: Secondary | ICD-10-CM

## 2023-07-29 DIAGNOSIS — R001 Bradycardia, unspecified: Secondary | ICD-10-CM | POA: Diagnosis not present

## 2023-07-29 DIAGNOSIS — I469 Cardiac arrest, cause unspecified: Secondary | ICD-10-CM

## 2023-07-29 DIAGNOSIS — I2510 Atherosclerotic heart disease of native coronary artery without angina pectoris: Secondary | ICD-10-CM

## 2023-07-29 DIAGNOSIS — I110 Hypertensive heart disease with heart failure: Secondary | ICD-10-CM

## 2023-07-29 DIAGNOSIS — Z006 Encounter for examination for normal comparison and control in clinical research program: Secondary | ICD-10-CM

## 2023-07-29 DIAGNOSIS — I5023 Acute on chronic systolic (congestive) heart failure: Secondary | ICD-10-CM

## 2023-07-29 DIAGNOSIS — I5022 Chronic systolic (congestive) heart failure: Secondary | ICD-10-CM | POA: Diagnosis not present

## 2023-07-29 DIAGNOSIS — N186 End stage renal disease: Secondary | ICD-10-CM | POA: Diagnosis not present

## 2023-07-29 HISTORY — PX: PACEMAKER LEADLESS INSERTION: EP1219

## 2023-07-29 LAB — CBC
HCT: 30.6 % — ABNORMAL LOW (ref 36.0–46.0)
HCT: 31.6 % — ABNORMAL LOW (ref 36.0–46.0)
Hemoglobin: 9.8 g/dL — ABNORMAL LOW (ref 12.0–15.0)
Hemoglobin: 10.1 g/dL — ABNORMAL LOW (ref 12.0–15.0)
MCH: 28.2 pg (ref 26.0–34.0)
MCH: 28 pg (ref 26.0–34.0)
MCHC: 32 g/dL (ref 30.0–36.0)
MCHC: 32 g/dL (ref 30.0–36.0)
MCV: 87.9 fL (ref 80.0–100.0)
MCV: 87.5 fL (ref 80.0–100.0)
Platelets: 222 K/uL (ref 150–400)
Platelets: 243 K/uL (ref 150–400)
RBC: 3.48 MIL/uL — ABNORMAL LOW (ref 3.87–5.11)
RBC: 3.61 MIL/uL — ABNORMAL LOW (ref 3.87–5.11)
RDW: 16.2 % — ABNORMAL HIGH (ref 11.5–15.5)
RDW: 16.1 % — ABNORMAL HIGH (ref 11.5–15.5)
WBC: 8 K/uL (ref 4.0–10.5)
WBC: 8.4 K/uL (ref 4.0–10.5)
nRBC: 0 % (ref 0.0–0.2)
nRBC: 0 % (ref 0.0–0.2)

## 2023-07-29 LAB — GLUCOSE, CAPILLARY
Glucose-Capillary: 171 mg/dL — ABNORMAL HIGH (ref 70–99)
Glucose-Capillary: 226 mg/dL — ABNORMAL HIGH (ref 70–99)
Glucose-Capillary: 297 mg/dL — ABNORMAL HIGH (ref 70–99)

## 2023-07-29 LAB — BASIC METABOLIC PANEL WITH GFR
Anion gap: 11 (ref 5–15)
BUN: 27 mg/dL — ABNORMAL HIGH (ref 8–23)
CO2: 20 mmol/L — ABNORMAL LOW (ref 22–32)
Calcium: 8.6 mg/dL — ABNORMAL LOW (ref 8.9–10.3)
Chloride: 102 mmol/L (ref 98–111)
Creatinine, Ser: 3.74 mg/dL — ABNORMAL HIGH (ref 0.44–1.00)
GFR, Estimated: 12 mL/min — ABNORMAL LOW (ref >60)
Glucose, Bld: 176 mg/dL — ABNORMAL HIGH (ref 70–99)
Potassium: 3.8 mmol/L (ref 3.5–5.1)
Sodium: 133 mmol/L — ABNORMAL LOW (ref 135–145)

## 2023-07-29 LAB — MAGNESIUM: Magnesium: 1.7 mg/dL (ref 1.7–2.4)

## 2023-07-29 SURGERY — PACEMAKER LEADLESS INSERTION
Anesthesia: General

## 2023-07-29 MED ORDER — FENTANYL CITRATE (PF) 100 MCG/2ML IJ SOLN: 25.0000 ug | INTRAMUSCULAR | Status: DC | PRN

## 2023-07-29 MED ORDER — IOHEXOL 350 MG/ML SOLN
INTRAVENOUS | Status: DC | PRN
  Administered 2023-07-29 (×3): 10 mL

## 2023-07-29 MED ORDER — SODIUM CHLORIDE 0.9 % IV SOLN: 250.0000 mL | INTRAVENOUS | Status: DC | PRN

## 2023-07-29 MED ORDER — SODIUM CHLORIDE 0.9 % IV SOLN: INTRAVENOUS | Status: DC | PRN

## 2023-07-29 MED ORDER — NOREPINEPHRINE BITARTRATE 1 MG/ML IV SOLN
INTRAVENOUS | Status: DC | PRN
  Administered 2023-07-29 (×2): 1 mL via INTRAVENOUS

## 2023-07-29 MED ORDER — FENTANYL CITRATE (PF) 100 MCG/2ML IJ SOLN
INTRAMUSCULAR | Status: AC
  Filled 2023-07-29: qty 2

## 2023-07-29 MED ORDER — HEPARIN SODIUM (PORCINE) 1000 UNIT/ML IJ SOLN
INTRAMUSCULAR | Status: DC | PRN
  Administered 2023-07-29 (×3): 3000 [IU] via INTRAVENOUS

## 2023-07-29 MED ORDER — LIDOCAINE 2% (20 MG/ML) 5 ML SYRINGE
INTRAMUSCULAR | Status: DC | PRN
  Administered 2023-07-29: 80 mg via INTRAVENOUS

## 2023-07-29 MED ORDER — ONDANSETRON HCL 4 MG/2ML IJ SOLN
INTRAMUSCULAR | Status: DC | PRN
  Administered 2023-07-29: 4 mg via INTRAVENOUS

## 2023-07-29 MED ORDER — HEPARIN (PORCINE) IN NACL 1000-0.9 UT/500ML-% IV SOLN
INTRAVENOUS | Status: DC | PRN
  Administered 2023-07-29 (×5): 500 mL

## 2023-07-29 MED ORDER — HEPARIN SODIUM (PORCINE) 1000 UNIT/ML IJ SOLN
INTRAMUSCULAR | Status: AC
  Filled 2023-07-29: qty 10

## 2023-07-29 MED ORDER — PHENYLEPHRINE 80 MCG/ML (10ML) SYRINGE FOR IV PUSH (FOR BLOOD PRESSURE SUPPORT)
PREFILLED_SYRINGE | INTRAVENOUS | Status: DC | PRN
  Administered 2023-07-29 (×2): 80 ug via INTRAVENOUS

## 2023-07-29 MED ORDER — ONDANSETRON HCL 4 MG/2ML IJ SOLN: 4.0000 mg | Freq: Once | INTRAMUSCULAR | Status: DC | PRN

## 2023-07-29 MED ORDER — SODIUM CHLORIDE 0.9% FLUSH: 3.0000 mL | INTRAVENOUS | Status: DC | PRN

## 2023-07-29 MED ORDER — BUPIVACAINE HCL (PF) 0.25 % IJ SOLN
INTRAMUSCULAR | Status: DC | PRN
  Administered 2023-07-29: 30 mL

## 2023-07-29 MED ORDER — CEFAZOLIN SODIUM-DEXTROSE 2-3 GM-%(50ML) IV SOLR
INTRAVENOUS | Status: DC | PRN
Start: 2023-07-29 — End: 2023-07-29
  Administered 2023-07-29: 2 g via INTRAVENOUS

## 2023-07-29 MED ORDER — ALBUMIN HUMAN 5 % IV SOLN: INTRAVENOUS | Status: DC | PRN

## 2023-07-29 MED ORDER — CEFAZOLIN SODIUM-DEXTROSE 2-4 GM/100ML-% IV SOLN
INTRAVENOUS | Status: AC
  Filled 2023-07-29: qty 100

## 2023-07-29 MED ORDER — SUGAMMADEX SODIUM 200 MG/2ML IV SOLN
INTRAVENOUS | Status: DC | PRN
  Administered 2023-07-29: 200 mg via INTRAVENOUS

## 2023-07-29 MED ORDER — VANCOMYCIN HCL IN DEXTROSE 1-5 GM/200ML-% IV SOLN: 1000.0000 mg | INTRAVENOUS | Status: DC

## 2023-07-29 MED ORDER — ROCURONIUM BROMIDE 10 MG/ML (PF) SYRINGE
PREFILLED_SYRINGE | INTRAVENOUS | Status: DC | PRN
  Administered 2023-07-29: 10 mg via INTRAVENOUS
  Administered 2023-07-29 (×2): 20 mg via INTRAVENOUS
  Administered 2023-07-29 (×3): 10 mg via INTRAVENOUS
  Administered 2023-07-29: 20 mg via INTRAVENOUS
  Administered 2023-07-29: 30 mg via INTRAVENOUS

## 2023-07-29 MED ORDER — FENTANYL CITRATE (PF) 250 MCG/5ML IJ SOLN
INTRAMUSCULAR | Status: DC | PRN
  Administered 2023-07-29 (×2): 50 ug via INTRAVENOUS

## 2023-07-29 MED ORDER — SODIUM CHLORIDE 0.9 % IV SOLN: INTRAVENOUS | Status: DC

## 2023-07-29 MED ORDER — ACETAMINOPHEN 10 MG/ML IV SOLN: 1000.0000 mg | Freq: Once | INTRAVENOUS | Status: DC | PRN

## 2023-07-29 MED ORDER — NOREPINEPHRINE 4 MG/250ML-% IV SOLN
INTRAVENOUS | Status: DC | PRN
  Administered 2023-07-29 (×2): 4 ug/min via INTRAVENOUS

## 2023-07-29 MED ORDER — SODIUM CHLORIDE 0.9% FLUSH
3.0000 mL | Freq: Two times a day (BID) | INTRAVENOUS | Status: DC
  Administered 2023-07-29 – 2023-07-30 (×3): 3 mL via INTRAVENOUS

## 2023-07-29 MED ORDER — DEXAMETHASONE SODIUM PHOSPHATE 10 MG/ML IJ SOLN
INTRAMUSCULAR | Status: DC | PRN
  Administered 2023-07-29: 5 mg via INTRAVENOUS

## 2023-07-29 MED ORDER — PROPOFOL 10 MG/ML IV BOLUS
INTRAVENOUS | Status: DC | PRN
  Administered 2023-07-29: 80 mg via INTRAVENOUS
  Administered 2023-07-29: 100 ug/kg/min via INTRAVENOUS

## 2023-07-29 MED ORDER — BUPIVACAINE HCL (PF) 0.25 % IJ SOLN
INTRAMUSCULAR | Status: AC
  Filled 2023-07-29: qty 30

## 2023-07-29 MED ORDER — SODIUM CHLORIDE 0.9% FLUSH: 3.0000 mL | Freq: Two times a day (BID) | INTRAVENOUS | Status: DC

## 2023-07-29 MED ORDER — ONDANSETRON HCL 4 MG/2ML IJ SOLN
4.0000 mg | Freq: Once | INTRAMUSCULAR | Status: AC
  Administered 2023-07-29: 4 mg via INTRAVENOUS
  Filled 2023-07-29: qty 2

## 2023-07-29 SURGICAL SUPPLY — 18 items
BAG SNAP BAND KOVER 36X36 (MISCELLANEOUS) IMPLANT
CABLE SURGICAL S-101-97-12 (CABLE) ×2 IMPLANT
CATH EXPO 5F MPA-1 (CATHETERS) IMPLANT
CATH RETRIEVAL AVEIR (CATHETERS) IMPLANT
CATH S G BIP PACING (CATHETERS) IMPLANT
CATHETER AVEIR LEADLESS DELIVE (CATHETERS) IMPLANT
CLOSURE PERCLOSE PROSTYLE (VASCULAR PRODUCTS) IMPLANT
GUIDEWIRE ANGLED .035X150CM (WIRE) IMPLANT
GUIDEWIRE SAF TJ AMPL .035X180 (WIRE) IMPLANT
PAD DEFIB RADIO PHYSIO CONN (PAD) ×2 IMPLANT
PMKR AVEIR LEADLESS RA 32.2 LS (Pacemaker) IMPLANT
PMKR AVEIR LEADLESS RV 38.0 LS (Pacemaker) IMPLANT
SHEATH AVEIR 25FR 50 (SHEATH) IMPLANT
SHEATH DILAT COONS TAPER 22F (SHEATH) IMPLANT
SHEATH PINNACLE 6F 10CM (SHEATH) IMPLANT
SHEATH PINNACLE 8F 10CM (SHEATH) IMPLANT
SHEATH PROBE COVER 6X72 (BAG) IMPLANT
TRAY PACEMAKER INSERTION (PACKS) ×2 IMPLANT

## 2023-07-29 NOTE — Anesthesia Postprocedure Evaluation (Signed)
 Anesthesia Post Note  Patient: Rhonda Clayton  Procedure(s) Performed: PACEMAKER LEADLESS INSERTION     Patient location during evaluation: PACU Anesthesia Type: General Level of consciousness: awake and alert Pain management: pain level controlled Vital Signs Assessment: post-procedure vital signs reviewed and stable Respiratory status: spontaneous breathing, nonlabored ventilation, respiratory function stable and patient connected to nasal cannula oxygen Cardiovascular status: blood pressure returned to baseline and stable Postop Assessment: no apparent nausea or vomiting Anesthetic complications: no   No notable events documented.  Last Vitals:  Vitals:   07/29/23 0630 07/29/23 0700  BP: 137/83 (!) 143/58  Pulse: (!) 53 69  Resp: 17 16  Temp:  36.6 C  SpO2: 97% 97%    Last Pain:  Vitals:   07/29/23 0800  TempSrc:   PainSc: 0-No pain                 Rome Ade

## 2023-07-29 NOTE — Anesthesia Preprocedure Evaluation (Signed)
 Anesthesia Evaluation  Patient identified by MRN, date of birth, ID band Patient awake    Reviewed: Allergy & Precautions, H&P , NPO status , Patient's Chart, lab work & pertinent test results  History of Anesthesia Complications Negative for: history of anesthetic complications  Airway Mallampati: II  TM Distance: >3 FB Neck ROM: full    Dental no notable dental hx. (+) Teeth Intact   Pulmonary neg sleep apnea, pneumonia, resolved, neg COPD, Patient abstained from smoking.Not current smoker   Pulmonary exam normal breath sounds clear to auscultation       Cardiovascular Exercise Tolerance: Poor METShypertension, Pt. on medications + CAD, + Past MI and +CHF  + dysrhythmias + Valvular Problems/Murmurs AS  Rhythm:Regular Rate:Normal - Systolic murmurs S/p TAVR yesterday, post op echo: IMPRESSIONS     1. S/P TAVR with mean gradient 6 mmHg and no AI; LV function improved  compared to previous.   2. Left ventricular ejection fraction, by estimation, is 50 to 55%. The  left ventricle has low normal function. The left ventricle has no regional  wall motion abnormalities. There is moderate left ventricular hypertrophy.  Left ventricular diastolic  parameters are indeterminate.   3. Right ventricular systolic function is normal. The right ventricular  size is normal.   4. Left atrial size was moderately dilated.   5. The mitral valve is normal in structure. No evidence of mitral valve  regurgitation. No evidence of mitral stenosis. Moderate mitral annular  calcification.   6. The aortic valve has been repaired/replaced. Aortic valve  regurgitation is not visualized. No aortic stenosis is present. There is a  26 mm Sapien prosthetic (TAVR) valve present in the aortic position. Echo  findings are consistent with normal  structure and function of the aortic valve prosthesis.   7. The inferior vena cava is dilated in size with <50%  respiratory  variability, suggesting right atrial pressure of 15 mmHg.     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 Verlin and Dr. Lucas. No date: Stress incontinence No date: Torn rotator cuff     Comment:  Bilateral No date: Type 2 diabetes mellitus (HCC) No date: Venous insufficiency 05/25/2019: Vitreous hemorrhage, left (HCC)     Comment:  The nature of the vitreous hemorrhage was discussed with              the patient as well as the common causes.   Patients with              diabetic eye disease may develop  retinal               neovascularization.  Other eye conditions develop retinal              neovascularization secondary to retinal venous               occlusions.   Vitreous hemorrhage may result from               spontaneous vitreous detachment or retinal breaks.  Blunt              trauma is a common cause as wl  Reproductive/Obstetrics                              Anesthesia Physical Anesthesia Plan  ASA: 3  Anesthesia Plan: General   Post-op Pain  Management: Minimal or no pain anticipated   Induction: Intravenous  PONV Risk Score and Plan: 2 and Ondansetron , Dexamethasone  and Treatment may vary due to age or medical condition  Airway Management Planned: Oral ETT  Additional Equipment: None  Intra-op Plan:   Post-operative Plan: Extubation in OR  Informed Consent: I have reviewed the patients History and Physical, chart, labs and discussed the procedure including the risks, benefits and alternatives for the proposed anesthesia with the patient or authorized representative who has indicated his/her understanding and acceptance.     Dental advisory given  Plan Discussed with: CRNA and Surgeon  Anesthesia Plan Comments: (Discussed risks of anesthesia with patient, including PONV, sore throat, lip/dental/eye damage, post operative cognitive dysfunction (patient very concerned about this, I did my best to reassure her, but I did note that there is no definitive way to eliminate the risk of POCD). Rare risks discussed as well, such as cardiorespiratory and neurological sequelae, and allergic reactions. Discussed the role of CRNA in patient's perioperative care. Patient understands.)         Anesthesia Quick Evaluation

## 2023-07-29 NOTE — Anesthesia Procedure Notes (Signed)
 Procedure Name: Intubation Date/Time: 07/29/2023 12:22 PM  Performed by: Mathew Bernardino RAMAN, RNPre-anesthesia Checklist: Patient identified, Emergency Drugs available, Suction available and Patient being monitored Patient Re-evaluated:Patient Re-evaluated prior to induction Oxygen Delivery Method: Circle system utilized Preoxygenation: Pre-oxygenation with 100% oxygen Induction Type: IV induction Ventilation: Mask ventilation without difficulty Laryngoscope Size: Mac and 3 Grade View: Grade I Tube type: Oral Tube size: 6.5 mm Number of attempts: 1 Airway Equipment and Method: Stylet Placement Confirmation: ETT inserted through vocal cords under direct vision, positive ETCO2 and breath sounds checked- equal and bilateral Secured at: 20 cm Tube secured with: Tape Dental Injury: Teeth and Oropharynx as per pre-operative assessment

## 2023-07-29 NOTE — Progress Notes (Signed)
 [facility] Progress Note  Subjective:  Seen in room No c/o's HD overnight 2.2 L off   Vitals:   07/29/23 0547 07/29/23 0600 07/29/23 0630 07/29/23 0700  BP:  133/60 137/83 (!) 143/58  Pulse:  71 (!) 53 69  Resp:  (!) 4 17 16   Temp:    97.8 F (36.6 C)  TempSrc:    Axillary  SpO2:  93% 97% 97%  Weight: 77 kg       Exam: Gen: no acute distress CV: normal rate (paced), no edema Respiratory: bilateral chest rise, normal work of breathing Gastrointestinal: soft, non-tender, no palpable masses or hernias Skin: no visible lesions or rashes  TDC in place    OP HD: [facility]h  B350   80kg  2K  TDC  heparin  2000 - Mircera 75 mcg IV q 2 weeks (last dose 07/13/2023 next dose due 07/21/2023-did not receive) - Venofer  50 mg IV q week    Assessment/Plan: Severe AS: S/p TAVR 7/08. Stable.  Symptomatic bradycardia: Temporary pacer in now.  For pacemaker today.  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 Next HD today/ tonight.  HTN: BP controlled, cont furosemide  on non HD days Volume: under dry wt, no vol excess on exam Anemia esrd: Hb 9- 11, ESA getting darbe 40 mcg weekly sq while here Metabolic bone disease -  BMD labs at goal. No binders/ VDRA yet      Myer Fret MD  [facility] 07/29/2023, 10:35 AM  Recent Labs  Lab 07/22/23 2305 07/22/23 2307 07/24/23 0331 07/25/23 0257 07/28/23 0315 07/29/23 0312 07/29/23 0834  HGB 10.1*   < >  --    < > 10.4* 9.8* 10.1*  ALBUMIN  3.2*  --   --   --   --   --   --   CALCIUM  8.9   < > 9.0   < > 8.1* 8.6*  --   PHOS 4.1  --  2.9  --   --   --   --   CREATININE 4.03*   < > 2.72*   < > 3.91* 3.74*  --   K 3.5   < > 3.5   < > 3.7 3.8  --    < > = values in this interval not displayed.   No results for input(s): IRON , TIBC, FERRITIN in the last 168 hours. Inpatient medications:  aspirin  EC  81 mg Oral Daily   atorvastatin   40 mg Oral Daily   Chlorhexidine  Gluconate Cloth  6 each Topical Daily   Chlorhexidine  Gluconate  Cloth  6 each Topical Q0600   darbepoetin (ARANESP ) injection - DIALYSIS  40 mcg Subcutaneous Q Fri-1800   ezetimibe   10 mg Oral QHS   furosemide   40 mg Oral Once per day on Sunday Tuesday Thursday Saturday   heparin   5,000 Units Subcutaneous Q8H   insulin  aspart  0-15 Units Subcutaneous TID WC   levothyroxine   100 mcg Oral Once per day on Sunday Tuesday Wednesday Thursday Saturday   And   levothyroxine   50 mcg Oral Once per day on Monday Friday   midodrine   5 mg Oral Q M,W,F-HD   polyethylene glycol  17 g Oral Daily   scopolamine   1 patch Transdermal Q72H   senna-docusate  1 tablet Oral QHS   sodium chloride  flush  3 mL Intravenous Q12H   sodium chloride  flush  3 mL Intravenous Q12H   sodium chloride  flush  3 mL Intravenous Q12H    sodium  chloride     sodium chloride      sodium chloride      anticoagulant sodium citrate      nitroGLYCERIN      phenylephrine  (NEO-SYNEPHRINE) Adult infusion Stopped (07/28/23 0727)   vancomycin      sodium chloride , sodium chloride , acetaminophen  **OR** acetaminophen , anticoagulant sodium citrate , feeding supplement (NEPRO CARB STEADY), heparin , heparin , hydrALAZINE , ipratropium-albuterol , lidocaine  (PF), lidocaine -prilocaine , lip balm, morphine  injection, mouth rinse, oxyCODONE , pentafluoroprop-tetrafluoroeth, sodium chloride  flush, sodium chloride  flush, sodium chloride  flush

## 2023-07-29 NOTE — Progress Notes (Signed)
  Patient Name: Rhonda Clayton Date of Encounter: 07/29/2023  Primary Cardiologist: Arun K Thukkani, MD Electrophysiologist: Soyla Gladis Norton, MD  Interval Summary   Still having Nausea, but otherwise doing well. Ready for pacing today.   Vital Signs    Vitals:   07/29/23 0547 07/29/23 0600 07/29/23 0630 07/29/23 0700  BP:  133/60 137/83 (!) 143/58  Pulse:  71 (!) 53 69  Resp:  (!) 4 17 16   Temp:    97.8 F (36.6 C)  TempSrc:    Axillary  SpO2:  93% 97% 97%  Weight: 77 kg       Intake/Output Summary (Last 24 hours) at 07/29/2023 0833 Last data filed at 07/28/2023 2332 Gross per 24 hour  Intake 126 ml  Output 2100 ml  Net -1974 ml   Filed Weights   07/27/23 0529 07/28/23 0500 07/29/23 0547  Weight: 79 kg 80 kg 77 kg    Physical Exam    GEN- NAD, Alert and oriented  Lungs- Clear to ausculation bilaterally, normal work of breathing Cardiac- Regular rate and rhythm, no murmurs, rubs or gallops GI- soft, NT, ND, + BS Extremities- no clubbing or cyanosis. No edema  Telemetry    V pacing at 70 (personally reviewed)  Hospital Course    Rhonda Clayton is a 78 y.o. female with a hx of low-flow low gradient aortic stenosis, diabetes, hyperlipidemia, hypertension, end-stage renal disease, trifascicular block. EP consulted with intermittent pauses of up to 8 seconds.    Plan definitive management of her AS -> s/p TAVR 07/27/2023   Plan for Dual Chamber Leadless PPM today  Assessment & Plan    Sinus arrest With pauses up to 8 seconds in length.  Temp wire stable with threshold < 1.0. Pacing at 70 bpm today given ongoing nausea and zofran  use  Plan for leadless PPM today. Explained risks, benefits, and alternatives to PPM implantation, including but not limited to bleeding, infection, pneumothorax, pericardial effusion, lead dislodgement, heart attack, stroke, or death.  Pt verbalized understanding and agrees to proceed.       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 Nancey has seen. On for pacing today.    For questions or updates, please contact [facility] Please consult [email or web address] for contact info under     Signed, Ozell Prentice Passey, PA-C  07/29/2023, 8:33 AM

## 2023-07-29 NOTE — Progress Notes (Addendum)
 [name], female   DOB: 1945/07/24, 78 y.o.   MRN: [id number]     Advanced Heart Failure Rounding Note  Cardiologist: Lurena MARLA Red, MD   Chief Complaint: Syncope  Subjective:    Still having some nausea. RN concerned about ICU delirium.   Objective:   Weight Range: 77 kg Body mass index is 32.07 kg/m.   Vital Signs:   Temp:  [97.5 F (36.4 C)-98.6 F (37 C)] 97.8 F (36.6 C) (07/10 0700) Pulse Rate:  [47-73] 69 (07/10 0700) Resp:  [0-24] 16 (07/10 0700) BP: (102-143)/(45-105) 143/58 (07/10 0700) SpO2:  [92 %-99 %] 97 % (07/10 0700) Weight:  [77 kg] 77 kg (07/10 0547) Last BM Date : 07/29/23  Weight change: Filed Weights   07/27/23 0529 07/28/23 0500 07/29/23 0547  Weight: 79 kg 80 kg 77 kg    Intake/Output:   Intake/Output Summary (Last 24 hours) at 07/29/2023 1035 Last data filed at 07/29/2023 0909 Gross per 24 hour  Intake 143 ml  Output 2100 ml  Net -1957 ml      Physical Exam    General:  Well appearing Neck: L internal jugular TTVP Cor: Regular rate & rhythm. No rubs, gallops or murmurs. Lungs: clear Abdomen: soft, nontender, nondistended. Extremities: no edema Neuro: alert & orientedx3. Affect pleasant   Telemetry   V pacing 70  Labs    CBC Recent Labs    07/29/23 0312 07/29/23 0834  WBC 8.0 8.4  HGB 9.8* 10.1*  HCT 30.6* 31.6*  MCV 87.9 87.5  PLT 222 243   Basic Metabolic Panel Recent Labs    92/90/74 0315 07/29/23 0312  NA 132* 133*  K 3.7 3.8  CL 103 102  CO2 20* 20*  GLUCOSE 87 176*  BUN 35* 27*  CREATININE 3.91* 3.74*  CALCIUM  8.1* 8.6*  MG 1.9 1.7   Liver Function Tests No results for input(s): AST, ALT, ALKPHOS, BILITOT, PROT, ALBUMIN  in the last 72 hours.  No results for input(s): LIPASE, AMYLASE in the last 72 hours. Cardiac Enzymes No results for input(s): CKTOTAL, CKMB, CKMBINDEX, TROPONINI in the last 72 hours.  BNP: BNP (last 3 results) Recent Labs     01/03/23 0925 06/12/23 1945 07/22/23 1456  BNP 709.1* 1,627.4* 1,346.7*    ProBNP (last 3 results) No results for input(s): PROBNP in the last 8760 hours.   D-Dimer No results for input(s): DDIMER in the last 72 hours. Hemoglobin A1C No results for input(s): HGBA1C in the last 72 hours. Fasting Lipid Panel No results for input(s): CHOL, HDL, LDLCALC, TRIG, CHOLHDL, LDLDIRECT in the last 72 hours. Thyroid  Function Tests No results for input(s): TSH, T4TOTAL, T3FREE, THYROIDAB in the last 72 hours.  Invalid input(s): FREET3   Other results:   Imaging    No results found.     Medications:     Scheduled Medications:  aspirin  EC  81 mg Oral Daily   atorvastatin   40 mg Oral Daily   Chlorhexidine  Gluconate Cloth  6 each Topical Daily   Chlorhexidine  Gluconate Cloth  6 each Topical Q0600   darbepoetin (ARANESP ) injection - DIALYSIS  40 mcg Subcutaneous Q Fri-1800   ezetimibe   10 mg Oral QHS   furosemide   40 mg Oral Once per day on Sunday Tuesday Thursday Saturday   heparin   5,000 Units Subcutaneous Q8H   insulin  aspart  0-15 Units Subcutaneous TID WC   levothyroxine   100 mcg Oral Once per day on Sunday Tuesday Wednesday Thursday Saturday  And   levothyroxine   50 mcg Oral Once per day on Monday Friday   midodrine   5 mg Oral Q M,W,F-HD   polyethylene glycol  17 g Oral Daily   scopolamine   1 patch Transdermal Q72H   senna-docusate  1 tablet Oral QHS   sodium chloride  flush  3 mL Intravenous Q12H   sodium chloride  flush  3 mL Intravenous Q12H   sodium chloride  flush  3 mL Intravenous Q12H    Infusions:  sodium chloride      sodium chloride      sodium chloride      anticoagulant sodium citrate      nitroGLYCERIN      phenylephrine  (NEO-SYNEPHRINE) Adult infusion Stopped (07/28/23 0727)   vancomycin       PRN Medications: sodium chloride , sodium chloride , acetaminophen  **OR** acetaminophen , anticoagulant sodium citrate , feeding  supplement (NEPRO CARB STEADY), heparin , heparin , hydrALAZINE , ipratropium-albuterol , lidocaine  (PF), lidocaine -prilocaine , lip balm, morphine  injection, mouth rinse, oxyCODONE , pentafluoroprop-tetrafluoroeth, sodium chloride  flush, sodium chloride  flush, sodium chloride  flush    Assessment/Plan   1. Bradycardia: History of trifascicular block.  Admitted with syncopal episodes and found to have long symptomatic sinus pauses.  TTVP placed.  - Given ESRD plan on PPM today with leadless device.  2. ESRD: Recently started HD.   - HD schedule per Nephrology. HD last night. - Does not appear overloaded today.  3. CAD: Diffuse CAD noted on 6/25 LHC with 80% m and 99% d LAD stenosis, 100% D1, 80% mLCx with 70% d LCx.  No intervention given diffuse mid to distal LAD and LCx disease and no ACS.  She denies chest pain.  Plan to continue medical management for now.  - Continue ASA 81 mg daily  - Continue Zetia  10 mg daily  - Atorvastatin  40 mg added this admit.  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  She does not want to take a medication that involves injections either.   4. Aortic stenosis: Low flow/low gradient severe AS.  - S/p TAVR 07/27/23 with 26 mm S3 - Limited echo post TAVR: EF 35-40%, RV okay, mean gradient 1 mmHg across aortic valve prosthesis - Echo 07/28/23: EF 50-55%, normal functioning aortic valve prosthesis  5. Chronic systolic CHF w/ recovered EF:  Echo in 5/25 with EF 35-40%, peri-apical severe hypokinesis, low flow/low gradient severe AS with AVA 0.89 cm^2 with mean gradient 26 mmHg. 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   - EF 35-40% on echo immediately post TAVR - EF improved to 50-55% on echo 0/7/09 - Volume controlled by iHD, takes 40 mg po lasix  on nonHD days - GDMT limited by ESRD - added 5 mg midodrine  on HD days. BP  improved post TAVR, however; did require midodrine  during HD last night. - no ? blocker w/ sinus pauses   Ready for transfer to progressive.  Advanced Heart Failure will no longer follow out of the ICU. She does not need AHF clinic folllow-up.  Length of Stay: 7  FINCH, LINDSAY N, PA-C  07/29/2023, 10:35 AM  Advanced Heart Failure Team Pager [phone] (M-F; 7a - 5p)  Please contact [facility] for night-coverage after hours (5p -7a ) and weekends on [email or web address]   Patient seen and examined with the above-signed Advanced Practice Provider and/or Housestaff. I personally reviewed laboratory data, imaging studies and relevant notes. I independently examined the patient and formulated  the important aspects of the plan. I have edited the note to reflect any of my changes or salient points. I have personally discussed the plan with the patient and/or family.  Feels nauseated. Denies CP or SOB  Remains VP paced.   General:  Sitting up in bed No resp difficulty HEENT: normal LIJ TVP  Neck: supple. no JVD. Carotids 2+ bilat; no bruits. No lymphadenopathy or thryomegaly appreciated. Cor: Regular rate & rhythm. No rubs, gallops or murmurs. Lungs: clear Abdomen: soft, nontender, nondistended. No hepatosplenomegaly. No bruits or masses. Good bowel sounds. Extremities: no cyanosis, clubbing, rash, edema Neuro: alert & orientedx3, cranial nerves grossly intact. moves all 4 extremities w/o difficulty. Affect pleasant  Remains pacer dependent vit VP in setting of CHB.   SBP improved after TAVR.   Volume status stable with HD  For leadless PM today  CRITICAL CARE Performed by: Cherrie Sieving  Total critical care time: 35 minutes  Critical care time was exclusive of separately billable procedures and treating other patients.  Critical care was necessary to treat or prevent imminent or life-threatening deterioration.  Critical care was time spent personally by me (independent of  midlevel providers or residents) on the following activities: development of treatment plan with patient and/or surrogate as well as nursing, discussions with consultants, evaluation of patient's response to treatment, examination of patient, obtaining history from patient or surrogate, ordering and performing treatments and interventions, ordering and review of laboratory studies, ordering and review of radiographic studies, pulse oximetry and re-evaluation of patient's condition.  Sieving Cherrie, MD  4:29 PM

## 2023-07-29 NOTE — Transfer of Care (Signed)
 Immediate Anesthesia Transfer of Care Note  Patient: Rhonda Clayton  Procedure(s) Performed: PACEMAKER LEADLESS INSERTION  Patient Location: ICU   Anesthesia Type:General  Level of Consciousness: sedated  Airway & Oxygen Therapy: Patient Spontanous Breathing and Patient connected to face mask oxygen  Post-op Assessment: Report given to RN and Post -op Vital signs reviewed and stable  Post vital signs: Reviewed and stable  Last Vitals:  Vitals Value Taken Time  BP 115/51 07/29/23 17:01  Temp    Pulse 70 07/29/23 17:02  Resp 22 07/29/23 17:02  SpO2 100 % 07/29/23 17:02  Vitals shown include unfiled device data.  Last Pain:  Vitals:   07/29/23 0800  TempSrc:   PainSc: 0-No pain      Patients Stated Pain Goal: 0 (07/28/23 0400)  Complications: No notable events documented.

## 2023-07-29 NOTE — TOC Progression Note (Signed)
 Transition of Care [facility]) - Progression Note    Patient Details  Name: STARR URIAS MRN: [id number] Date of Birth: [birth year]  Transition of [facility] CM/SW Contact  Justina Delcia Czar, RN Phone Number: [phone] 07/29/2023, 6:30 PM  Clinical Narrative:     Patient is active with [facility], will need resumption of care orders. Attending updated.   Expected Discharge Plan: Home w Home Health Services Barriers to Discharge: Continued Medical Work up  Expected Discharge Plan and Services In-house Referral: NA Discharge Planning Services: CM Consult Post Acute Care Choice: Home Health, Resumption of Svcs/PTA Provider Living arrangements for the past 2 months: Single Family Home Expected Discharge Date: 07/29/23                 DME Agency: NA       HH Arranged: PT, OT HH Agency: [facility]         Social Determinants of Health (SDOH) Interventions SDOH Screenings   Food Insecurity: No Food Insecurity (07/23/2023)  Housing: Low Risk  (07/23/2023)  Transportation Needs: No Transportation Needs (07/23/2023)  Utilities: Not At Risk (07/23/2023)  Depression (PHQ2-9): Low Risk  (06/10/2023)  Social Connections: Patient Declined (07/23/2023)  Tobacco Use: Low Risk  (07/23/2023)    Readmission Risk Interventions    07/26/2023    4:09 PM  Readmission Risk Prevention Plan  Transportation Screening Complete  HRI or Home Care Consult Complete  Social Work Consult for Recovery Care Planning/Counseling Complete  Palliative Care Screening Not Applicable  Medication Review Oceanographer) Referral to Pharmacy

## 2023-07-30 ENCOUNTER — Inpatient Hospital Stay (HOSPITAL_COMMUNITY)

## 2023-07-30 DIAGNOSIS — R001 Bradycardia, unspecified: Secondary | ICD-10-CM | POA: Diagnosis not present

## 2023-07-30 DIAGNOSIS — N186 End stage renal disease: Secondary | ICD-10-CM | POA: Diagnosis not present

## 2023-07-30 DIAGNOSIS — I5022 Chronic systolic (congestive) heart failure: Secondary | ICD-10-CM | POA: Diagnosis not present

## 2023-07-30 LAB — MAGNESIUM: Magnesium: 1.8 mg/dL (ref 1.7–2.4)

## 2023-07-30 LAB — CBC
HCT: 28.2 % — ABNORMAL LOW (ref 36.0–46.0)
Hemoglobin: 8.8 g/dL — ABNORMAL LOW (ref 12.0–15.0)
MCH: 27.8 pg (ref 26.0–34.0)
MCHC: 31.2 g/dL (ref 30.0–36.0)
MCV: 89 fL (ref 80.0–100.0)
Platelets: 214 K/uL (ref 150–400)
RBC: 3.17 MIL/uL — ABNORMAL LOW (ref 3.87–5.11)
RDW: 16 % — ABNORMAL HIGH (ref 11.5–15.5)
WBC: 6.4 K/uL (ref 4.0–10.5)
nRBC: 0 % (ref 0.0–0.2)

## 2023-07-30 LAB — BASIC METABOLIC PANEL WITH GFR
Anion gap: 15 (ref 5–15)
BUN: 33 mg/dL — ABNORMAL HIGH (ref 8–23)
CO2: 17 mmol/L — ABNORMAL LOW (ref 22–32)
Calcium: 8.5 mg/dL — ABNORMAL LOW (ref 8.9–10.3)
Chloride: 100 mmol/L (ref 98–111)
Creatinine, Ser: 4.16 mg/dL — ABNORMAL HIGH (ref 0.44–1.00)
GFR, Estimated: 10 mL/min — ABNORMAL LOW (ref >60)
Glucose, Bld: 290 mg/dL — ABNORMAL HIGH (ref 70–99)
Potassium: 4.4 mmol/L (ref 3.5–5.1)
Sodium: 132 mmol/L — ABNORMAL LOW (ref 135–145)

## 2023-07-30 LAB — GLUCOSE, CAPILLARY
Glucose-Capillary: 232 mg/dL — ABNORMAL HIGH (ref 70–99)
Glucose-Capillary: 324 mg/dL — ABNORMAL HIGH (ref 70–99)
Glucose-Capillary: 168 mg/dL — ABNORMAL HIGH (ref 70–99)

## 2023-07-30 MED ORDER — HEPARIN SODIUM (PORCINE) 1000 UNIT/ML IJ SOLN
INTRAMUSCULAR | Status: AC
Start: 2023-07-30 — End: 2023-07-30
  Filled 2023-07-30: qty 2

## 2023-07-30 MED ORDER — HEPARIN SODIUM (PORCINE) 1000 UNIT/ML IJ SOLN
2000.0000 [IU] | Freq: Once | INTRAMUSCULAR | Status: AC
  Administered 2023-07-30: 2000 [IU]

## 2023-07-30 MED ORDER — HEPARIN SODIUM (PORCINE) 1000 UNIT/ML IJ SOLN
INTRAMUSCULAR | Status: AC
  Filled 2023-07-30: qty 4

## 2023-07-30 NOTE — Progress Notes (Signed)
   07/30/23 1923  Vitals  Temp 97.8 F (36.6 C)  Pulse Rate 63  Resp 18  BP (!) 129/51  SpO2 99 %  O2 Device Room Air  Weight 79.4 kg  Type of Weight Post-Dialysis  Oxygen Therapy  Patient Activity (if Appropriate) In bed  Pulse Oximetry Type Continuous  Post Treatment  Dialyzer Clearance Lightly streaked  Hemodialysis Intake (mL) 0 mL  Liters Processed 41.9  Fluid Removed (mL) 2000 mL  Tolerated HD Treatment Yes  Post-Hemodialysis Comments HD completed   Received patient in bed to unit.  Alert and oriented.  Informed consent signed and in chart.    TX duration:2.5Hrs   Patient tolerated well.  Transported back to the room  Alert, without acute distress.  Hand-off given to patient's nurse.    Access used: CVC Access issues: Poor flow on Venous and Arterial access. BFR maintained at 236ml/min.    Total UF removed: 2L Medication(s) given: see eMar    Woodfin Dolores RN Kidney D[facility] Unit

## 2023-07-30 NOTE — Evaluation (Signed)
 Physical Therapy Evaluation Patient Details Name: Rhonda Clayton MRN: [id number] DOB: February 02, 1945 Today's Date: 07/30/2023  History of Present Illness  Pt is an 78 y.o. F presenting [facility]23 following tunnel cath replacement w/ symptoms of flushing and feeling faint, lasting several seconds, several times/day. Admitted with syncopal episodes and found to have long symptomatic sinus pauses. S/p TAVR 07/27/2023 and pacemaker leadless insertion. PMH is significant for anemia, CKD, DJD, DM, OP, venous insufficiency, aortic stenosis.  Clinical Impression  Pt s/p TAVR and pacemaker. She is overall mobilizing well for being bedridden for a week. Did opt to utilize RW for external support and seemed to be of benefit for postural stability and increased independence. Pt ambulating 150 ft with CGA. HR 61-87 bpm, SpO2 97% on RA, BP 110/69 (79) post walk. DOE 1/4. Pt with expected cardiopulmonary deficit and dynamic balance deficits. Will benefit from continued HHPT to address. Pt reports she plans to d/c home with sister initially. Reviewed fall precautions and home set up.         If plan is discharge home, recommend the following: A little help with bathing/dressing/bathroom;Assistance with cooking/housework;Assist for transportation;Help with stairs or ramp for entrance   Can travel by private vehicle        Equipment Recommendations None recommended by PT  Recommendations for Other Services       Functional Status Assessment Patient has had a recent decline in their functional status and demonstrates the ability to make significant improvements in function in a reasonable and predictable amount of time.     Precautions / Restrictions Precautions Precautions: Fall Precaution/Restrictions Comments: No arm restrictions s/p pacemaker per EP Restrictions Weight Bearing Restrictions Per Provider Order: No      Mobility  Bed Mobility Overal bed mobility: Modified Independent                   Transfers Overall transfer level: Needs assistance Equipment used: Rolling walker (2 wheels) Transfers: Sit to/from Stand Sit to Stand: Supervision                Ambulation/Gait Ambulation/Gait assistance: Contact guard assist Gait Distance (Feet): 150 Feet Assistive device: Rolling walker (2 wheels) Gait Pattern/deviations: Step-through pattern, Decreased stride length Gait velocity: decreased     General Gait Details: Min verbal cues for walker proximity  Stairs            Wheelchair Mobility     Tilt Bed    Modified Rankin (Stroke Patients Only)       Balance Overall balance assessment: Mild deficits observed, not formally tested                                           Pertinent Vitals/Pain Pain Assessment Pain Assessment: No/denies pain    Home Living Family/patient expects to be discharged to:: Private residence Living Arrangements: Other relatives (sister) Available Help at Discharge: Family Type of Home: House Home Access: Stairs to enter Entrance Stairs-Rails: Doctor, general practice of Steps: 3   Home Layout: One level Home Equipment: Agricultural consultant (2 wheels)      Prior Function Prior Level of Function : Independent/Modified Independent                     Extremity/Trunk Assessment   Upper Extremity Assessment Upper Extremity Assessment: RUE deficits/detail RUE Deficits / Details: Hx of  rotator cuff tear, shoulder flexion AROM 90-100 deg, able to comb hair    Lower Extremity Assessment Lower Extremity Assessment: Overall WFL for tasks assessed    Cervical / Trunk Assessment Cervical / Trunk Assessment: Kyphotic  Communication   Communication Communication: No apparent difficulties    Cognition Arousal: Alert Behavior During Therapy: WFL for tasks assessed/performed   PT - Cognitive impairments: No apparent impairments                         Following  commands: Intact       Cueing Cueing Techniques: Verbal cues     General Comments      Exercises     Assessment/Plan    PT Assessment Patient needs continued PT services  PT Problem List Decreased activity tolerance;Decreased strength;Decreased balance;Decreased mobility       PT Treatment Interventions DME instruction;Gait training;Stair training;Functional mobility training;Therapeutic activities;Therapeutic exercise;Balance training;Patient/family education    PT Goals (Current goals can be found in the Care Plan section)  Acute Rehab PT Goals Patient Stated Goal: go home tomorrow PT Goal Formulation: With patient Time For Goal Achievement: 08/13/23 Potential to Achieve Goals: Good    Frequency Min 2X/week     Co-evaluation               AM-PAC PT 6 Clicks Mobility  Outcome Measure Help needed turning from your back to your side while in a flat bed without using bedrails?: None Help needed moving from lying on your back to sitting on the side of a flat bed without using bedrails?: None Help needed moving to and from a bed to a chair (including a wheelchair)?: A Little Help needed standing up from a chair using your arms (e.g., wheelchair or bedside chair)?: A Little Help needed to walk in hospital room?: A Little Help needed climbing 3-5 steps with a railing? : A Little 6 Click Score: 20    End of Session Equipment Utilized During Treatment: Gait belt Activity Tolerance: Patient tolerated treatment well Patient left: in chair;with call bell/phone within reach;with chair alarm set Nurse Communication: Mobility status PT Visit Diagnosis: Unsteadiness on feet (R26.81)    Time: 8954-8892 PT Time Calculation (min) (ACUTE ONLY): 22 min   Charges:   PT Evaluation $PT Eval Low Complexity: 1 Low   PT General Charges $$ ACUTE PT VISIT: 1 Visit         Aleck Daring, PT, DPT Acute Rehabilitation Services Office [phone]   Alayne ONEIDA Daring 07/30/2023, 11:27 AM

## 2023-07-30 NOTE — Progress Notes (Signed)
 [facility] Progress Note  Subjective:  Seen in room  Vitals:   07/30/23 0700 07/30/23 0800 07/30/23 0900 07/30/23 1000  BP: (!) 139/121 (!) 144/55 103/70 134/65  Pulse: 62 69 61 64  Resp: 18 17 18  (!) 9  Temp:      TempSrc:      SpO2: 99% 95% 93% 98%  Weight:        Exam: Gen: no acute distress CV: normal rate (paced), no edema Respiratory: bilateral chest rise, normal work of breathing Gastrointestinal: soft, non-tender, no palpable masses or hernias Skin: no visible lesions or rashes  TDC in place    OP HD: [facility] MTThSat 3h  B350   80kg  2K  TDC  heparin  2000 - Mircera 75 mcg IV q 2 weeks (last dose 07/13/2023 next dose due 07/21/2023-did not receive) - Venofer  50 mg IV q week    Assessment/Plan: Severe AS: S/p TAVR 7/08. Stable.  Symptomatic bradycardia: S/P dual chamber leadless PPM 7/10.  ESRD: HD MWF. HD today, will try to get her on 2nd shift.   HTN: BP controlled, cont furosemide  on non HD days Volume: up 1.5kg, no vol excess on exam. 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   No results for input(s): IRON , TIBC, FERRITIN in the last 168 hours. Inpatient medications:  aspirin  EC  81 mg Oral Daily   atorvastatin   40 mg Oral Daily   Chlorhexidine  Gluconate Cloth  6 each Topical Daily   Chlorhexidine  Gluconate Cloth  6 each Topical Q0600   darbepoetin (ARANESP ) injection - DIALYSIS  40 mcg Subcutaneous Q Fri-1800   ezetimibe   10 mg Oral QHS   furosemide   40 mg Oral Once per day on Sunday Tuesday  Thursday Saturday   heparin   5,000 Units Subcutaneous Q8H   insulin  aspart  0-15 Units Subcutaneous TID WC   levothyroxine   100 mcg Oral Once per day on Sunday Tuesday Wednesday Thursday Saturday   And   levothyroxine   50 mcg Oral Once per day on Monday Friday   midodrine   5 mg Oral Q M,W,F-HD   polyethylene glycol  17 g Oral Daily   scopolamine   1 patch Transdermal Q72H   senna-docusate  1 tablet Oral QHS   sodium chloride  flush  3 mL Intravenous Q12H   sodium chloride  flush  3 mL Intravenous Q12H   sodium chloride  flush  3 mL Intravenous Q12H    sodium chloride      sodium chloride      anticoagulant sodium citrate      nitroGLYCERIN      phenylephrine  (NEO-SYNEPHRINE) Adult infusion Stopped (07/29/23 1520)   sodium chloride , sodium chloride , acetaminophen  **OR** acetaminophen , anticoagulant sodium citrate , feeding supplement (NEPRO CARB STEADY), heparin , heparin , hydrALAZINE , ipratropium-albuterol , lidocaine  (PF), lidocaine -prilocaine , lip balm, morphine  injection, mouth rinse, oxyCODONE ,  pentafluoroprop-tetrafluoroeth, sodium chloride  flush, sodium chloride  flush, sodium chloride  flush

## 2023-07-30 NOTE — Plan of Care (Signed)
  Problem: Education: Goal: Knowledge of General Education information will improve Description: Including pain rating scale, medication(s)/side effects and non-pharmacologic comfort measures Outcome: Progressing   Problem: Health Behavior/Discharge Planning: Goal: Ability to manage health-related needs will improve Outcome: Progressing   Problem: Clinical Measurements: Goal: Ability to maintain clinical measurements within normal limits will improve Outcome: Progressing Goal: Will remain free from infection Outcome: Progressing Goal: Diagnostic test results will improve Outcome: Progressing Goal: Respiratory complications will improve Outcome: Progressing Goal: Cardiovascular complication will be avoided Outcome: Progressing   Problem: Activity: Goal: Risk for activity intolerance will decrease Outcome: Progressing   Problem: Nutrition: Goal: Adequate nutrition will be maintained Outcome: Progressing   Problem: Coping: Goal: Level of anxiety will decrease Outcome: Progressing   Problem: Elimination: Goal: Will not experience complications related to bowel motility Outcome: Progressing Goal: Will not experience complications related to urinary retention Outcome: Progressing   Problem: Pain Managment: Goal: General experience of comfort will improve and/or be controlled Outcome: Progressing   Problem: Safety: Goal: Ability to remain free from injury will improve Outcome: Progressing   Problem: Skin Integrity: Goal: Risk for impaired skin integrity will decrease Outcome: Progressing   Problem: Education: Goal: Ability to describe self-care measures that may prevent or decrease complications (Diabetes Survival Skills Education) will improve Outcome: Progressing Goal: Individualized Educational Video(s) Outcome: Progressing   Problem: Coping: Goal: Ability to adjust to condition or change in health will improve Outcome: Progressing   Problem: Fluid  Volume: Goal: Ability to maintain a balanced intake and output will improve Outcome: Progressing   Problem: Health Behavior/Discharge Planning: Goal: Ability to identify and utilize available resources and services will improve Outcome: Progressing Goal: Ability to manage health-related needs will improve Outcome: Progressing   Problem: Metabolic: Goal: Ability to maintain appropriate glucose levels will improve Outcome: Progressing   Problem: Nutritional: Goal: Maintenance of adequate nutrition will improve Outcome: Progressing Goal: Progress toward achieving an optimal weight will improve Outcome: Progressing   Problem: Skin Integrity: Goal: Risk for impaired skin integrity will decrease Outcome: Progressing   Problem: Tissue Perfusion: Goal: Adequacy of tissue perfusion will improve Outcome: Progressing   Problem: Education: Goal: Understanding of CV disease, CV risk reduction, and recovery process will improve Outcome: Progressing Goal: Individualized Educational Video(s) Outcome: Progressing   Problem: Activity: Goal: Ability to return to baseline activity level will improve Outcome: Progressing   Problem: Cardiovascular: Goal: Ability to achieve and maintain adequate cardiovascular perfusion will improve Outcome: Progressing Goal: Vascular access site(s) Level 0-1 will be maintained Outcome: Progressing   Problem: Health Behavior/Discharge Planning: Goal: Ability to safely manage health-related needs after discharge will improve Outcome: Progressing   Problem: Education: Goal: Knowledge of cardiac device and self-care will improve Outcome: Progressing Goal: Ability to safely manage health related needs after discharge will improve Outcome: Progressing Goal: Individualized Educational Video(s) Outcome: Progressing   Problem: Cardiac: Goal: Ability to achieve and maintain adequate cardiopulmonary perfusion will improve Outcome: Progressing   Problem:  Education: Goal: Understanding of disease, treatment, and recovery process will improve Outcome: Progressing   Problem: Activity: Goal: Ability to return to baseline activity level will improve Outcome: Progressing   Problem: Cardiac: Goal: Ability to maintain adequate cardiovascular perfusion will improve Outcome: Progressing Goal: Vascular access site(s) Level 0-1 will be maintained Outcome: Progressing   Problem: Health Behavior/ Discharge Planning: Goal: Ability to safely manage health related needs after discharge Outcome: Progressing

## 2023-07-30 NOTE — Progress Notes (Signed)
 Discussed with pt and son TAVR restrictions, walking as tolerated, and CRPII. Pt receptive. She is interested in CRPII however it will depend on her HD schedule, which is not completely established. Will refer to [place] CRPII.  [id number] Aliene Aris BS, ACSM-CEP 07/30/2023 3:06 PM

## 2023-07-30 NOTE — Progress Notes (Signed)
 Contacted by provider that pt will possibly d/c to home tomorrow. Contacted TCU RN at [facility] to be made aware of pt's possible d/c tomorrow and that pt will hopefully resume on Monday. Clinic can resume pt's care on Monday. Will assist as needed.   Randine Mungo Renal Navigator [phone]

## 2023-07-30 NOTE — Discharge Summary (Signed)
 ELECTROPHYSIOLOGY DISCHARGE SUMMARY    [name],  MRN: [id number], DOB/AGE: [birth year] 78 y.o.  Admit date: 07/22/2023 Discharge date: 07/31/2023  Primary Care Physician: Avva, Ravisankar, MD  Primary Cardiologist: Rhonda MARLA Red, MD  Electrophysiologist: Dr. Inocencio   Primary Discharge Diagnosis:  Low-flow low gradient AS Sinus arrest  Symptomatic bradycardia  Secondary Discharge Diagnosis:  ESRD HTN HLD DM2 Anemia of chronic disease   Procedures This Admission:   Temporary Pacer placed 07/23/2023 Tunneled HD cath exchanged 07/25/2023  Cardiac CT 07/26/2023 1. Tricuspid aortic valve with severely reduced cusp excursion. Severely thickened and severely calcified aortic valve cusps. 2. Aortic valve calcium  score: 1268 3. Annulus area: 503 mm2, suitable for 26 mm Sapien 3 valve. No LVOT calcifications. Membranous septal length 8 mm. 4. Sufficient coronary artery heights from annulus. 5. Optimum fluoroscopic angle for delivery: LAO 20 CAU 12  TAVR 07/27/2023 by Dr. Verlin and Dr. Lucas.  Abbott Dual Chamber Leadless Implant 07/29/2023 by Dr. Nancey  See respective operative notes for further details.  Brief HPI: Rhonda Clayton is a 78 y.o. 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   Hospital Course:   Patient with known history of aortic stenosis that had been undergoing work-up for TAVR after being referred to Dr. Red by Nephrology for bradycardia.   Monitor in 03/2023 showed sinus pauses in the setting of trifascicular block.  She was referred to EP and seen by Dr. Nancey. Given commodities, he felt a leadless Medtronic Micra AV would be best if deemed a TAVR candidate.   Outpatient NM PET stress test  06/08/2023 demonstrated peri-infarct ischemia in the entire LAD distribution. She was then admitted from 06/12/2023 to 06/15/2023 for acute CHF. Echo showed LVEF of 35-40%, hypokinesis of the mid-apical, anterior, anterolateral and apical wall, G2DD, mod MAC with mild MR and severe LFLG AS with mean gradient 26 mm hg, AVA 0.89 cm2, DVI 0.28 and SVI 38. 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 She was then readmitted shortly after from 06/17/2023 to 06/20/2023 for recurrent CHF. She was started on HD and tunneled catheter was placed and volume managed with HD.    LHC 07/14/23 showed diffuse coronary artery disease of the mid to distal LAD, diagonal, and left circumflex systems. The lesions were relatively distal and not felt to explain the patient's cardiomyopathy. Given the lack of chest pain, plan was for medical management.    She presented to [facility] ED on 07/22/2023 for sinus pauses and presyncope and was found to have up to 8 second pauses on telemetry. Temp pacer was placed on 07/23/2023 and she was admitted to the ICU. Seen by Dr. Nancey and given need for several hours of general sedation, it was felt best to proceed with TAVR prior to leadless PPM insertion.   Patient underwent TAVR 07/27/2023 as above without complication. She was then taken for Dual Chamber Leadless PPM on the next available day which was 07/29/2023.  She was evaluated by PT/OT on 07/30/2023 and felt stable for home with HHPT. Patient and sister needed additional time to get her home ready for discharge.   Bradycardia History of trifascicular block. Admitted with syncopal episodes and found to have long symptomatic sinus pauses. Underwent TTVP on 07/23/2023 and then placement  of Abbott dual chamber leadless PPM on 07/29/2023. Device was interrogated on 07/30/2023 with normal function.  Low Flow Low Gradient Aortic Stenosis Echo in 05/2023 showed severe low flow low gradient aortic stenosis (mean gradient 26 mmHg,   AVA 0.89 cm2, DVI 0.28 and SVI 38). Underwent TAVR on 07/27/2023. Initially required Phenyleprine following surgery but this was able to be weaned off on 07/29/2023. Echo on POD #1 showed LVEF of 50-55% with normal functioning aortic valve prosthesis. Continue Aspirin  81mg  daily.  Chronic HFmrEF with Recovered EF Echo in 05/2023 showed LVEF of 35-40% with peri-apical severe hypokinesis with severe low flow low gradient AS as above. 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 LVEF 35-40% on Echo immediately post TAVR but improved to 50-55% on Echo on POD #1 on 07/28/2023. Volume status being managed via hemodialysis. GDMT limited by ESRD and soft BP. She was started on Midodrine  5mg  on hemodialysis days (M/W/F). No beta-blocker given sinus pauses.  CAD LHC in 06/2023 showed 80% stenosis of mid and 99% stenosis of distal LAD, 100% stenosis of D1, 80% stenosis of mid and 70% stenosis of distal LCX. No intervention was performed given diffuse mid to distal LAD and LCX disease and no ACS. Medical therapy was recommended. Continue Aspirin  81mg  daily and Zetia  10mg  daily. She was started on Lipitor 40mg  daily this admission as well.  Hyperlipidemia Lipid panel in 05/2023: Total Cholesterol 154, Triglycerides 99, HDL 35, LDL 99. LDL goal <70 given CAD. Home Zetia  10mg  daily was continued and she was started on Lipitor 40mg  daily. She states she already has a prescription of this at home and did not need a new prescription of this. Will need repeat lipid panel in 6-8 weeks.  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 She does not want to take a medication that involves injections either.   Type 2 Diabetes Mellitus Hemoglobin A1c 6.5% in 05/2023. Resume home regimen.  ESRD Recently started on hemodialysis in 05/2023. On hemodialysis M/ W/ F. She  underwent inpatient dialysis on Friday 07/30/2023. Our team personally confirmed with hemodialysis coordinator that patient's outpatient site will be prepared for her to dialyze on Monday 08/02/2023.  Anemia of Chronic Disease Secondary to ESRD. Baseline hemoglobin around 9 to 11. Hemoglobin stable at 8.5 on day of discharge. She was getting Aranesp  weekly while admitted. Will defer to Nephrology on whether this is needed as an outpatient.  Hypothyroidism Continue home Synthroid .  Deconditioning She was evaluated by PT/OT and home health PT arranged.  The patient was examined today by Dr. Nancey and considered to be stable for discharge.  Wound care and restrictions were reviewed with the patient.  The patient will be seen back by EP Team in 2 weeks for post hospital care. Patient also has follow-up with Structural Heart Team arranged.   Physical Exam: Vitals:   07/31/23 0700 07/31/23 0741 07/31/23 0803 07/31/23 0900  BP: 129/63  (!) 117/50 100/60  Pulse:    61  Resp: 16  18 14   Temp:  98.8 F (37.1 C)    TempSrc:  Oral    SpO2:    98%  Weight:       Discharge Medications:  Allergies as of 07/31/2023       Reactions   Coffee Flavoring Agent (non-screening) Other (See Comments)  Sick on stomach, sneezing, backed up   Azithromycin  Other (See Comments)   Fluogen [influenza Virus Vaccine]    Molds & Smuts    Allergy test showed    Pneumococcal Vac Polyvalent Other (See Comments)   Large site reaction    Tobacco Nausea And Vomiting   Versed  [midazolam ] Other (See Comments)   No memory altering medications. Patient request.   Cephalexin  Other (See Comments)   Yeast infection TOLERATED CEFAZOLIN  PRIOR   Ciprofloxacin Other (See Comments)   tendinitis   Oatmeal Other (See Comments)   Sneezing and drainage   Sulfa Antibiotics Other (See Comments)   Yeast infection        Medication List     TAKE these medications    aspirin  EC 81 MG tablet Take 1 tablet (81 mg total)  by mouth daily. Swallow whole.   atorvastatin  40 MG tablet Commonly known as: LIPITOR Take 1 tablet (40 mg total) by mouth daily.   B-D UF III MINI PEN NEEDLES 31G X 5 MM Misc Generic drug: Insulin  Pen Needle Inject into the skin 2 (two) times daily.   betamethasone valerate 0.1 % cream Commonly known as: VALISONE Apply 1 Application topically as needed (skin condition).   COQ-10 PO Take 1 tablet by mouth at bedtime.   D-MANNOSE PO Take 1 tablet by mouth at bedtime.   ezetimibe  10 MG tablet Commonly known as: ZETIA  Take 10 mg by mouth daily.   Folic Acid -Vit B6-Vit B12 2.5-25-1 MG Tabs tablet Commonly known as: FOLBEE Take 1 tablet by mouth at bedtime.   FreeStyle Libre 3 Sensor Misc change every 14 days topically to monitor blood glucose continuously 28 days   furosemide  40 MG tablet Commonly known as: LASIX  Take 1 tablet (40 mg total) by mouth See admin instructions. Take on non-dialysis days. What changed: additional instructions   midodrine  5 MG tablet Commonly known as: PROAMATINE  Take 1 tablet (5 mg total) by mouth every Monday, Wednesday, and Friday with hemodialysis. Start taking on: August 02, 2023   nitroGLYCERIN  0.4 MG SL tablet Commonly known as: NITROSTAT  Place 1 tablet (0.4 mg total) under the tongue every 5 (five) minutes as needed for chest pain.   NovoLOG  Mix 70/30 FlexPen (70-30) 100 UNIT/ML FlexPen Generic drug: insulin  aspart protamine  - aspart Inject into the skin in the morning and at bedtime. Takes 14-15 units in the morning and 9-10 units at before dinner According to how many carbohydrates she eats   OneTouch Delica Plus Lancet33G Misc Apply topically 3 (three) times daily.   OneTouch Verio test strip Generic drug: glucose blood 3 (three) times daily.   pantoprazole  40 MG tablet Commonly known as: PROTONIX  Take 1.5 tablets by mouth daily as needed (Heartburn).   Synthroid  100 MCG tablet Generic drug: levothyroxine  Take 100 mcg by  mouth See admin instructions. Takes 100 mcg daily except for Monday and Friday she takes 50 mcg               Discharge Care Instructions  (From admission, onward)           Start     Ordered   07/31/23 0000  Discharge wound care:       Comments: Per discharge summary.   07/31/23 0956            Disposition: Home with follow up as in AVS  Duration of Discharge Encounter:  APP time: 30 minutes  Signed, Chaunce Winkels E Alontae Chaloux, PA-C  07/31/2023 10:09 AM

## 2023-07-30 NOTE — Progress Notes (Signed)
 Returned back to unit from dialysis.  Received in report 2L of fluid was removed with no complications.

## 2023-07-30 NOTE — Progress Notes (Signed)
  Patient Name: Rhonda Clayton Date of Encounter: 07/30/2023  Primary Cardiologist: Arun K Thukkani, MD Electrophysiologist: Soyla Gladis Norton, MD  Interval Summary   Feeling great this am. Anxious to get up and moving and out of the hospital soon.   Vital Signs    Vitals:   07/30/23 0400 07/30/23 0500 07/30/23 0600 07/30/23 0700  BP: (!) 151/62 (!) 139/58 (!) 144/63 (!) 139/121  Pulse: 70 73 70 62  Resp: 17 15 (!) 0 18  Temp:      TempSrc:      SpO2: 100% 95% 96% 99%  Weight:  81.2 kg      Intake/Output Summary (Last 24 hours) at 07/30/2023 0838 Last data filed at 07/30/2023 0700 Gross per 24 hour  Intake 1092.82 ml  Output 775 ml  Net 317.82 ml   Filed Weights   07/28/23 0500 07/29/23 0547 07/30/23 0500  Weight: 80 kg 77 kg 81.2 kg    Physical Exam    GEN- NAD, Alert and oriented  Lungs- Clear to ausculation bilaterally, normal work of breathing Cardiac- Regular rate and rhythm, no murmurs, rubs or gallops GI- soft, NT, ND, + BS Extremities- no clubbing or cyanosis. No edema  Telemetry    AV dual pacing in 70s (personally reviewed)  Hospital Course    Rhonda Clayton is a 78 y.o. female with a hx of low-flow low gradient aortic stenosis, diabetes, hyperlipidemia, hypertension, end-stage renal disease, trifascicular block. EP consulted with intermittent pauses of up to 8 seconds.    Plan definitive management of her AS -> s/p TAVR 07/27/2023   S/p Dual Chamber Leadless PPM 7/10 by Dr. Nancey  Assessment & Plan    Sinus arrest S/p Abbott Dual Chamber Leadless PPM 7/10 .     CXR pending Device interrogation stable this am.  Follow up in place.  Wound care and restrictions reviewed with patient and in AVS.   Severe AS Multivessel CAD S/p TAVR 07/27/2023  Appreciate structural care   ESRD Dialysis per nephrology  She is stable for discharge from an EP and structural perspective, but has been bed ridden for > 1 week. Will have PT evaluate. If needs  more extensive rehab and SNF will send to floor and transfer to medicine service as per previous note 07/24/2023.  If she is otherwise stable for d/c today, will work on d/c later today. Will likely need HD prior to.    For questions or updates, please contact [facility] Please consult [email or web address] for contact info under     Signed, Ozell Prentice Passey, PA-C  07/30/2023, 8:38 AM

## 2023-07-30 NOTE — TOC Progression Note (Addendum)
 Transition of Care [facility]) - Progression Note    Patient Details  Name: Rhonda Clayton MRN: [id number] Date of Birth: 14-Dec-1945  Transition of Care [facility] CM/SW Contact  Justina Delcia Czar, RN Phone Number: [phone] 07/30/2023, 8:33 PM  Clinical Narrative:     TOC CM spoke to pt and states she lives alone. Son at bedside. States she is independent with support from her family. Has scale for daily weights. Has Living Better with HF booklet.   Will need HH RN and PT order with [facility]. Sent message to attending. Contacted [facility] rep, Darleene with resumption of care. Left message in handoff for weekend CM to follow up on HH orders.   Pt would like her meds to go to [facility].    Expected Discharge Plan: Home w Home Health Services Barriers to Discharge: Continued Medical Work up  Expected Discharge Plan and Services In-house Referral: NA Discharge Planning Services: CM Consult Post Acute Care Choice: Home Health Living arrangements for the past 2 months: Single Family Home Expected Discharge Date: 07/29/23                 DME Agency: NA       HH Arranged: RN, PT HH Agency: [facility] Date [facility] Agency Contacted: 07/30/23 Time HH Agency Contacted: 2021 Representative spoke with at [facility] Agency: Darleene   Social Determinants of Health (SDOH) Interventions SDOH Screenings   Food Insecurity: No Food Insecurity (07/23/2023)  Housing: Low Risk  (07/23/2023)  Transportation Needs: No Transportation Needs (07/23/2023)  Utilities: Not At Risk (07/23/2023)  Depression (PHQ2-9): Low Risk  (06/10/2023)  Social Connections: Patient Declined (07/23/2023)  Tobacco Use: Low Risk  (07/23/2023)    Readmission Risk Interventions    07/26/2023    4:09 PM  Readmission Risk Prevention Plan  Transportation Screening Complete  HRI or Home Care Consult Complete  Social Work Consult for Recovery Care Planning/Counseling Complete  Palliative Care Screening Not Applicable  Medication Review  Oceanographer) Referral to Pharmacy

## 2023-07-30 NOTE — Progress Notes (Addendum)
 [name], female   DOB: 04-27-1945, 78 y.o.   MRN: [id number]     Advanced Heart Failure Rounding Note  Cardiologist: Arun K Thukkani, MD   Chief Complaint: Syncope  Subjective:    Ambulated in the ICU with PT this morning.  No dyspnea. Still having some issues with nausea, improved with PRN meds.   Objective:   Weight Range: 81.2 kg Body mass index is 33.82 kg/m.   Vital Signs:   Temp:  [97.5 F (36.4 C)-97.8 F (36.6 C)] 97.8 F (36.6 C) (07/11 0300) Pulse Rate:  [61-74] 64 (07/11 1000) Resp:  [0-25] 9 (07/11 1000) BP: (95-159)/(43-123) 134/65 (07/11 1000) SpO2:  [87 %-100 %] 98 % (07/11 1000) Weight:  [81.2 kg] 81.2 kg (07/11 0500) Last BM Date : 07/29/23  Weight change: Filed Weights   07/28/23 0500 07/29/23 0547 07/30/23 0500  Weight: 80 kg 77 kg 81.2 kg    Intake/Output:   Intake/Output Summary (Last 24 hours) at 07/30/2023 1137 Last data filed at 07/30/2023 0700 Gross per 24 hour  Intake 1072.82 ml  Output 775 ml  Net 297.82 ml      Physical Exam    General:  Well appearing Neck: L internal jugular TTVP Cor: Regular rate & rhythm. No rubs, gallops or murmurs. Lungs: clear Abdomen: soft, nontender, nondistended. Extremities: no edema Neuro: alert & orientedx3. Affect pleasant   Telemetry   A paced 60, intermittently AV paced  Labs    CBC Recent Labs    07/29/23 0834 07/30/23 0218  WBC 8.4 6.4  HGB 10.1* 8.8*  HCT 31.6* 28.2*  MCV 87.5 89.0  PLT 243 214   Basic Metabolic Panel Recent Labs    92/89/74 0312 07/30/23 0218  NA 133* 132*  K 3.8 4.4  CL 102 100  CO2 20* 17*  GLUCOSE 176* 290*  BUN 27* 33*  CREATININE 3.74* 4.16*  CALCIUM  8.6* 8.5*  MG 1.7 1.8   Liver Function Tests No results for input(s): AST, ALT, ALKPHOS, BILITOT, PROT, ALBUMIN  in the last 72 hours.  No results for input(s): LIPASE, AMYLASE in the last 72 hours. Cardiac Enzymes No results for input(s): CKTOTAL, CKMB,  CKMBINDEX, TROPONINI in the last 72 hours.  BNP: BNP (last 3 results) Recent Labs    01/03/23 0925 06/12/23 1945 07/22/23 1456  BNP 709.1* 1,627.4* 1,346.7*    ProBNP (last 3 results) No results for input(s): PROBNP in the last 8760 hours.   D-Dimer No results for input(s): DDIMER in the last 72 hours. Hemoglobin A1C No results for input(s): HGBA1C in the last 72 hours. Fasting Lipid Panel No results for input(s): CHOL, HDL, LDLCALC, TRIG, CHOLHDL, LDLDIRECT in the last 72 hours. Thyroid  Function Tests No results for input(s): TSH, T4TOTAL, T3FREE, THYROIDAB in the last 72 hours.  Invalid input(s): FREET3   Other results:   Imaging    EP PPM/ICD IMPLANT Result Date: 07/29/2023 Table formatting from the original result was not included.  SURGEON:  Eulas Furbish, MD    PREPROCEDURE DIAGNOSES:  1. 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 Dual chamber permanent pacemaker implantation    INTRODUCTION: The patient is a 2you female who presents to the EP lab for placement of leadless dual chamber permanent pacemakers.    DESCRIPTION OF PROCEDURE:  Informed written consent was obtained  and the patient was brought to the electrophysiology lab in the fasting state. The patient was placed under general anesthesia by the anesthesiology service.  The patient's bilateral groins were prepped and draped in the usual sterile fashion by the EP lab staff.   Using ultrasound guidance, the right femoral vein was accessed by modified Seldinger technique. An amplatzer superstiff wire was advanced to the SVC. The RFV was dilated serially.  The Aveir access sheath advanced to the IVC RA junction.  Device placement. The right ventricular device was advanced to IVC to allow pairing with the programmer. The  device was then advanced to the RV and a good distal, septal position obtained. Positioning was confirmed with RAO and LAO views, and a right ventriculogram was performed to confirm ideal location of the device helix.  R wave, current of injury, and capture threshold were measured prior to deployment of the device.  Good fixation was confirmed, and the device was untethered.  The ventricular device delivery catheter was then removed and the atrial device sheath advanced within the introducer sheath to the IVC.  The device was then advanced high within the right atrium to the area of the RA appendage.  Contrast injections were performed to visualize the right atrial appendage. The device was advanced to the anterolateral base of the appendage.  The protective sleeve was removed.  Sensed amplitude, current of injury, and capture threshold were assessed.  With good parameters, the device was deployed in this location.  Fixation appeared to be excellent. After pairing of the RA and RV devices, the RA device was untethered and delivery sheath removed from the body.  This was a very difficult case. The total case time was approximately 4 hours. Multiple attempts were made to position the right atrial device requiring multiple redeployments. Upon our first attempt to untether the device, it dislodged, requiring us  to snare the device in the right atrium in standard fashion using the Abbott Aveir retrieval instruments. After prolonged effort, we were eventually able to obtain a good position.    RA RV Model Aveir [id number] Aveir [id number] Serial # [id number] Amplitude 2.5 mV 10.9 mV Threshold 3.25 V @ 1.0 mS 0.5 V @ 0.5 mS Impedence 280 ohms 490 ohms   Hemostasis was obtained with 2 Perclose devices a figure-of-eight suture secured with a stopcock.    CONCLUSIONS:  1. Successful placement of atrial and ventricular leadless pacemakers  3.  No early apparent complications.   Eulas Furbish, MD Cardiac Electrophysiology         Medications:     Scheduled Medications:  aspirin  EC  81 mg Oral Daily   atorvastatin   40 mg Oral Daily   Chlorhexidine  Gluconate Cloth  6 each Topical Daily   Chlorhexidine  Gluconate Cloth  6 each Topical Q0600   darbepoetin (ARANESP ) injection - DIALYSIS  40 mcg Subcutaneous Q Fri-1800   ezetimibe   10 mg Oral QHS   furosemide   40 mg Oral Once per day on Sunday Tuesday Thursday Saturday   heparin   5,000 Units Subcutaneous Q8H   insulin  aspart  0-15 Units Subcutaneous TID WC   levothyroxine   100 mcg Oral Once per day on Sunday Tuesday Wednesday Thursday Saturday   And   levothyroxine   50 mcg Oral Once per day on Monday Friday   midodrine   5 mg Oral Q M,W,F-HD   polyethylene glycol  17 g Oral Daily   scopolamine   1 patch Transdermal Q72H   senna-docusate  1 tablet Oral QHS  sodium chloride  flush  3 mL Intravenous Q12H   sodium chloride  flush  3 mL Intravenous Q12H   sodium chloride  flush  3 mL Intravenous Q12H    Infusions:  sodium chloride      sodium chloride      anticoagulant sodium citrate      nitroGLYCERIN      phenylephrine  (NEO-SYNEPHRINE) Adult infusion Stopped (07/29/23 1520)    PRN Medications: sodium chloride , sodium chloride , acetaminophen  **OR** acetaminophen , anticoagulant sodium citrate , feeding supplement (NEPRO CARB STEADY), heparin , heparin , hydrALAZINE , ipratropium-albuterol , lidocaine  (PF), lidocaine -prilocaine , lip balm, morphine  injection, mouth rinse, oxyCODONE , pentafluoroprop-tetrafluoroeth, sodium chloride  flush, sodium chloride  flush, sodium chloride  flush    Assessment/Plan   1. Bradycardia: History of trifascicular block.  Admitted with syncopal episodes and found to have long symptomatic sinus pauses.  TTVP placed.  - S/p Abbott dual chamber leadless PPM on 07/10 - Device interrogation okay this am.  2. ESRD: Recently started HD.   - HD schedule per Nephrology.  - Going for HD today  3. CAD: Diffuse CAD noted on 6/25 LHC with 80%  m and 99% d LAD stenosis, 100% D1, 80% mLCx with 70% d LCx.  No intervention given diffuse mid to distal LAD and LCx disease and no ACS.  She denies chest pain.  Plan to continue medical management for now.  - Continue ASA 81 mg daily  - Continue Zetia  10 mg daily  - Atorvastatin  40 mg added this admit.  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  She does not want to take a medication that involves injections either.   4. Aortic stenosis: Low flow/low gradient severe AS.  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 Chronic systolic CHF w/ recovered EF:  Echo in 5/25 with EF 35-40%, peri-apical severe hypokinesis, low flow/low gradient severe AS with AVA 0.89 cm^2 with mean gradient 26 mmHg. 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   - EF 35-40% on echo immediately post TAVR - EF improved to 50-55% on echo 0/7/09 - Volume controlled by iHD, takes 40 mg po lasix  on nonHD days. Does not appear overloaded today. - GDMT limited by ESRD - added 5 mg midodrine  on HD days. BP improved post TAVR, however; did require midodrine  during last HD - Planning HD today - no ? blocker w/ sinus pauses   Transferring to progressive.  Advanced Heart Failure will no longer follow out of the ICU. She does not need AHF clinic folllow-up.  Length of Stay: 8  FINCH, LINDSAY N, PA-C  07/30/2023, 11:37 AM  Advanced Heart Failure Team Pager [phone] (M-F; 7a - 5p)  Please contact [facility] for night-coverage after hours (5p -7a ) and weekends on [email or web address]   Patient seen and examined with the above-signed Advanced Practice Provider and/or Housestaff. I personally reviewed laboratory data, imaging studies and relevant notes. I independently examined the patient and formulated the important aspects  of the plan. I have edited the note to reflect any of my changes or salient points. I have personally discussed the plan with the patient and/or family.  Underwent PPM yesterday.  Feels weak today but otherwise ok.   Pacer working well.   Planning for HD later today  General:  Sitting up in bed  No resp difficulty HEENT: normal Neck: supple. no  JVD. Carotids 2+ bilat; no bruits. No lymphadenopathy or thryomegaly appreciated. Cor Regular rate & rhythm. No rubs, gallops or murmurs. Lungs: clear Abdomen: soft, nontender, nondistended. No hepatosplenomegaly. No bruits or masses. Good bowel sounds. Extremities: no cyanosis, clubbing, rash, edema Neuro: alert & orientedx3, cranial nerves grossly intact. moves all 4 extremities w/o difficulty. Affect pleasant  Much improved s/p TAVR and PPM. GDMT limited by low BP. Continue midodrine  as needed for HD.  Can got to floor with [facility]/EP .  Toribio Fuel, MD  4:45 PM

## 2023-07-31 LAB — BASIC METABOLIC PANEL WITH GFR
Anion gap: 3 — ABNORMAL LOW (ref 5–15)
BUN: 26 mg/dL — ABNORMAL HIGH (ref 8–23)
CO2: 29 mmol/L (ref 22–32)
Calcium: 8.1 mg/dL — ABNORMAL LOW (ref 8.9–10.3)
Chloride: 101 mmol/L (ref 98–111)
Creatinine, Ser: 3.63 mg/dL — ABNORMAL HIGH (ref 0.44–1.00)
GFR, Estimated: 12 mL/min — ABNORMAL LOW (ref >60)
Glucose, Bld: 275 mg/dL — ABNORMAL HIGH (ref 70–99)
Potassium: 3.8 mmol/L (ref 3.5–5.1)
Sodium: 133 mmol/L — ABNORMAL LOW (ref 135–145)

## 2023-07-31 LAB — CBC
HCT: 26.7 % — ABNORMAL LOW (ref 36.0–46.0)
Hemoglobin: 8.5 g/dL — ABNORMAL LOW (ref 12.0–15.0)
MCH: 28.1 pg (ref 26.0–34.0)
MCHC: 31.8 g/dL (ref 30.0–36.0)
MCV: 88.4 fL (ref 80.0–100.0)
Platelets: 234 K/uL (ref 150–400)
RBC: 3.02 MIL/uL — ABNORMAL LOW (ref 3.87–5.11)
RDW: 15.9 % — ABNORMAL HIGH (ref 11.5–15.5)
WBC: 7.5 K/uL (ref 4.0–10.5)
nRBC: 0 % (ref 0.0–0.2)

## 2023-07-31 LAB — GLUCOSE, CAPILLARY: Glucose-Capillary: 235 mg/dL — ABNORMAL HIGH (ref 70–99)

## 2023-07-31 MED ORDER — FUROSEMIDE 40 MG PO TABS: 40.0000 mg | ORAL_TABLET | ORAL | Status: AC

## 2023-07-31 MED ORDER — MIDODRINE HCL 5 MG PO TABS: 5.0000 mg | ORAL_TABLET | ORAL | 2 refills | Status: DC

## 2023-07-31 MED ORDER — ATORVASTATIN CALCIUM 40 MG PO TABS: 40.0000 mg | ORAL_TABLET | Freq: Every day | ORAL | Status: DC

## 2023-07-31 NOTE — Plan of Care (Signed)
  Problem: Education: Goal: Knowledge of General Education information will improve Description: Including pain rating scale, medication(s)/side effects and non-pharmacologic comfort measures Outcome: Progressing   Problem: Health Behavior/Discharge Planning: Goal: Ability to manage health-related needs will improve Outcome: Progressing   Problem: Clinical Measurements: Goal: Ability to maintain clinical measurements within normal limits will improve Outcome: Progressing Goal: Will remain free from infection Outcome: Progressing Goal: Diagnostic test results will improve Outcome: Progressing Goal: Respiratory complications will improve Outcome: Progressing Goal: Cardiovascular complication will be avoided Outcome: Progressing   Problem: Activity: Goal: Risk for activity intolerance will decrease Outcome: Progressing   Problem: Nutrition: Goal: Adequate nutrition will be maintained Outcome: Progressing   Problem: Coping: Goal: Level of anxiety will decrease Outcome: Progressing   Problem: Elimination: Goal: Will not experience complications related to bowel motility Outcome: Progressing Goal: Will not experience complications related to urinary retention Outcome: Progressing   Problem: Pain Managment: Goal: General experience of comfort will improve and/or be controlled Outcome: Progressing   Problem: Safety: Goal: Ability to remain free from injury will improve Outcome: Progressing   Problem: Skin Integrity: Goal: Risk for impaired skin integrity will decrease Outcome: Progressing   Problem: Education: Goal: Ability to describe self-care measures that may prevent or decrease complications (Diabetes Survival Skills Education) will improve Outcome: Progressing Goal: Individualized Educational Video(s) Outcome: Progressing   Problem: Coping: Goal: Ability to adjust to condition or change in health will improve Outcome: Progressing   Problem: Fluid  Volume: Goal: Ability to maintain a balanced intake and output will improve Outcome: Progressing   Problem: Health Behavior/Discharge Planning: Goal: Ability to identify and utilize available resources and services will improve Outcome: Progressing Goal: Ability to manage health-related needs will improve Outcome: Progressing   Problem: Metabolic: Goal: Ability to maintain appropriate glucose levels will improve Outcome: Progressing   Problem: Nutritional: Goal: Maintenance of adequate nutrition will improve Outcome: Progressing Goal: Progress toward achieving an optimal weight will improve Outcome: Progressing   Problem: Skin Integrity: Goal: Risk for impaired skin integrity will decrease Outcome: Progressing   Problem: Tissue Perfusion: Goal: Adequacy of tissue perfusion will improve Outcome: Progressing   Problem: Education: Goal: Understanding of CV disease, CV risk reduction, and recovery process will improve Outcome: Progressing Goal: Individualized Educational Video(s) Outcome: Progressing   Problem: Activity: Goal: Ability to return to baseline activity level will improve Outcome: Progressing   Problem: Cardiovascular: Goal: Ability to achieve and maintain adequate cardiovascular perfusion will improve Outcome: Progressing Goal: Vascular access site(s) Level 0-1 will be maintained Outcome: Progressing   Problem: Health Behavior/Discharge Planning: Goal: Ability to safely manage health-related needs after discharge will improve Outcome: Progressing   Problem: Education: Goal: Knowledge of cardiac device and self-care will improve Outcome: Progressing Goal: Ability to safely manage health related needs after discharge will improve Outcome: Progressing Goal: Individualized Educational Video(s) Outcome: Progressing   Problem: Cardiac: Goal: Ability to achieve and maintain adequate cardiopulmonary perfusion will improve Outcome: Progressing   Problem:  Education: Goal: Understanding of disease, treatment, and recovery process will improve Outcome: Progressing   Problem: Activity: Goal: Ability to return to baseline activity level will improve Outcome: Progressing   Problem: Cardiac: Goal: Ability to maintain adequate cardiovascular perfusion will improve Outcome: Progressing Goal: Vascular access site(s) Level 0-1 will be maintained Outcome: Progressing   Problem: Health Behavior/ Discharge Planning: Goal: Ability to safely manage health related needs after discharge Outcome: Progressing

## 2023-07-31 NOTE — Progress Notes (Signed)
 Patient, Rhonda Clayton, and son presented with the after visit summery and education provided. All medications, post-operative instructions, and questions addressed after reviewing all information included in the packet. Patient was then removed from the monitors after a final vitals check and dressed in paper scrubs with all belongings returned to her. Room was checked for a final time for any personal belongings before being transported to her ride via wheelchair. No incidences while getting into the vehicle and patient was fully discharged.  Raguel Natasa Stigall RN 07/31/23 1100

## 2023-07-31 NOTE — Discharge Planning (Signed)
 [facility] Patient Discharge Orders- [facility]: [facility]  Patient's name: Rhonda Clayton Admit/DC Dates: 07/22/2023 - 07/31/2023  Discharge Diagnoses: Bradycardia/SSS --s/p PPM placement  Aortic stenosis -s/p TAVR   Midodrine  added for BP support with HD  Recent Labs  Lab 07/31/23 0333  HGB 8.5*  K 3.8  CALCIUM  8.1*   Outpatient Dialysis Orders:  -Heparin : No change  -EDW 79.5  kg   -[place]: No change   Anemia Orders Aranesp : Given: Y  Date of last dose/amount: 7/4  40 mcg   PRBC's Given: -- Date/# of units: -- ESA dose for discharge:  Mircera 75 mcg IV q 2 weeks. Due 7/14  Access intervention/Change: --   OTHER/APPTS/LABS   CODE STATUS: DNR    Completed by: Maisie Ronnald Acosta PA-C   D/C Meds to be reconciled by nurse after every discharge.    Reviewed by: MD:______ RN_______

## 2023-07-31 NOTE — Plan of Care (Signed)
 Problem: Education: Goal: Knowledge of General Education information will improve Description: Including pain rating scale, medication(s)/side effects and non-pharmacologic comfort measures Outcome: Adequate for Discharge   Problem: Health Behavior/Discharge Planning: Goal: Ability to manage health-related needs will improve Outcome: Adequate for Discharge   Problem: Clinical Measurements: Goal: Ability to maintain clinical measurements within normal limits will improve Outcome: Adequate for Discharge Goal: Will remain free from infection Outcome: Adequate for Discharge Goal: Diagnostic test results will improve Outcome: Adequate for Discharge Goal: Respiratory complications will improve Outcome: Adequate for Discharge Goal: Cardiovascular complication will be avoided Outcome: Adequate for Discharge   Problem: Activity: Goal: Risk for activity intolerance will decrease Outcome: Adequate for Discharge   Problem: Nutrition: Goal: Adequate nutrition will be maintained Outcome: Adequate for Discharge   Problem: Coping: Goal: Level of anxiety will decrease Outcome: Adequate for Discharge   Problem: Elimination: Goal: Will not experience complications related to bowel motility Outcome: Adequate for Discharge Goal: Will not experience complications related to urinary retention Outcome: Adequate for Discharge   Problem: Pain Managment: Goal: General experience of comfort will improve and/or be controlled Outcome: Adequate for Discharge   Problem: Safety: Goal: Ability to remain free from injury will improve Outcome: Adequate for Discharge   Problem: Skin Integrity: Goal: Risk for impaired skin integrity will decrease Outcome: Adequate for Discharge   Problem: Education: Goal: Ability to describe self-care measures that may prevent or decrease complications (Diabetes Survival Skills Education) will improve Outcome: Adequate for Discharge Goal: Individualized Educational  Video(s) Outcome: Adequate for Discharge   Problem: Coping: Goal: Ability to adjust to condition or change in health will improve Outcome: Adequate for Discharge   Problem: Fluid Volume: Goal: Ability to maintain a balanced intake and output will improve Outcome: Adequate for Discharge   Problem: Health Behavior/Discharge Planning: Goal: Ability to identify and utilize available resources and services will improve Outcome: Adequate for Discharge Goal: Ability to manage health-related needs will improve Outcome: Adequate for Discharge   Problem: Metabolic: Goal: Ability to maintain appropriate glucose levels will improve Outcome: Adequate for Discharge   Problem: Nutritional: Goal: Maintenance of adequate nutrition will improve Outcome: Adequate for Discharge Goal: Progress toward achieving an optimal weight will improve Outcome: Adequate for Discharge   Problem: Skin Integrity: Goal: Risk for impaired skin integrity will decrease Outcome: Adequate for Discharge   Problem: Tissue Perfusion: Goal: Adequacy of tissue perfusion will improve Outcome: Adequate for Discharge   Problem: Education: Goal: Understanding of CV disease, CV risk reduction, and recovery process will improve Outcome: Adequate for Discharge Goal: Individualized Educational Video(s) Outcome: Adequate for Discharge   Problem: Activity: Goal: Ability to return to baseline activity level will improve Outcome: Adequate for Discharge   Problem: Cardiovascular: Goal: Ability to achieve and maintain adequate cardiovascular perfusion will improve Outcome: Adequate for Discharge Goal: Vascular access site(s) Level 0-1 will be maintained Outcome: Adequate for Discharge   Problem: Health Behavior/Discharge Planning: Goal: Ability to safely manage health-related needs after discharge will improve Outcome: Adequate for Discharge   Problem: Education: Goal: Knowledge of cardiac device and self-care will  improve Outcome: Adequate for Discharge Goal: Ability to safely manage health related needs after discharge will improve Outcome: Adequate for Discharge Goal: Individualized Educational Video(s) Outcome: Adequate for Discharge   Problem: Cardiac: Goal: Ability to achieve and maintain adequate cardiopulmonary perfusion will improve Outcome: Adequate for Discharge   Problem: Education: Goal: Understanding of disease, treatment, and recovery process will improve Outcome: Adequate for Discharge   Problem: Activity: Goal: Ability  to return to baseline activity level will improve Outcome: Adequate for Discharge   Problem: Cardiac: Goal: Ability to maintain adequate cardiovascular perfusion will improve Outcome: Adequate for Discharge Goal: Vascular access site(s) Level 0-1 will be maintained Outcome: Adequate for Discharge   Problem: Health Behavior/ Discharge Planning: Goal: Ability to safely manage health related needs after discharge Outcome: Adequate for Discharge

## 2023-08-02 ENCOUNTER — Telehealth (HOSPITAL_COMMUNITY): Payer: Self-pay | Admitting: Nephrology

## 2023-08-02 ENCOUNTER — Telehealth: Payer: Self-pay | Admitting: *Deleted

## 2023-08-02 ENCOUNTER — Telehealth: Payer: Self-pay

## 2023-08-02 DIAGNOSIS — E039 Hypothyroidism, unspecified: Secondary | ICD-10-CM | POA: Diagnosis not present

## 2023-08-02 DIAGNOSIS — N816 Rectocele: Secondary | ICD-10-CM | POA: Diagnosis not present

## 2023-08-02 DIAGNOSIS — K222 Esophageal obstruction: Secondary | ICD-10-CM | POA: Diagnosis not present

## 2023-08-02 DIAGNOSIS — N8110 Cystocele, unspecified: Secondary | ICD-10-CM | POA: Diagnosis not present

## 2023-08-02 DIAGNOSIS — N185 Chronic kidney disease, stage 5: Secondary | ICD-10-CM | POA: Diagnosis not present

## 2023-08-02 DIAGNOSIS — I5043 Acute on chronic combined systolic (congestive) and diastolic (congestive) heart failure: Secondary | ICD-10-CM | POA: Diagnosis not present

## 2023-08-02 DIAGNOSIS — I132 Hypertensive heart and chronic kidney disease with heart failure and with stage 5 chronic kidney disease, or end stage renal disease: Secondary | ICD-10-CM | POA: Diagnosis not present

## 2023-08-02 DIAGNOSIS — Z794 Long term (current) use of insulin: Secondary | ICD-10-CM | POA: Diagnosis not present

## 2023-08-02 DIAGNOSIS — E1122 Type 2 diabetes mellitus with diabetic chronic kidney disease: Secondary | ICD-10-CM | POA: Diagnosis not present

## 2023-08-02 DIAGNOSIS — N186 End stage renal disease: Secondary | ICD-10-CM | POA: Diagnosis not present

## 2023-08-02 DIAGNOSIS — I2510 Atherosclerotic heart disease of native coronary artery without angina pectoris: Secondary | ICD-10-CM | POA: Diagnosis not present

## 2023-08-02 DIAGNOSIS — Z992 Dependence on renal dialysis: Secondary | ICD-10-CM | POA: Diagnosis not present

## 2023-08-02 DIAGNOSIS — I452 Bifascicular block: Secondary | ICD-10-CM | POA: Diagnosis not present

## 2023-08-02 NOTE — Telephone Encounter (Signed)
 Patient contacted regarding discharge from [facility] on 07/31/2023.  Patient understands to follow up with provider Izetta Hummer PA-C on 08/05/2023 at 3:35 PM at [address] location. Patient understands discharge instructions? yes Patient understands medications and regiment? yes Patient understands to bring all medications to this visit? yes

## 2023-08-02 NOTE — Telephone Encounter (Signed)
 Contacted Hedda char, Cory with follow up on Home Health. Verbal orders received on Friday, 07/30/2023 to resume Home Health. Delcia Crick RN3 CCM, Heart Failure TOC CM [phone]

## 2023-08-02 NOTE — Telephone Encounter (Signed)
 Transition of Care - Initial Contact after Hospitalization  Date of discharge: 07/31/23 Date of contact: 08/02/23  Method: Phone Spoke to: Patient and dialysis RN  Patient contacted to discuss transition of care from recent inpatient hospitalization. Patient was admitted to [facility] from 7/3-7/12/25 with bradycardia and hypotension, s/p TAVR and pacemaker.  The discharge medication list was reviewed. Patient understands the changes and has no concerns. Midodrine  added.  Patient will return to his/her outpatient HD unit: today - she is there now. Spoke to HD RN as well - BP holding today.  No other concerns at this time.  Izetta Boehringer, PA-C [employer] Pager [phone]

## 2023-08-02 NOTE — Progress Notes (Signed)
 Late Note Entry- August 02, 2023  Pt d/c on Saturday. Contacted TCU RN this morning to be advised of pt's d/c date and that pt should resume care today.  Randine Mungo Renal Navigator [phone]

## 2023-08-03 MED FILL — Fentanyl Citrate Preservative Free (PF) Inj 100 MCG/2ML: INTRAMUSCULAR | Qty: 2 | Status: CN

## 2023-08-04 ENCOUNTER — Ambulatory Visit (HOSPITAL_COMMUNITY)

## 2023-08-04 MED FILL — Fentanyl Citrate Preservative Free (PF) Inj 100 MCG/2ML: INTRAMUSCULAR | Qty: 2 | Status: AC

## 2023-08-05 ENCOUNTER — Ambulatory Visit: Attending: Physician Assistant | Admitting: Physician Assistant

## 2023-08-05 ENCOUNTER — Telehealth (HOSPITAL_COMMUNITY): Payer: Self-pay

## 2023-08-05 VITALS — BP 138/56 | HR 67 | Ht 61.0 in | Wt 176.8 lb

## 2023-08-05 DIAGNOSIS — D649 Anemia, unspecified: Secondary | ICD-10-CM | POA: Diagnosis not present

## 2023-08-05 DIAGNOSIS — Z950 Presence of cardiac pacemaker: Secondary | ICD-10-CM

## 2023-08-05 DIAGNOSIS — I2510 Atherosclerotic heart disease of native coronary artery without angina pectoris: Secondary | ICD-10-CM

## 2023-08-05 DIAGNOSIS — N186 End stage renal disease: Secondary | ICD-10-CM | POA: Diagnosis not present

## 2023-08-05 DIAGNOSIS — I5032 Chronic diastolic (congestive) heart failure: Secondary | ICD-10-CM | POA: Diagnosis not present

## 2023-08-05 DIAGNOSIS — Z952 Presence of prosthetic heart valve: Secondary | ICD-10-CM

## 2023-08-05 MED ORDER — AMOXICILLIN 500 MG PO TABS: 2000.0000 mg | ORAL_TABLET | ORAL | 12 refills | Status: DC

## 2023-08-05 NOTE — Progress Notes (Unsigned)
 [name], female   DOB: [birth year], 78 y.o.   MRN: [id number]  Reason for Consult: Routine Post Op   Referred by [name], MD  Subjective:     HPI Rhonda Clayton is a 78 y.o. female who presents for evaluation HD access creation.  She has a complex medical history recently and was recently started on HD.  Tunneled catheter was placed and we have deferred any arm access given the severity of her medical illnesses.  She also recently underwent a TAVR on 07/27/2023.  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 Verlin and Dr. Lucas. No date: Stress incontinence No date: Torn rotator cuff     Comment:  Bilateral No date: Type 2 diabetes mellitus (HCC) No date: Venous insufficiency 05/25/2019: Vitreous hemorrhage, left (HCC)     Comment:  The nature of the vitreous hemorrhage was discussed with              the patient as well as the common causes.   Patients with              diabetic eye disease may develop retinal               neovascularization.  Other eye conditions develop retinal              neovascularization secondary to retinal venous               occlusions.   Vitreous hemorrhage may result from               spontaneous vitreous detachment or retinal breaks.  Blunt              trauma is a common cause as wl  Family History  Problem Relation Age of Onset  . Multiple  myeloma Mother   . Cancer Maternal Aunt        Cancer in the mouth - dipped snuff  . Heart disease Brother   . Diabetes Father   . Colon polyps Brother   . Heart disease Maternal Uncle   . Heart disease Paternal Uncle   . Heart disease Paternal Grandfather    Past Surgical History:  Procedure Laterality Date  . ABDOMINAL AORTOGRAM N/A 07/14/2023   Procedure: ABDOMINAL AORTOGRAM;  Surgeon: Wendel Lurena POUR, MD;  Location: [facility] INVASIVE CV LAB;  Service: Cardiovascular;  Laterality: N/A;  . ABDOMINAL EXPOSURE N/A 05/09/2014   Procedure: ABDOMINAL EXPOSURE;  Surgeon: Krystal JULIANNA Doing, MD;  Location: [facility] NEURO ORS;  Service: Vascular;  Laterality: N/A;  . ANTERIOR LAT LUMBAR FUSION Right 05/09/2014   Procedure: Lumbar three-four, Lumbar four-five Anterior lateral lumbar fusion;  Surgeon: Fairy Levels, MD;  Location: [facility] NEURO ORS;  Service: Neurosurgery;  Laterality: Right;  . ANTERIOR LUMBAR FUSION N/A 05/09/2014   Procedure: Stage 1:Lumbar four-five, Lumbar five-Sacral one Anterior lumbar interbody fusion  with Dr. Oris for approach ;  Surgeon: Fairy Levels, MD;  Location: [facility] NEURO ORS;  Service: Neurosurgery;  Laterality: N/A;  . CHOLECYSTECTOMY  1987  . INCONTINENCE SURGERY    . INSERTION OF DIALYSIS CATHETER Right 06/17/2023   Procedure: INSERTION OF DIALYSIS CATHETER;  Surgeon: Pearline Norman RAMAN, MD;  Location: [facility] OR;  Service: Vascular;  Laterality: Right;  . INTRAOPERATIVE TRANSTHORACIC ECHOCARDIOGRAM N/A 07/27/2023   Procedure: ECHOCARDIOGRAM, TRANSTHORACIC;  Surgeon: Verlin Lonni BIRCH, MD;  Location: [facility] INVASIVE CV LAB;  Service: Cardiovascular;  Laterality: N/A;  . LEFT HEART CATH AND CORONARY ANGIOGRAPHY N/A 07/14/2023   Procedure: LEFT HEART CATH AND CORONARY ANGIOGRAPHY;  Surgeon: Wendel Lurena POUR, MD;  Location: [facility] INVASIVE CV LAB;  Service: Cardiovascular;  Laterality: N/A;  . LUMBAR PERCUTANEOUS PEDICLE SCREW 3 LEVEL N/A 05/10/2014   Procedure: Stage 2: Percutaneous Pedicle Screws;  Laminectomy L3-4  ;  Surgeon: Fairy Levels, MD;  Location: [facility] NEURO ORS;  Service: Neurosurgery;  Laterality: N/A;  Stage 2: Percutaneous Pedicle Screws T10 to Ileum; Laminectomy L3-4    . PACEMAKER LEADLESS INSERTION N/A 07/29/2023   Procedure: PACEMAKER LEADLESS INSERTION;  Surgeon: Nancey Eulas BRAVO, MD;  Location: [facility] INVASIVE CV LAB;  Service: Cardiovascular;  Laterality: N/A;  . RECTAL SURGERY    . TEMPORARY PACEMAKER N/A 07/23/2023   Procedure: TEMPORARY PACEMAKER;  Surgeon: Verlin Lonni BIRCH, MD;  Location: [facility] INVASIVE CV LAB;  Service: Cardiovascular;  Laterality: N/A;  . TUNNELLED CATHETER EXCHANGE N/A 07/22/2023   Procedure: TUNNELLED CATHETER EXCHANGE;  Surgeon: Serene Gaile ORN, MD;  Location: [facility] LAB;  Service: Cardiovascular;  Laterality: N/A;  . VAGINA SURGERY  2018   Almetta Planas Touch- Vaginal Dryness    Short Social History:  Social History   Tobacco Use  . Smoking status: Never  . Smokeless tobacco: Never  Substance Use Topics  . Alcohol  use: No    Allergies  Allergen Reactions  . Coffee Flavoring Agent (Non-Screening) Other (See Comments)    Sick on stomach, sneezing, backed up  . Azithromycin  Other (See Comments)  . Fluogen [Influenza Virus Vaccine]   . Molds & Smuts     Allergy test showed   . Pneumococcal Vac Polyvalent Other (See Comments)    Large site reaction   . Tobacco Nausea And Vomiting  . Versed  [Midazolam ] Other (See Comments)    No memory altering medications. Patient request.  . Cephalexin  Other (See Comments)    Yeast infection TOLERATED CEFAZOLIN  PRIOR  . Ciprofloxacin Other (See Comments)    tendinitis  . Oatmeal Other (See Comments)    Sneezing and drainage  . Sulfa Antibiotics Other (See Comments)    Yeast infection    Current Outpatient Medications  Medication Sig Dispense Refill  . amoxicillin  (AMOXIL ) 500 MG tablet Take 4 tablets (2,000 mg total) by mouth as directed. 1 HOUR PRIOR TO DENTAL PROCEDURES, INCLUDING CLEANINGS  12 tablet 12  . aspirin  EC 81 MG tablet Take 1 tablet (81 mg total) by mouth daily. Swallow whole. 30 tablet 12  . atorvastatin  (LIPITOR) 40 MG tablet Take 1 tablet (40 mg total) by mouth daily.    . B-D UF III MINI PEN NEEDLES 31G X 5 MM MISC Inject into the skin 2 (two) times daily.    . betamethasone valerate (VALISONE) 0.1 % cream Apply 1 Application topically as needed (skin condition).    . Coenzyme Q10 (COQ-10 PO) Take 1 tablet by mouth at bedtime.    . Continuous Glucose Sensor (  FREESTYLE LIBRE 3 SENSOR) MISC change every 14 days topically to monitor blood glucose continuously 28 days    . D-MANNOSE PO Take 1 tablet by mouth at bedtime.    . ezetimibe  (ZETIA ) 10 MG tablet Take 10 mg by mouth daily.    . Folic Acid -Vit B6-Vit B12 (FOLBEE) 2.5-25-1 MG TABS tablet Take 1 tablet by mouth at bedtime.    . furosemide  (LASIX ) 40 MG tablet Take 1 tablet (40 mg total) by mouth See admin instructions. Take on non-dialysis days.    . Lancets (ONETOUCH DELICA PLUS LANCET33G) MISC Apply topically 3 (three) times daily.    . levothyroxine  (SYNTHROID ) 100 MCG tablet Take 100 mcg by mouth See admin instructions. Takes 100 mcg daily except for Monday and Friday she takes 50 mcg    . midodrine  (PROAMATINE ) 5 MG tablet Take 1 tablet (5 mg total) by mouth every Monday, Wednesday, and Friday with hemodialysis. 15 tablet 2  . nitroGLYCERIN  (NITROSTAT ) 0.4 MG SL tablet Place 1 tablet (0.4 mg total) under the tongue every 5 (five) minutes as needed for chest pain. 25 tablet 6  . NOVOLOG  MIX 70/30 FLEXPEN (70-30) 100 UNIT/ML FlexPen Inject into the skin in the morning and at bedtime. Takes 14-15 units in the morning and 9-10 units at before dinner According to how many carbohydrates she eats    . ONETOUCH VERIO test strip 3 (three) times daily.    . pantoprazole  (PROTONIX ) 40 MG tablet Take 1.5 tablets by mouth daily as needed (Heartburn).     No current facility-administered medications for this visit.     REVIEW OF SYSTEMS All other systems were reviewed and are negative    Objective:  Objective   Vitals:   08/06/23 1400  BP: (!) 104/50  Pulse: 65  Resp: 18  Temp: 97.9 F (36.6 C)  TempSrc: Temporal  SpO2: 98%  Weight: 174 lb 8 oz (79.2 kg)  Height: 5' 1 (1.549 m)   Body mass index is 32.97 kg/m.  Physical Exam General: no acute distress Cardiac: hemodynamically stable Pulm: normal work of breathing Neuro: alert, no focal deficit Vascular:   Right: palpable brachial, radial  Left: palpable brachial, radial   Data: UE arterial duplex Right Pre-Dialysis Findings:  +-----------------------+----------+--------------------+--------+--------+   Location              PSV (cm/s)Intralum. Diam.  (cm)WaveformComments  +-----------------------+----------+--------------------+--------+--------+   Brachial Antecub. fossa53        0.48                biphasic           +-----------------------+----------+--------------------+--------+--------+   Radial Art at Wrist    72        0.30                biphasic           +-----------------------+----------+--------------------+--------+--------+   Ulnar Art at Wrist     64        0.11                biphasic           +-----------------------+----------+--------------------+--------+--------+    Left Pre-Dialysis Findings:  +-----------------------+----------+--------------------+--------+--------+   Location              PSV (cm/s)Intralum. Diam.  (cm)WaveformComments  +-----------------------+----------+--------------------+--------+--------+   Brachial Antecub. fossa63        0.52                biphasic           +-----------------------+----------+--------------------+--------+--------+  Radial Art at Wrist    65        0.21                biphasic           +-----------------------+----------+--------------------+--------+--------+   Ulnar Art at Wrist     57         0.25                biphasic           +-----------------------+----------+--------------------+--------+--------+      Vein mapping +-----------------+-------------+----------+---------+  Right Cephalic   Diameter (cm)Depth (cm)Findings   +-----------------+-------------+----------+---------+  Prox upper arm       0.35        1.17              +-----------------+-------------+----------+---------+  Mid upper arm        0.37        0.83              +-----------------+-------------+----------+---------+  Dist upper arm       0.42        0.62              +-----------------+-------------+----------+---------+  Antecubital fossa    0.65        0.13   branching  +-----------------+-------------+----------+---------+  Prox forearm         0.28        0.28   branching  +-----------------+-------------+----------+---------+  Mid forearm          0.34        0.31              +-----------------+-------------+----------+---------+  Dist forearm         0.32        0.11              +-----------------+-------------+----------+---------+   +-----------------+-------------+----------+---------+  Right Basilic    Diameter (cm)Depth (cm)Findings   +-----------------+-------------+----------+---------+  Prox upper arm       1.04        1.25              +-----------------+-------------+----------+---------+  Mid upper arm        0.44        1.84              +-----------------+-------------+----------+---------+  Dist upper arm       0.38        1.80              +-----------------+-------------+----------+---------+  Antecubital fossa    0.26        0.38              +-----------------+-------------+----------+---------+  Prox forearm         0.26        0.23   branching  +-----------------+-------------+----------+---------+  Mid forearm          0.11        0.16               +-----------------+-------------+----------+---------+  Distal forearm       0.09        0.11              +-----------------+-------------+----------+---------+   +-----------------+-------------+----------+--------+  Left Cephalic    Diameter (cm)Depth (cm)Findings  +-----------------+-------------+----------+--------+  Prox upper arm       0.36        1.54             +-----------------+-------------+----------+--------+  Mid upper arm        0.21        0.72             +-----------------+-------------+----------+--------+  Dist upper arm       0.47        0.23             +-----------------+-------------+----------+--------+  Antecubital fossa    0.26        0.15             +-----------------+-------------+----------+--------+  Prox forearm         0.13        0.28             +-----------------+-------------+----------+--------+  Mid forearm          0.10        0.03             +-----------------+-------------+----------+--------+  Dist forearm         0.22        0.14             +-----------------+-------------+----------+--------+   +-----------------+-------------+----------+---------+  Left Basilic     Diameter (cm)Depth (cm)Findings   +-----------------+-------------+----------+---------+  Prox upper arm       0.42        1.32              +-----------------+-------------+----------+---------+  Mid upper arm        0.55        1.32              +-----------------+-------------+----------+---------+  Dist upper arm       0.30        0.64              +-----------------+-------------+----------+---------+  Antecubital fossa    0.27        0.42              +-----------------+-------------+----------+---------+  Prox forearm         0.28        0.50   branching  +-----------------+-------------+----------+---------+  Mid forearm          0.28        0.31               +-----------------+-------------+----------+---------+  Distal forearm       0.16        0.11              +-----------------+-------------+----------+---------+    Note from nephrologist reviewed AVF now, weight on AVG      Assessment/Plan:     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  Dominant hand: right Previous access surgeries: none Previous catheters: right IJ Currently dialyzing via RIJ TDC Other arm surgeries/injuries: none   I explained that she is a candidate for left arm AV access creation.   At this time she would like to pursue PD and has been referred to a surgeon at [facility] health for peritoneal dialysis catheter insertion.  She had questions about peritoneal dialysis and the success rate and duration which I answered.  We discussed that if she has issues in the future and is needing hemodialysis then she will call for follow-up.     Norman GORMAN Serve MD [facility]

## 2023-08-05 NOTE — Patient Instructions (Signed)
 Medication Instructions:  Your physician recommends that you continue on your current medications as directed. Please refer to the Current Medication list given to you today.  Start Amoxicillin  500 mg, take 4 tablets by mouth 1 hour prior to dental procedures and cleanings.   *If you need a refill on your cardiac medications before your next appointment, please call your pharmacy*  Lab Work: None needed If you have labs (blood work) drawn today and your tests are completely normal, you will receive your results only by: MyChart Message (if you have MyChart) OR A paper copy in the mail If you have any lab test that is abnormal or we need to change your treatment, we will call you to review the results.  Testing/Procedures: 08/2023 Your physician has requested that you have an echocardiogram. Echocardiography is a painless test that uses sound waves to create images of your heart. It provides your doctor with information about the size and shape of your heart and how well your heart's chambers and valves are working. This procedure takes approximately one hour. There are no restrictions for this procedure. Please do NOT wear cologne, perfume, aftershave, or lotions (deodorant is allowed). Please arrive 15 minutes prior to your appointment time.  Please note: We ask at that you not bring children with you during ultrasound (echo/ vascular) testing. Due to room size and safety concerns, children are not allowed in the ultrasound rooms during exams. Our front office staff cannot provide observation of children in our lobby area while testing is being conducted. An adult accompanying a patient to their appointment will only be allowed in the ultrasound room at the discretion of the ultrasound technician under special circumstances. We apologize for any inconvenience.   Follow-Up: At [facility], you and your health needs are our priority.  As part of our continuing mission to provide you with  exceptional heart care, our providers are all part of one team.  This team includes your primary Cardiologist (physician) and Advanced Practice Providers or APPs (Physician Assistants and Nurse Practitioners) who all work together to provide you with the care you need, when you need it.  Your next appointment:   As scheduled  Provider:   Izetta Hummer, PA-C    We recommend signing up for the patient portal called MyChart.  Sign up information is provided on this After Visit Summary.  MyChart is used to connect with patients for Virtual Visits (Telemedicine).  Patients are able to view lab/test results, encounter notes, upcoming appointments, etc.  Non-urgent messages can be sent to your provider as well.   To learn more about what you can do with MyChart, go to [email or web address].

## 2023-08-05 NOTE — Telephone Encounter (Signed)
 Called patient to see if she is interested in the Cardiac Rehab Program. Patient expressed interest, she will not find out what days her dialysis appts will be until the end of this week or next week. Will contact patient for scheduling once f/u has been completed later today.

## 2023-08-05 NOTE — Progress Notes (Unsigned)
 HEART AND VASCULAR CENTER   MULTIDISCIPLINARY HEART VALVE CLINIC                                     Cardiology Office Note:    Date:  08/06/2023   ID:  Rhonda Clayton, DOB January 16, 1946, MRN [id number]  PCP:  Avva, Ravisankar, MD  [facility] Cardiologist:  Arun K Thukkani, MD  [facility] Structural heart: [name], MD [facility] Electrophysiologist:  Will Gladis Norton, MD   Referring MD: Avva, Ravisankar, MD   [facility] s/p TAVR  History of Present Illness:    Rhonda Clayton is a 78 y.o. 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   Monitor in 03/2023 showed sinus pauses in the setting of trifascicular block.  She was referred to EP and seen by Dr. Nancey. Given commodities, he felt a leadless Medtronic Micra AV would be best if deemed a TAVR candidate.   Outpatient NM PET stress test 06/08/2023 demonstrated peri-infarct ischemia in the entire LAD distribution. She was then admitted from 06/12/2023 to 06/15/2023 for acute CHF. Echo showed LVEF of 35-40%, hypokinesis of the mid-apical, anterior, anterolateral and apical wall, G2DD, mod MAC with mild MR and severe LFLG AS with mean gradient 26 mm hg, AVA 0.89 cm2, DVI 0.28 and SVI 38. 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 She was then readmitted shortly after from 06/17/2023 to 06/20/2023 for recurrent CHF. She was started on HD and tunneled catheter was placed and volume managed with HD.    LHC 07/14/23 showed diffuse coronary artery disease of the mid to distal LAD, diagonal, and left circumflex systems. The lesions were relatively distal and not felt to explain the patient's cardiomyopathy. Given the lack of chest pain, plan was for medical management.    She presented to [facility] ED on 07/22/2023 for sinus pauses and  presyncope and was found to have up to 8 second pauses on telemetry. Temp pacer was placed on 07/23/2023 and she was admitted to the ICU. Seen by Dr. Nancey and given need for several hours of general sedation, it was felt best to proceed with TAVR prior to leadless PPM insertion. Patient underwent TAVR with a 26 mm Edwards Sapien 3 Ultra Resilia THV via the TF approach on 07/27/23. She was then taken for Dual Chamber Leadless PPM on the next available day which was 07/29/2023.  Today the patient presents to clinic for follow up. Here with a friend. She is doing great since PPM and TAVR with a marked symptomatic improvement and exercise tolerance. No CP. She still has exertional SOB but it is much improved. No LE edema, orthopnea or PND. No dizziness or syncope. No blood in stool or urine. No palpitations.    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 Verlin and Dr. Lucas.   Stress incontinence    Torn rotator cuff    Bilateral   Type 2 diabetes mellitus (HCC)    Venous insufficiency    Vitreous hemorrhage, left (HCC) 05/25/2019   The nature of the vitreous hemorrhage was discussed with the patient as well as the common causes.   Patients with diabetic eye disease may develop retinal neovascularization.  Other eye conditions develop retinal  neovascularization secondary to retinal venous occlusions.   Vitreous hemorrhage may result from spontaneous vitreous detachment or retinal breaks.  Blunt trauma is a common  cause as wl     Current Medications: Current Meds  Medication Sig   amoxicillin  (AMOXIL ) 500 MG tablet Take 4 tablets (2,000 mg total) by mouth as directed. 1 HOUR PRIOR TO DENTAL PROCEDURES, INCLUDING CLEANINGS   aspirin  EC 81 MG tablet Take 1 tablet (81 mg total) by mouth daily. Swallow whole.   atorvastatin  (LIPITOR) 40 MG tablet Take 1 tablet (40 mg total) by mouth daily.   B-D UF III MINI PEN NEEDLES 31G X 5 MM MISC Inject into the skin 2 (two) times daily.   betamethasone valerate (VALISONE) 0.1 % cream Apply 1 Application topically as needed (skin condition).   Coenzyme Q10 (COQ-10 PO) Take 1 tablet by mouth at bedtime.   Continuous Glucose Sensor (FREESTYLE LIBRE 3 SENSOR) MISC change every 14 days topically to monitor blood glucose continuously 28 days   D-MANNOSE PO Take 1 tablet by mouth at bedtime.   ezetimibe  (ZETIA ) 10 MG tablet Take 10 mg by mouth daily.   Folic Acid -Vit B6-Vit B12 (FOLBEE) 2.5-25-1 MG TABS tablet Take 1 tablet by mouth at bedtime.   furosemide  (LASIX ) 40 MG tablet Take 1 tablet (40 mg total) by mouth See admin instructions. Take on non-dialysis days.   Lancets (ONETOUCH DELICA PLUS LANCET33G) MISC Apply topically 3 (three) times daily.   levothyroxine  (SYNTHROID ) 100 MCG tablet Take 100 mcg by mouth See admin instructions. Takes 100 mcg daily except for Monday and Friday she takes 50 mcg   midodrine  (PROAMATINE ) 5 MG tablet Take 1 tablet (5 mg total) by mouth every Monday, Wednesday, and Friday with hemodialysis.   nitroGLYCERIN  (NITROSTAT ) 0.4 MG SL tablet Place 1 tablet (0.4 mg total) under the tongue every 5 (five) minutes as needed for chest pain.   NOVOLOG  MIX 70/30 FLEXPEN (70-30) 100 UNIT/ML FlexPen Inject into the skin in the morning and at bedtime. Takes 14-15 units in the morning and 9-10 units at before dinner According to how many carbohydrates she eats   ONETOUCH VERIO test strip 3 (three) times daily.   pantoprazole  (PROTONIX ) 40 MG tablet Take  1.5 tablets by mouth daily as needed (Heartburn).      ROS:   Please see the history of present illness.    All other systems reviewed and are negative.  EKGs       Risk Assessment/Calculations:           Physical Exam:    VS:  BP (!) 138/56   Pulse 67   Ht 5' 1 (1.549 m)   Wt 176 lb 12.8 oz (80.2 kg)   SpO2 98%   BMI 33.41 kg/m     Wt Readings from Last 3 Encounters:  08/05/23 176 lb 12.8 oz (80.2 kg)  07/31/23 172 lb 13.5 oz (78.4 kg)  07/14/23 173 lb (78.5 kg)     GEN: Well nourished, well developed in no acute distress, obese NECK: No JVD CARDIAC: RRR, soft flow murmur. No rubs, gallops RESPIRATORY:  Clear to auscultation without rales, wheezing or rhonchi  ABDOMEN: Soft, non-tender, non-distended EXTREMITIES:  No edema; No deformity.  Groin sites clear without hematoma or ecchymosis.   ASSESSMENT:    1. S/P TAVR (transcatheter aortic valve replacement)   2. Pacemaker   3. Heart failure with improved ejection fraction (HFimpEF) (HCC)   4. Coronary artery disease involving native coronary artery of native heart without angina pectoris   5. End stage renal disease (HCC)   6. Anemia, unspecified type     PLAN:    In order of problems listed above:  Severe LFLG AS s/p TAVR:  -- Pt doing great s/p TAVR.  -- Groin sites healing well.  -- SBE prophylaxis discussed; I have RX'd amoxicillin . .  -- Continue Aspirin  81mg  daily. -- Cleared to resume all activities without restriction from my standpoint (told her to follow restrictions for pacer implantation). -- Encouraged participation in CRP phase II. -- I will see back for 1 month echo and OV.  S/p PPM: -- Abbott dual chamber leadless PPM on 07/29/2023.  -- Followed by Dr. Nancey.   HFimpEF: -- Echo in 05/2023 showed LVEF of 35-40% with peri-apical severe hypokinesis with severe low flow low gradient AS as above. Improved to 50-55% on Echo on POD #1 on 07/28/2023.  -- Volume status being managed via  hemodialysis.  -- GDMT limited by ESRD and soft BP.  -- She was started on Midodrine  5mg  on hemodialysis days.   CAD -- LHC in 06/2023 showed 80% stenosis of mid and 99% stenosis of distal LAD, 100% stenosis of D1, 80% stenosis of mid and 70% stenosis of distal LCX. No intervention was performed given diffuse mid to distal LAD and LCX disease and no ACS.  -- Medical therapy was recommended.  -- Continue Aspirin  81mg  daily, Zetia  10mg  daily and Lipitor 40mg  daily. 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 She does not want to take a medication that involves injections either.)   ESRD -- Recently started on hemodialysis in 05/2023.  -- Currently going M,T,Th,Fri, but going to consolidate to 3x a week in near future.  -- Wants to trial peritoneal dialysis.   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 Baseline hemoglobin around 9 to 11.  -- Hemoglobin stable at 8.5 on day of discharge.     Cardiac Rehabilitation Eligibility Assessment  The patient is ready to start cardiac rehabilitation from a cardiac standpoint.    Medication Adjustments/Labs and Tests Ordered: Current medicines are reviewed at length with the patient today.  Concerns regarding medicines are outlined above.  No orders of the defined types were placed in this encounter.  Meds ordered this encounter  Medications   amoxicillin  (AMOXIL ) 500 MG tablet    Sig: Take 4 tablets (2,000 mg total) by mouth as directed. 1 HOUR PRIOR TO DENTAL PROCEDURES, INCLUDING CLEANINGS    Dispense:  12 tablet    Refill:  12    Patient Instructions  Medication Instructions:  Your physician recommends that you continue on your current medications as directed. Please refer to the Current Medication list given to you today.  Start Amoxicillin  500  mg, take 4 tablets by mouth 1 hour prior to dental procedures and cleanings.   *If you  need a refill on your cardiac medications before your next appointment, please call your pharmacy*  Lab Work: None needed If you have labs (blood work) drawn today and your tests are completely normal, you will receive your results only by: MyChart Message (if you have MyChart) OR A paper copy in the mail If you have any lab test that is abnormal or we need to change your treatment, we will call you to review the results.  Testing/Procedures: 08/2023 Your physician has requested that you have an echocardiogram. Echocardiography is a painless test that uses sound waves to create images of your heart. It provides your doctor with information about the size and shape of your heart and how well your heart's chambers and valves are working. This procedure takes approximately one hour. There are no restrictions for this procedure. Please do NOT wear cologne, perfume, aftershave, or lotions (deodorant is allowed). Please arrive 15 minutes prior to your appointment time.  Please note: We ask at that you not bring children with you during ultrasound (echo/ vascular) testing. Due to room size and safety concerns, children are not allowed in the ultrasound rooms during exams. Our front office staff cannot provide observation of children in our lobby area while testing is being conducted. An adult accompanying a patient to their appointment will only be allowed in the ultrasound room at the discretion of the ultrasound technician under special circumstances. We apologize for any inconvenience.   Follow-Up: At [facility], you and your health needs are our priority.  As part of our continuing mission to provide you with exceptional heart care, our providers are all part of one team.  This team includes your primary Cardiologist (physician) and Advanced Practice Providers or APPs (Physician Assistants and Nurse Practitioners) who all work together to provide you with the care you need, when you need  it.  Your next appointment:   As scheduled  Provider:   Izetta Hummer, PA-C    We recommend signing up for the patient portal called MyChart.  Sign up information is provided on this After Visit Summary.  MyChart is used to connect with patients for Virtual Visits (Telemedicine).  Patients are able to view lab/test results, encounter notes, upcoming appointments, etc.  Non-urgent messages can be sent to your provider as well.   To learn more about what you can do with MyChart, go to [email or web address].        Signed, Lamarr Hummer, PA-C  08/06/2023 10:16 AM    [facility]

## 2023-08-06 ENCOUNTER — Ambulatory Visit: Attending: Vascular Surgery | Admitting: Vascular Surgery

## 2023-08-06 ENCOUNTER — Telehealth (HOSPITAL_COMMUNITY): Payer: Self-pay

## 2023-08-06 ENCOUNTER — Encounter: Payer: Self-pay | Admitting: Vascular Surgery

## 2023-08-06 VITALS — BP 104/50 | HR 65 | Temp 97.9°F | Resp 18 | Ht 61.0 in | Wt 174.5 lb

## 2023-08-06 DIAGNOSIS — N186 End stage renal disease: Secondary | ICD-10-CM | POA: Diagnosis not present

## 2023-08-06 NOTE — Telephone Encounter (Signed)
 Pt insurance is active and benefits verified through [employer]. Co-pay $15, DED $0/$0 met, out of pocket $3,400/$2,750.65 met, co-insurance 0%. No pre-authorization required. 08/06/2023 @ 9:05am, spoke with Lona, REF# [id number].  How many CR sessions are covered? (36 visits for TCR, 72 visits for ICR)72 ICR Is this a lifetime maximum or an annual maximum? Annual Has the member used any of these services to date? No Is there a time limit (weeks/months) on start of program and/or program completion? No  2ndary insurance is active and benefits verified through [employer]. Co-pay $0, DED $1,500/$1,500 met, out of pocket $5,900/$5,900 met, co-insurance 70%. No pre-authorization required. 08/06/2023 @ 10:05am, spoke with Odis, REF# [id number].

## 2023-08-09 DIAGNOSIS — D631 Anemia in chronic kidney disease: Secondary | ICD-10-CM | POA: Diagnosis not present

## 2023-08-09 DIAGNOSIS — E8770 Fluid overload, unspecified: Secondary | ICD-10-CM | POA: Diagnosis not present

## 2023-08-09 DIAGNOSIS — N186 End stage renal disease: Secondary | ICD-10-CM | POA: Diagnosis not present

## 2023-08-09 DIAGNOSIS — Z992 Dependence on renal dialysis: Secondary | ICD-10-CM | POA: Diagnosis not present

## 2023-08-09 DIAGNOSIS — N2581 Secondary hyperparathyroidism of renal origin: Secondary | ICD-10-CM | POA: Diagnosis not present

## 2023-08-12 ENCOUNTER — Ambulatory Visit: Attending: Cardiology | Admitting: *Deleted

## 2023-08-12 DIAGNOSIS — Z950 Presence of cardiac pacemaker: Secondary | ICD-10-CM

## 2023-08-16 DIAGNOSIS — N2581 Secondary hyperparathyroidism of renal origin: Secondary | ICD-10-CM | POA: Diagnosis not present

## 2023-08-16 DIAGNOSIS — T8249XA Other complication of vascular dialysis catheter, initial encounter: Secondary | ICD-10-CM | POA: Diagnosis not present

## 2023-08-16 DIAGNOSIS — Z992 Dependence on renal dialysis: Secondary | ICD-10-CM | POA: Diagnosis not present

## 2023-08-16 DIAGNOSIS — N186 End stage renal disease: Secondary | ICD-10-CM | POA: Diagnosis not present

## 2023-08-16 DIAGNOSIS — D688 Other specified coagulation defects: Secondary | ICD-10-CM | POA: Diagnosis not present

## 2023-08-16 DIAGNOSIS — D631 Anemia in chronic kidney disease: Secondary | ICD-10-CM | POA: Diagnosis not present

## 2023-08-16 DIAGNOSIS — D500 Iron deficiency anemia secondary to blood loss (chronic): Secondary | ICD-10-CM | POA: Diagnosis not present

## 2023-08-17 NOTE — Progress Notes (Signed)
 Leadless pacemaker check in clinic performed by [employer] reps. Please see scanned/attached report.  Presenting rhythm: . Longevity RA 2.4 years. RV 12.2 years. VS 99%, AM-VP 93%,  VP <1%.   Implanted for AV Block.

## 2023-08-18 ENCOUNTER — Encounter: Admitting: Surgery

## 2023-08-18 DIAGNOSIS — I5042 Chronic combined systolic (congestive) and diastolic (congestive) heart failure: Secondary | ICD-10-CM | POA: Diagnosis not present

## 2023-08-18 DIAGNOSIS — I25118 Atherosclerotic heart disease of native coronary artery with other forms of angina pectoris: Secondary | ICD-10-CM | POA: Diagnosis not present

## 2023-08-18 DIAGNOSIS — I350 Nonrheumatic aortic (valve) stenosis: Secondary | ICD-10-CM | POA: Diagnosis not present

## 2023-08-18 DIAGNOSIS — E113553 Type 2 diabetes mellitus with stable proliferative diabetic retinopathy, bilateral: Secondary | ICD-10-CM | POA: Diagnosis not present

## 2023-08-18 DIAGNOSIS — Z952 Presence of prosthetic heart valve: Secondary | ICD-10-CM | POA: Diagnosis not present

## 2023-08-18 DIAGNOSIS — Z992 Dependence on renal dialysis: Secondary | ICD-10-CM | POA: Diagnosis not present

## 2023-08-18 DIAGNOSIS — N186 End stage renal disease: Secondary | ICD-10-CM | POA: Diagnosis not present

## 2023-08-18 DIAGNOSIS — Z950 Presence of cardiac pacemaker: Secondary | ICD-10-CM | POA: Diagnosis not present

## 2023-08-18 DIAGNOSIS — E1129 Type 2 diabetes mellitus with other diabetic kidney complication: Secondary | ICD-10-CM | POA: Diagnosis not present

## 2023-08-19 DIAGNOSIS — N186 End stage renal disease: Secondary | ICD-10-CM | POA: Diagnosis not present

## 2023-08-19 DIAGNOSIS — E1122 Type 2 diabetes mellitus with diabetic chronic kidney disease: Secondary | ICD-10-CM | POA: Diagnosis not present

## 2023-08-19 DIAGNOSIS — Z992 Dependence on renal dialysis: Secondary | ICD-10-CM | POA: Diagnosis not present

## 2023-08-20 DIAGNOSIS — Z992 Dependence on renal dialysis: Secondary | ICD-10-CM | POA: Diagnosis not present

## 2023-08-20 DIAGNOSIS — D688 Other specified coagulation defects: Secondary | ICD-10-CM | POA: Diagnosis not present

## 2023-08-20 DIAGNOSIS — N2581 Secondary hyperparathyroidism of renal origin: Secondary | ICD-10-CM | POA: Diagnosis not present

## 2023-08-20 DIAGNOSIS — T8249XA Other complication of vascular dialysis catheter, initial encounter: Secondary | ICD-10-CM | POA: Diagnosis not present

## 2023-08-20 DIAGNOSIS — N186 End stage renal disease: Secondary | ICD-10-CM | POA: Diagnosis not present

## 2023-08-20 DIAGNOSIS — E8770 Fluid overload, unspecified: Secondary | ICD-10-CM | POA: Diagnosis not present

## 2023-08-23 DIAGNOSIS — N2581 Secondary hyperparathyroidism of renal origin: Secondary | ICD-10-CM | POA: Diagnosis not present

## 2023-08-23 DIAGNOSIS — N186 End stage renal disease: Secondary | ICD-10-CM | POA: Diagnosis not present

## 2023-08-23 DIAGNOSIS — E8770 Fluid overload, unspecified: Secondary | ICD-10-CM | POA: Diagnosis not present

## 2023-08-23 DIAGNOSIS — D688 Other specified coagulation defects: Secondary | ICD-10-CM | POA: Diagnosis not present

## 2023-08-23 DIAGNOSIS — Z992 Dependence on renal dialysis: Secondary | ICD-10-CM | POA: Diagnosis not present

## 2023-08-23 DIAGNOSIS — T8249XA Other complication of vascular dialysis catheter, initial encounter: Secondary | ICD-10-CM | POA: Diagnosis not present

## 2023-08-23 DIAGNOSIS — D500 Iron deficiency anemia secondary to blood loss (chronic): Secondary | ICD-10-CM | POA: Diagnosis not present

## 2023-08-30 DIAGNOSIS — T8249XA Other complication of vascular dialysis catheter, initial encounter: Secondary | ICD-10-CM | POA: Diagnosis not present

## 2023-08-30 DIAGNOSIS — N2581 Secondary hyperparathyroidism of renal origin: Secondary | ICD-10-CM | POA: Diagnosis not present

## 2023-08-30 DIAGNOSIS — D688 Other specified coagulation defects: Secondary | ICD-10-CM | POA: Diagnosis not present

## 2023-08-30 DIAGNOSIS — Z992 Dependence on renal dialysis: Secondary | ICD-10-CM | POA: Diagnosis not present

## 2023-08-30 DIAGNOSIS — E8770 Fluid overload, unspecified: Secondary | ICD-10-CM | POA: Diagnosis not present

## 2023-08-30 DIAGNOSIS — N186 End stage renal disease: Secondary | ICD-10-CM | POA: Diagnosis not present

## 2023-09-01 DIAGNOSIS — I120 Hypertensive chronic kidney disease with stage 5 chronic kidney disease or end stage renal disease: Secondary | ICD-10-CM | POA: Diagnosis not present

## 2023-09-01 DIAGNOSIS — I10 Essential (primary) hypertension: Secondary | ICD-10-CM | POA: Diagnosis not present

## 2023-09-01 DIAGNOSIS — N186 End stage renal disease: Secondary | ICD-10-CM | POA: Diagnosis not present

## 2023-09-01 DIAGNOSIS — D688 Other specified coagulation defects: Secondary | ICD-10-CM | POA: Diagnosis not present

## 2023-09-01 DIAGNOSIS — Z992 Dependence on renal dialysis: Secondary | ICD-10-CM | POA: Diagnosis not present

## 2023-09-01 DIAGNOSIS — T8249XA Other complication of vascular dialysis catheter, initial encounter: Secondary | ICD-10-CM | POA: Diagnosis not present

## 2023-09-01 DIAGNOSIS — Z952 Presence of prosthetic heart valve: Secondary | ICD-10-CM | POA: Diagnosis not present

## 2023-09-01 DIAGNOSIS — N2581 Secondary hyperparathyroidism of renal origin: Secondary | ICD-10-CM | POA: Diagnosis not present

## 2023-09-01 DIAGNOSIS — K219 Gastro-esophageal reflux disease without esophagitis: Secondary | ICD-10-CM | POA: Diagnosis not present

## 2023-09-01 DIAGNOSIS — E119 Type 2 diabetes mellitus without complications: Secondary | ICD-10-CM | POA: Diagnosis not present

## 2023-09-01 DIAGNOSIS — D500 Iron deficiency anemia secondary to blood loss (chronic): Secondary | ICD-10-CM | POA: Diagnosis not present

## 2023-09-03 NOTE — Addendum Note (Signed)
 Addendum  created 09/03/23 1033 by Erma Thom SAUNDERS, MD   [facility]procedure Staff edited

## 2023-09-06 ENCOUNTER — Telehealth: Payer: Self-pay | Admitting: Cardiovascular Disease

## 2023-09-06 DIAGNOSIS — D631 Anemia in chronic kidney disease: Secondary | ICD-10-CM | POA: Diagnosis not present

## 2023-09-06 DIAGNOSIS — D688 Other specified coagulation defects: Secondary | ICD-10-CM | POA: Diagnosis not present

## 2023-09-06 DIAGNOSIS — Z992 Dependence on renal dialysis: Secondary | ICD-10-CM | POA: Diagnosis not present

## 2023-09-06 DIAGNOSIS — N2581 Secondary hyperparathyroidism of renal origin: Secondary | ICD-10-CM | POA: Diagnosis not present

## 2023-09-06 DIAGNOSIS — D500 Iron deficiency anemia secondary to blood loss (chronic): Secondary | ICD-10-CM | POA: Diagnosis not present

## 2023-09-06 DIAGNOSIS — N186 End stage renal disease: Secondary | ICD-10-CM | POA: Diagnosis not present

## 2023-09-06 DIAGNOSIS — T8249XA Other complication of vascular dialysis catheter, initial encounter: Secondary | ICD-10-CM | POA: Diagnosis not present

## 2023-09-06 NOTE — Telephone Encounter (Signed)
 Spoke to pt who is currently in dialysis. Currently asymptomatic. Patient added onto device clinic 09/07/23 @ 11:30 d/t device is not remote monitor capable. Patient is aware to call if any symptoms arise.

## 2023-09-06 NOTE — Telephone Encounter (Signed)
 Spoke with pt, she reports her oximeter this morning showed her pulse was 40 bpm. She is currently in dialysis and the nurse reports her pulse is irregular. The patient reports she feels fine and she will come into the office tomorrow for pacer check.

## 2023-09-06 NOTE — Telephone Encounter (Signed)
 STAT if HR is under 50 or over 120  (normal HR is 60-100 beats per minute)  What is your heart rate? 54 - currently   Do you have a log of your heart rate readings (document readings)? 40's-70's (fluctuating up and down currently while in dialysis)   Do you have any other symptoms? No    She asked if someone can check her device to make sure there is nothing abnormal going on.

## 2023-09-07 ENCOUNTER — Ambulatory Visit: Attending: Cardiology | Admitting: *Deleted

## 2023-09-07 DIAGNOSIS — Z950 Presence of cardiac pacemaker: Secondary | ICD-10-CM

## 2023-09-07 NOTE — Progress Notes (Deleted)
 HEART AND VASCULAR CENTER   MULTIDISCIPLINARY HEART VALVE CLINIC                                     Cardiology Office Note:    Date:  09/07/2023   ID:  Rhonda Clayton, DOB May 27, 1945, MRN [id number]  PCP:  Avva, Ravisankar, MD  [facility] Cardiologist:  Arun K Thukkani, MD  [facility] Structural heart: [name], MD [facility] Electrophysiologist:  Will Gladis Norton, MD   Referring MD: Avva, Ravisankar, MD   1 month s/p TAVR  History of Present Illness:    Rhonda Clayton is a 78 y.o. 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   Monitor in 03/2023 showed sinus pauses in the setting of trifascicular block.  She was referred to EP and seen by Dr. Nancey. Given commodities, he felt a leadless Medtronic Micra AV would be best if deemed a TAVR candidate.   Outpatient NM PET stress test 06/08/2023 demonstrated peri-infarct ischemia in the entire LAD distribution. She was then admitted from 06/12/2023 to 06/15/2023 for acute CHF. Echo showed LVEF of 35-40%, hypokinesis of the mid-apical, anterior, anterolateral and apical wall, G2DD, mod MAC with mild MR and severe LFLG AS with mean gradient 26 mm hg, AVA 0.89 cm2, DVI 0.28 and SVI 38. 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 She was then readmitted shortly after from 06/17/2023 to 06/20/2023 for recurrent CHF. She was started on HD and tunneled catheter was placed and volume managed with HD.    LHC 07/14/23 showed diffuse coronary artery disease of the mid to distal LAD, diagonal, and left circumflex systems. The lesions were relatively distal and not felt to explain the patient's cardiomyopathy. Given the lack of chest pain, plan was for medical management.    She presented to [facility] ED on 07/22/2023 for sinus pauses and  presyncope and was found to have up to 8 second pauses on telemetry. Temp pacer was placed on 07/23/2023 and she was admitted to the ICU. Seen by Dr. Nancey and given need for several hours of general sedation, it was felt best to proceed with TAVR prior to leadless PPM insertion. Patient underwent TAVR with a 26 mm Edwards Sapien 3 Ultra Resilia THV via the TF approach on 07/27/23. She was then taken for Dual Chamber Leadless PPM on 07/29/2023. She has had a marked symptomatic improvement and increase in exercise tolerance since PPM and TAVR.   Today the patient presents to clinic for follow up. Here with a friend.    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  McAlhany and Dr. Lucas.   Stress incontinence    Torn rotator cuff    Bilateral   Type 2 diabetes mellitus (HCC)    Venous insufficiency    Vitreous hemorrhage, left (HCC) 05/25/2019   The nature of the vitreous hemorrhage was discussed with the patient as well as the common causes.   Patients with diabetic eye disease may develop retinal neovascularization.  Other eye conditions develop retinal  neovascularization secondary to retinal venous occlusions.   Vitreous hemorrhage may result from spontaneous vitreous detachment or retinal breaks.  Blunt trauma is a common cause as wl     Current Medications: No outpatient medications have been marked as taking for the 09/08/23 encounter (Appointment) with [name], PA-C.      ROS:   Please see the  history of present illness.    All other systems reviewed and are negative.  EKGs       Risk Assessment/Calculations:       {This patient may be at risk for Amyloid. She has one or more dx on the prob list or PMH from the following list -  Abnormal EKG, HFpEF/Diastolic CHF, Aortic Stenosis, LVH, Bilateral Carpal Tunnel Syndrome, Biceps Tendon Rupture, Spinal Stenosis, Pericardial Effusion, Left Atrial Enlargement, Conduction System Disorder. See list below or review PMH.  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    Wt Readings from Last 3 Encounters:  08/06/23 174 lb 8 oz (79.2 kg)  08/05/23 176 lb 12.8 oz (80.2 kg)  07/31/23 172 lb 13.5 oz (78.4 kg)     GEN: Well nourished, well developed in no acute distress, obese NECK: No JVD CARDIAC: RRR, soft flow murmur. No rubs, gallops RESPIRATORY:  Clear to auscultation without rales, wheezing or rhonchi  ABDOMEN: Soft, non-tender, non-distended EXTREMITIES:  No edema; No deformity.  G  ASSESSMENT:    1. S/P TAVR (transcatheter aortic valve replacement)   2. Pacemaker   3. Heart failure with improved ejection fraction (HFimpEF) (HCC)   4. Coronary artery disease involving native coronary artery of native heart without angina pectoris   5. End stage renal disease (HCC)   6. Anemia, unspecified type      PLAN:    In order of problems listed above:  Severe LFLG AS s/p TAVR:  -- Echo today shows EF ***, normally functioning TAVR with a mean gradient of *** mm hg and *** PVL.  -- NYHA class *** -- SBE prophylaxis discussed; I have RX'd amoxicillin .  --  Continue Aspirin  81mg  daily. -- Encouraged participation in CRP phase II. -- I will see back for 1 year echo and OV.  S/p PPM: -- Abbott dual chamber leadless PPM on 07/29/2023.  -- Followed by Dr. Nancey.   HFimpEF: -- Echo in 05/2023 showed LVEF of 35-40% with peri-apical severe hypokinesis with severe low flow low gradient AS as above. Improved to 50-55% on Echo on POD #1 on 07/28/2023.  -- EF *** today. -- Volume status being managed via hemodialysis.  --  GDMT limited by ESRD and soft BP.  -- She was started on Midodrine  5mg  on hemodialysis days.   CAD -- LHC in 06/2023 showed 80% stenosis of mid and 99% stenosis of distal LAD, 100% stenosis of D1, 80% stenosis of mid and 70% stenosis of distal LCX. No intervention was performed given diffuse mid to distal LAD and LCX disease and no ACS.  -- Medical therapy was recommended.  -- Continue Aspirin  81mg  daily, Zetia  10mg  daily and Lipitor 40mg  daily. 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 She does not want to take a medication that involves injections either.)   ESRD -- Recently started on hemodialysis in 05/2023.  -- Currently going M,T,Th,Fri, but going to consolidate to 3x a week in near future.  -- Wants to trial peritoneal dialysis.   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 Baseline hemoglobin around 8 to 11.     {The patient has an active order for outpatient cardiac rehabilitation.   Please indicate if the patient is ready to start. Do NOT delete this.  It will auto delete.  Refresh note, then sign.              Click here to document readiness and see contraindications.  :1}  Cardiac Rehabilitation Eligibility Assessment      Medication Adjustments/Labs and Tests Ordered: Current medicines are reviewed at length with the patient today.  Concerns regarding medicines are outlined above.  No orders of the  defined types were placed in this encounter.  No orders of the defined types were placed in this encounter.   There are no Patient Instructions on file for this visit.   Signed, Lamarr Hummer, PA-C  09/07/2023 2:37 PM    [facility]

## 2023-09-07 NOTE — Progress Notes (Signed)
 Dual chamber leadless pacemaker checked in clinic today by [facility]. [employer] rep. See attachment in [employer] for details

## 2023-09-08 ENCOUNTER — Ambulatory Visit (HOSPITAL_COMMUNITY): Admit: 2023-09-08

## 2023-09-08 ENCOUNTER — Ambulatory Visit: Attending: Cardiology | Admitting: Physician Assistant

## 2023-09-08 DIAGNOSIS — N186 End stage renal disease: Secondary | ICD-10-CM

## 2023-09-08 DIAGNOSIS — Z950 Presence of cardiac pacemaker: Secondary | ICD-10-CM

## 2023-09-08 DIAGNOSIS — Z952 Presence of prosthetic heart valve: Secondary | ICD-10-CM

## 2023-09-08 DIAGNOSIS — I2510 Atherosclerotic heart disease of native coronary artery without angina pectoris: Secondary | ICD-10-CM

## 2023-09-08 DIAGNOSIS — D649 Anemia, unspecified: Secondary | ICD-10-CM

## 2023-09-08 DIAGNOSIS — I5032 Chronic diastolic (congestive) heart failure: Secondary | ICD-10-CM

## 2023-09-09 ENCOUNTER — Telehealth (HOSPITAL_COMMUNITY): Payer: Self-pay | Admitting: *Deleted

## 2023-09-09 ENCOUNTER — Telehealth: Payer: Self-pay

## 2023-09-09 NOTE — Telephone Encounter (Signed)
 This nurse outreached to patient to confirm interest in L AVF surgery with Dr. Pearline (referral rec'd from Dr. Marlee - nephro).  Patient states she has follow up with cardiology in October first prior to making this decision.  This nurse advised patient to follow up with cardiology and do what she needs to do to prepare but please let Dr. Marlee know about her decision. Patient stated understanding.

## 2023-09-09 NOTE — Telephone Encounter (Signed)
 Received fax from Evalene Lanes requesting AVF placement as backup to PD catheter. Appt with [facility] 08/06/2023 discussing L UE AVF.   TSB ok'd for surgery scheduling. Will notify Alan and scan fax into media.

## 2023-09-13 DIAGNOSIS — D500 Iron deficiency anemia secondary to blood loss (chronic): Secondary | ICD-10-CM | POA: Diagnosis not present

## 2023-09-13 DIAGNOSIS — D688 Other specified coagulation defects: Secondary | ICD-10-CM | POA: Diagnosis not present

## 2023-09-13 DIAGNOSIS — N2581 Secondary hyperparathyroidism of renal origin: Secondary | ICD-10-CM | POA: Diagnosis not present

## 2023-09-13 DIAGNOSIS — E1122 Type 2 diabetes mellitus with diabetic chronic kidney disease: Secondary | ICD-10-CM | POA: Diagnosis not present

## 2023-09-13 DIAGNOSIS — N186 End stage renal disease: Secondary | ICD-10-CM | POA: Diagnosis not present

## 2023-09-13 DIAGNOSIS — T8249XA Other complication of vascular dialysis catheter, initial encounter: Secondary | ICD-10-CM | POA: Diagnosis not present

## 2023-09-13 DIAGNOSIS — E8770 Fluid overload, unspecified: Secondary | ICD-10-CM | POA: Diagnosis not present

## 2023-09-13 DIAGNOSIS — Z992 Dependence on renal dialysis: Secondary | ICD-10-CM | POA: Diagnosis not present

## 2023-09-14 ENCOUNTER — Encounter

## 2023-09-19 DIAGNOSIS — N186 End stage renal disease: Secondary | ICD-10-CM | POA: Diagnosis not present

## 2023-09-19 DIAGNOSIS — Z992 Dependence on renal dialysis: Secondary | ICD-10-CM | POA: Diagnosis not present

## 2023-09-19 DIAGNOSIS — E1122 Type 2 diabetes mellitus with diabetic chronic kidney disease: Secondary | ICD-10-CM | POA: Diagnosis not present

## 2023-09-20 DIAGNOSIS — Z794 Long term (current) use of insulin: Secondary | ICD-10-CM | POA: Diagnosis not present

## 2023-09-20 DIAGNOSIS — E1122 Type 2 diabetes mellitus with diabetic chronic kidney disease: Secondary | ICD-10-CM | POA: Diagnosis not present

## 2023-09-20 DIAGNOSIS — T8249XA Other complication of vascular dialysis catheter, initial encounter: Secondary | ICD-10-CM | POA: Diagnosis not present

## 2023-09-20 DIAGNOSIS — I350 Nonrheumatic aortic (valve) stenosis: Secondary | ICD-10-CM | POA: Diagnosis not present

## 2023-09-20 DIAGNOSIS — I132 Hypertensive heart and chronic kidney disease with heart failure and with stage 5 chronic kidney disease, or end stage renal disease: Secondary | ICD-10-CM | POA: Diagnosis not present

## 2023-09-20 DIAGNOSIS — N2581 Secondary hyperparathyroidism of renal origin: Secondary | ICD-10-CM | POA: Diagnosis not present

## 2023-09-20 DIAGNOSIS — D500 Iron deficiency anemia secondary to blood loss (chronic): Secondary | ICD-10-CM | POA: Diagnosis not present

## 2023-09-20 DIAGNOSIS — Z992 Dependence on renal dialysis: Secondary | ICD-10-CM | POA: Diagnosis not present

## 2023-09-20 DIAGNOSIS — Z48812 Encounter for surgical aftercare following surgery on the circulatory system: Secondary | ICD-10-CM | POA: Diagnosis not present

## 2023-09-20 DIAGNOSIS — D688 Other specified coagulation defects: Secondary | ICD-10-CM | POA: Diagnosis not present

## 2023-09-20 DIAGNOSIS — I495 Sick sinus syndrome: Secondary | ICD-10-CM | POA: Diagnosis not present

## 2023-09-20 DIAGNOSIS — I5043 Acute on chronic combined systolic (congestive) and diastolic (congestive) heart failure: Secondary | ICD-10-CM | POA: Diagnosis not present

## 2023-09-20 DIAGNOSIS — N186 End stage renal disease: Secondary | ICD-10-CM | POA: Diagnosis not present

## 2023-09-20 DIAGNOSIS — I455 Other specified heart block: Secondary | ICD-10-CM | POA: Diagnosis not present

## 2023-09-20 DIAGNOSIS — I453 Trifascicular block: Secondary | ICD-10-CM | POA: Diagnosis not present

## 2023-09-20 DIAGNOSIS — I440 Atrioventricular block, first degree: Secondary | ICD-10-CM | POA: Diagnosis not present

## 2023-09-25 ENCOUNTER — Encounter (HOSPITAL_COMMUNITY): Payer: Self-pay | Admitting: Emergency Medicine

## 2023-09-25 ENCOUNTER — Ambulatory Visit (HOSPITAL_COMMUNITY)
Admission: EM | Admit: 2023-09-25 | Discharge: 2023-09-25 | Disposition: A | Discharge status: Home / Self Care | Attending: Family Medicine | Admitting: Family Medicine

## 2023-09-25 ENCOUNTER — Other Ambulatory Visit: Payer: Self-pay

## 2023-09-25 DIAGNOSIS — N3090 Cystitis, unspecified without hematuria: Secondary | ICD-10-CM | POA: Diagnosis not present

## 2023-09-25 DIAGNOSIS — R300 Dysuria: Secondary | ICD-10-CM | POA: Diagnosis not present

## 2023-09-25 LAB — POCT URINALYSIS DIP (MANUAL ENTRY)
Bilirubin, UA: NEGATIVE
Glucose, UA: NEGATIVE mg/dL
Ketones, POC UA: NEGATIVE mg/dL
Nitrite, UA: NEGATIVE
Protein Ur, POC: 100 mg/dL — AB
Spec Grav, UA: 1.015 (ref 1.010–1.025)
Urobilinogen, UA: 0.2 U/dL
pH, UA: 8.5 — AB (ref 5.0–8.0)

## 2023-09-25 MED ORDER — FOSFOMYCIN TROMETHAMINE 3 G PO PACK: 3.0000 g | PACK | Freq: Once | ORAL | 0 refills | Status: AC

## 2023-09-25 NOTE — ED Triage Notes (Signed)
 Patient is concerned for a uti.  Patient brought urine with her.  Patient reports symptoms inconsistent for a week.  Last night urination was very painful.  Has not had any medications for symptoms  Complex cardiac and renal history.  The urine she brought was obtained at 7am

## 2023-09-25 NOTE — ED Provider Notes (Signed)
 [facility]    ASSESSMENT & PLAN:  1. Dysuria   2. Cystitis    Begin: Meds ordered this encounter  Medications   fosfomycin (MONUROL ) 3 g PACK    Sig: Take 3 g by mouth once for 1 dose.    Dispense:  3 g    Refill:  0   Did not sent urine culture as she brings urine from home and this has set out approx 4 hours PTA.   Follow-up Information     Schedule an appointment as soon as possible for a visit  with Avva, Ravisankar, MD.   Specialty: Internal Medicine Why: For a follow up. Contact information: [address] [place] [address] [phone]                 No signs of pyelonephritis. Ensure proper hydration. Will follow up with her PCP or here if not showing improvement over the next 48 hours, sooner if needed.  Outlined signs and symptoms indicating need for more acute intervention. Patient verbalized understanding. After Visit Summary given.  SUBJECTIVE:  Rhonda Clayton is a 78 y.o. 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  Feel like I have a UTI. Denies fever/chills/n/v.  LMP: No LMP recorded. Patient is postmenopausal.  OBJECTIVE:  Vitals:   09/25/23 1200  BP: (!) 152/73  Pulse: 70  Resp: 20  Temp: 98 F (36.7 C)  TempSrc: Oral  SpO2: 96%   General appearance: alert; no distress Lungs: unlabored respirations Abdomen: soft Back: no CVA tenderness Extremities: no edema; symmetrical with no gross deformities Skin: warm and dry Neurologic: normal gait Psychological: alert and cooperative; normal mood and affect  Labs Reviewed  POCT URINALYSIS DIP (MANUAL ENTRY) - Abnormal; Notable for the following components:      Result Value   Blood, UA small (*)    pH, UA 8.5 (*)    Protein Ur, POC =100 (*)    Leukocytes, UA Moderate (2+) (*)    All other components within normal limits    Allergies  Allergen Reactions   Coffee Flavoring Agent (Non-Screening) Other (See Comments)     Sick on stomach, sneezing, backed up   Azithromycin  Other (See Comments)   Fluogen [Influenza Virus Vaccine]    Molds & Smuts     Allergy test showed    Pneumococcal Vac Polyvalent Other (See Comments)    Large site reaction    Tobacco Nausea And Vomiting   Versed  [Midazolam ] Other (See Comments)    No memory altering medications. Patient request.   Cephalexin  Other (See Comments)    Yeast infection TOLERATED CEFAZOLIN  PRIOR   Ciprofloxacin Other (See Comments)    tendinitis   Oatmeal Other (See Comments)    Sneezing and drainage   Sulfa Antibiotics Other (See Comments)    Yeast infection    Past Medical History:  Diagnosis Date   Anemia    CKD (chronic kidney disease) stage 5, GFR less than 15 ml/min (HCC)    Cystocele    DJD (degenerative joint disease)    DM (diabetes mellitus) (HCC)    Esophageal stricture    Essential hypertension    Hyperlipidemia    Hypothyroidism    Lichen sclerosus    Osteopenia    RBBB with left anterior fascicular block    Rectocele    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 Verlin and Dr. Lucas.   Stress incontinence    Torn rotator cuff    Bilateral   Type 2 diabetes mellitus (HCC)    Venous insufficiency    Vitreous hemorrhage, left (HCC) 05/25/2019   The nature of the vitreous hemorrhage was discussed with the patient as well as the common causes.   Patients with diabetic eye disease may develop retinal neovascularization.  Other eye conditions develop retinal  neovascularization secondary to retinal venous occlusions.   Vitreous hemorrhage may result from spontaneous vitreous detachment or retinal breaks.  Blunt trauma is a common cause as wl   Social History   Socioeconomic History   Marital status: Widowed    Spouse name: Not on file   Number of children: 2   Years of education: Not on file   Highest education level: Not  on file  Occupational History   Occupation: Retired  Tobacco Use   Smoking status: Never   Smokeless tobacco: Never  Vaping Use   Vaping status: Never Used  Substance and Sexual Activity   Alcohol  use: No   Drug use: Yes    Types: Hydromorphone    Sexual activity: Not on file  Other Topics Concern   Not on file  Social History Narrative   Not on file   Social Drivers of Health   Financial Resource Strain: Not on file  Food Insecurity: No Food Insecurity (07/23/2023)   Hunger Vital Sign    Worried About Running Out of Food in the Last Year: Never true    Ran Out of Food in the Last Year: Never true  Transportation Needs: No Transportation Needs (07/23/2023)   PRAPARE - Administrator, Civil Service (Medical): No    Lack of Transportation (Non-Medical): No  Physical Activity: Not on file  Stress: Not on file  Social Connections: Patient Declined (07/23/2023)   Social Connection and Isolation Panel    Frequency of Communication with Friends and Family: Patient declined    Frequency of Social Gatherings with Friends and Family: Patient declined    Attends Religious Services: Patient declined    Database administrator or Organizations: Patient declined    Attends Banker Meetings: Patient declined    Marital Status: Patient declined  Intimate Partner Violence: Not At Risk (07/23/2023)   Humiliation, Afraid, Rape, and Kick questionnaire    Fear of Current or Ex-Partner: No    Emotionally Abused: No    Physically Abused: No    Sexually Abused: No   Family History  Problem Relation Age of Onset   Multiple myeloma Mother    Cancer Maternal Aunt        Cancer in the mouth - dipped snuff   Heart disease Brother    Diabetes Father    Colon polyps Brother    Heart disease Maternal Uncle    Heart disease Paternal Uncle    Heart disease Paternal Apolinar Rolinda Rogue, MD 09/25/23 [phone]

## 2023-09-27 DIAGNOSIS — D688 Other specified coagulation defects: Secondary | ICD-10-CM | POA: Diagnosis not present

## 2023-09-27 DIAGNOSIS — E8770 Fluid overload, unspecified: Secondary | ICD-10-CM | POA: Diagnosis not present

## 2023-09-27 DIAGNOSIS — N186 End stage renal disease: Secondary | ICD-10-CM | POA: Diagnosis not present

## 2023-09-27 DIAGNOSIS — N2581 Secondary hyperparathyroidism of renal origin: Secondary | ICD-10-CM | POA: Diagnosis not present

## 2023-09-27 DIAGNOSIS — Z992 Dependence on renal dialysis: Secondary | ICD-10-CM | POA: Diagnosis not present

## 2023-09-27 DIAGNOSIS — D500 Iron deficiency anemia secondary to blood loss (chronic): Secondary | ICD-10-CM | POA: Diagnosis not present

## 2023-09-27 DIAGNOSIS — E1122 Type 2 diabetes mellitus with diabetic chronic kidney disease: Secondary | ICD-10-CM | POA: Diagnosis not present

## 2023-09-27 DIAGNOSIS — T8249XA Other complication of vascular dialysis catheter, initial encounter: Secondary | ICD-10-CM | POA: Diagnosis not present

## 2023-10-04 ENCOUNTER — Telehealth: Payer: Self-pay | Admitting: Cardiovascular Disease

## 2023-10-04 ENCOUNTER — Telehealth: Payer: Self-pay

## 2023-10-04 DIAGNOSIS — D688 Other specified coagulation defects: Secondary | ICD-10-CM | POA: Diagnosis not present

## 2023-10-04 DIAGNOSIS — N186 End stage renal disease: Secondary | ICD-10-CM | POA: Diagnosis not present

## 2023-10-04 DIAGNOSIS — T8249XA Other complication of vascular dialysis catheter, initial encounter: Secondary | ICD-10-CM | POA: Diagnosis not present

## 2023-10-04 DIAGNOSIS — D500 Iron deficiency anemia secondary to blood loss (chronic): Secondary | ICD-10-CM | POA: Diagnosis not present

## 2023-10-04 DIAGNOSIS — Z992 Dependence on renal dialysis: Secondary | ICD-10-CM | POA: Diagnosis not present

## 2023-10-04 DIAGNOSIS — N2581 Secondary hyperparathyroidism of renal origin: Secondary | ICD-10-CM | POA: Diagnosis not present

## 2023-10-04 DIAGNOSIS — U071 COVID-19: Secondary | ICD-10-CM | POA: Diagnosis not present

## 2023-10-04 NOTE — Telephone Encounter (Signed)
 Pt contacted by phone for post TAVR follow up.  The pt underwent TAVR on 07/27/2023 and has had to cancel her recent follow up and echo due to having covid.  [facility] completed over the phone and NYHA class I.  The pt states that prior to covid she was feeling well. The pt's echo has been rescheduled into October which will be outside the window for TAVR registry guidelines but once complete we will review and make the pt aware of results.

## 2023-10-04 NOTE — Telephone Encounter (Signed)
 Pt needed to reschedule ECHO for 10/21 but wants to know if this is too far out of the time frame it needs to be done due to her pacer. Please advise.

## 2023-10-05 ENCOUNTER — Other Ambulatory Visit (HOSPITAL_BASED_OUTPATIENT_CLINIC_OR_DEPARTMENT_OTHER)

## 2023-10-11 DIAGNOSIS — N2581 Secondary hyperparathyroidism of renal origin: Secondary | ICD-10-CM | POA: Diagnosis not present

## 2023-10-11 DIAGNOSIS — T8249XA Other complication of vascular dialysis catheter, initial encounter: Secondary | ICD-10-CM | POA: Diagnosis not present

## 2023-10-11 DIAGNOSIS — Z992 Dependence on renal dialysis: Secondary | ICD-10-CM | POA: Diagnosis not present

## 2023-10-11 DIAGNOSIS — U071 COVID-19: Secondary | ICD-10-CM | POA: Diagnosis not present

## 2023-10-11 DIAGNOSIS — D500 Iron deficiency anemia secondary to blood loss (chronic): Secondary | ICD-10-CM | POA: Diagnosis not present

## 2023-10-11 DIAGNOSIS — N186 End stage renal disease: Secondary | ICD-10-CM | POA: Diagnosis not present

## 2023-10-11 DIAGNOSIS — D688 Other specified coagulation defects: Secondary | ICD-10-CM | POA: Diagnosis not present

## 2023-10-12 ENCOUNTER — Other Ambulatory Visit (HOSPITAL_COMMUNITY)

## 2023-10-18 DIAGNOSIS — D688 Other specified coagulation defects: Secondary | ICD-10-CM | POA: Diagnosis not present

## 2023-10-18 DIAGNOSIS — Z992 Dependence on renal dialysis: Secondary | ICD-10-CM | POA: Diagnosis not present

## 2023-10-18 DIAGNOSIS — T8249XA Other complication of vascular dialysis catheter, initial encounter: Secondary | ICD-10-CM | POA: Diagnosis not present

## 2023-10-18 DIAGNOSIS — N186 End stage renal disease: Secondary | ICD-10-CM | POA: Diagnosis not present

## 2023-10-18 DIAGNOSIS — N2581 Secondary hyperparathyroidism of renal origin: Secondary | ICD-10-CM | POA: Diagnosis not present

## 2023-10-19 DIAGNOSIS — G43909 Migraine, unspecified, not intractable, without status migrainosus: Secondary | ICD-10-CM | POA: Diagnosis not present

## 2023-10-19 DIAGNOSIS — E113593 Type 2 diabetes mellitus with proliferative diabetic retinopathy without macular edema, bilateral: Secondary | ICD-10-CM | POA: Diagnosis not present

## 2023-10-19 DIAGNOSIS — Z992 Dependence on renal dialysis: Secondary | ICD-10-CM | POA: Diagnosis not present

## 2023-10-19 DIAGNOSIS — H02833 Dermatochalasis of right eye, unspecified eyelid: Secondary | ICD-10-CM | POA: Diagnosis not present

## 2023-10-19 DIAGNOSIS — N186 End stage renal disease: Secondary | ICD-10-CM | POA: Diagnosis not present

## 2023-10-19 DIAGNOSIS — H02836 Dermatochalasis of left eye, unspecified eyelid: Secondary | ICD-10-CM | POA: Diagnosis not present

## 2023-10-19 DIAGNOSIS — E1122 Type 2 diabetes mellitus with diabetic chronic kidney disease: Secondary | ICD-10-CM | POA: Diagnosis not present

## 2023-10-19 DIAGNOSIS — H35371 Puckering of macula, right eye: Secondary | ICD-10-CM | POA: Diagnosis not present

## 2023-10-20 DIAGNOSIS — D631 Anemia in chronic kidney disease: Secondary | ICD-10-CM | POA: Diagnosis not present

## 2023-10-20 DIAGNOSIS — D688 Other specified coagulation defects: Secondary | ICD-10-CM | POA: Diagnosis not present

## 2023-10-20 DIAGNOSIS — N2581 Secondary hyperparathyroidism of renal origin: Secondary | ICD-10-CM | POA: Diagnosis not present

## 2023-10-20 DIAGNOSIS — Z992 Dependence on renal dialysis: Secondary | ICD-10-CM | POA: Diagnosis not present

## 2023-10-20 DIAGNOSIS — N186 End stage renal disease: Secondary | ICD-10-CM | POA: Diagnosis not present

## 2023-10-20 DIAGNOSIS — D500 Iron deficiency anemia secondary to blood loss (chronic): Secondary | ICD-10-CM | POA: Diagnosis not present

## 2023-10-20 DIAGNOSIS — T8249XA Other complication of vascular dialysis catheter, initial encounter: Secondary | ICD-10-CM | POA: Diagnosis not present

## 2023-10-21 ENCOUNTER — Encounter (HOSPITAL_COMMUNITY): Payer: Self-pay | Admitting: Emergency Medicine

## 2023-10-21 ENCOUNTER — Inpatient Hospital Stay (HOSPITAL_COMMUNITY)

## 2023-10-21 ENCOUNTER — Emergency Department (HOSPITAL_COMMUNITY)

## 2023-10-21 ENCOUNTER — Ambulatory Visit: Admitting: Physician Assistant

## 2023-10-21 ENCOUNTER — Inpatient Hospital Stay (HOSPITAL_COMMUNITY)
Admission: EM | Admit: 2023-10-21 | Discharge: 2023-10-23 | DRG: 280 | Disposition: A | Discharge status: Home Health | Attending: Internal Medicine | Admitting: Internal Medicine

## 2023-10-21 ENCOUNTER — Encounter (HOSPITAL_COMMUNITY)
Admission: EM | Disposition: A | Discharge status: Home Health | Payer: Self-pay | Source: Home / Self Care | Attending: Internal Medicine

## 2023-10-21 DIAGNOSIS — Z833 Family history of diabetes mellitus: Secondary | ICD-10-CM

## 2023-10-21 DIAGNOSIS — Z8249 Family history of ischemic heart disease and other diseases of the circulatory system: Secondary | ICD-10-CM

## 2023-10-21 DIAGNOSIS — Z794 Long term (current) use of insulin: Secondary | ICD-10-CM

## 2023-10-21 DIAGNOSIS — Z992 Dependence on renal dialysis: Secondary | ICD-10-CM | POA: Diagnosis not present

## 2023-10-21 DIAGNOSIS — Z9049 Acquired absence of other specified parts of digestive tract: Secondary | ICD-10-CM | POA: Diagnosis not present

## 2023-10-21 DIAGNOSIS — D631 Anemia in chronic kidney disease: Secondary | ICD-10-CM | POA: Diagnosis present

## 2023-10-21 DIAGNOSIS — R0989 Other specified symptoms and signs involving the circulatory and respiratory systems: Secondary | ICD-10-CM | POA: Diagnosis not present

## 2023-10-21 DIAGNOSIS — M8580 Other specified disorders of bone density and structure, unspecified site: Secondary | ICD-10-CM | POA: Diagnosis not present

## 2023-10-21 DIAGNOSIS — Z79890 Hormone replacement therapy (postmenopausal): Secondary | ICD-10-CM | POA: Diagnosis not present

## 2023-10-21 DIAGNOSIS — N186 End stage renal disease: Secondary | ICD-10-CM | POA: Diagnosis present

## 2023-10-21 DIAGNOSIS — E039 Hypothyroidism, unspecified: Secondary | ICD-10-CM | POA: Diagnosis not present

## 2023-10-21 DIAGNOSIS — Z953 Presence of xenogenic heart valve: Secondary | ICD-10-CM

## 2023-10-21 DIAGNOSIS — Z981 Arthrodesis status: Secondary | ICD-10-CM | POA: Diagnosis not present

## 2023-10-21 DIAGNOSIS — E876 Hypokalemia: Secondary | ICD-10-CM | POA: Diagnosis present

## 2023-10-21 DIAGNOSIS — I959 Hypotension, unspecified: Secondary | ICD-10-CM

## 2023-10-21 DIAGNOSIS — Z950 Presence of cardiac pacemaker: Secondary | ICD-10-CM

## 2023-10-21 DIAGNOSIS — I4510 Unspecified right bundle-branch block: Secondary | ICD-10-CM | POA: Diagnosis not present

## 2023-10-21 DIAGNOSIS — E785 Hyperlipidemia, unspecified: Secondary | ICD-10-CM | POA: Diagnosis not present

## 2023-10-21 DIAGNOSIS — I5032 Chronic diastolic (congestive) heart failure: Secondary | ICD-10-CM | POA: Diagnosis not present

## 2023-10-21 DIAGNOSIS — Z882 Allergy status to sulfonamides status: Secondary | ICD-10-CM

## 2023-10-21 DIAGNOSIS — Z452 Encounter for adjustment and management of vascular access device: Secondary | ICD-10-CM | POA: Diagnosis not present

## 2023-10-21 DIAGNOSIS — I214 Non-ST elevation (NSTEMI) myocardial infarction: Secondary | ICD-10-CM

## 2023-10-21 DIAGNOSIS — I2510 Atherosclerotic heart disease of native coronary artery without angina pectoris: Secondary | ICD-10-CM | POA: Diagnosis not present

## 2023-10-21 DIAGNOSIS — Z881 Allergy status to other antibiotic agents status: Secondary | ICD-10-CM | POA: Diagnosis not present

## 2023-10-21 DIAGNOSIS — Z79899 Other long term (current) drug therapy: Secondary | ICD-10-CM

## 2023-10-21 DIAGNOSIS — Z83719 Family history of colon polyps, unspecified: Secondary | ICD-10-CM

## 2023-10-21 DIAGNOSIS — Z91018 Allergy to other foods: Secondary | ICD-10-CM

## 2023-10-21 DIAGNOSIS — R079 Chest pain, unspecified: Secondary | ICD-10-CM | POA: Diagnosis not present

## 2023-10-21 DIAGNOSIS — I120 Hypertensive chronic kidney disease with stage 5 chronic kidney disease or end stage renal disease: Secondary | ICD-10-CM | POA: Diagnosis not present

## 2023-10-21 DIAGNOSIS — Z887 Allergy status to serum and vaccine status: Secondary | ICD-10-CM

## 2023-10-21 DIAGNOSIS — Z7982 Long term (current) use of aspirin: Secondary | ICD-10-CM

## 2023-10-21 DIAGNOSIS — M1990 Unspecified osteoarthritis, unspecified site: Secondary | ICD-10-CM | POA: Diagnosis not present

## 2023-10-21 DIAGNOSIS — I132 Hypertensive heart and chronic kidney disease with heart failure and with stage 5 chronic kidney disease, or end stage renal disease: Secondary | ICD-10-CM | POA: Diagnosis not present

## 2023-10-21 DIAGNOSIS — E1122 Type 2 diabetes mellitus with diabetic chronic kidney disease: Secondary | ICD-10-CM | POA: Diagnosis present

## 2023-10-21 DIAGNOSIS — Z807 Family history of other malignant neoplasms of lymphoid, hematopoietic and related tissues: Secondary | ICD-10-CM

## 2023-10-21 HISTORY — PX: LEFT HEART CATH AND CORONARY ANGIOGRAPHY: CATH118249

## 2023-10-21 LAB — TROPONIN I (HIGH SENSITIVITY)
Troponin I (High Sensitivity): 600 ng/L (ref <18)
Troponin I (High Sensitivity): 1746 ng/L (ref <18)
Troponin I (High Sensitivity): 6361 ng/L (ref <18)
Troponin I (High Sensitivity): 15653 ng/L (ref <18)

## 2023-10-21 LAB — ECHOCARDIOGRAM COMPLETE
AR max vel: 1.29 cm2
AV Area VTI: 1.29 cm2
AV Area mean vel: 1.31 cm2
AV Mean grad: 4.5 mmHg
AV Peak grad: 8.1 mmHg
Ao pk vel: 1.43 m/s
Area-P 1/2: 2.87 cm2
Height: 61 in
S' Lateral: 3.2 cm
Weight: 2776.03 [oz_av]

## 2023-10-21 LAB — CBC WITH DIFFERENTIAL/PLATELET
Abs Immature Granulocytes: 0.04 K/uL (ref 0.00–0.07)
Basophils Absolute: 0.1 K/uL (ref 0.0–0.1)
Basophils Relative: 2 %
Eosinophils Absolute: 0.1 K/uL (ref 0.0–0.5)
Eosinophils Relative: 1 %
HCT: 29 % — ABNORMAL LOW (ref 36.0–46.0)
Hemoglobin: 9.3 g/dL — ABNORMAL LOW (ref 12.0–15.0)
Immature Granulocytes: 1 %
Lymphocytes Relative: 20 %
Lymphs Abs: 1.1 K/uL (ref 0.7–4.0)
MCH: 28.8 pg (ref 26.0–34.0)
MCHC: 32.1 g/dL (ref 30.0–36.0)
MCV: 89.8 fL (ref 80.0–100.0)
Monocytes Absolute: 0.7 K/uL (ref 0.1–1.0)
Monocytes Relative: 13 %
Neutro Abs: 3.6 K/uL (ref 1.7–7.7)
Neutrophils Relative %: 63 %
Platelets: 195 K/uL (ref 150–400)
RBC: 3.23 MIL/uL — ABNORMAL LOW (ref 3.87–5.11)
RDW: 17.7 % — ABNORMAL HIGH (ref 11.5–15.5)
WBC: 5.7 K/uL (ref 4.0–10.5)
nRBC: 0 % (ref 0.0–0.2)

## 2023-10-21 LAB — BASIC METABOLIC PANEL WITH GFR
Anion gap: 14 (ref 5–15)
BUN: 17 mg/dL (ref 8–23)
CO2: 26 mmol/L (ref 22–32)
Calcium: 8.6 mg/dL — ABNORMAL LOW (ref 8.9–10.3)
Chloride: 99 mmol/L (ref 98–111)
Creatinine, Ser: 2.71 mg/dL — ABNORMAL HIGH (ref 0.44–1.00)
GFR, Estimated: 17 mL/min — ABNORMAL LOW (ref >60)
Glucose, Bld: 125 mg/dL — ABNORMAL HIGH (ref 70–99)
Potassium: 3.5 mmol/L (ref 3.5–5.1)
Sodium: 139 mmol/L (ref 135–145)

## 2023-10-21 LAB — GLUCOSE, CAPILLARY: Glucose-Capillary: 265 mg/dL — ABNORMAL HIGH (ref 70–99)

## 2023-10-21 LAB — I-STAT CHEM 8, ED
BUN: 18 mg/dL (ref 8–23)
Calcium, Ion: 1.02 mmol/L — ABNORMAL LOW (ref 1.15–1.40)
Chloride: 102 mmol/L (ref 98–111)
Creatinine, Ser: 3 mg/dL — ABNORMAL HIGH (ref 0.44–1.00)
Glucose, Bld: 119 mg/dL — ABNORMAL HIGH (ref 70–99)
HCT: 31 % — ABNORMAL LOW (ref 36.0–46.0)
Hemoglobin: 10.5 g/dL — ABNORMAL LOW (ref 12.0–15.0)
Potassium: 3.7 mmol/L (ref 3.5–5.1)
Sodium: 139 mmol/L (ref 135–145)
TCO2: 26 mmol/L (ref 22–32)

## 2023-10-21 LAB — HEMOGLOBIN A1C
Hgb A1c MFr Bld: 6.6 % — ABNORMAL HIGH (ref 4.8–5.6)
Mean Plasma Glucose: 142.72 mg/dL

## 2023-10-21 LAB — CBG MONITORING, ED
Glucose-Capillary: 167 mg/dL — ABNORMAL HIGH (ref 70–99)
Glucose-Capillary: 122 mg/dL — ABNORMAL HIGH (ref 70–99)

## 2023-10-21 LAB — LIPASE, BLOOD: Lipase: 18 U/L (ref 11–51)

## 2023-10-21 LAB — HEPARIN LEVEL (UNFRACTIONATED): Heparin Unfractionated: <0.1 [IU]/mL — ABNORMAL LOW (ref 0.30–0.70)

## 2023-10-21 LAB — HEPATITIS B SURFACE ANTIGEN: Hepatitis B Surface Ag: NONREACTIVE

## 2023-10-21 SURGERY — LEFT HEART CATH AND CORONARY ANGIOGRAPHY
Anesthesia: LOCAL

## 2023-10-21 MED ORDER — SODIUM CHLORIDE 0.9% FLUSH: 3.0000 mL | INTRAVENOUS | Status: DC | PRN

## 2023-10-21 MED ORDER — HEPARIN BOLUS VIA INFUSION
4000.0000 [IU] | Freq: Once | INTRAVENOUS | Status: AC
  Administered 2023-10-21: 4000 [IU] via INTRAVENOUS
  Filled 2023-10-21: qty 4000

## 2023-10-21 MED ORDER — NITROGLYCERIN 0.4 MG SL SUBL
0.4000 mg | SUBLINGUAL_TABLET | SUBLINGUAL | Status: DC | PRN
  Administered 2023-10-22 – 2023-10-23 (×2): 0.4 mg via SUBLINGUAL

## 2023-10-21 MED ORDER — ASPIRIN 81 MG PO TBEC
81.0000 mg | DELAYED_RELEASE_TABLET | Freq: Every day | ORAL | Status: DC
  Administered 2023-10-22 – 2023-10-23 (×2): 81 mg via ORAL
  Filled 2023-10-21 (×2): qty 1

## 2023-10-21 MED ORDER — LEVOTHYROXINE SODIUM 50 MCG PO TABS
50.0000 ug | ORAL_TABLET | ORAL | Status: DC
  Administered 2023-10-22: 50 ug via ORAL
  Filled 2023-10-21: qty 1

## 2023-10-21 MED ORDER — PANTOPRAZOLE SODIUM 40 MG IV SOLR
40.0000 mg | Freq: Two times a day (BID) | INTRAVENOUS | Status: DC
  Administered 2023-10-21 – 2023-10-23 (×4): 40 mg via INTRAVENOUS
  Filled 2023-10-21 (×5): qty 10

## 2023-10-21 MED ORDER — IOHEXOL 350 MG/ML SOLN
INTRAVENOUS | Status: DC | PRN
  Administered 2023-10-21: 110 mL

## 2023-10-21 MED ORDER — SODIUM CHLORIDE 0.9% FLUSH
3.0000 mL | Freq: Two times a day (BID) | INTRAVENOUS | Status: DC
  Administered 2023-10-21 – 2023-10-22 (×2): 3 mL via INTRAVENOUS

## 2023-10-21 MED ORDER — SODIUM CHLORIDE 0.9 % IV SOLN: 250.0000 mL | INTRAVENOUS | Status: DC | PRN

## 2023-10-21 MED ORDER — HYDRALAZINE HCL 20 MG/ML IJ SOLN: 10.0000 mg | INTRAMUSCULAR | Status: DC | PRN

## 2023-10-21 MED ORDER — CLOPIDOGREL BISULFATE 75 MG PO TABS
75.0000 mg | ORAL_TABLET | Freq: Every day | ORAL | Status: DC
  Administered 2023-10-21 – 2023-10-23 (×3): 75 mg via ORAL
  Filled 2023-10-21 (×3): qty 1

## 2023-10-21 MED ORDER — ACETAMINOPHEN 325 MG PO TABS
650.0000 mg | ORAL_TABLET | ORAL | Status: DC | PRN
  Administered 2023-10-21: 650 mg via ORAL
  Filled 2023-10-21: qty 2

## 2023-10-21 MED ORDER — EZETIMIBE 10 MG PO TABS
10.0000 mg | ORAL_TABLET | Freq: Every day | ORAL | Status: DC
  Administered 2023-10-22 – 2023-10-23 (×2): 10 mg via ORAL
  Filled 2023-10-21 (×3): qty 1

## 2023-10-21 MED ORDER — INSULIN ASPART 100 UNIT/ML IJ SOLN
0.0000 [IU] | INTRAMUSCULAR | Status: DC
  Administered 2023-10-21: 3 [IU] via SUBCUTANEOUS

## 2023-10-21 MED ORDER — ONDANSETRON HCL 4 MG/2ML IJ SOLN: 4.0000 mg | Freq: Four times a day (QID) | INTRAMUSCULAR | Status: DC | PRN

## 2023-10-21 MED ORDER — LIDOCAINE HCL (PF) 1 % IJ SOLN
INTRAMUSCULAR | Status: DC | PRN
  Administered 2023-10-21: 5 mL via INTRADERMAL

## 2023-10-21 MED ORDER — FENTANYL CITRATE (PF) 100 MCG/2ML IJ SOLN
INTRAMUSCULAR | Status: AC
  Filled 2023-10-21: qty 2

## 2023-10-21 MED ORDER — INSULIN ASPART 100 UNIT/ML IJ SOLN
0.0000 [IU] | Freq: Three times a day (TID) | INTRAMUSCULAR | Status: DC
  Administered 2023-10-22: 7 [IU] via SUBCUTANEOUS
  Administered 2023-10-22: 1 [IU] via SUBCUTANEOUS
  Administered 2023-10-23 (×2): 2 [IU] via SUBCUTANEOUS

## 2023-10-21 MED ORDER — POTASSIUM CHLORIDE 10 MEQ/100ML IV SOLN
10.0000 meq | INTRAVENOUS | Status: AC
  Administered 2023-10-21 (×3): 10 meq via INTRAVENOUS
  Filled 2023-10-21 (×3): qty 100

## 2023-10-21 MED ORDER — CHLORHEXIDINE GLUCONATE CLOTH 2 % EX PADS
6.0000 | MEDICATED_PAD | Freq: Every day | CUTANEOUS | Status: DC
  Administered 2023-10-22 – 2023-10-23 (×2): 6 via TOPICAL

## 2023-10-21 MED ORDER — FENTANYL CITRATE (PF) 100 MCG/2ML IJ SOLN
INTRAMUSCULAR | Status: DC | PRN
  Administered 2023-10-21: 25 ug via INTRAVENOUS

## 2023-10-21 MED ORDER — LEVOTHYROXINE SODIUM 100 MCG PO TABS
100.0000 ug | ORAL_TABLET | ORAL | Status: DC
  Administered 2023-10-23: 100 ug via ORAL
  Filled 2023-10-21: qty 1

## 2023-10-21 MED ORDER — HEPARIN (PORCINE) IN NACL 1000-0.9 UT/500ML-% IV SOLN
INTRAVENOUS | Status: DC | PRN
  Administered 2023-10-21: 1000 mL

## 2023-10-21 MED ORDER — HYDRALAZINE HCL 20 MG/ML IJ SOLN: 10.0000 mg | INTRAMUSCULAR | Status: AC | PRN

## 2023-10-21 MED ORDER — HEPARIN (PORCINE) 25000 UT/250ML-% IV SOLN
1300.0000 [IU]/h | INTRAVENOUS | Status: DC
  Administered 2023-10-21: 1300 [IU]/h via INTRAVENOUS
  Filled 2023-10-21: qty 250

## 2023-10-21 MED ORDER — LABETALOL HCL 5 MG/ML IV SOLN: 10.0000 mg | INTRAVENOUS | Status: AC | PRN

## 2023-10-21 MED ORDER — LIDOCAINE HCL (PF) 1 % IJ SOLN
INTRAMUSCULAR | Status: AC
  Filled 2023-10-21: qty 30

## 2023-10-21 SURGICAL SUPPLY — 10 items
CATH INFINITI 5FR MULTPACK ANG (CATHETERS) IMPLANT
CLOSURE MYNX CONTROL 5F (Vascular Products) IMPLANT
PACK CARDIAC CATHETERIZATION (CUSTOM PROCEDURE TRAY) ×2 IMPLANT
SET ATX-X65L (MISCELLANEOUS) IMPLANT
SHEATH PINNACLE 5F 10CM (SHEATH) IMPLANT
SHEATH PROBE COVER 6X72 (BAG) IMPLANT
SYR CONTROL 10ML ANGIOGRAPHIC (SYRINGE) IMPLANT
TUBING CIL FLEX 10 FLL-RA (TUBING) IMPLANT
WIRE EMERALD 3MM-J .035X260CM (WIRE) IMPLANT
WIRE MICRO SET SILHO 5FR 7 (SHEATH) IMPLANT

## 2023-10-21 NOTE — Interval H&P Note (Signed)
 History and Physical Interval Note:  10/21/2023 2:19 PM  Rhonda Clayton  has presented today for surgery, with the diagnosis of nstemi.  The various methods of treatment have been discussed with the patient and family. After consideration of risks, benefits and other options for treatment, the patient has consented to  Procedure(s): LEFT HEART CATH AND CORONARY ANGIOGRAPHY (N/A) as a surgical intervention.  The patient's history has been reviewed, patient examined, no change in status, stable for surgery.  I have reviewed the patient's chart and labs.  Questions were answered to the patient's satisfaction.     Spiro Ausborn K Luria Rosario

## 2023-10-21 NOTE — Progress Notes (Signed)
 PHARMACY - ANTICOAGULATION CONSULT NOTE  Pharmacy Consult for heparin  Indication: chest pain/ACS  Allergies  Allergen Reactions   Coffee Flavoring Agent (Non-Screening) Other (See Comments)    Sick on stomach, sneezing, backed up   Azithromycin  Other (See Comments)   Fluogen [Influenza Virus Vaccine]    Molds & Smuts     Allergy test showed    Pneumococcal Vac Polyvalent Other (See Comments)    Large site reaction    Tobacco Nausea And Vomiting   Versed  [Midazolam ] Other (See Comments)    No memory altering medications. Patient request.   Cephalexin  Other (See Comments)    Yeast infection TOLERATED CEFAZOLIN  PRIOR   Ciprofloxacin Other (See Comments)    tendinitis   Oatmeal Other (See Comments)    Sneezing and drainage   Sulfa Antibiotics Other (See Comments)    Yeast infection   Patient Measurements: Height: 5' 1 (154.9 cm) Weight: 78.7 kg (173 lb 8 oz) IBW/kg (Calculated) : 47.8 HEPARIN  DW (KG): 65.4  Vital Signs: Temp: 97.9 F (36.6 C) (10/02 0734) Temp Source: Oral (10/02 0734) BP: 152/72 (10/02 0745) Pulse Rate: 71 (10/02 0745)  Labs: Recent Labs    10/21/23 0750  HGB 9.3*  HCT 29.0*  PLT 195  CREATININE 2.21*  TROPONINIHS 600*   Estimated Creatinine Clearance: 19.3 mL/min (A) (by C-G formula based on SCr of 2.21 mg/dL (H)).  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 Verlin and Dr. Lucas.   Stress incontinence    Torn rotator cuff    Bilateral    Type 2 diabetes mellitus (HCC)    Venous insufficiency    Vitreous hemorrhage, left (HCC) 05/25/2019   The nature of the vitreous hemorrhage was discussed with the patient as well as the common causes.   Patients with diabetic eye disease may develop retinal neovascularization.  Other eye conditions develop retinal  neovascularization secondary to retinal venous occlusions.   Vitreous hemorrhage may result from spontaneous vitreous detachment or retinal breaks.  Blunt trauma is a common cause as wl   Medications:  Scheduled:   potassium chloride      Assessment: Patient is an 78 YO F presenting with chest pain and shortness of breath. Initial troponin elevated at 600. Pharmacy consulted to dose heparin  for ACS. Patient not on anticoagulation PTA.   Initial Hgb 9.3 and Plt 195; previous Hgb 8s-10s in July 2025. No issues with bleeding documented. Patient previously received heparin  infusion in July and was therapeutic on rate of 1300 units/h.   Goal of Therapy:  Heparin  level 0.3-0.7 units/ml Monitor platelets by anticoagulation protocol: Yes   Plan:  Give 4000 units bolus x 1 Start heparin  infusion at 1300 units/h.  Check 1st heparin  level in 8h.  Monitor heparin  level, CBC, and s/sx bleeding daily.   Maurilio Patten, PharmD PGY1 Pharmacy Resident [facility]  10/21/2023 9:22 AM

## 2023-10-21 NOTE — H&P (View-Only) (Signed)
 Cardiology Consultation   Patient ID: ALEA RYER MRN: [id number]; DOB: 1945/12/20  Admit date: 10/21/2023 Date of Consult: 10/21/2023  PCP:  [name], MD   [facility] Providers Cardiologist:  Lurena MARLA Red, MD  Electrophysiologist:  Soyla Gladis Norton, MD  Structural Heart:  [name], MD  Patient Profile: Rhonda Clayton is a 78 y.o. female with a hx of diffuse CAD, chronic HFmrEF, low-flow low gradient status post TAVR, hypertension, hyperlipidemia, diabetes, ESRD on HD, trifascicular block status post leadless PM who is being seen 10/21/2023 for the evaluation of chest pain/NSTEMI at the request of Dr. Tobie.  History of Present Illness: Rhonda Clayton is an 78 year old female with past medical history noted above.  Known history of aortic stenosis and was initially referred for TAVR workup to Dr. Red.  Wore a monitor in the setting of bradycardia 03/2023 which showed sinus pauses in the setting of trifascicular block.  Was ultimately referred to Dr. Nancey who felt a leadless Medtronic Micra AV would be best if deemed appropriate TAVR candidate.  Underwent a nuclear med PET stress test 06/08/2023 which showed peri-infarct ischemia in the entire LAD distribution.  Admitted 06/12/2023 with CHF and echocardiogram showed LVEF of 35 to 40%, hypokinesis in the mid-apical, anterior, anterior lateral and apical walls with grade 2 diastolic dysfunction, moderate MAC with mild MR and severe low-flow low gradient aortic stenosis with a mean gradient of 26 mmHg.  She was diuresed with IV Lasix  but this was limited by her CKD stage V.  She was readmitted shortly thereafter and started on HD with tunneled catheter placed.  Underwent left heart cath 06/2023 which showed diffuse coronary disease of mid to distal LAD, diagonal and circumflex.  Lesions were felt to be relatively distal and not explaining the patient's cardiomyopathy.  Given her lack of chest pain she was  continued on medical management.  Presented to the ED 07/2023 for sinus pauses and presyncope and found to have up to an 8-second pause on telemetry.  Temp pacer was placed and she was admitted to the ICU.  Given the need for sedation prior to the pacemaker placement it was felt that she should proceed with TAVR prior to device.  Underwent successful TAVR 07/27/2023 and then taken for dual chamber leadless pacemaker on 07/29/2023.  She was seen back in the structural heart clinic on 08/05/2023 with Izetta Hummer, PA.  Reported doing well since pacemaker placement and TAVR.  Presented to the ED on 10/2 with complaints of chest pain that started around 2-3am this morning, centralized chest pressure with radiation into the left arm. Thought it might be GERD and took a TUMS without relief. Symptoms lingered for several hours and she eventually called 911. Given a SL NTG in route with some improvement in symptoms.   Labs on admission showed a sodium of 141, potassium 2.9, creatinine 2.21, high-sensitivity troponin 600, WBC 5.7, hemoglobin 9.3.  Chest x-ray with mild pulmonary congestion.  EKG shows sinus rhythm, right bundle branch block with a pacing.   Chest pain has subsided but continues to have left arm pain.    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 Verlin and Dr. Lucas.   Stress incontinence    Torn rotator cuff    Bilateral   Type 2 diabetes mellitus (HCC)    Venous insufficiency    Vitreous  hemorrhage, left (HCC) 05/25/2019   The nature of the vitreous hemorrhage was discussed with the patient as well as the common causes.   Patients with diabetic eye disease may develop retinal neovascularization.  Other eye conditions develop retinal  neovascularization secondary to retinal venous occlusions.   Vitreous hemorrhage may result from spontaneous vitreous detachment or retinal breaks.  Blunt trauma is a common cause as wl    Past Surgical History:  Procedure Laterality Date   ABDOMINAL AORTOGRAM N/A 07/14/2023   Procedure: ABDOMINAL AORTOGRAM;  Surgeon: Wendel Lurena POUR, MD;  Location: [facility];  Service: Cardiovascular;  Laterality: N/A;   ABDOMINAL EXPOSURE N/A 05/09/2014   Procedure: ABDOMINAL EXPOSURE;  Surgeon: Krystal JULIANNA Doing, MD;  Location: [facility];  Service: Vascular;  Laterality: N/A;   ANTERIOR LAT LUMBAR FUSION Right 05/09/2014   Procedure: Lumbar three-four, Lumbar four-five Anterior lateral lumbar fusion;  Surgeon: Fairy Levels, MD;  Location: [facility];  Service: Neurosurgery;  Laterality: Right;   ANTERIOR LUMBAR FUSION N/A 05/09/2014   Procedure: Stage 1:Lumbar four-five, Lumbar five-Sacral one Anterior lumbar interbody fusion with Dr. Doing for approach ;  Surgeon: Fairy Levels, MD;  Location: [facility];  Service: Neurosurgery;  Laterality: N/A;   CHOLECYSTECTOMY  1987   INCONTINENCE SURGERY     INSERTION OF DIALYSIS CATHETER Right 06/17/2023   Procedure: INSERTION OF DIALYSIS CATHETER;  Surgeon: Pearline Norman RAMAN, MD;  Location: [facility];  Service: Vascular;  Laterality: Right;   INTRAOPERATIVE TRANSTHORACIC ECHOCARDIOGRAM N/A 07/27/2023   Procedure: ECHOCARDIOGRAM, TRANSTHORACIC;  Surgeon: Verlin Lonni BIRCH, MD;  Location: [facility];  Service: Cardiovascular;  Laterality: N/A;   LEFT HEART CATH AND CORONARY ANGIOGRAPHY N/A 07/14/2023   Procedure: LEFT HEART CATH AND CORONARY ANGIOGRAPHY;  Surgeon: Wendel Lurena POUR, MD;  Location: [facility];  Service: Cardiovascular;  Laterality: N/A;   LUMBAR PERCUTANEOUS PEDICLE SCREW 3 LEVEL N/A 05/10/2014   Procedure: Stage 2: Percutaneous Pedicle Screws; Laminectomy L3-4  ;  Surgeon: Fairy Levels, MD;  Location: [facility];  Service: Neurosurgery;  Laterality: N/A;  Stage 2: Percutaneous Pedicle Screws T10 to Ileum; Laminectomy L3-4     PACEMAKER LEADLESS INSERTION N/A 07/29/2023   Procedure: PACEMAKER LEADLESS INSERTION;  Surgeon: Nancey Eulas BRAVO, MD;  Location: [facility];  Service: Cardiovascular;  Laterality: N/A;   RECTAL SURGERY     TEMPORARY PACEMAKER N/A 07/23/2023   Procedure: TEMPORARY PACEMAKER;  Surgeon: Verlin Lonni BIRCH, MD;  Location: [facility];  Service: Cardiovascular;  Laterality: N/A;   TUNNELLED CATHETER EXCHANGE N/A 07/22/2023   Procedure: TUNNELLED CATHETER EXCHANGE;  Surgeon: Serene Gaile ORN, MD;  Location: [facility];  Service: Cardiovascular;  Laterality: N/A;   VAGINA SURGERY  2018   Almetta Planas Touch- Vaginal Dryness     Scheduled Meds:  [START ON 10/22/2023] aspirin  EC  81 mg Oral Daily   ezetimibe   10 mg Oral Daily   insulin  aspart  0-15 Units Subcutaneous Q4H   [  START ON 10/23/2023] levothyroxine   100 mcg Oral Once per day on Sunday Tuesday Wednesday Thursday Saturday   [START ON 10/22/2023] levothyroxine   50 mcg Oral Once per day on Monday Friday   pantoprazole  (PROTONIX ) IV  40 mg Intravenous Q12H   Continuous Infusions:  heparin  1,300 Units/hr (10/21/23 0954)   potassium chloride  10 mEq (10/21/23 1106)   PRN Meds: acetaminophen , nitroGLYCERIN   Allergies:    Allergies  Allergen Reactions   Coffee Flavoring Agent (Non-Screening) Other (See Comments)    Sick on stomach, sneezing, backed up   Azithromycin  Other (See Comments)   Fluogen [Influenza Virus Vaccine]    Molds & Smuts     Allergy test showed    Pneumococcal Vac Polyvalent Other (See Comments)    Large site reaction    Tobacco Nausea And Vomiting   Versed   [Midazolam ] Other (See Comments)    No memory altering medications. Patient request.   Cephalexin  Other (See Comments)    Yeast infection TOLERATED CEFAZOLIN  PRIOR   Ciprofloxacin Other (See Comments)    tendinitis   Oatmeal Other (See Comments)    Sneezing and drainage   Sulfa Antibiotics Other (See Comments)    Yeast infection    Social History:   Social History   Socioeconomic History   Marital status: Widowed    Spouse name: Not on file   Number of children: 2   Years of education: Not on file   Highest education level: Not on file  Occupational History   Occupation: Retired  Tobacco Use   Smoking status: Never   Smokeless tobacco: Never  Vaping Use   Vaping status: Never Used  Substance and Sexual Activity   Alcohol  use: No   Drug use: Yes    Types: Hydromorphone    Sexual activity: Not on file  Other Topics Concern   Not on file  Social History Narrative   Not on file   Social Drivers of Health   Financial Resource Strain: Not on file  Food Insecurity: No Food Insecurity (07/23/2023)   Hunger Vital Sign    Worried About Running Out of Food in the Last Year: Never true    Ran Out of Food in the Last Year: Never true  Transportation Needs: No Transportation Needs (07/23/2023)   PRAPARE - Administrator, Civil Service (Medical): No    Lack of Transportation (Non-Medical): No  Physical Activity: Not on file  Stress: Not on file  Social Connections: Patient Declined (07/23/2023)   Social Connection and Isolation Panel    Frequency of Communication with Friends and Family: Patient declined    Frequency of Social Gatherings with Friends and Family: Patient declined    Attends Religious Services: Patient declined    Database administrator or Organizations: Patient declined    Attends Banker Meetings: Patient declined    Marital Status: Patient declined  Intimate Partner Violence: Not At Risk (07/23/2023)   Humiliation, Afraid, Rape, and Kick  questionnaire    Fear of Current or Ex-Partner: No    Emotionally Abused: No    Physically Abused: No    Sexually Abused: No    Family History:    Family History  Problem Relation Age of Onset   Multiple myeloma Mother    Cancer Maternal Aunt        Cancer in the mouth - dipped snuff   Heart disease Brother    Diabetes Father    Colon polyps Brother    Heart  disease Maternal Uncle    Heart disease Paternal Uncle    Heart disease Paternal Grandfather      ROS:  Please see the history of present illness.   All other ROS reviewed and negative.     Physical Exam/Data: Vitals:   10/21/23 0830 10/21/23 0845 10/21/23 1000 10/21/23 1030  BP: 97/83 (!) 148/61 (!) 115/58 (!) 147/68  Pulse: 67 67 67 68  Resp: 15 18 15 14   Temp:      TempSrc:      SpO2: 100% 99% 98% 100%  Weight:      Height:       No intake or output data in the 24 hours ending 10/21/23 1110    10/21/2023    7:28 AM 08/06/2023    2:00 PM 08/05/2023    3:19 PM  Last 3 Weights  Weight (lbs) 173 lb 8 oz 174 lb 8 oz 176 lb 12.8 oz  Weight (kg) 78.7 kg 79.153 kg 80.196 kg     Body mass index is 32.78 kg/m.  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  Telemetry:  Telemetry was personally reviewed and demonstrates:  Sinus Rhythm, A pacing   Relevant CV Studies:  Cath: 07/14/2023    1st Diag lesion is 100% stenosed.   Mid LAD lesion is 80% stenosed.   Dist LAD-1 lesion is 99% stenosed.   Dist  LAD-2 lesion is 80% stenosed.   Prox Cx to Mid Cx lesion is 80% stenosed.   Mid Cx to Dist Cx lesion is 70% stenosed.   1.  Diffuse coronary artery disease of the mid to distal LAD, diagonal, and left circumflex systems.  These are lesions are relatively distal and do not explain the patient's cardiomyopathy.  Given the lack of chest pain he is will be treated medically. 2.  Capacious iliofemoral vessels bilaterally; if TAVR is pursued the right side seems to be less tortuous.  The femoral bifurcations are well below the inferior margins of the femoral head bilaterally.   Recommendation: Continue evaluation for aortic valve intervention.   Diagnostic Dominance: Right  Echo: 07/2023  IMPRESSIONS     1. S/P TAVR with mean gradient 6 mmHg and no AI; LV function improved  compared to previous.   2. Left ventricular ejection fraction, by estimation, is 50 to 55%. The  left ventricle has low normal function. The left ventricle has no regional  wall motion abnormalities. There is moderate left ventricular hypertrophy.  Left ventricular diastolic  parameters are indeterminate.   3. Right ventricular systolic function is normal. The right ventricular  size is normal.   4. Left atrial size was moderately dilated.   5. The mitral valve is normal in structure. No evidence of mitral valve  regurgitation. No evidence of mitral stenosis. Moderate mitral annular  calcification.   6. The aortic valve has been repaired/replaced. Aortic valve  regurgitation is not visualized. No aortic stenosis is present. There is a  26 mm Sapien prosthetic (TAVR)  valve present in the aortic position. Echo  findings are consistent with normal  structure and function of the aortic valve prosthesis.   7. The inferior vena cava is dilated in size with <50% respiratory  variability, suggesting right atrial pressure of 15 mmHg.   FINDINGS   Left Ventricle: Left ventricular ejection fraction, by estimation, is 50  to  55%. The left ventricle has low normal function. The left ventricle has  no regional wall motion abnormalities. The left ventricular internal  cavity size was normal in size.  There is moderate left ventricular hypertrophy. Left ventricular diastolic  parameters are indeterminate.   Right Ventricle: The right ventricular size is normal. Right ventricular  systolic function is normal.   Left Atrium: Left atrial size was moderately dilated.   Right Atrium: Right atrial size was normal in size.   Pericardium: There is no evidence of pericardial effusion.   Mitral Valve: The mitral valve is normal in structure. Moderate mitral  annular calcification. No evidence of mitral valve regurgitation. No  evidence of mitral valve stenosis. MV peak gradient, 3.8 mmHg. The mean  mitral valve gradient is 2.0 mmHg.   Tricuspid Valve: The tricuspid valve is normal in structure. Tricuspid  valve regurgitation is trivial. No evidence of tricuspid stenosis.   Aortic Valve: The aortic valve has been repaired/replaced. Aortic valve  regurgitation is not visualized. No aortic stenosis is present. Aortic  valve mean gradient measures 6.0 mmHg. Aortic valve peak gradient measures  10.1 mmHg. Aortic valve area, by VTI   measures 0.88 cm. There is a 26 mm Sapien prosthetic, stented (TAVR)  valve present in the aortic position. Echo findings are consistent with  normal structure and function of the aortic valve prosthesis.   Pulmonic Valve: The pulmonic valve was not well visualized. Pulmonic valve  regurgitation is not visualized. No evidence of pulmonic stenosis.   Aorta: The aortic root is normal in size and structure.   Venous: The inferior vena cava is dilated in size with less than 50%  respiratory variability, suggesting right atrial pressure of 15 mmHg.   IAS/Shunts: The interatrial septum was not well visualized.   Additional Comments: S/P TAVR with mean gradient 6 mmHg and no AI; LV  function  improved compared to previous.    Laboratory Data: High Sensitivity Troponin:   Recent Labs  Lab 10/21/23 0750  TROPONINIHS 600*     Chemistry Recent Labs  Lab 10/21/23 0750  NA 141  K 2.5*  CL 105  CO2 21*  GLUCOSE 184*  BUN 15  CREATININE 2.21*  CALCIUM  7.2*  GFRNONAA 22*  ANIONGAP 15    Recent Labs  Lab 10/21/23 0750  PROT 5.6*  ALBUMIN  2.7*  AST 26  ALT 13  ALKPHOS 66  BILITOT 0.5   Lipids No results for input(s): CHOL, TRIG, HDL, LABVLDL, LDLCALC, CHOLHDL in the last 168 hours.  Hematology Recent Labs  Lab 10/21/23 0750  WBC 5.7  RBC 3.23*  HGB 9.3*  HCT 29.0*  MCV 89.8  MCH 28.8  MCHC 32.1  RDW 17.7*  PLT 195   Thyroid  No results for input(s): TSH, FREET4 in the last 168 hours.  BNPNo results for input(s): BNP, PROBNP in the last 168 hours.  DDimer No results for input(s): DDIMER in the last 168 hours.  Radiology/Studies:  DG Chest 2 View Result Date: 10/21/2023 CLINICAL DATA:  Chest pain EXAM: CHEST - 2 VIEW COMPARISON:  07/30/2023 FINDINGS: Low volume film. Cardiopericardial silhouette is at upper  limits of normal for size. Status post TAVR. Right IJ central line tip overlies the distal SVC level. There is pulmonary vascular congestion without overt pulmonary edema. Telemetry leads overlie the chest. IMPRESSION: Low volume film with pulmonary vascular congestion. Electronically Signed   By: Camellia Candle M.D.   On: 10/21/2023 08:25     Assessment and Plan:  ABENA ERDMAN is a 78 y.o. female with a hx of diffuse CAD, chronic HFmrEF, low-flow low gradient status post TAVR, hypertension, hyperlipidemia, diabetes, ESRD on HD, trifascicular block status post leadless PM who is being seen 10/21/2023 for the evaluation of chest pain/NSTEMI at the request of Dr. Tobie.  NSTEMI Multivessel CAD -- Cardiac cath 06/2023 with 80% mid LAD stenosis, distal LAD stenosis of 80 and 99%, proximal/mid circumflex stenosis of 80% followed by  70%, 100% first diagonal lesion.  These were felt to be relatively distal and treated medically at the time -- Presents back with centralized chest pain and that started around 2 to 3 AM this morning with radiation into her left arm.  -- High-sensitivity troponin 600>>>1746, EKG with right bundle branch block, a pacing -- Given sublingual nitroglycerin  via EMS and route with improvement in her symptoms, but continues to have left arm pain -- Discussed with patient regarding concerns for progression of CAD with recommendations for cardiac catheterization.  She is agreeable to proceed -- Continue IV heparin , aspirin , Zetia   Informed Consent   Shared Decision Making/Informed Consent{  The risks [stroke (1 in 1000), death (1 in 1000), kidney failure [usually temporary] (1 in 500), bleeding (1 in 200), allergic reaction [possibly serious] (1 in 200)], benefits (diagnostic support and management of coronary artery disease) and alternatives of a cardiac catheterization were discussed in detail with Rhonda Clayton and she is willing to proceed.     LFLG AS s/p TAVR -- Underwent TAVR via transfemoral approach 07/27/2023 -- no significant murmur on exam, but did not follow up for her repeat outpatient echo after TAVR. Repeat echo   ESRD on HD -- on MWF schedule, had HD yesterday without issues. Does not appear volume overloaded on exam  Sinus pauses s/p leadless PPM -- follow by EP, A pacing on telemetry  Anemia of chronic disease -- 2/2 renal disease, Hgb stable at 9.3  Hypokalemia -- K+ 2.5, supplement   Risk Assessment/Risk Scores:    TIMI Risk Score for Unstable Angina or Non-ST Elevation MI:   The patient's TIMI risk score is  , which indicates a  % risk of all cause mortality, new or recurrent myocardial infarction or need for urgent revascularization in the next 14 days.    For questions or updates, please contact [facility] Please consult [email or web address] for contact info under    Signed, Manuelita Rummer, NP  10/21/2023 11:10 AM

## 2023-10-21 NOTE — Consult Note (Signed)
 Renal Service Consult Note [facility] [name] Fret, MD  Patient: Rhonda Clayton Date: 10/21/2023 Requesting Physician: Dr. Tobie, E.   Reason for Consult: ESRD pt w/ NSTEMI HPI: The patient is a 78 y.o. year-old w/ PMH as below who presented to ED early this morning c/o chest pain. In ED BP was 168/72, HR 72, RR 18, temp 99.2.  96-100% on RA.  Labs showed sodium 139 potassium 2.5, CO2 21, BUN 15, creatinine 2.2, albumin  2.7. Troponin 600, 1746, 6361.  WBC 5K, Hgb 9.3, platelets normal.  Chest x-ray showed mild bilateral vascular congestion without edema. Seen by cardiology and plan is for [facility] today. Pt admitted. 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    Last admit in July 2025 pt had TAVR on 7/8 followed by leadless PM on 07/29/23.  Has done well since per cardiology notes.    Pt seen in ED room. In good spirits, no CP at this time. IV heparin  running. No leg swelling or SOB.    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 Verlin and Dr. Lucas.   Stress incontinence    Torn rotator cuff    Bilateral   Type 2 diabetes mellitus (HCC)    Venous insufficiency    Vitreous hemorrhage, left (HCC) 05/25/2019   The nature of the vitreous hemorrhage was discussed with the patient as well as the common causes.   Patients with diabetic eye disease may develop retinal  neovascularization.  Other eye conditions develop retinal  neovascularization secondary to retinal venous occlusions.   Vitreous hemorrhage may result from spontaneous vitreous detachment or retinal breaks.  Blunt trauma is a common cause as wl   Past Surgical History  Past Surgical History:  Procedure Laterality Date   ABDOMINAL AORTOGRAM N/A 07/14/2023   Procedure: ABDOMINAL AORTOGRAM;  Surgeon: Wendel Lurena POUR, MD;  Location: [facility];  Service: Cardiovascular;  Laterality: N/A;   ABDOMINAL EXPOSURE N/A 05/09/2014   Procedure: ABDOMINAL EXPOSURE;  Surgeon: Krystal JULIANNA Doing, MD;  Location: [facility];  Service: Vascular;  Laterality: N/A;   ANTERIOR LAT LUMBAR FUSION Right 05/09/2014   Procedure: Lumbar three-four, Lumbar four-five Anterior lateral lumbar fusion;  Surgeon: Fairy Levels, MD;  Location: [facility];  Service: Neurosurgery;  Laterality: Right;   ANTERIOR LUMBAR FUSION N/A 05/09/2014   Procedure: Stage 1:Lumbar four-five, Lumbar five-Sacral one Anterior lumbar interbody fusion with Dr. Doing for approach ;  Surgeon: Fairy Levels, MD;  Location: [facility];  Service: Neurosurgery;  Laterality: N/A;   CHOLECYSTECTOMY  1987   INCONTINENCE SURGERY     INSERTION OF DIALYSIS CATHETER Right 06/17/2023   Procedure: INSERTION OF DIALYSIS CATHETER;  Surgeon: Pearline Norman RAMAN, MD;  Location: [facility]  OR;  Service: Vascular;  Laterality: Right;   INTRAOPERATIVE TRANSTHORACIC ECHOCARDIOGRAM N/A 07/27/2023   Procedure: ECHOCARDIOGRAM, TRANSTHORACIC;  Surgeon: Verlin Lonni BIRCH, MD;  Location: [facility];  Service: Cardiovascular;  Laterality: N/A;   LEFT HEART CATH AND CORONARY ANGIOGRAPHY N/A 07/14/2023   Procedure: LEFT HEART CATH AND CORONARY ANGIOGRAPHY;  Surgeon: Wendel Lurena POUR, MD;  Location: [facility];  Service: Cardiovascular;  Laterality: N/A;   LUMBAR PERCUTANEOUS PEDICLE SCREW 3 LEVEL N/A 05/10/2014   Procedure: Stage 2: Percutaneous Pedicle Screws; Laminectomy  L3-4  ;  Surgeon: Fairy Levels, MD;  Location: [facility];  Service: Neurosurgery;  Laterality: N/A;  Stage 2: Percutaneous Pedicle Screws T10 to Ileum; Laminectomy L3-4     PACEMAKER LEADLESS INSERTION N/A 07/29/2023   Procedure: PACEMAKER LEADLESS INSERTION;  Surgeon: Nancey Eulas BRAVO, MD;  Location: [facility];  Service: Cardiovascular;  Laterality: N/A;   RECTAL SURGERY     TEMPORARY PACEMAKER N/A 07/23/2023   Procedure: TEMPORARY PACEMAKER;  Surgeon: Verlin Lonni BIRCH, MD;  Location: [facility];  Service: Cardiovascular;  Laterality: N/A;   TUNNELLED CATHETER EXCHANGE N/A 07/22/2023   Procedure: TUNNELLED CATHETER EXCHANGE;  Surgeon: Serene Gaile ORN, MD;  Location: [facility];  Service: Cardiovascular;  Laterality: N/A;   VAGINA SURGERY  2018   Almetta Planas Touch- Vaginal Dryness   Family History  Family History  Problem Relation Age of Onset   Multiple myeloma Mother    Cancer Maternal Aunt        Cancer in the mouth - dipped snuff   Heart disease Brother    Diabetes Father    Colon polyps Brother    Heart disease Maternal Uncle    Heart disease Paternal Uncle    Heart disease Paternal Grandfather    Social History  reports that she has never smoked. She has never used smokeless tobacco. 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 She reports that she does not drink alcohol . Allergies  Allergies  Allergen Reactions   Coffee Flavoring Agent (Non-Screening) Other (See Comments)    Sick on stomach, sneezing, backed up   Azithromycin  Other (See Comments)   Fluogen [Influenza Virus Vaccine]    Molds & Smuts     Allergy test showed    Pneumococcal Vac Polyvalent Other (See Comments)    Large site reaction    Tobacco Nausea And Vomiting   Versed  [Midazolam ] Other (See Comments)    No memory altering medications. Patient request.   Cephalexin  Other (See Comments)    Yeast infection TOLERATED CEFAZOLIN  PRIOR   Ciprofloxacin Other (See Comments)     tendinitis   Oatmeal Other (See Comments)    Sneezing and drainage   Sulfa Antibiotics Other (See Comments)    Yeast infection   Home medications Prior to Admission medications   Medication Sig Start Date End Date Taking? Authorizing Provider  amoxicillin  (AMOXIL ) 500 MG capsule Take 2,000 mg by mouth once as needed (Dental Procedures).   Yes [provider]  aspirin  EC 81 MG tablet Take 1 tablet (81 mg total) by mouth daily. Swallow whole. 06/16/23  Yes Henry Shaver B, NP  betamethasone valerate (VALISONE) 0.1 % cream Apply 1 Application topically as needed (Dermatitis). 02/23/23  Yes [provider]  Coenzyme Q10 (COQ-10 PO) Take 1 tablet by mouth at bedtime.   Yes [provider]  D-MANNOSE PO Take 1 tablet by mouth at bedtime.   Yes [provider]  ezetimibe  (  ZETIA ) 10 MG tablet Take 10 mg by mouth daily.   Yes [provider]  Folic Acid -Vit B6-Vit B12 (FOLBEE) 2.5-25-1 MG TABS tablet Take 1 tablet by mouth at bedtime.   Yes [provider]  furosemide  (LASIX ) 40 MG tablet Take 1 tablet (40 mg total) by mouth See admin instructions. Take on non-dialysis days. 07/31/23  Yes Goodrich, Callie E, PA-C  levothyroxine  (SYNTHROID ) 100 MCG tablet Take 50-100 mcg by mouth See admin instructions. Take 100mcg (1 tablet) by mouth daily except on Monday and Friday's, take 50mcg (1/2 tablet).   Yes [provider]  midodrine  (PROAMATINE ) 5 MG tablet Take 1 tablet (5 mg total) by mouth every Monday, Wednesday, and Friday with hemodialysis. 08/02/23  Yes Jadine, Callie E, PA-C  nitroGLYCERIN  (NITROSTAT ) 0.4 MG SL tablet Place 1 tablet (0.4 mg total) under the tongue every 5 (five) minutes as needed for chest pain. 06/01/23 10/21/23 Yes Camnitz, Will Gladis, MD  NOVOLOG  MIX 70/30 FLEXPEN (70-30) 100 UNIT/ML FlexPen Inject 9-15 Units into the skin in the morning and at bedtime. Inject 14-15 units into the skin every morning and 9-10 units at  before dinner. According to how many carbohydrates she eats.   Yes [provider]  pantoprazole  (PROTONIX ) 40 MG tablet Take 40 mg by mouth daily as needed (Heartburn). 06/03/23  Yes [provider]  fosfomycin (MONUROL ) 3 g PACK Take 3 g by mouth once. Patient not taking: Reported on 10/21/2023 09/25/23   [provider]     Vitals:   10/21/23 1130 10/21/23 1200 10/21/23 1300 10/21/23 1415  BP: (!) 147/71 (!) 118/98 123/60 (!) 115/56  Pulse: 67 77 66 72  Resp: 12 (!) 22 16 14   Temp:      TempSrc:      SpO2: 99% 96% 97% 97%  Weight:      Height:       Exam Gen alert, no distress, on RA Sclera anicteric, throat clear  No jvd or bruits Chest clear bilat to bases RRR no MRG Abd soft ntnd no mass or ascites +bs Ext no LE or UE edema, no other edema Neuro is alert, Ox 3 , nf      Home bp meds: Midodrine  5mg  pre hd MWF Lasix  40mg  every day on non hd days   OP HD: [facility] MWF  3.5h  B350  76.9kg  2K bath  TDC  Heparin  4000 Mircera 100 mcg q 2 wks, just given on 10/1   Assessment/ Plan: Chest pain/ NSTEMI: troponins ^^, going for LHC today.  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 No missed HD. Plan HD tomorrow.  BP: not on any bp lowering meds at home. BP's wnl here.  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 Just rec'd esa, follow.  H/o TAVR H/o leadless PM       Rob Marin Wisner  MD [facility] 10/21/2023, 2:39 PM  Recent Labs  Lab 10/21/23 0750 10/21/23 1420  HGB 9.3* 10.5*  ALBUMIN  2.7*  --   CALCIUM  7.2*  --   CREATININE 2.21* 3.00*  K 2.5* 3.7   Inpatient medications:  [START ON 10/22/2023] aspirin  EC  81 mg Oral Daily   ezetimibe   10 mg Oral Daily   insulin  aspart  0-15 Units Subcutaneous Q4H   [START ON 10/23/2023] levothyroxine   100 mcg Oral Once per day on Sunday Tuesday Wednesday Thursday Saturday   [START ON 10/22/2023] levothyroxine   50 mcg Oral Once  per day on Monday Friday   pantoprazole  (PROTONIX ) IV  40 mg  Intravenous Q12H    heparin  1,300 Units/hr (10/21/23 1133)   acetaminophen , hydrALAZINE , nitroGLYCERIN 

## 2023-10-21 NOTE — ED Notes (Signed)
 Reported called to Cath Lab RN. Cath Lab techs at bedside to take patient to CL.

## 2023-10-21 NOTE — H&P (Signed)
 History and Physical    Patient: Rhonda Clayton FMW:[id number] DOB: 1945-04-24 DOA: 10/21/2023 DOS: the patient was seen and examined on 10/21/2023 . PCP: Avva, Ravisankar, MD  Patient coming from: Home Chief complaint: Chief Complaint  Patient presents with   Chest Pain   HPI:  Rhonda Clayton is a 78 y.o. 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 She was then taken for Dual Chamber Leadless PPM on the next available day which was 07/29/2023.  Since her recent procedure she has been doing well.  Patient woke up early this morning around 3 AM with midsternal chest pain radiating to her left arm pain was constant.  Patient did not think to take her sublingual nitroglycerin  because she thought it was indigestion and she called her daughter, who eventually called the ambulance and brought patient to the hospital.   ED Course:  Vital signs in the ED were notable for the following:  Vitals:   10/21/23 0845 10/21/23 1000 10/21/23 1030 10/21/23 1128  BP: (!) 148/61 (!) 115/58 (!) 147/68   Pulse: 67 67 68   Temp:    99.2 F (37.3 C)  Resp: 18 15 14    Height:      Weight:      SpO2: 99% 98% 100%   TempSrc:    Oral  BMI (Calculated):       >>ED evaluation thus far shows: - Abnormal CMP with a potassium of 2.5 bicarb 21 glucose 184 creatinine 2.21 calcium  7.2 magnesium added on and pending, LFTs showing albumin  of 2.7 total protein 5.6. -TNI of 600, EKG shows atrial paced rhythm at 72 PR 196 right bundle branch block noted QRS 151 LVH and prolonged QTc of 550. - Abnormal CBC with a white count of 5.7 hemoglobin of 9.3 RDW 17.7 MCV of 89.8 and platelets of 195.  CBC trend shows patient has been anemic from 2009 which I suspect is a combination of anemia of chronic disease and will defer to PCP for evaluation of any  anemia of blood loss or nutritional deficiencies.  Currently hemoglobin is stable if it drops we will certainly pursue additional measures for evaluation-type and screen views as deemed appropriate.    >>While in the ED patient received the following: Medications  potassium chloride  10 mEq in 100 mL IVPB (10 mEq Intravenous New Bag/Given 10/21/23 1000)  heparin  ADULT infusion 100 units/mL (25000 units/250mL) (1,300 Units/hr Intravenous New Bag/Given 10/21/23 0954)  heparin  bolus via infusion 4,000 Units (4,000 Units Intravenous Bolus from Bag 10/21/23 0954)   Review of Systems  Cardiovascular:  Positive for chest pain.   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 Verlin and Dr. Lucas.   Stress incontinence    Torn rotator cuff    Bilateral  Type 2 diabetes mellitus (HCC)    Venous insufficiency    Vitreous hemorrhage, left (HCC) 05/25/2019   The nature of the vitreous hemorrhage was discussed with the patient as well as the common causes.   Patients with diabetic eye disease may develop retinal neovascularization.  Other eye conditions develop retinal  neovascularization secondary to retinal venous occlusions.   Vitreous hemorrhage may result from spontaneous vitreous detachment or retinal breaks.  Blunt trauma is a common cause as wl   Past Surgical History:  Procedure Laterality Date   ABDOMINAL AORTOGRAM N/A 07/14/2023   Procedure: ABDOMINAL AORTOGRAM;  Surgeon: Wendel Lurena POUR, MD;  Location: [facility];  Service: Cardiovascular;  Laterality: N/A;   ABDOMINAL EXPOSURE N/A 05/09/2014   Procedure: ABDOMINAL EXPOSURE;  Surgeon: Krystal JULIANNA Doing, MD;  Location: [facility];  Service: Vascular;  Laterality: N/A;   ANTERIOR LAT LUMBAR FUSION Right 05/09/2014   Procedure: Lumbar three-four, Lumbar four-five Anterior lateral lumbar fusion;  Surgeon: Fairy Levels, MD;  Location: [facility];  Service: Neurosurgery;  Laterality: Right;   ANTERIOR LUMBAR FUSION N/A 05/09/2014   Procedure: Stage 1:Lumbar four-five, Lumbar five-Sacral one Anterior lumbar interbody fusion with Dr. Doing for approach ;  Surgeon: Fairy Levels, MD;  Location: [facility];  Service: Neurosurgery;  Laterality: N/A;   CHOLECYSTECTOMY  1987   INCONTINENCE SURGERY     INSERTION OF DIALYSIS CATHETER Right 06/17/2023   Procedure: INSERTION OF DIALYSIS CATHETER;  Surgeon: Pearline Norman RAMAN, MD;  Location: [facility] OR;  Service: Vascular;  Laterality: Right;   INTRAOPERATIVE TRANSTHORACIC ECHOCARDIOGRAM N/A 07/27/2023   Procedure: ECHOCARDIOGRAM, TRANSTHORACIC;  Surgeon: Verlin Lonni BIRCH, MD;  Location: [facility];  Service: Cardiovascular;  Laterality: N/A;   LEFT HEART CATH AND CORONARY ANGIOGRAPHY N/A 07/14/2023   Procedure: LEFT HEART CATH AND CORONARY ANGIOGRAPHY;  Surgeon: Wendel Lurena POUR, MD;  Location: [facility];  Service: Cardiovascular;  Laterality: N/A;   LUMBAR PERCUTANEOUS PEDICLE SCREW 3 LEVEL N/A 05/10/2014   Procedure: Stage 2: Percutaneous Pedicle Screws; Laminectomy L3-4  ;  Surgeon: Fairy Levels, MD;  Location: [facility];  Service: Neurosurgery;  Laterality: N/A;  Stage 2: Percutaneous Pedicle Screws T10 to Ileum; Laminectomy L3-4     PACEMAKER LEADLESS INSERTION N/A 07/29/2023   Procedure: PACEMAKER LEADLESS INSERTION;  Surgeon: Nancey Eulas BRAVO, MD;  Location: [facility];  Service: Cardiovascular;  Laterality: N/A;   RECTAL SURGERY     TEMPORARY PACEMAKER N/A 07/23/2023    Procedure: TEMPORARY PACEMAKER;  Surgeon: Verlin Lonni BIRCH, MD;  Location: [facility];  Service: Cardiovascular;  Laterality: N/A;   TUNNELLED CATHETER EXCHANGE N/A 07/22/2023   Procedure: TUNNELLED CATHETER EXCHANGE;  Surgeon: Serene Gaile ORN, MD;  Location: [facility];  Service: Cardiovascular;  Laterality: N/A;   VAGINA SURGERY  2018   [facility] Touch- Vaginal Dryness    reports that she has never smoked. She has never used smokeless tobacco. 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 She reports that she does not drink alcohol . Allergies  Allergen Reactions   Coffee Flavoring Agent (Non-Screening) Other (See Comments)    Sick on stomach, sneezing, backed up   Azithromycin  Other (See Comments)   Fluogen [Influenza Virus Vaccine]    Molds & Smuts     Allergy test showed    Pneumococcal Vac Polyvalent Other (See Comments)    Large site reaction    Tobacco Nausea And Vomiting   Versed  [Midazolam ]  Other (See Comments)    No memory altering medications. Patient request.   Cephalexin  Other (See Comments)    Yeast infection TOLERATED CEFAZOLIN  PRIOR   Ciprofloxacin Other (See Comments)    tendinitis   Oatmeal Other (See Comments)    Sneezing and drainage   Sulfa Antibiotics Other (See Comments)    Yeast infection   Family History  Problem Relation Age of Onset   Multiple myeloma Mother    Cancer Maternal Aunt        Cancer in the mouth - dipped snuff   Heart disease Brother    Diabetes Father    Colon polyps Brother    Heart disease Maternal Uncle    Heart disease Paternal Uncle    Heart disease Paternal Grandfather    Prior to Admission medications   Medication Sig Start Date End Date Taking? Authorizing Provider  amoxicillin  (AMOXIL ) 500 MG capsule Take 2,000 mg by mouth once as needed (Dental Procedures).   Yes [provider]  aspirin  EC 81 MG tablet Take 1 tablet (81 mg total) by mouth daily. Swallow whole. 06/16/23  Yes Henry Shaver B,  NP  betamethasone valerate (VALISONE) 0.1 % cream Apply 1 Application topically as needed (Dermatitis). 02/23/23  Yes [provider]  Coenzyme Q10 (COQ-10 PO) Take 1 tablet by mouth at bedtime.   Yes [provider]  D-MANNOSE PO Take 1 tablet by mouth at bedtime.   Yes [provider]  ezetimibe  (ZETIA ) 10 MG tablet Take 10 mg by mouth daily.   Yes [provider]  Folic Acid -Vit B6-Vit B12 (FOLBEE) 2.5-25-1 MG TABS tablet Take 1 tablet by mouth at bedtime.   Yes [provider]  furosemide  (LASIX ) 40 MG tablet Take 1 tablet (40 mg total) by mouth See admin instructions. Take on non-dialysis days. 07/31/23  Yes Goodrich, Callie E, PA-C  levothyroxine  (SYNTHROID ) 100 MCG tablet Take 50-100 mcg by mouth See admin instructions. Take 100mcg (1 tablet) by mouth daily except on Monday and Friday's, take 50mcg (1/2 tablet).   Yes [provider]  midodrine  (PROAMATINE ) 5 MG tablet Take 1 tablet (5 mg total) by mouth every Monday, Wednesday, and Friday with hemodialysis. 08/02/23  Yes Goodrich, Callie E, PA-C  nitroGLYCERIN  (NITROSTAT ) 0.4 MG SL tablet Place 1 tablet (0.4 mg total) under the tongue every 5 (five) minutes as needed for chest pain. 06/01/23 10/21/23 Yes Camnitz, Will Gladis, MD  NOVOLOG  MIX 70/30 FLEXPEN (70-30) 100 UNIT/ML FlexPen Inject 9-15 Units into the skin in the morning and at bedtime. Inject 14-15 units into the skin every morning and 9-10 units at before dinner. According to how many carbohydrates she eats.   Yes [provider]  pantoprazole  (PROTONIX ) 40 MG tablet Take 40 mg by mouth daily as needed (Heartburn). 06/03/23  Yes [provider]  fosfomycin (MONUROL ) 3 g PACK Take 3 g by mouth once. Patient not taking: Reported on 10/21/2023 09/25/23   [provider]                                                                                 Vitals:   10/21/23 0845 10/21/23 1000 10/21/23 1030 10/21/23 1128  BP: (!) 148/61 (!) 115/58 (!) 147/68   Pulse: 67 67 68   Resp: 18 15 14    Temp:    99.2 F (37.3 C)  TempSrc:    Oral  SpO2: 99% 98% 100%   Weight:      Height:       Physical Exam Vitals and nursing note reviewed.  Constitutional:      General: She is not in acute distress.    Appearance: She is not ill-appearing.  HENT:     Head: Normocephalic and atraumatic.     Right Ear: Hearing normal.     Left Ear: Hearing normal.     Nose: No nasal deformity.     Mouth/Throat:     Lips: Pink.  Eyes:     General: Lids are normal.     Extraocular Movements: Extraocular movements intact.  Cardiovascular:     Rate and Rhythm: Normal rate and regular rhythm.     Heart sounds: Normal heart sounds.  Pulmonary:     Effort: Pulmonary effort is normal.     Breath sounds: Examination of the right-lower field reveals rales. Examination of the left-lower field reveals rales. Rales present.  Abdominal:     General: Bowel sounds are normal. There is no distension.     Palpations: Abdomen is soft. There is no mass.     Tenderness: There is no abdominal tenderness.  Musculoskeletal:     Right lower leg: No edema.     Left lower leg: No edema.  Skin:    General: Skin is warm.  Neurological:     General: No focal deficit present.     Mental Status: She is alert and oriented to person, place, and time.     Cranial Nerves: Cranial nerves 2-12 are intact.  Psychiatric:        Speech: Speech normal.     Labs on Admission: I have personally reviewed following labs and imaging studies CBC: Recent Labs  Lab 10/21/23 0750  WBC 5.7  NEUTROABS 3.6  HGB 9.3*  HCT 29.0*  MCV 89.8  PLT 195   Basic Metabolic Panel: Recent Labs  Lab 10/21/23 0750  NA 141  K 2.5*  CL 105  CO2 21*  GLUCOSE 184*  BUN 15  CREATININE 2.21*  CALCIUM  7.2*   GFR: Estimated Creatinine Clearance: 19.3 mL/min (A) (by C-G formula based on SCr of 2.21 mg/dL (H)). Liver Function Tests: Recent Labs  Lab  10/21/23 0750  AST 26  ALT 13  ALKPHOS 66  BILITOT 0.5  PROT 5.6*  ALBUMIN  2.7*   Recent Labs  Lab 10/21/23 0750  LIPASE 18   No results for input(s): AMMONIA in the last 168 hours. Recent Labs    07/22/23 2307 07/24/23 0331 07/25/23 0257 07/26/23 0255 07/27/23 0731 07/28/23 0315 07/29/23 0312 07/30/23 0218 07/31/23 0333 10/21/23 0750  BUN 29* 20 33* 41* 30* 35* 27* 33* 26* 15  CREATININE 3.85* 2.72* 3.97* 4.33* 3.52* 3.91* 3.74* 4.16* 3.63* 2.21*    Cardiac Enzymes: No results for input(s): CKTOTAL, CKMB, CKMBINDEX, TROPONINI in the last 168 hours. BNP (last 3 results) No results for input(s): PROBNP in the last 8760 hours. HbA1C: No results for input(s): HGBA1C in the last 72 hours. CBG: No results for input(s): GLUCAP in the last 168 hours. Lipid Profile: No results for input(s): CHOL, HDL, LDLCALC, TRIG, CHOLHDL, LDLDIRECT in the last 72 hours. Thyroid  Function Tests: No results for input(s): TSH, T4TOTAL, FREET4, T3FREE, THYROIDAB in the  last 72 hours. Anemia Panel: No results for input(s): VITAMINB12, FOLATE, FERRITIN, TIBC, IRON , RETICCTPCT in the last 72 hours. Urine analysis:    Component Value Date/Time   COLORURINE STRAW (A) 07/22/2023 1802   APPEARANCEUR CLEAR 07/22/2023 1802   LABSPEC 1.008 07/22/2023 1802   PHURINE 8.0 07/22/2023 1802   GLUCOSEU 150 (A) 07/22/2023 1802   HGBUR NEGATIVE 07/22/2023 1802   BILIRUBINUR negative 09/25/2023 1209   BILIRUBINUR neg 03/01/2011 1243   KETONESUR negative 09/25/2023 1209   KETONESUR NEGATIVE 07/22/2023 1802   PROTEINUR =100 (A) 09/25/2023 1209   PROTEINUR 100 (A) 07/22/2023 1802   UROBILINOGEN 0.2 09/25/2023 1209   UROBILINOGEN 0.2 03/31/2007 0750   NITRITE Negative 09/25/2023 1209   NITRITE NEGATIVE 07/22/2023 1802   LEUKOCYTESUR Moderate (2+) (A) 09/25/2023 1209   LEUKOCYTESUR NEGATIVE 07/22/2023 1802   Radiological Exams on Admission: DG Chest 2  View Result Date: 10/21/2023 CLINICAL DATA:  Chest pain EXAM: CHEST - 2 VIEW COMPARISON:  07/30/2023 FINDINGS: Low volume film. Cardiopericardial silhouette is at upper limits of normal for size. Status post TAVR. Right IJ central line tip overlies the distal SVC level. There is pulmonary vascular congestion without overt pulmonary edema. Telemetry leads overlie the chest. IMPRESSION: Low volume film with pulmonary vascular congestion. Electronically Signed   By: Camellia Candle M.D.   On: 10/21/2023 08:25   Data Reviewed: Relevant notes from primary care and specialist visits, past discharge summaries as available in EHR, including Care Everywhere . Prior diagnostic testing as pertinent to current admission diagnoses, Updated medications and problem lists for reconciliation .ED course, including vitals, labs, imaging, treatment and response to treatment,Triage notes, nursing and pharmacy notes and ED provider's notes.Notable results as noted in HPI.Discussed case with EDMD/ ED APP/ or Specialty MD on call and as needed.  Assessment & Plan  Patient is a independent 78 year old with an extensive history of heart disease, heart block, congestive heart failure, end-stage renal disease on hemodialysis, diabetes mellitus type 2, hypothyroidism presenting with chest pain, with an initial troponin of 600 patient admitted for NSTEMI cardiology has been consulted  >> Chest pain/NSTEMI/ CAD: Will admit to cardiology unit , trend troponin and consult cardiology. Pt started on heparin  gtt. patient take aspirin  81 at home.   LHC in 06/2023 showed 80% stenosis of mid and 99% stenosis of distal LAD, 100% stenosis of D1, 80% stenosis of mid and 70% stenosis of distal LCX. No intervention was performed given diffuse mid to distal LAD and LCX disease and no ACS.   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  Volume management via hemodialysis.     >> End-stage renal disease on hemodialysis: Patient had her late hemodialysis session yesterday.  Nephrology consult for hemodialysis tomorrow.   >> Essential hypertension: Vitals:   10/21/23 0730 10/21/23 0734 10/21/23 0745 10/21/23 0800  BP: (!) 166/76 (!) 166/76 (!) 152/72 (!) 168/72   10/21/23 0815 10/21/23 0830 10/21/23 0845 10/21/23 1000  BP: (!) 166/75 97/83 (!) 148/61 (!) 115/58   10/21/23 1030  BP: (!) 147/68  As needed hydralazine .   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  Patient is also Aranesp .  >> Hypothyroidism: Continue patient on her current dose of levothyroxine  100 mcg.  >> Diabetes mellitus type 2: Most recent A1c of 6.5, patient started on glycemic protocol currently kept n.p.o. until cardiac evaluation.    DVT prophylaxis:  Heparin  GTT  Consults:  Cardiology Nephrology  Advance Care Planning:    Code Status: Full Code   Family Communication:  None Disposition Plan:  Home Severity of Illness: The appropriate patient status for this patient is INPATIENT. Inpatient status is judged to be reasonable and necessary in order to provide the required intensity of service to ensure the patient's safety. The patient's presenting symptoms, physical exam findings, and initial radiographic and laboratory data in the context of their chronic comorbidities is felt to place them at high risk for further clinical deterioration. Furthermore, it is not anticipated that the patient will be medically stable for discharge from the hospital within 2 midnights of admission.   * I certify that  at the point of admission it is my clinical judgment that the patient will require inpatient hospital care spanning beyond 2 midnights from the point of admission due to high intensity of service, high risk for further deterioration and high frequency of surveillance required.*  Unresulted Labs (From admission, onward)     Start     Ordered   10/22/23 0500  Heparin  level (unfractionated)  Daily,   R      10/21/23 0926   10/22/23 0500  CBC  Daily,   R      10/21/23 0926   10/22/23 0500  Lipid panel  Tomorrow morning,   R        10/21/23 1051   10/21/23 1800  Heparin  level (unfractionated)  Once-Timed,   URGENT        10/21/23 0926   10/21/23 1046  Hemoglobin A1c  Add-on,   AD       Comments: To assess prior glycemic control    10/21/23 1045            Meds ordered this encounter  Medications   potassium chloride  10 mEq in 100 mL IVPB   heparin  ADULT infusion 100 units/mL (25000 units/250mL)   heparin  bolus via infusion 4,000 Units   aspirin  EC tablet 81 mg   ezetimibe  (ZETIA ) tablet 10 mg   levothyroxine  (SYNTHROID ) tablet 100 mcg   nitroGLYCERIN  (NITROSTAT ) SL tablet 0.4 mg   pantoprazole  (PROTONIX ) injection 40 mg   insulin  aspart (novoLOG ) injection 0-15 Units    Correction coverage::   Moderate (average weight, post-op)    CBG < 70::   implement hypoglycemia protocol    CBG 70 - 120::   0 units    CBG 121 - 150::   2 units    CBG 151 - 200::   3 units    CBG 201 - 250::   5 units    CBG 251 - 300::   8 units    CBG 301 - 350::   11 units    CBG 351 - 400::   15 units    CBG > 400:   call MD and obtain STAT lab verification   levothyroxine  (SYNTHROID ) tablet 50 mcg   acetaminophen  (TYLENOL ) tablet 650 mg  hydrALAZINE  (APRESOLINE ) injection 10 mg     Orders Placed This Encounter  Procedures   DG Chest 2 View   Comprehensive metabolic panel   Lipase, blood   CBC with Differential   Heparin  level (unfractionated)   Heparin  level (unfractionated)   CBC    Hemoglobin A1c   Lipid panel   Diet NPO time specified   ED Cardiac monitoring   Apply Diabetes Mellitus Care Plan   STAT CBG when hypoglycemia is suspected. If treated, recheck every 15 minutes after each treatment until CBG >/= 70 mg/dl   Refer to Hypoglycemia Protocol Sidebar Report for treatment of CBG < 70 mg/dl   No HS correction Insulin    Refer to Sidebar Report Mobility Protocol for Adult Inpatient   Apply Angina, Rule Out Myocardial Infarction Care Plan   Vital signs with O2 sat q4 hours x 24 hours, then q shift   Cardiac Monitoring Continuous x 24 hours Indications for use: Other; other indications for use: chest pain   RN may order Cardiology PRN Orders utilizing Cardiology PRN medications (through manage orders) for the following patient needs:   Bed rest   Full code   heparin  per pharmacy consult   Inpatient consult to Cardiology   Consult to hospitalist   EKG 12-Lead   EKG 12-Lead (at 6am)   EKG 12-Lead (recurrent chest pain)   Saline lock IV   Admit to Inpatient (patient's expected length of stay will be greater than 2 midnights or inpatient only procedure)    Author: Mario LULLA Blanch, MD 12 pm- 8 pm. [facility]. 10/21/2023 11:31 AM Please note for any communication after hours contact [facility] Assigned provider on call on Amion.

## 2023-10-21 NOTE — ED Notes (Signed)
 Patient transported to X-ray

## 2023-10-21 NOTE — Consult Note (Signed)
 Cardiology Consultation   [name] MRN: [id number]; DOB: 1945/12/20  Admit date: 10/21/2023 Date of Consult: 10/21/2023  PCP:  [name], MD   [facility] Providers Cardiologist:  Lurena MARLA Red, MD  Electrophysiologist:  Soyla Gladis Norton, MD  Structural Heart:  [name], MD  Patient Profile: Rhonda Clayton is a 78 y.o. female with a hx of diffuse CAD, chronic HFmrEF, low-flow low gradient status post TAVR, hypertension, hyperlipidemia, diabetes, ESRD on HD, trifascicular block status post leadless PM who is being seen 10/21/2023 for the evaluation of chest pain/NSTEMI at the request of Dr. Tobie.  History of Present Illness: Rhonda Clayton is an 78 year old female with past medical history noted above.  Known history of aortic stenosis and was initially referred for TAVR workup to Dr. Red.  Wore a monitor in the setting of bradycardia 03/2023 which showed sinus pauses in the setting of trifascicular block.  Was ultimately referred to Dr. Nancey who felt a leadless Medtronic Micra AV would be best if deemed appropriate TAVR candidate.  Underwent a nuclear med PET stress test 06/08/2023 which showed peri-infarct ischemia in the entire LAD distribution.  Admitted 06/12/2023 with CHF and echocardiogram showed LVEF of 35 to 40%, hypokinesis in the mid-apical, anterior, anterior lateral and apical walls with grade 2 diastolic dysfunction, moderate MAC with mild MR and severe low-flow low gradient aortic stenosis with a mean gradient of 26 mmHg.  She was diuresed with IV Lasix  but this was limited by her CKD stage V.  She was readmitted shortly thereafter and started on HD with tunneled catheter placed.  Underwent left heart cath 06/2023 which showed diffuse coronary disease of mid to distal LAD, diagonal and circumflex.  Lesions were felt to be relatively distal and not explaining the patient's cardiomyopathy.  Given her lack of chest pain she was  continued on medical management.  Presented to the ED 07/2023 for sinus pauses and presyncope and found to have up to an 8-second pause on telemetry.  Temp pacer was placed and she was admitted to the ICU.  Given the need for sedation prior to the pacemaker placement it was felt that she should proceed with TAVR prior to device.  Underwent successful TAVR 07/27/2023 and then taken for dual chamber leadless pacemaker on 07/29/2023.  She was seen back in the structural heart clinic on 08/05/2023 with Izetta Hummer, PA.  Reported doing well since pacemaker placement and TAVR.  Presented to the ED on 10/2 with complaints of chest pain that started around 2-3am this morning, centralized chest pressure with radiation into the left arm. Thought it might be GERD and took a TUMS without relief. Symptoms lingered for several hours and she eventually called 911. Given a SL NTG in route with some improvement in symptoms.   Labs on admission showed a sodium of 141, potassium 2.9, creatinine 2.21, high-sensitivity troponin 600, WBC 5.7, hemoglobin 9.3.  Chest x-ray with mild pulmonary congestion.  EKG shows sinus rhythm, right bundle branch block with a pacing.   Chest pain has subsided but continues to have left arm pain.    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 Verlin and Dr. Lucas.   Stress incontinence    Torn rotator cuff    Bilateral   Type 2 diabetes mellitus (HCC)    Venous insufficiency    Vitreous  hemorrhage, left (HCC) 05/25/2019   The nature of the vitreous hemorrhage was discussed with the patient as well as the common causes.   Patients with diabetic eye disease may develop retinal neovascularization.  Other eye conditions develop retinal  neovascularization secondary to retinal venous occlusions.   Vitreous hemorrhage may result from spontaneous vitreous detachment or retinal breaks.  Blunt trauma is a common cause as wl    Past Surgical History:  Procedure Laterality Date   ABDOMINAL AORTOGRAM N/A 07/14/2023   Procedure: ABDOMINAL AORTOGRAM;  Surgeon: Wendel Lurena POUR, MD;  Location: [facility];  Service: Cardiovascular;  Laterality: N/A;   ABDOMINAL EXPOSURE N/A 05/09/2014   Procedure: ABDOMINAL EXPOSURE;  Surgeon: Krystal JULIANNA Doing, MD;  Location: [facility];  Service: Vascular;  Laterality: N/A;   ANTERIOR LAT LUMBAR FUSION Right 05/09/2014   Procedure: Lumbar three-four, Lumbar four-five Anterior lateral lumbar fusion;  Surgeon: Fairy Levels, MD;  Location: [facility];  Service: Neurosurgery;  Laterality: Right;   ANTERIOR LUMBAR FUSION N/A 05/09/2014   Procedure: Stage 1:Lumbar four-five, Lumbar five-Sacral one Anterior lumbar interbody fusion with Dr. Doing for approach ;  Surgeon: Fairy Levels, MD;  Location: [facility];  Service: Neurosurgery;  Laterality: N/A;   CHOLECYSTECTOMY  1987   INCONTINENCE SURGERY     INSERTION OF DIALYSIS CATHETER Right 06/17/2023   Procedure: INSERTION OF DIALYSIS CATHETER;  Surgeon: Pearline Norman RAMAN, MD;  Location: [facility];  Service: Vascular;  Laterality: Right;   INTRAOPERATIVE TRANSTHORACIC ECHOCARDIOGRAM N/A 07/27/2023   Procedure: ECHOCARDIOGRAM, TRANSTHORACIC;  Surgeon: Verlin Lonni BIRCH, MD;  Location: [facility];  Service: Cardiovascular;  Laterality: N/A;   LEFT HEART CATH AND CORONARY ANGIOGRAPHY N/A 07/14/2023   Procedure: LEFT HEART CATH AND CORONARY ANGIOGRAPHY;  Surgeon: Wendel Lurena POUR, MD;  Location: [facility];  Service: Cardiovascular;  Laterality: N/A;   LUMBAR PERCUTANEOUS PEDICLE SCREW 3 LEVEL N/A 05/10/2014   Procedure: Stage 2: Percutaneous Pedicle Screws; Laminectomy L3-4  ;  Surgeon: Fairy Levels, MD;  Location: [facility];  Service: Neurosurgery;  Laterality: N/A;  Stage 2: Percutaneous Pedicle Screws T10 to Ileum; Laminectomy L3-4     PACEMAKER LEADLESS INSERTION N/A 07/29/2023   Procedure: PACEMAKER LEADLESS INSERTION;  Surgeon: Nancey Eulas BRAVO, MD;  Location: [facility];  Service: Cardiovascular;  Laterality: N/A;   RECTAL SURGERY     TEMPORARY PACEMAKER N/A 07/23/2023   Procedure: TEMPORARY PACEMAKER;  Surgeon: Verlin Lonni BIRCH, MD;  Location: [facility];  Service: Cardiovascular;  Laterality: N/A;   TUNNELLED CATHETER EXCHANGE N/A 07/22/2023   Procedure: TUNNELLED CATHETER EXCHANGE;  Surgeon: Serene Gaile ORN, MD;  Location: [facility];  Service: Cardiovascular;  Laterality: N/A;   VAGINA SURGERY  2018   Almetta Planas Touch- Vaginal Dryness     Scheduled Meds:  [START ON 10/22/2023] aspirin  EC  81 mg Oral Daily   ezetimibe   10 mg Oral Daily   insulin  aspart  0-15 Units Subcutaneous Q4H   [  START ON 10/23/2023] levothyroxine   100 mcg Oral Once per day on Sunday Tuesday Wednesday Thursday Saturday   [START ON 10/22/2023] levothyroxine   50 mcg Oral Once per day on Monday Friday   pantoprazole  (PROTONIX ) IV  40 mg Intravenous Q12H   Continuous Infusions:  heparin  1,300 Units/hr (10/21/23 0954)   potassium chloride  10 mEq (10/21/23 1106)   PRN Meds: acetaminophen , nitroGLYCERIN   Allergies:    Allergies  Allergen Reactions   Coffee Flavoring Agent (Non-Screening) Other (See Comments)    Sick on stomach, sneezing, backed up   Azithromycin  Other (See Comments)   Fluogen [Influenza Virus Vaccine]    Molds & Smuts     Allergy test showed    Pneumococcal Vac Polyvalent Other (See Comments)    Large site reaction    Tobacco Nausea And Vomiting   Versed   [Midazolam ] Other (See Comments)    No memory altering medications. Patient request.   Cephalexin  Other (See Comments)    Yeast infection TOLERATED CEFAZOLIN  PRIOR   Ciprofloxacin Other (See Comments)    tendinitis   Oatmeal Other (See Comments)    Sneezing and drainage   Sulfa Antibiotics Other (See Comments)    Yeast infection    Social History:   Social History   Socioeconomic History   Marital status: Widowed    Spouse name: Not on file   Number of children: 2   Years of education: Not on file   Highest education level: Not on file  Occupational History   Occupation: Retired  Tobacco Use   Smoking status: Never   Smokeless tobacco: Never  Vaping Use   Vaping status: Never Used  Substance and Sexual Activity   Alcohol  use: No   Drug use: Yes    Types: Hydromorphone    Sexual activity: Not on file  Other Topics Concern   Not on file  Social History Narrative   Not on file   Social Drivers of Health   Financial Resource Strain: Not on file  Food Insecurity: No Food Insecurity (07/23/2023)   Hunger Vital Sign    Worried About Running Out of Food in the Last Year: Never true    Ran Out of Food in the Last Year: Never true  Transportation Needs: No Transportation Needs (07/23/2023)   PRAPARE - Administrator, Civil Service (Medical): No    Lack of Transportation (Non-Medical): No  Physical Activity: Not on file  Stress: Not on file  Social Connections: Patient Declined (07/23/2023)   Social Connection and Isolation Panel    Frequency of Communication with Friends and Family: Patient declined    Frequency of Social Gatherings with Friends and Family: Patient declined    Attends Religious Services: Patient declined    Database administrator or Organizations: Patient declined    Attends Banker Meetings: Patient declined    Marital Status: Patient declined  Intimate Partner Violence: Not At Risk (07/23/2023)   Humiliation, Afraid, Rape, and Kick  questionnaire    Fear of Current or Ex-Partner: No    Emotionally Abused: No    Physically Abused: No    Sexually Abused: No    Family History:    Family History  Problem Relation Age of Onset   Multiple myeloma Mother    Cancer Maternal Aunt        Cancer in the mouth - dipped snuff   Heart disease Brother    Diabetes Father    Colon polyps Brother    Heart  disease Maternal Uncle    Heart disease Paternal Uncle    Heart disease Paternal Grandfather      ROS:  Please see the history of present illness.   All other ROS reviewed and negative.     Physical Exam/Data: Vitals:   10/21/23 0830 10/21/23 0845 10/21/23 1000 10/21/23 1030  BP: 97/83 (!) 148/61 (!) 115/58 (!) 147/68  Pulse: 67 67 67 68  Resp: 15 18 15 14   Temp:      TempSrc:      SpO2: 100% 99% 98% 100%  Weight:      Height:       No intake or output data in the 24 hours ending 10/21/23 1110    10/21/2023    7:28 AM 08/06/2023    2:00 PM 08/05/2023    3:19 PM  Last 3 Weights  Weight (lbs) 173 lb 8 oz 174 lb 8 oz 176 lb 12.8 oz  Weight (kg) 78.7 kg 79.153 kg 80.196 kg     Body mass index is 32.78 kg/m.  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  Telemetry:  Telemetry was personally reviewed and demonstrates:  Sinus Rhythm, A pacing   Relevant CV Studies:  Cath: 07/14/2023    1st Diag lesion is 100% stenosed.   Mid LAD lesion is 80% stenosed.   Dist LAD-1 lesion is 99% stenosed.   Dist  LAD-2 lesion is 80% stenosed.   Prox Cx to Mid Cx lesion is 80% stenosed.   Mid Cx to Dist Cx lesion is 70% stenosed.   1.  Diffuse coronary artery disease of the mid to distal LAD, diagonal, and left circumflex systems.  These are lesions are relatively distal and do not explain the patient's cardiomyopathy.  Given the lack of chest pain he is will be treated medically. 2.  Capacious iliofemoral vessels bilaterally; if TAVR is pursued the right side seems to be less tortuous.  The femoral bifurcations are well below the inferior margins of the femoral head bilaterally.   Recommendation: Continue evaluation for aortic valve intervention.   Diagnostic Dominance: Right  Echo: 07/2023  IMPRESSIONS     1. S/P TAVR with mean gradient 6 mmHg and no AI; LV function improved  compared to previous.   2. Left ventricular ejection fraction, by estimation, is 50 to 55%. The  left ventricle has low normal function. The left ventricle has no regional  wall motion abnormalities. There is moderate left ventricular hypertrophy.  Left ventricular diastolic  parameters are indeterminate.   3. Right ventricular systolic function is normal. The right ventricular  size is normal.   4. Left atrial size was moderately dilated.   5. The mitral valve is normal in structure. No evidence of mitral valve  regurgitation. No evidence of mitral stenosis. Moderate mitral annular  calcification.   6. The aortic valve has been repaired/replaced. Aortic valve  regurgitation is not visualized. No aortic stenosis is present. There is a  26 mm Sapien prosthetic (TAVR)  valve present in the aortic position. Echo  findings are consistent with normal  structure and function of the aortic valve prosthesis.   7. The inferior vena cava is dilated in size with <50% respiratory  variability, suggesting right atrial pressure of 15 mmHg.   FINDINGS   Left Ventricle: Left ventricular ejection fraction, by estimation, is 50  to  55%. The left ventricle has low normal function. The left ventricle has  no regional wall motion abnormalities. The left ventricular internal  cavity size was normal in size.  There is moderate left ventricular hypertrophy. Left ventricular diastolic  parameters are indeterminate.   Right Ventricle: The right ventricular size is normal. Right ventricular  systolic function is normal.   Left Atrium: Left atrial size was moderately dilated.   Right Atrium: Right atrial size was normal in size.   Pericardium: There is no evidence of pericardial effusion.   Mitral Valve: The mitral valve is normal in structure. Moderate mitral  annular calcification. No evidence of mitral valve regurgitation. No  evidence of mitral valve stenosis. MV peak gradient, 3.8 mmHg. The mean  mitral valve gradient is 2.0 mmHg.   Tricuspid Valve: The tricuspid valve is normal in structure. Tricuspid  valve regurgitation is trivial. No evidence of tricuspid stenosis.   Aortic Valve: The aortic valve has been repaired/replaced. Aortic valve  regurgitation is not visualized. No aortic stenosis is present. Aortic  valve mean gradient measures 6.0 mmHg. Aortic valve peak gradient measures  10.1 mmHg. Aortic valve area, by VTI   measures 0.88 cm. There is a 26 mm Sapien prosthetic, stented (TAVR)  valve present in the aortic position. Echo findings are consistent with  normal structure and function of the aortic valve prosthesis.   Pulmonic Valve: The pulmonic valve was not well visualized. Pulmonic valve  regurgitation is not visualized. No evidence of pulmonic stenosis.   Aorta: The aortic root is normal in size and structure.   Venous: The inferior vena cava is dilated in size with less than 50%  respiratory variability, suggesting right atrial pressure of 15 mmHg.   IAS/Shunts: The interatrial septum was not well visualized.   Additional Comments: S/P TAVR with mean gradient 6 mmHg and no AI; LV  function  improved compared to previous.    Laboratory Data: High Sensitivity Troponin:   Recent Labs  Lab 10/21/23 0750  TROPONINIHS 600*     Chemistry Recent Labs  Lab 10/21/23 0750  NA 141  K 2.5*  CL 105  CO2 21*  GLUCOSE 184*  BUN 15  CREATININE 2.21*  CALCIUM  7.2*  GFRNONAA 22*  ANIONGAP 15    Recent Labs  Lab 10/21/23 0750  PROT 5.6*  ALBUMIN  2.7*  AST 26  ALT 13  ALKPHOS 66  BILITOT 0.5   Lipids No results for input(s): CHOL, TRIG, HDL, LABVLDL, LDLCALC, CHOLHDL in the last 168 hours.  Hematology Recent Labs  Lab 10/21/23 0750  WBC 5.7  RBC 3.23*  HGB 9.3*  HCT 29.0*  MCV 89.8  MCH 28.8  MCHC 32.1  RDW 17.7*  PLT 195   Thyroid  No results for input(s): TSH, FREET4 in the last 168 hours.  BNPNo results for input(s): BNP, PROBNP in the last 168 hours.  DDimer No results for input(s): DDIMER in the last 168 hours.  Radiology/Studies:  DG Chest 2 View Result Date: 10/21/2023 CLINICAL DATA:  Chest pain EXAM: CHEST - 2 VIEW COMPARISON:  07/30/2023 FINDINGS: Low volume film. Cardiopericardial silhouette is at upper  limits of normal for size. Status post TAVR. Right IJ central line tip overlies the distal SVC level. There is pulmonary vascular congestion without overt pulmonary edema. Telemetry leads overlie the chest. IMPRESSION: Low volume film with pulmonary vascular congestion. Electronically Signed   By: Camellia Candle M.D.   On: 10/21/2023 08:25     Assessment and Plan:  ABENA ERDMAN is a 78 y.o. female with a hx of diffuse CAD, chronic HFmrEF, low-flow low gradient status post TAVR, hypertension, hyperlipidemia, diabetes, ESRD on HD, trifascicular block status post leadless PM who is being seen 10/21/2023 for the evaluation of chest pain/NSTEMI at the request of Dr. Tobie.  NSTEMI Multivessel CAD -- Cardiac cath 06/2023 with 80% mid LAD stenosis, distal LAD stenosis of 80 and 99%, proximal/mid circumflex stenosis of 80% followed by  70%, 100% first diagonal lesion.  These were felt to be relatively distal and treated medically at the time -- Presents back with centralized chest pain and that started around 2 to 3 AM this morning with radiation into her left arm.  -- High-sensitivity troponin 600>>>1746, EKG with right bundle branch block, a pacing -- Given sublingual nitroglycerin  via EMS and route with improvement in her symptoms, but continues to have left arm pain -- Discussed with patient regarding concerns for progression of CAD with recommendations for cardiac catheterization.  She is agreeable to proceed -- Continue IV heparin , aspirin , Zetia   Informed Consent   Shared Decision Making/Informed Consent{  The risks [stroke (1 in 1000), death (1 in 1000), kidney failure [usually temporary] (1 in 500), bleeding (1 in 200), allergic reaction [possibly serious] (1 in 200)], benefits (diagnostic support and management of coronary artery disease) and alternatives of a cardiac catheterization were discussed in detail with Rhonda Clayton and she is willing to proceed.     LFLG AS s/p TAVR -- Underwent TAVR via transfemoral approach 07/27/2023 -- no significant murmur on exam, but did not follow up for her repeat outpatient echo after TAVR. Repeat echo   ESRD on HD -- on MWF schedule, had HD yesterday without issues. Does not appear volume overloaded on exam  Sinus pauses s/p leadless PPM -- follow by EP, A pacing on telemetry  Anemia of chronic disease -- 2/2 renal disease, Hgb stable at 9.3  Hypokalemia -- K+ 2.5, supplement   Risk Assessment/Risk Scores:    TIMI Risk Score for Unstable Angina or Non-ST Elevation MI:   The patient's TIMI risk score is  , which indicates a  % risk of all cause mortality, new or recurrent myocardial infarction or need for urgent revascularization in the next 14 days.    For questions or updates, please contact [facility] Please consult [email or web address] for contact info under    Signed, Rhonda Rummer, NP  10/21/2023 11:10 AM

## 2023-10-21 NOTE — ED Triage Notes (Signed)
 Pt here from home with c/o chest pain that started this morning and some sob , 324mg  asa and 1 nitroglycerin  , pt also thinks she may have had a panic attack this morning

## 2023-10-21 NOTE — ED Provider Notes (Signed)
 Emergency Department Provider Note   I have reviewed the triage vital signs and the nursing notes.   HISTORY  Chief Complaint Chest Pain   HPI Rhonda Clayton is a 78 y.o. female with past history reviewed below including diabetes, hypertension, hyperlipidemia, status post TAVR presents to the emergency department with central chest tightness radiating to the left arm.  Patient states she has had this in the past related to reflux and has no prior history of ACS.  She is compliant with her home medications and regularly attends dialysis.  Her last dialysis session was yesterday.  She is not feeling particularly short of breath.  No sharp, pleuritic pain.  No fevers or chills.  She ultimately arrives by EMS who administered 324 mg of aspirin  and 1 nitroglycerin .  She is wondering if she may have had a panic attack or eaten something that has caused her symptoms. No active pain.   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 Verlin and Dr. Lucas.   Stress incontinence    Torn rotator cuff    Bilateral   Type 2 diabetes mellitus (HCC)    Venous insufficiency    Vitreous hemorrhage, left (HCC) 05/25/2019   The nature of the vitreous hemorrhage was discussed with the patient as well as the common causes.   Patients with diabetic eye disease may develop retinal neovascularization.  Other eye conditions develop retinal  neovascularization secondary to retinal venous occlusions.   Vitreous hemorrhage may  result from spontaneous vitreous detachment or retinal breaks.  Blunt trauma is a common cause as wl    Review of Systems  Constitutional: No fever/chills Cardiovascular: Positive chest pain. Respiratory: Denies shortness of breath. Gastrointestinal: No abdominal pain.  No nausea, no vomiting.   Musculoskeletal: Negative for back pain. Skin: Negative for rash. Neurological: Negative for headaches.  ____________________________________________   PHYSICAL EXAM:  VITAL SIGNS: ED Triage Vitals  Encounter Vitals Group     BP 10/21/23 0730 (!) 166/76     Pulse Rate 10/21/23 0730 76     Resp 10/21/23 0734 18     Temp 10/21/23 0734 97.9 F (36.6 C)     Temp Source 10/21/23 0734 Oral     SpO2 10/21/23 0730 100 %     Weight 10/21/23 0728 173 lb 8 oz (78.7 kg)     Height 10/21/23 0728 5' 1 (1.549 m)   Constitutional: Alert and oriented. Well appearing and in no acute distress. Eyes: Conjunctivae are normal.  Head: Atraumatic. Nose: No congestion/rhinnorhea. Mouth/Throat: Mucous membranes are moist.   Neck: No stridor.   Cardiovascular: Normal rate, regular rhythm. Good peripheral circulation. Grossly normal heart sounds.   Respiratory: Normal respiratory effort.  No retractions. Lungs CTAB. Gastrointestinal: No distention.  Musculoskeletal: No lower extremity tenderness nor edema. No gross deformities of extremities. Neurologic:  Normal speech and language.  Skin:  Skin is  warm, dry and intact. No rash noted.   ____________________________________________   LABS (all labs ordered are listed, but only abnormal results are displayed)  Labs Reviewed - No data to display ____________________________________________  EKG   EKG Interpretation Date/Time:  Thursday October 21 2023 07:36:00 EDT Ventricular Rate:  72 PR Interval:  196 QRS Duration:  151 QT Interval:  502 QTC Calculation: 550 R Axis:   -67  Text Interpretation: A paced rhythm Right bundle branch block LVH  with IVCD and secondary repol abnrm Confirmed by Darra Chew [phone]) on 10/21/2023 7:43:35 AM        ____________________________________________  RADIOLOGY  No results found.  ____________________________________________   PROCEDURES  Procedure(s) performed:   Procedures   ____________________________________________   INITIAL IMPRESSION / ASSESSMENT AND PLAN / ED COURSE  Pertinent labs & imaging results that were available during my care of the patient were reviewed by me and considered in my medical decision making (see chart for details).   This patient is Presenting for Evaluation of CP, which does require a range of treatment options, and is a complaint that involves a high risk of morbidity and mortality.  The Differential Diagnoses includes but is not exclusive to acute coronary syndrome, aortic dissection, pulmonary embolism, cardiac tamponade, community-acquired pneumonia, pericarditis, musculoskeletal chest wall pain, etc.   Critical Interventions-    Medications - No data to display  Reassessment after intervention:     I did obain Additional Historical Information from EMS.   I decided to review pertinent External Data, and in summary no records in our system of a left heart cath.   Clinical Laboratory Tests Ordered, included ***  Radiologic Tests Ordered, included CXR. I independently interpreted the images and agree with radiology interpretation.   Cardiac Monitor Tracing which shows paced rhythm.    Social Determinants of Health Risk patient is a non-smoker.   Consult complete with [facility]. They will consult but with ESRD and hypokalemia they request medicine admit.   Medical Decision Making: Summary:  Patient presents emergency department valuation of chest discomfort.  Patient with multiple risk factors for ACS but also for noncardiac chest discomfort.  Plan for ACS evaluation and reassess.  Reevaluation with update and discussion with    ***Considered admission***  Patient's presentation is most consistent with acute presentation with potential threat to life or bodily function.   Disposition:   ____________________________________________  FINAL CLINICAL IMPRESSION(S) / ED DIAGNOSES  Final diagnoses:  None     NEW OUTPATIENT MEDICATIONS STARTED DURING THIS VISIT:  New Prescriptions   No medications on file    Note:  This document was prepared using Dragon voice recognition software and may include unintentional dictation errors.  Chew Darra, MD, [facility] Emergency Medicine

## 2023-10-22 ENCOUNTER — Encounter (HOSPITAL_COMMUNITY): Payer: Self-pay | Admitting: Internal Medicine

## 2023-10-22 DIAGNOSIS — I214 Non-ST elevation (NSTEMI) myocardial infarction: Secondary | ICD-10-CM | POA: Diagnosis not present

## 2023-10-22 LAB — CBC
HCT: 32.9 % — ABNORMAL LOW (ref 36.0–46.0)
Hemoglobin: 10.6 g/dL — ABNORMAL LOW (ref 12.0–15.0)
MCH: 28.6 pg (ref 26.0–34.0)
MCHC: 32.2 g/dL (ref 30.0–36.0)
MCV: 88.7 fL (ref 80.0–100.0)
Platelets: 229 K/uL (ref 150–400)
RBC: 3.71 MIL/uL — ABNORMAL LOW (ref 3.87–5.11)
RDW: 18.2 % — ABNORMAL HIGH (ref 11.5–15.5)
WBC: 5.1 K/uL (ref 4.0–10.5)
nRBC: 0 % (ref 0.0–0.2)

## 2023-10-22 LAB — COMPREHENSIVE METABOLIC PANEL WITH GFR
ALT: 13 U/L (ref 0–44)
AST: 26 U/L (ref 15–41)
Albumin: 2.7 g/dL — ABNORMAL LOW (ref 3.5–5.0)
Alkaline Phosphatase: 66 U/L (ref 38–126)
Anion gap: 15 (ref 5–15)
BUN: 15 mg/dL (ref 8–23)
CO2: 21 mmol/L — ABNORMAL LOW (ref 22–32)
Calcium: 7.2 mg/dL — ABNORMAL LOW (ref 8.9–10.3)
Chloride: 105 mmol/L (ref 98–111)
Creatinine, Ser: 2.21 mg/dL — ABNORMAL HIGH (ref 0.44–1.00)
GFR, Estimated: 22 mL/min — ABNORMAL LOW (ref >60)
Glucose, Bld: 184 mg/dL — ABNORMAL HIGH (ref 70–99)
Potassium: 2.5 mmol/L — CL (ref 3.5–5.1)
Sodium: 141 mmol/L (ref 135–145)
Total Bilirubin: 0.5 mg/dL (ref 0.0–1.2)
Total Protein: 5.6 g/dL — ABNORMAL LOW (ref 6.5–8.1)

## 2023-10-22 LAB — GLUCOSE, CAPILLARY
Glucose-Capillary: 141 mg/dL — ABNORMAL HIGH (ref 70–99)
Glucose-Capillary: 330 mg/dL — ABNORMAL HIGH (ref 70–99)
Glucose-Capillary: 128 mg/dL — ABNORMAL HIGH (ref 70–99)
Glucose-Capillary: 286 mg/dL — ABNORMAL HIGH (ref 70–99)

## 2023-10-22 LAB — LIPID PANEL
Cholesterol: 167 mg/dL (ref 0–200)
HDL: 34 mg/dL — ABNORMAL LOW (ref >40)
LDL Cholesterol: 97 mg/dL (ref 0–99)
Total CHOL/HDL Ratio: 4.9 ratio
Triglycerides: 180 mg/dL — ABNORMAL HIGH (ref <150)
VLDL: 36 mg/dL (ref 0–40)

## 2023-10-22 LAB — HEPATITIS B SURFACE ANTIBODY, QUANTITATIVE: Hep B S AB Quant (Post): <3.5 m[IU]/mL — ABNORMAL LOW

## 2023-10-22 LAB — BASIC METABOLIC PANEL WITH GFR
Anion gap: 10 (ref 5–15)
BUN: 17 mg/dL (ref 8–23)
CO2: 25 mmol/L (ref 22–32)
Calcium: 8.7 mg/dL — ABNORMAL LOW (ref 8.9–10.3)
Chloride: 100 mmol/L (ref 98–111)
Creatinine, Ser: 3.17 mg/dL — ABNORMAL HIGH (ref 0.44–1.00)
GFR, Estimated: 14 mL/min — ABNORMAL LOW (ref >60)
Glucose, Bld: 222 mg/dL — ABNORMAL HIGH (ref 70–99)
Potassium: 3.9 mmol/L (ref 3.5–5.1)
Sodium: 135 mmol/L (ref 135–145)

## 2023-10-22 MED ORDER — MIDODRINE HCL 5 MG PO TABS
5.0000 mg | ORAL_TABLET | Freq: Three times a day (TID) | ORAL | Status: DC
  Administered 2023-10-22 – 2023-10-23 (×2): 5 mg via ORAL
  Filled 2023-10-22 (×2): qty 1

## 2023-10-22 MED ORDER — NITROGLYCERIN 0.4 MG SL SUBL
SUBLINGUAL_TABLET | SUBLINGUAL | Status: AC
  Filled 2023-10-22: qty 1

## 2023-10-22 MED ORDER — MIDODRINE HCL 5 MG PO TABS: 5.0000 mg | ORAL_TABLET | Freq: Three times a day (TID) | ORAL | Status: DC

## 2023-10-22 MED ORDER — HEPARIN SODIUM (PORCINE) 1000 UNIT/ML DIALYSIS: 2500.0000 [IU] | Freq: Once | INTRAMUSCULAR | Status: DC

## 2023-10-22 MED ORDER — PENTAFLUOROPROP-TETRAFLUOROETH EX AERO: 1.0000 | INHALATION_SPRAY | CUTANEOUS | Status: DC | PRN

## 2023-10-22 MED ORDER — LIDOCAINE HCL (PF) 1 % IJ SOLN: 5.0000 mL | INTRAMUSCULAR | Status: DC | PRN

## 2023-10-22 MED ORDER — ALTEPLASE 2 MG IJ SOLR
INTRAMUSCULAR | Status: AC
  Filled 2023-10-22: qty 4

## 2023-10-22 MED ORDER — HEPARIN SODIUM (PORCINE) 1000 UNIT/ML DIALYSIS: 1500.0000 [IU] | INTRAMUSCULAR | Status: DC | PRN

## 2023-10-22 MED ORDER — LIDOCAINE-PRILOCAINE 2.5-2.5 % EX CREA: 1.0000 | TOPICAL_CREAM | CUTANEOUS | Status: DC | PRN

## 2023-10-22 MED ORDER — PRAVASTATIN SODIUM 10 MG PO TABS
20.0000 mg | ORAL_TABLET | Freq: Every day | ORAL | Status: DC
  Administered 2023-10-22: 20 mg via ORAL
  Filled 2023-10-22: qty 2

## 2023-10-22 MED ORDER — NEPRO/CARBSTEADY PO LIQD: 237.0000 mL | ORAL | Status: DC | PRN

## 2023-10-22 MED ORDER — ALTEPLASE 2 MG IJ SOLR
2.0000 mg | Freq: Once | INTRAMUSCULAR | Status: AC | PRN
  Administered 2023-10-22: 2 mg

## 2023-10-22 MED ORDER — HEPARIN SODIUM (PORCINE) 1000 UNIT/ML IJ SOLN
INTRAMUSCULAR | Status: AC
  Filled 2023-10-22: qty 6

## 2023-10-22 MED ORDER — HEPARIN SODIUM (PORCINE) 1000 UNIT/ML DIALYSIS: 1000.0000 [IU] | INTRAMUSCULAR | Status: DC | PRN

## 2023-10-22 MED ORDER — ALTEPLASE 2 MG IJ SOLR
2.0000 mg | Freq: Once | INTRAMUSCULAR | Status: AC
  Administered 2023-10-22: 2 mg

## 2023-10-22 MED ORDER — ANTICOAGULANT SODIUM CITRATE 4% (200MG/5ML) IV SOLN: 5.0000 mL | Status: DC | PRN

## 2023-10-22 NOTE — Progress Notes (Signed)
  Received patient in bed to unit.   Informed consent signed and in chart.    TX duration:1.5     Transported by  Hand-off given to patient's nurse.    Access used: CVC Access issues: Not running properly. Altepase dwelled in CVC   Total UF removed: 400 Medication(s) given: Nitrostat  1 time with relief Post HD VS: 127/54      Olivia Hurst LPN [facility]

## 2023-10-22 NOTE — TOC Initial Note (Signed)
 Transition of Care [facility]) - Initial/Assessment Note    Patient Details  Name: Rhonda Clayton MRN: [id number] Date of Birth: Jul 04, 1945  Transition of Care [facility] CM/SW Contact:    Waddell Barnie Rama, RN Phone Number: 10/22/2023, 10:50 AM  Clinical Narrative:                 From home alone, has PCP and insurance on file, states has [facility] in place  with [facility] for HHPT and would like to continue with them, this NCM confirmed with Darleene with [facility], she has a walker and a cane at home.  States family member (son) will transport them home at [employer] and family is support system, states gets medications from [facility].  Pta self ambulatory with walker/cane.   Will need HHPT orders to resume prior to dc.     Expected Discharge Plan: Home w Home Health Services Barriers to Discharge: Continued Medical Work up   Patient Goals and CMS Choice Patient states their goals for this hospitalization and ongoing recovery are:: return home CMS Medicare.gov Compare Post Acute Care list provided to:: Patient Choice offered to / list presented to : Patient      Expected Discharge Plan and Services In-house Referral: NA Discharge Planning Services: CM Consult Post Acute Care Choice: Resumption of Svcs/PTA Provider, Home Health Living arrangements for the past 2 months: Single Family Home                 DME Arranged: N/A DME Agency: NA       HH Arranged: PT HH Agency: [facility] Date [facility] Agency Contacted: 10/22/23 Time HH Agency Contacted: 1049 Representative spoke with at [facility] Agency: Darleene  Prior Living Arrangements/Services Living arrangements for the past 2 months: Single Family Home Lives with:: Self Patient language and need for interpreter reviewed:: Yes Do you feel safe going back to the place where you live?: Yes      Need for Family Participation in Patient Care: Yes (Comment) Care giver support system in place?: Yes (comment) Current home services: DME,  Home PT (walker , cane, active with [facility] for HHPT) Criminal Activity/Legal Involvement Pertinent to Current Situation/Hospitalization: No - Comment as needed  Activities of Daily Living      Permission Sought/Granted Permission sought to share information with : Case Manager Permission granted to share information with : Yes, Verbal Permission Granted     Permission granted to share info w AGENCY: HH        Emotional Assessment Appearance:: Appears stated age Attitude/Demeanor/Rapport: Engaged Affect (typically observed): Appropriate Orientation: : Oriented to Self, Oriented to Place, Oriented to  Time, Oriented to Situation Alcohol  / Substance Use: Not Applicable Psych Involvement: No (comment)  Admission diagnosis:  Hypokalemia [E87.6] NSTEMI (non-ST elevated myocardial infarction) (HCC) [I21.4] Patient Active Problem List   Diagnosis Date Noted   Hypokalemia 10/21/2023   Arterial hypotension 10/21/2023   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   Pressure injury of skin 07/28/2023   Sick sinus syndrome (HCC) 07/23/2023   Symptomatic sinus bradycardia 07/22/2023   ESRD (end stage renal disease) (HCC) 07/22/2023   Malnutrition of moderate degree 06/19/2023   SOB (shortness of breath) 06/17/2023   Acute on chronic systolic CHF (congestive heart failure), NYHA class 3 (HCC) 06/15/2023   Severe aortic stenosis 06/15/2023   NSTEMI (non-ST elevated myocardial infarction) (HCC) 06/13/2023   Acute congestive heart failure (HCC) 06/12/2023   Hyperlipidemia    Acute respiratory failure with  hypoxia (HCC) 01/05/2023   Multifocal pneumonia 01/03/2023   Prolonged QT interval 01/03/2023   CKD (chronic kidney disease), stage IV (HCC) 01/03/2023   Anemia 01/03/2023   CHF with left ventricular diastolic dysfunction, NYHA class 1 (HCC) 01/03/2023   Macular pucker, 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 determined by  examination associated with type 2 diabetes mellitus (HCC) 05/25/2019   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) 05/25/2019   Protein-calorie malnutrition 05/18/2014   Slow transit constipation 05/18/2014   Thyroid  activity decreased 05/18/2014   Essential hypertension 05/18/2014   Other secondary scoliosis, thoracolumbar region 05/09/2014   DM (diabetes mellitus) (HCC) 03/01/2011   HTN (hypertension) 03/01/2011   Hypothyroid 03/01/2011   PCP:  [name], MD Pharmacy:   [facility] [place], [place] - [address] [place] [address] Phone: [phone] Fax: [phone]  [facility] - [facility] [address] [place] [address] Phone: [phone] Fax: [phone]  [facility] [address] [place] [address] Phone: [phone] Fax: [phone]     Social Drivers of Health (SDOH) Social History: SDOH Screenings   Food Insecurity: No Food Insecurity (07/23/2023)  Housing: Low Risk  (07/23/2023)  Transportation Needs: No Transportation Needs (07/23/2023)  Utilities: Not At Risk (07/23/2023)  Depression (PHQ2-9): Low Risk  (06/10/2023)  Social Connections: Patient Declined (07/23/2023)  Tobacco Use: Low Risk  (10/21/2023)   SDOH Interventions:     Readmission Risk Interventions    10/22/2023   10:44 AM 07/26/2023    4:09 PM  Readmission Risk Prevention Plan  Transportation Screening Complete Complete  PCP or Specialist Appt within 3-5 Days Complete   HRI or Home Care Consult Complete Complete  Social Work Consult for Recovery Care Planning/Counseling  Complete  Palliative Care Screening Not Applicable Not Applicable  Medication Review Oceanographer) Complete Referral to Pharmacy

## 2023-10-22 NOTE — Progress Notes (Signed)
 PROGRESS NOTE    Rhonda Clayton  FMW:[id number] DOB: 05-Sep-1945 DOA: 10/21/2023 PCP: [name], MD  Outpatient Specialists:     Brief Narrative:  Patient is an 78 year old female with past medical history significant for end-stage renal disease on hemodialysis Monday, Wednesday and Friday, aortic stenosis, s/p TAVR, hypertension, diabetes mellitus and coronary artery disease.  Patient was admitted with chest pain.  Patient underwent cardiac catheterization (see below).  Cardiology team is directing management of chest pain.  10/22/2023: Patient seen.  Patient is currently chest pain-free.  Awaiting cardiology input.  For hemodialysis today.   Assessment & Plan:   Principal Problem:   NSTEMI (non-ST elevated myocardial infarction) (HCC) Active Problems:   ESRD (end stage renal disease) (HCC)   Hypokalemia   Arterial hypotension   NSTEMI: - Cardiology is directing care. - Medical management. - See cardiac cath report as documented below. -Discharge patient back home when cleared for discharge by the cardiology team.  Chronic diastolic CHF: - Echo done on 07/28/2023 revealed EF of 50 to 55%. - Compensated.  Aortic stenosis: - S/p TAVR. - Stable.  End-stage renal disease: - On hemodialysis Monday, Wednesday and Friday. - For hemodialysis today.  Hypertension: - Optimize. - Last blood pressure of 127/54 mmHg.  Anemia: - Likely multifactorial. - History of end-stage renal disease. - Hemoglobin of 10.6 g/dL today, with MCV of 11.2. - Hemoglobin is at goal.  Hypothyroidism: -Continue Synthroid  100 mcg p.o. once daily.  Diabetes mellitus type 2: - Continue to optimize. - Low threshold to start sliding scale insulin  coverage.   DVT prophylaxis:  Code Status: Full code. Family Communication:  Disposition Plan: Inpatient.   Consultants:  Cardiology. Nephrology.  Procedures:  Cardiac catheterization: Mid LAD lesion is 80% stenosed.   Dist LAD-1  lesion is 99% stenosed.   Dist LAD-2 lesion is 80% stenosed.   Prox Cx to Mid Cx lesion is 80% stenosed.   Mid Cx to Dist Cx lesion is 90% stenosed.   1st Diag lesion is 100% stenosed.   Prox RCA lesion is 30% stenosed.   1.  Relatively unchanged disease when compared to her previous study with diffuse disease of the mid to proximal LAD with high-grade serial stenoses.  The caliber of the LAD is very small.  Extensive stenting would need to be required with questionable durability given small vessel size. 2.  Progression of distal left circumflex lesion; again this is a small artery and should be treated medically. 3.  Mild RCA disease. 4.  LVEDP of 26 mmHg.   Summary: Given very small caliber vessels with diffuse disease and the need for extensive stenting particularly of the LAD which would be associated with uncertain durability, medical therapy should be pursued.  Will start DAPT with Plavix for medical treatment of acute coronary syndrome.  Discontinue heparin  drip.  4 hours bedrest given Mynx closure.    Antimicrobials:  None   Subjective: Chest pain has resolved.  Objective: Vitals:   10/21/23 2015 10/21/23 2317 10/22/23 0513 10/22/23 0821  BP:  (!) 124/57 137/70 (!) 145/67  Pulse: 67 67 69 80  Resp:  18 19 18   Temp: 98.4 F (36.9 C) 98.5 F (36.9 C) (!) 97.5 F (36.4 C) 97.6 F (36.4 C)  TempSrc:  Oral Oral Oral  SpO2: 98% 98% 98% 96%  Weight:      Height:        Intake/Output Summary (Last 24 hours) at 10/22/2023 1006 Last data filed at 10/21/2023 2244 Gross  per 24 hour  Intake 240 ml  Output --  Net 240 ml   Filed Weights   10/21/23 0728  Weight: 78.7 kg    Examination:  General exam: Appears calm and comfortable  Respiratory system: Clear to auscultation. Respiratory effort normal. Cardiovascular system: S1 & S2 heard Gastrointestinal system: Abdomen is nondistended, soft and nontender.  Central nervous system: Alert and oriented.  Extremities: No  leg edema.  Data Reviewed: I have personally reviewed following labs and imaging studies  CBC: Recent Labs  Lab 10/21/23 0750 10/21/23 1420 10/22/23 0252  WBC 5.7  --  5.1  NEUTROABS 3.6  --   --   HGB 9.3* 10.5* 10.6*  HCT 29.0* 31.0* 32.9*  MCV 89.8  --  88.7  PLT 195  --  229   Basic Metabolic Panel: Recent Labs  Lab 10/21/23 0750 10/21/23 1356 10/21/23 1420  NA 141 139 139  K 2.5* 3.5 3.7  CL 105 99 102  CO2 21* 26  --   GLUCOSE 184* 125* 119*  BUN 15 17 18   CREATININE 2.21* 2.71* 3.00*  CALCIUM  7.2* 8.6*  --    GFR: Estimated Creatinine Clearance: 14.2 mL/min (A) (by C-G formula based on SCr of 3 mg/dL (H)). Liver Function Tests: Recent Labs  Lab 10/21/23 0750  AST 26  ALT 13  ALKPHOS 66  BILITOT 0.5  PROT 5.6*  ALBUMIN  2.7*   Recent Labs  Lab 10/21/23 0750  LIPASE 18   No results for input(s): AMMONIA in the last 168 hours. Coagulation Profile: No results for input(s): INR, PROTIME in the last 168 hours. Cardiac Enzymes: No results for input(s): CKTOTAL, CKMB, CKMBINDEX, TROPONINI in the last 168 hours. BNP (last 3 results) No results for input(s): PROBNP in the last 8760 hours. HbA1C: Recent Labs    10/21/23 0750  HGBA1C 6.6*   CBG: Recent Labs  Lab 10/21/23 1142 10/21/23 1355 10/21/23 2132 10/22/23 0616  GLUCAP 167* 122* 265* 141*   Lipid Profile: Recent Labs    10/22/23 0252  CHOL 167  HDL 34*  LDLCALC 97  TRIG 819*  CHOLHDL 4.9   Thyroid  Function Tests: No results for input(s): TSH, T4TOTAL, FREET4, T3FREE, THYROIDAB in the last 72 hours. Anemia Panel: No results for input(s): VITAMINB12, FOLATE, FERRITIN, TIBC, IRON , RETICCTPCT in the last 72 hours. Urine analysis:    Component Value Date/Time   COLORURINE STRAW (A) 07/22/2023 1802   APPEARANCEUR CLEAR 07/22/2023 1802   LABSPEC 1.008 07/22/2023 1802   PHURINE 8.0 07/22/2023 1802   GLUCOSEU 150 (A) 07/22/2023 1802   HGBUR  NEGATIVE 07/22/2023 1802   BILIRUBINUR negative 09/25/2023 1209   BILIRUBINUR neg 03/01/2011 1243   KETONESUR negative 09/25/2023 1209   KETONESUR NEGATIVE 07/22/2023 1802   PROTEINUR =100 (A) 09/25/2023 1209   PROTEINUR 100 (A) 07/22/2023 1802   UROBILINOGEN 0.2 09/25/2023 1209   UROBILINOGEN 0.2 03/31/2007 0750   NITRITE Negative 09/25/2023 1209   NITRITE NEGATIVE 07/22/2023 1802   LEUKOCYTESUR Moderate (2+) (A) 09/25/2023 1209   LEUKOCYTESUR NEGATIVE 07/22/2023 1802   Sepsis Labs: @LABRCNTIP (procalcitonin:4,lacticidven:4)  )No results found for this or any previous visit (from the past 240 hours).       Radiology Studies: CARDIAC CATHETERIZATION Addendum Date: 10/21/2023   Mid LAD lesion is 80% stenosed.   Dist LAD-1 lesion is 99% stenosed.   Dist LAD-2 lesion is 80% stenosed.   Prox Cx to Mid Cx lesion is 80% stenosed.   Mid Cx to Dist Cx lesion  is 90% stenosed.   1st Diag lesion is 100% stenosed.   Prox RCA lesion is 30% stenosed. 1.  Relatively unchanged disease when compared to her previous study with diffuse disease of the mid to proximal LAD with high-grade serial stenoses.  The caliber of the LAD is very small.  Extensive stenting would need to be required with questionable durability given small vessel size. 2.  Progression of distal left circumflex lesion; again this is a small artery and should be treated medically. 3.  Mild RCA disease. 4.  LVEDP of 26 mmHg. Summary: Given very small caliber vessels with diffuse disease and the need for extensive stenting particularly of the LAD which would be associated with uncertain durability, medical therapy should be pursued.  Will start DAPT with Plavix for medical treatment of acute coronary syndrome.  Discontinue heparin  drip.  4 hours bedrest given Mynx closure.   Result Date: 10/21/2023   Mid LAD lesion is 80% stenosed.   Dist LAD-1 lesion is 99% stenosed.   Dist LAD-2 lesion is 80% stenosed.   Prox Cx to Mid Cx lesion is 80%  stenosed.   Mid Cx to Dist Cx lesion is 90% stenosed.   1st Diag lesion is 100% stenosed.   Prox RCA lesion is 30% stenosed. 1.  Relatively unchanged disease when compared to her previous study with diffuse disease of the mid to proximal LAD with high-grade serial stenoses.  The caliber of the LAD is very small.  Extensive stenting would need to be required with questionable durability given small vessel size. 2.  Progression of distal left circumflex lesion; again this is a small artery and should be treated medically. 3.  Mild RCA disease. 4.  LVEDP of 26 mmHg. Summary: Given very small caliber vessels with diffuse disease and the need for extensive stenting particularly of the LAD which would be associated with uncertain durability, medical therapy should be pursued.  ECHOCARDIOGRAM COMPLETE Result Date: 10/21/2023    ECHOCARDIOGRAM REPORT   Patient Name:   NILAH BELCOURT Date of Exam: 10/21/2023 Medical Rec #:  [id number]       Height:       61.0 in Accession #:    [id number]      Weight:       173.5 lb Date of Birth:  Oct 15, 1945       BSA:          1.778 m Patient Age:    78 years        BP:           127/57 mmHg Patient Gender: F               HR:           48 bpm. Exam Location:  Inpatient Procedure: 2D Echo, Cardiac Doppler and Color Doppler (Both Spectral and Color            Flow Doppler were utilized during procedure). Indications:    NSTEMI  History:        Patient has prior history of Echocardiogram examinations, most                 recent 07/28/2023. Pacemaker, Signs/Symptoms:Shortness of Breath;                 Risk Factors:Hypertension, Dyslipidemia, Diabetes and Sleep                 Apnea. CKD.  Aortic Valve: 26 mm Sapien prosthetic, stented (TAVR) valve is                 present in the aortic position.  Sonographer:    Philomena Daring Referring Phys: MANUELITA KATHEE RUMMER IMPRESSIONS  1. Left ventricular ejection fraction, by estimation, is 45 to 50%. Left ventricular ejection fraction  by PLAX is 53 %. The left ventricle has mildly decreased function. The left ventricle demonstrates regional wall motion abnormalities (see scoring diagram/findings for description). There is mild left ventricular hypertrophy. Left ventricular diastolic parameters are consistent with Grade I diastolic dysfunction (impaired relaxation). There is moderate hypokinesis of the left ventricular, apical apical segment and lateral wall.  2. Right ventricular systolic function is low normal. The right ventricular size is normal. There is normal pulmonary artery systolic pressure. The estimated right ventricular systolic pressure is 33.0 mmHg.  3. The mitral valve is degenerative. Mild mitral valve regurgitation. Moderate to severe mitral annular calcification.  4. The aortic valve has been repaired/replaced. Aortic valve regurgitation is not visualized. There is a 26 mm Sapien prosthetic (TAVR) valve present in the aortic position. Aortic valve area, by VTI measures 1.29 cm. Aortic valve mean gradient measures 4.5 mmHg. Aortic valve Vmax measures 1.42 m/s. Peak gradient 8.1 mmHg, DI 0.64.  5. The inferior vena cava is normal in size with greater than 50% respiratory variability, suggesting right atrial pressure of 3 mmHg. Comparison(s): Changes from prior study are noted. 07/28/2023: LVEF 50-55%, Sapien TAVR - MG 6 mmHg. FINDINGS  Left Ventricle: Left ventricular ejection fraction, by estimation, is 45 to 50%. Left ventricular ejection fraction by PLAX is 53 %. The left ventricle has mildly decreased function. The left ventricle demonstrates regional wall motion abnormalities. Moderate hypokinesis of the left ventricular, apical apical segment and lateral wall. The left ventricular internal cavity size was normal in size. There is mild left ventricular hypertrophy. Left ventricular diastolic parameters are consistent with Grade I diastolic dysfunction (impaired relaxation). Indeterminate filling pressures.  LV Wall Scoring:  The apical lateral segment, mid anterolateral segment, apical septal segment, apical anterior segment, and apex are hypokinetic. Right Ventricle: The right ventricular size is normal. No increase in right ventricular wall thickness. Right ventricular systolic function is low normal. There is normal pulmonary artery systolic pressure. The tricuspid regurgitant velocity is 2.74 m/s,  and with an assumed right atrial pressure of 3 mmHg, the estimated right ventricular systolic pressure is 33.0 mmHg. Left Atrium: Left atrial size was normal in size. Right Atrium: Right atrial size was normal in size. Pericardium: There is no evidence of pericardial effusion. Mitral Valve: The mitral valve is degenerative in appearance. Moderate to severe mitral annular calcification. Mild mitral valve regurgitation. Tricuspid Valve: The tricuspid valve is grossly normal. Tricuspid valve regurgitation is trivial. Aortic Valve: The aortic valve has been repaired/replaced. Aortic valve regurgitation is not visualized. Aortic valve mean gradient measures 4.5 mmHg. Aortic valve peak gradient measures 8.1 mmHg. Aortic valve area, by VTI measures 1.29 cm. There is a 26 mm Sapien prosthetic, stented (TAVR) valve present in the aortic position. Echo findings are consistent with normal structure and function of the aortic valve prosthesis. Pulmonic Valve: The pulmonic valve was normal in structure. Pulmonic valve regurgitation is not visualized. Aorta: The aortic root and ascending aorta are structurally normal, with no evidence of dilitation. Venous: The inferior vena cava is normal in size with greater than 50% respiratory variability, suggesting right atrial pressure of 3 mmHg. IAS/Shunts: No atrial level  shunt detected by color flow Doppler. Additional Comments: A venous catheter is visualized.  LEFT VENTRICLE PLAX 2D LV EF:         Left            Diastology                ventricular     LV e' medial:    4.35 cm/s                ejection         LV E/e' medial:  17.2                fraction by     LV e' lateral:   5.77 cm/s                PLAX is 53      LV E/e' lateral: 13.0                %. LVIDd:         4.40 cm LVIDs:         3.20 cm LV PW:         1.20 cm LV IVS:        1.20 cm LVOT diam:     1.60 cm LV SV:         39 LV SV Index:   22 LVOT Area:     2.01 cm  RIGHT VENTRICLE             IVC RV S prime:     10.20 cm/s  IVC diam: 1.20 cm TAPSE (M-mode): 2.3 cm LEFT ATRIUM             Index        RIGHT ATRIUM           Index LA diam:        3.90 cm 2.19 cm/m   RA Area:     15.40 cm LA Vol (A2C):   61.4 ml 34.53 ml/m  RA Volume:   40.30 ml  22.67 ml/m LA Vol (A4C):   56.3 ml 31.66 ml/m LA Biplane Vol: 59.0 ml 33.18 ml/m  AORTIC VALVE AV Area (Vmax):    1.29 cm AV Area (Vmean):   1.31 cm AV Area (VTI):     1.29 cm AV Vmax:           142.50 cm/s AV Vmean:          94.100 cm/s AV VTI:            0.300 m AV Peak Grad:      8.1 mmHg AV Mean Grad:      4.5 mmHg LVOT Vmax:         91.70 cm/s LVOT Vmean:        61.300 cm/s LVOT VTI:          0.193 m LVOT/AV VTI ratio: 0.64  AORTA Ao Root diam: 2.40 cm Ao Asc diam:  3.00 cm MITRAL VALVE                TRICUSPID VALVE MV Area (PHT): 2.87 cm     TR Peak grad:   30.0 mmHg MV Decel Time: 264 msec     TR Vmax:        274.00 cm/s MV E velocity: 74.90 cm/s MV A velocity: 102.00 cm/s  SHUNTS MV E/A ratio:  0.73         Systemic VTI:  0.19  m                             Systemic Diam: 1.60 cm Vinie Maxcy MD Electronically signed by Vinie Maxcy MD Signature Date/Time: 10/21/2023/3:55:31 PM    Final    DG Chest 2 View Result Date: 10/21/2023 CLINICAL DATA:  Chest pain EXAM: CHEST - 2 VIEW COMPARISON:  07/30/2023 FINDINGS: Low volume film. Cardiopericardial silhouette is at upper limits of normal for size. Status post TAVR. Right IJ central line tip overlies the distal SVC level. There is pulmonary vascular congestion without overt pulmonary edema. Telemetry leads overlie the chest. IMPRESSION: Low  volume film with pulmonary vascular congestion. Electronically Signed   By: Camellia Candle M.D.   On: 10/21/2023 08:25        Scheduled Meds:  aspirin  EC  81 mg Oral Daily   Chlorhexidine  Gluconate Cloth  6 each Topical Q0600   clopidogrel  75 mg Oral Daily   ezetimibe   10 mg Oral Daily   insulin  aspart  0-9 Units Subcutaneous TID WC   [START ON 10/23/2023] levothyroxine   100 mcg Oral Once per day on Sunday Tuesday Wednesday Thursday Saturday   levothyroxine   50 mcg Oral Once per day on Monday Friday   pantoprazole  (PROTONIX ) IV  40 mg Intravenous Q12H   sodium chloride  flush  3 mL Intravenous Q12H   Continuous Infusions:  sodium chloride        LOS: 1 day    Time spent: 55 minutes.    Rhonda Chapel, MD  [facility] Pager #: [phone] 7PM-7AM contact night coverage as above

## 2023-10-22 NOTE — Progress Notes (Signed)
 Progress Note  Patient Name: Rhonda Clayton Date of Encounter: 10/22/2023 [facility] Cardiologist: Arun K Thukkani, MD    Interval Summary   Patient feeling well this AM. No chest pain, shortness of breath. No pain near femoral cath site. Scheduled for dialysis later today   Vital Signs Vitals:   10/21/23 2015 10/21/23 2317 10/22/23 0513 10/22/23 0821  BP:  (!) 124/57 137/70 (!) 145/67  Pulse: 67 67 69 80  Resp:  18 19 18   Temp: 98.4 F (36.9 C) 98.5 F (36.9 C) (!) 97.5 F (36.4 C) 97.6 F (36.4 C)  TempSrc:  Oral Oral Oral  SpO2: 98% 98% 98% 96%  Weight:      Height:        Intake/Output Summary (Last 24 hours) at 10/22/2023 1052 Last data filed at 10/21/2023 2244 Gross per 24 hour  Intake 240 ml  Output --  Net 240 ml      10/21/2023    7:28 AM 08/06/2023    2:00 PM 08/05/2023    3:19 PM  Last 3 Weights  Weight (lbs) 173 lb 8 oz 174 lb 8 oz 176 lb 12.8 oz  Weight (kg) 78.7 kg 79.153 kg 80.196 kg      Telemetry/ECG  Atrial paced - Personally Reviewed  Physical Exam  GEN: No acute distress.  Sitting comfortably on the side of the bed  Neck: No JVD Cardiac:  RRR, no murmurs, rubs, or gallops.  Respiratory: Clear to auscultation bilaterally. Normal WOB on room air  GI: Soft, nontender, non-distended  MS: No edema in BLE  Assessment & Plan   NSTEMI  - Patient presented with chest pain that began early that AM. Previous cardiac catheterization in 06/2023 showed 100% D1, 80% mid LAD, 99% distal LAD, 80% proximal left circumflex and 70% mid left circumflex disease. This was medically managed at the time of her TAVR  - hsTn peaked at 15,653  - Underwent cath yesterday that showed relatively unchanged disease when compared to previous cath. Diffuse disease of the prox to mid LAD with high-grade serial stenoses. Caliber of the LAD is very small, extensive stenting would be required with questionable durability. There was progression of distal Lcx disease.  LCX was also a small vessel. Mild RCA disease.  - Overall, given small caliber vessels with diffuse disease, recommended medical management  - Continue DAPT with ASA, plavix - Antianginal therapy limited by hypotension and ESRD  - Currently on Zetia - LDL this admission 97. Goal should be less than 70   HLD  - Lipid panel this admission showed LDL 97, HDL 34, triglycerides 180, total cholesterol 167  - Patient previously had muscle pain on lipitor. Refuses to try again. Not interested in repatha. Would be willing to try pravastatin. Discussed crestor but patient concerned about side effects. Will discuss with MD  - Continue zetia  10 mg daily   HFmrEF  - Echo in 07/2023 showed EF 50-55%. Echo this admission with EF 45-50% with regional wall motion abnormalities, low normal RV systolic function, mild MR - GDMT limited by hypotension and ESRD  - Volume managed by HD   Hypokalemia  - K improved to 3.7   Sinus pauses s/p leadless PPM -  followed by EP, A pacing on telemetry  ESRD  - On dialysis MWF   S/p TAVR  - She underwent TAVR for low-flow low gradient aortic stenosis on 07/2023. Mean gradient 6 mmHg without any significant aortic regurgitation on echo 07/2023  -  Echo this admission showed no aortic regurgitation. Mean gradient 4.5 mmHg   For questions or updates, please contact [facility] Please consult [email or web address] for contact info under   Signed, Rollo FABIENE Louder, PA-C

## 2023-10-22 NOTE — Progress Notes (Signed)
 [facility] Progress Note  Subjective:  Had LHC yesterday, per pt medical Rx  Vitals:   10/21/23 2015 10/21/23 2317 10/22/23 0513 10/22/23 0821  BP:  (!) 124/57 137/70 (!) 145/67  Pulse: 67 67 69 80  Resp:  18 19 18   Temp: 98.4 F (36.9 C) 98.5 F (36.9 C) (!) 97.5 F (36.4 C) 97.6 F (36.4 C)  TempSrc:  Oral Oral Oral  SpO2: 98% 98% 98% 96%  Weight:      Height:        Exam: Gen alert, no distress, on RA No jvd or bruits Chest clear bilat to bases RRR no MRG Abd soft ntnd no mass or ascites +bs Ext no LE edema Neuro is alert, Ox 3 , nf       Home bp meds: Midodrine  5mg  pre hd MWF Lasix  40mg  every day on non hd days    OP HD: [facility] MWF  3.5h  B350  76.9kg  2K bath  TDC  Heparin  4000 Mircera 100 mcg q 2 wks, just given on 10/1     Assessment/ Plan: Chest pain/ NSTEMI: troponins ^^. SP LHC 10/02, severe CAD, medical Rx.  LVEDP 26.  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 No missed HD. HD today.  BP: not on any bp lowering meds at home. BP's wnl here.  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 Just rec'd esa, follow.  H/o TAVR H/o leadless PM     Rob Teoman Giraud MD  [facility] 10/22/2023, 11:20 AM  Recent Labs  Lab 10/21/23 0750 10/21/23 1356 10/21/23 1420 10/22/23 0252  HGB 9.3*  --  10.5* 10.6*  ALBUMIN  2.7*  --   --   --   CALCIUM  7.2* 8.6*  --   --   CREATININE 2.21* 2.71* 3.00*  --   K 2.5* 3.5 3.7  --    No results for input(s): IRON , TIBC, FERRITIN in the last 168 hours. Inpatient medications:  aspirin  EC  81 mg Oral Daily   Chlorhexidine  Gluconate Cloth  6 each Topical Q0600   clopidogrel  75 mg Oral Daily   ezetimibe   10 mg Oral Daily   insulin  aspart  0-9 Units Subcutaneous TID WC   [START ON 10/23/2023] levothyroxine   100 mcg Oral Once per day on Sunday Tuesday Wednesday Thursday Saturday   levothyroxine   50 mcg Oral Once per day on Monday Friday   midodrine   5 mg Oral TID WC   pantoprazole  (PROTONIX )  IV  40 mg Intravenous Q12H   sodium chloride  flush  3 mL Intravenous Q12H    sodium chloride      sodium chloride , acetaminophen , hydrALAZINE , nitroGLYCERIN , ondansetron  (ZOFRAN ) IV, sodium chloride  flush

## 2023-10-22 NOTE — Plan of Care (Signed)
  Problem: Skin Integrity: Goal: Risk for impaired skin integrity will decrease Outcome: Progressing   Problem: Clinical Measurements: Goal: Cardiovascular complication will be avoided Outcome: Progressing

## 2023-10-22 NOTE — Progress Notes (Signed)
 Pt receives out-pt HD at [facility] on [facility] on MWF 12:25 pm chair time. Will assist as needed.   Randine Mungo Dialysis Navigator [phone]

## 2023-10-22 NOTE — Progress Notes (Signed)
 Heart Failure Navigator Progress Note  Assessed for Heart & Vascular TOC clinic readiness.  Patient does not meet criteria due to ESRD on hemodialysis. No HF TOC.   Navigator will sign off at this time.   Randie Bustle, BSN, Scientist, clinical (histocompatibility and immunogenetics) Only

## 2023-10-23 ENCOUNTER — Other Ambulatory Visit (HOSPITAL_COMMUNITY): Payer: Self-pay

## 2023-10-23 DIAGNOSIS — I214 Non-ST elevation (NSTEMI) myocardial infarction: Secondary | ICD-10-CM | POA: Diagnosis not present

## 2023-10-23 LAB — CBC
HCT: 29.9 % — ABNORMAL LOW (ref 36.0–46.0)
Hemoglobin: 9.4 g/dL — ABNORMAL LOW (ref 12.0–15.0)
MCH: 28.7 pg (ref 26.0–34.0)
MCHC: 31.4 g/dL (ref 30.0–36.0)
MCV: 91.2 fL (ref 80.0–100.0)
Platelets: 218 K/uL (ref 150–400)
RBC: 3.28 MIL/uL — ABNORMAL LOW (ref 3.87–5.11)
RDW: 18.4 % — ABNORMAL HIGH (ref 11.5–15.5)
WBC: 6.1 K/uL (ref 4.0–10.5)
nRBC: 0 % (ref 0.0–0.2)

## 2023-10-23 LAB — RENAL FUNCTION PANEL
Albumin: 3 g/dL — ABNORMAL LOW (ref 3.5–5.0)
Anion gap: 12 (ref 5–15)
BUN: 24 mg/dL — ABNORMAL HIGH (ref 8–23)
CO2: 24 mmol/L (ref 22–32)
Calcium: 8.6 mg/dL — ABNORMAL LOW (ref 8.9–10.3)
Chloride: 99 mmol/L (ref 98–111)
Creatinine, Ser: 4.13 mg/dL — ABNORMAL HIGH (ref 0.44–1.00)
GFR, Estimated: 10 mL/min — ABNORMAL LOW (ref >60)
Glucose, Bld: 277 mg/dL — ABNORMAL HIGH (ref 70–99)
Phosphorus: 3.5 mg/dL (ref 2.5–4.6)
Potassium: 3.7 mmol/L (ref 3.5–5.1)
Sodium: 135 mmol/L (ref 135–145)

## 2023-10-23 LAB — GLUCOSE, CAPILLARY
Glucose-Capillary: 186 mg/dL — ABNORMAL HIGH (ref 70–99)
Glucose-Capillary: 184 mg/dL — ABNORMAL HIGH (ref 70–99)

## 2023-10-23 MED ORDER — ALTEPLASE 2 MG IJ SOLR: 2.0000 mg | Freq: Once | INTRAMUSCULAR | Status: DC | PRN

## 2023-10-23 MED ORDER — MIDODRINE HCL 5 MG PO TABS
5.0000 mg | ORAL_TABLET | Freq: Three times a day (TID) | ORAL | 0 refills | Status: AC
  Filled 2023-10-23: qty 90, 30d supply, fill #0

## 2023-10-23 MED ORDER — HEPARIN SODIUM (PORCINE) 1000 UNIT/ML DIALYSIS
1000.0000 [IU] | INTRAMUSCULAR | Status: DC | PRN
  Administered 2023-10-23: 3200 [IU]
  Filled 2023-10-23: qty 1

## 2023-10-23 MED ORDER — PRAVASTATIN SODIUM 20 MG PO TABS
20.0000 mg | ORAL_TABLET | Freq: Every day | ORAL | 0 refills | Status: DC
  Filled 2023-10-23: qty 30, 30d supply, fill #0

## 2023-10-23 MED ORDER — HEPARIN SODIUM (PORCINE) 1000 UNIT/ML IJ SOLN
INTRAMUSCULAR | Status: AC
  Filled 2023-10-23: qty 3

## 2023-10-23 MED ORDER — ASPIRIN 81 MG PO TBEC
81.0000 mg | DELAYED_RELEASE_TABLET | Freq: Every day | ORAL | 12 refills | Status: AC
  Filled 2023-10-23: qty 30, 30d supply, fill #0

## 2023-10-23 MED ORDER — HEPARIN SODIUM (PORCINE) 1000 UNIT/ML IJ SOLN
INTRAMUSCULAR | Status: AC
  Filled 2023-10-23: qty 4

## 2023-10-23 MED ORDER — CLOPIDOGREL BISULFATE 75 MG PO TABS
75.0000 mg | ORAL_TABLET | Freq: Every day | ORAL | 1 refills | Status: AC
  Filled 2023-10-23: qty 30, 30d supply, fill #0

## 2023-10-23 MED ORDER — HEPARIN SODIUM (PORCINE) 1000 UNIT/ML DIALYSIS
2500.0000 [IU] | INTRAMUSCULAR | Status: DC | PRN
  Administered 2023-10-23: 2500 [IU] via INTRAVENOUS_CENTRAL
  Filled 2023-10-23: qty 3

## 2023-10-23 NOTE — Discharge Summary (Signed)
 Physician Discharge Summary  [name] MRN: [id number] DOB/AGE: 1945-08-28 78 y.o.  Admit date: 10/21/2023 Discharge date: 10/23/2023  Admission Diagnoses:  Discharge Diagnoses:  Principal Problem:   NSTEMI (non-ST elevated myocardial infarction) [facility]) Active Problems:   ESRD (end stage renal disease) (HCC)   Hypokalemia   Arterial hypotension   Discharged Condition: stable  Hospital Course: Patient is an 78 year old female with past medical history significant for end-stage renal disease on hemodialysis Monday, Wednesday and Friday; aortic stenosis, s/p TAVR, hypertension, diabetes mellitus and coronary artery disease.  Patient was admitted with chest pain.  Rising troponin was noted (see below).  Patient underwent cardiac catheterization (see below).  Cardiology team assisted with patient's management.  Chest pain has resolved.  Patient has been managed medically (as per cardiology team).  Patient continued hemodialysis during the hospital stay.  Patient has been cleared for discharge by the cardiology team.  Patient will follow-up with primary care provider, nephrology and cardiology team on discharge.    NSTEMI: - Cardiology team directed care.  Patient underwent cardiac catheterization.  - Medical management.   Chronic diastolic CHF: - Echo done on 07/28/2023 revealed EF of 50 to 55%. - Compensated.   Aortic stenosis: - S/p TAVR. - Stable.   End-stage renal disease: - On hemodialysis Monday, Wednesday and Friday. - Continue hemodialysis on discharge.     Hypertension: - Optimized. - Continue to monitor blood pressure closely on discharge.   Anemia: - Likely multifactorial. - History of end-stage renal disease. - Hemoglobin of 10.6 g/dL today, with MCV of 11.2. - Hemoglobin is at goal (for ESRD).   Hypothyroidism: -Continue Synthroid  100 mcg p.o. once daily.   Diabetes mellitus type 2: - Continue to optimize. - Low threshold to start sliding  scale insulin  coverage.    Consults: cardiology and nephrology  Significant Diagnostic Studies/Procedures:  Cardiac catheterization revealed:   Mid LAD lesion is 80% stenosed.   Dist LAD-1 lesion is 99% stenosed.   Dist LAD-2 lesion is 80% stenosed.   Prox Cx to Mid Cx lesion is 80% stenosed.   Mid Cx to Dist Cx lesion is 90% stenosed.   1st Diag lesion is 100% stenosed.   Prox RCA lesion is 30% stenosed.   1.  Relatively unchanged disease when compared to her previous study with diffuse disease of the mid to proximal LAD with high-grade serial stenoses.  The caliber of the LAD is very small.  Extensive stenting would need to be required with questionable durability given small vessel size. 2.  Progression of distal left circumflex lesion; again this is a small artery and should be treated medically. 3.  Mild RCA disease. 4.  LVEDP of 26 mmHg.   Summary: Given very small caliber vessels with diffuse disease and the need for extensive stenting particularly of the LAD which would be associated with uncertain durability, medical therapy should be pursued.  Will start DAPT with Plavix for medical treatment of acute coronary syndrome.  Discontinue heparin  drip.  4 hours bedrest given Mynx closure.   CXR revealed: Low volume film with pulmonary vascular congestion.    Echo revealed: 1. Left ventricular ejection fraction, by estimation, is 45 to 50%. Left  ventricular ejection fraction by PLAX is 53 %. The left ventricle has  mildly decreased function. The left ventricle demonstrates regional wall  motion abnormalities (see scoring  diagram/findings for description). There is mild left ventricular  hypertrophy. Left ventricular diastolic parameters are consistent with  Grade I diastolic  dysfunction (impaired relaxation). There is moderate  hypokinesis of the left ventricular, apical  apical segment and lateral wall.   2. Right ventricular systolic function is low normal. The right   ventricular size is normal. There is normal pulmonary artery systolic  pressure. The estimated right ventricular systolic pressure is 33.0 mmHg.   3. The mitral valve is degenerative. Mild mitral valve regurgitation.  Moderate to severe mitral annular calcification.   4. The aortic valve has been repaired/replaced. Aortic valve  regurgitation is not visualized. There is a 26 mm Sapien prosthetic (TAVR)  valve present in the aortic position. Aortic valve area, by VTI measures  1.29 cm. Aortic valve mean gradient  measures 4.5 mmHg. Aortic valve Vmax measures 1.42 m/s. Peak gradient 8.1  mmHg, DI 0.64.   5. The inferior vena cava is normal in size with greater than 50%  respiratory variability, suggesting right atrial pressure of 3 mmHg.   Comparison(s): Changes from prior study are noted. 07/28/2023: LVEF 50-55%,  Sapien TAVR - MG 6 mmHg.    Troponin: -Troponin ranges from 600 to 15,653.   Treatments: Medical management.  Discharge Exam: Blood pressure (!) 110/56, pulse 63, temperature 97.8 F (36.6 C), temperature source Oral, resp. rate 20, height 5' 1 (1.549 m), weight 76.8 kg, SpO2 100%.   Disposition: Discharge disposition: 01-Home or Self Care       Discharge Instructions     Amb Referral to Cardiac Rehabilitation   Complete by: As directed    Diagnosis: NSTEMI   After initial evaluation and assessments completed: Virtual Based Care may be provided alone or in conjunction with Phase 2 Cardiac Rehab based on patient barriers.: Yes   Intensive Cardiac Rehabilitation (ICR) [facility] location only OR Traditional Cardiac Rehabilitation (TCR) *If criteria for ICR are not met will enroll in TCR [facility] only): Yes   Diet - low sodium heart healthy   Complete by: As directed    Diet Carb Modified   Complete by: As directed    Discharge wound care:   Complete by: As directed    Continue current wound care plan   Increase activity slowly   Complete by: As directed        Allergies as of 10/23/2023       Reactions   Coffee Flavoring Agent (non-screening) Other (See Comments)   Sick on stomach, sneezing, backed up   Azithromycin  Other (See Comments)   Fluogen [influenza Virus Vaccine]    Molds & Smuts    Allergy test showed    Pneumococcal Vac Polyvalent Other (See Comments)   Large site reaction    Tobacco Nausea And Vomiting   Versed  [midazolam ] Other (See Comments)   No memory altering medications. Patient request.   Cephalexin  Other (See Comments)   Yeast infection TOLERATED CEFAZOLIN  PRIOR   Ciprofloxacin Other (See Comments)   tendinitis   Oatmeal Other (See Comments)   Sneezing and drainage   Sulfa Antibiotics Other (See Comments)   Yeast infection        Medication List     STOP taking these medications    amoxicillin  500 MG capsule Commonly known as: AMOXIL    betamethasone valerate 0.1 % cream Commonly known as: VALISONE   COQ-10 PO   D-MANNOSE PO   Folic Acid -Vit B6-Vit B12 2.5-25-1 MG Tabs tablet Commonly known as: FOLBEE   fosfomycin 3 g Pack Commonly known as: MONUROL    furosemide  40 MG tablet Commonly known as: LASIX    NovoLOG  Mix 70/30 FlexPen (70-30)  100 UNIT/ML FlexPen Generic drug: insulin  aspart protamine  - aspart   pantoprazole  40 MG tablet Commonly known as: PROTONIX        TAKE these medications    aspirin  EC 81 MG tablet Take 1 tablet (81 mg total) by mouth daily. Swallow whole. Start taking on: October 24, 2023   clopidogrel 75 MG tablet Commonly known as: PLAVIX Take 1 tablet (75 mg total) by mouth daily. Start taking on: October 24, 2023   ezetimibe  10 MG tablet Commonly known as: ZETIA  Take 10 mg by mouth daily.   midodrine  5 MG tablet Commonly known as: PROAMATINE  Take 1 tablet (5 mg total) by mouth 3 (three) times daily with meals. What changed: when to take this   nitroGLYCERIN  0.4 MG SL tablet Commonly known as: NITROSTAT  Place 1 tablet (0.4 mg total) under the tongue every  5 (five) minutes as needed for chest pain.   pravastatin 20 MG tablet Commonly known as: PRAVACHOL Take 1 tablet (20 mg total) by mouth daily at 6 PM.   Synthroid  100 MCG tablet Generic drug: levothyroxine  Take 50-100 mcg by mouth See admin instructions. Take 100mcg (1 tablet) by mouth daily except on Monday and Friday's, take 50mcg (1/2 tablet).               Discharge Care Instructions  (From admission, onward)           Start     Ordered   10/23/23 0000  Discharge wound care:       Comments: Continue current wound care plan   10/23/23 1236            Follow-up Information     Avva, Ravisankar, MD Follow up.   Specialty: Internal Medicine Why: Doctor's office will call patient to set up follow up apt Contact information: [address] [place] [address] [phone]                Time spent: 35 Minutes.  SignedBETHA Leatrice LILLETTE Clayton 10/23/2023, 12:36 PM

## 2023-10-23 NOTE — Progress Notes (Signed)
 CARDIAC REHAB PHASE I   Pt just completed dialysis.Ed given to pt and family. Discussed restrictions, MI booklet, NTG use, ASA and Plavix, stent, site care, heart healthy diet, and exercise guidelines. Pt left in bed w/ call bell in reach. Will refer to [facility] [place]. Will continue to follow.  [id number] Johnnie JINNY Moats, MS, ACSM-CEP 10/23/2023 12:01 PM

## 2023-10-23 NOTE — Progress Notes (Signed)
 [facility] Progress Note  Subjective:  Had more CP and used SL ntg  Vitals:   10/23/23 1030 10/23/23 1103 10/23/23 1105 10/23/23 1140  BP: (!) 101/49 (!) 99/50 (!) 111/49 (!) 110/56  Pulse: 65 64 62 63  Resp: (!) 21 (!) 23 20 20   Temp:  98.2 F (36.8 C)  97.8 F (36.6 C)  TempSrc:  Oral  Oral  SpO2: 96% 99% 98% 100%  Weight:  76.8 kg    Height:        Exam: Gen alert, no distress, on RA No jvd or bruits Chest clear bilat to bases RRR no MRG Abd soft ntnd no mass or ascites +bs Ext no LE edema Neuro is alert, Ox 3 , nf       Home bp meds: Midodrine  5mg  pre hd MWF Lasix  40mg  every day on non hd days    OP HD: [facility] MWF  3.5h  B350  76.9kg  2K bath  TDC  Heparin  4000 Mircera 100 mcg q 2 wks, just given on 10/1     Assessment/ Plan: Chest pain/ NSTEMI: troponins ^^. SP LHC 10/02 --> severe CAD, medical Rx.  LVEDP was 26.  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 Had short HD yesterday due to [facility] malfunction. Placed TPA overnight in catheter and the catheter seems to be working much better today.  BP: not on any bp lowering meds at home. BP's wnl here.  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 Just rec'd esa, follow.  H/o TAVR H/o leadless PM     Rob Chanan Detwiler MD  [facility] 10/23/2023, 2:34 PM  Recent Labs  Lab 10/21/23 0750 10/21/23 1356 10/22/23 0252 10/22/23 1854 10/23/23 0815  HGB 9.3*   < > 10.6*  --  9.4*  ALBUMIN  2.7*  --   --   --  3.0*  CALCIUM  7.2*   < >  --  8.7* 8.6*  PHOS  --   --   --   --  3.5  CREATININE 2.21*   < >  --  3.17* 4.13*  K 2.5*   < >  --  3.9 3.7   < > = values in this interval not displayed.   No results for input(s): IRON , TIBC, FERRITIN in the last 168 hours. Inpatient medications:  aspirin  EC  81 mg Oral Daily   Chlorhexidine  Gluconate Cloth  6 each Topical Q0600   clopidogrel  75 mg Oral Daily   ezetimibe   10 mg Oral Daily   insulin  aspart  0-9 Units Subcutaneous TID WC    levothyroxine   100 mcg Oral Once per day on Sunday Tuesday Wednesday Thursday Saturday   levothyroxine   50 mcg Oral Once per day on Monday Friday   midodrine   5 mg Oral TID WC   pantoprazole  (PROTONIX ) IV  40 mg Intravenous Q12H   pravastatin  20 mg Oral q1800     acetaminophen , hydrALAZINE , nitroGLYCERIN , ondansetron  (ZOFRAN ) IV

## 2023-10-23 NOTE — TOC Transition Note (Signed)
 Transition of Care [facility]) - Discharge Note   Patient Details  Name: Rhonda Clayton MRN: [id number] Date of Birth: 1945-11-05  [facility]) CM/SW Contact:  Roxie KANDICE Stain, RN Phone Number: 10/23/2023, 1:34 PM   Clinical Narrative:    Rhonda Clayton is stable to discharge home. Follow up apt on [facility]. Notified Cory with [facility] of discharge.    Final next level of care: Home w Home Health Services Barriers to Discharge: Barriers Resolved   Patient Goals and CMS Choice Patient states their goals for this hospitalization and ongoing recovery are:: return home CMS Medicare.gov Compare Post Acute Care list provided to:: Patient Choice offered to / list presented to : Patient      Discharge Placement               Home        Discharge Plan and Services Additional resources added to the After Visit Summary for   In-house Referral: NA Discharge Planning Services: CM Consult Post Acute Care Choice: Resumption of Svcs/PTA Provider, Home Health          DME Arranged: N/A DME Agency: NA       HH Arranged: PT HH Agency: [facility] Date [facility] Agency Contacted: 10/22/23 Time HH Agency Contacted: 1049 Representative spoke with at [facility] Agency: Darleene  Social Drivers of Health (SDOH) Interventions SDOH Screenings   Food Insecurity: No Food Insecurity (07/23/2023)  Housing: Low Risk  (07/23/2023)  Transportation Needs: No Transportation Needs (07/23/2023)  Utilities: Not At Risk (07/23/2023)  Depression (PHQ2-9): Low Risk  (06/10/2023)  Social Connections: Patient Declined (07/23/2023)  Tobacco Use: Low Risk  (10/21/2023)     Readmission Risk Interventions    10/22/2023   10:44 AM 07/26/2023    4:09 PM  Readmission Risk Prevention Plan  Transportation Screening Complete Complete  PCP or Specialist Appt within 3-5 Days Complete   HRI or Home Care Consult Complete Complete  Social Work Consult for Recovery Care Planning/Counseling  Complete  Palliative Care  Screening Not Applicable Not Applicable  Medication Review Oceanographer) Complete Referral to Pharmacy

## 2023-10-23 NOTE — Progress Notes (Signed)
 Discharge  Pt verbally understands discharge POC.  Extensive explanation and education with teachback regarding site care.  NO PIV or TELE on during discharge.  Son at bedside. TOCmeds at bedside.  Transferred to Main A.

## 2023-10-23 NOTE — Progress Notes (Signed)
 Received patient in bed to unit.  Alert and oriented.  Informed consent signed and in chart.   TX duration:2.5 hours  Patient tolerated well.  Transported back to the room  Alert, without acute distress.  Hand-off given to patient's nurse.   Access used: Right internal jugular HD cath Access issues: A-V and V-A with BFR to 350 due to high arterial pressures  Total UF removed: 2L Medication(s) given: NitroStat , see MAR   10/23/23 1103  Vitals  Temp 98.2 F (36.8 C)  Temp Source Oral  BP (!) 99/50  Pulse Rate 64  ECG Heart Rate 66  Resp (!) 23  Weight 76.8 kg  Type of Weight Post-Dialysis  Oxygen Therapy  SpO2 99 %  O2 Device Room Air  During Treatment Monitoring  HD Safety Checks Performed Yes  Intra-Hemodialysis Comments Tx completed  Post Treatment  Dialyzer Clearance Lightly streaked  Liters Processed 55.7  Fluid Removed (mL) 2000 mL  Tolerated HD Treatment No (Comment)  Post-Hemodialysis Comments BFR to 350 and A-V and V-A due high arterial pressures  Hemodialysis Catheter Right Internal jugular Double lumen Permanent (Tunneled)  Placement Date/Time: 07/22/23 1155   Placed prior to admission: No  Serial / Lot #: [id number]  Expiration Date: 08/31/27  Time Out: Correct patient;Correct site;Correct procedure  Maximum sterile barrier precautions: Hand hygiene;Large sterile sheet;...  Site Condition No complications  Blue Lumen Status Flushed;Heparin  locked;Antimicrobial dead end cap  Red Lumen Status Flushed;Heparin  locked;Antimicrobial dead end cap  Purple Lumen Status N/A     Camellia Brasil LPN Kidney Dialysis [facility]

## 2023-10-24 NOTE — Discharge Planning (Signed)
 [facility] Patient Discharge Orders- [facility] CLINIC: [facility]  Patient's name: Rhonda Clayton Admit/DC Dates: 10/21/2023 - 10/23/2023  Discharge Diagnoses: NSTEMI s/p cardiac cath w/relatively unchanged severe multivessel CAD plan for medical management.   HD ORDER CHANGES: Heparin  change: no EDW Change: no Bath Change: no       ANEMIA MANAGEMENT: Aranesp : Given: no    PRBC's Given: no  ESA dose for discharge: no change IV Iron  dose at discharge: no change   BONE/MINERAL MEDICATIONS: Hectorol/Calcitriol change: no Sensipar/Parsabiv change: no   ACCESS INTERVENTION/CHANGE: Required TPA due to malfunctioning catheter.    RECENT LABS: Recent Labs  Lab 10/23/23 0815  K 3.7  CALCIUM  8.6*  ALBUMIN  3.0*  PHOS 3.5   Recent Labs  Lab 10/23/23 0815  HGB 9.4*   Blood Culture    Component Value Date/Time   SDES EXPECTORATED SPUTUM 01/04/2023 1249   SDES  01/04/2023 1249    EXPECTORATED SPUTUM Performed at [facility], [address] [place], [place] [address]    SPECREQUEST NONE 01/04/2023 1249   SPECREQUEST  01/04/2023 1249    NONE Reflexed from [id number] Performed at [facility], [address] [place], [place] [address]    CULT  01/04/2023 1249    RARE Normal respiratory flora-no Staph aureus or Pseudomonas seen Performed at [facility], [address], [place], [place] [address]    REPTSTATUS 01/04/2023 FINAL 01/04/2023 1249   REPTSTATUS 01/06/2023 FINAL 01/04/2023 1249       IV ANTIBIOTICS: none Details:   OTHER ANTICOAGULATION:  On Eliquis: no On Coumadin: no   OTHER/APPTS/LAB ORDERS:     D/C Meds to be reconciled by nurse after every discharge.  Completed By: Manuelita Labella PA-C   Reviewed by: MD:______ RN_______

## 2023-10-25 ENCOUNTER — Telehealth: Payer: Self-pay | Admitting: Internal Medicine

## 2023-10-25 DIAGNOSIS — N186 End stage renal disease: Secondary | ICD-10-CM | POA: Diagnosis not present

## 2023-10-25 DIAGNOSIS — D688 Other specified coagulation defects: Secondary | ICD-10-CM | POA: Diagnosis not present

## 2023-10-25 DIAGNOSIS — Z992 Dependence on renal dialysis: Secondary | ICD-10-CM | POA: Diagnosis not present

## 2023-10-25 DIAGNOSIS — E1122 Type 2 diabetes mellitus with diabetic chronic kidney disease: Secondary | ICD-10-CM | POA: Diagnosis not present

## 2023-10-25 DIAGNOSIS — I2510 Atherosclerotic heart disease of native coronary artery without angina pectoris: Secondary | ICD-10-CM | POA: Diagnosis not present

## 2023-10-25 DIAGNOSIS — T8249XA Other complication of vascular dialysis catheter, initial encounter: Secondary | ICD-10-CM | POA: Diagnosis not present

## 2023-10-25 DIAGNOSIS — E876 Hypokalemia: Secondary | ICD-10-CM | POA: Diagnosis not present

## 2023-10-25 DIAGNOSIS — N2581 Secondary hyperparathyroidism of renal origin: Secondary | ICD-10-CM | POA: Diagnosis not present

## 2023-10-25 DIAGNOSIS — D500 Iron deficiency anemia secondary to blood loss (chronic): Secondary | ICD-10-CM | POA: Diagnosis not present

## 2023-10-25 NOTE — Telephone Encounter (Signed)
 Pt c/o medication issue:  1. Name of Medication:   midodrine  (PROAMATINE ) 5 MG tablet   2. How are you currently taking this medication (dosage and times per day)?   Not taking Midodrine   3. Are you having a reaction (difficulty breathing--STAT)?   4. What is your medication issue?   Patient stated her BP has been trending high and stopped taking this medication.  Patient stated she has been having chest pain during dialysis and taking one nitroGLYCERIN  (NITROSTAT ) 0.4 MG SL tablet (Expired) takes the pain away.  Patient noted she was released from the hospital on Saturday (10/4).  Patient also wants to confirm how she should be taking pantoprazole  (PROTONIX ) 40 MG tablet.  Patient stated she will be going to dialysis today and can leave a voicemail or can call back tomorrow morning.

## 2023-10-25 NOTE — Telephone Encounter (Signed)
Unable to reach pt or leave a message mailbox is full 

## 2023-10-25 NOTE — Progress Notes (Signed)
 Late Note Entry- October 25, 2023  Pt d/c on Saturday. Contacted [facility] this morning to be advised of pt's d/c date and that pt should resume care today.   Randine Mungo Dialysis Navigator [phone]

## 2023-10-27 ENCOUNTER — Telehealth (HOSPITAL_COMMUNITY): Payer: Self-pay

## 2023-10-27 NOTE — Telephone Encounter (Signed)
 Phase II referral, recently closed previous referral for cardiac rehab. Referral is marked that patient is not interested.  Closing referral.

## 2023-10-28 DIAGNOSIS — E785 Hyperlipidemia, unspecified: Secondary | ICD-10-CM | POA: Diagnosis not present

## 2023-10-28 DIAGNOSIS — I5042 Chronic combined systolic (congestive) and diastolic (congestive) heart failure: Secondary | ICD-10-CM | POA: Diagnosis not present

## 2023-10-28 DIAGNOSIS — R8290 Unspecified abnormal findings in urine: Secondary | ICD-10-CM | POA: Diagnosis not present

## 2023-10-28 DIAGNOSIS — Z992 Dependence on renal dialysis: Secondary | ICD-10-CM | POA: Diagnosis not present

## 2023-10-28 DIAGNOSIS — N186 End stage renal disease: Secondary | ICD-10-CM | POA: Diagnosis not present

## 2023-10-28 DIAGNOSIS — N390 Urinary tract infection, site not specified: Secondary | ICD-10-CM | POA: Diagnosis not present

## 2023-10-28 DIAGNOSIS — M109 Gout, unspecified: Secondary | ICD-10-CM | POA: Diagnosis not present

## 2023-10-28 DIAGNOSIS — F39 Unspecified mood [affective] disorder: Secondary | ICD-10-CM | POA: Diagnosis not present

## 2023-10-28 DIAGNOSIS — I350 Nonrheumatic aortic (valve) stenosis: Secondary | ICD-10-CM | POA: Diagnosis not present

## 2023-10-28 DIAGNOSIS — R300 Dysuria: Secondary | ICD-10-CM | POA: Diagnosis not present

## 2023-10-28 DIAGNOSIS — I25118 Atherosclerotic heart disease of native coronary artery with other forms of angina pectoris: Secondary | ICD-10-CM | POA: Diagnosis not present

## 2023-10-28 DIAGNOSIS — I214 Non-ST elevation (NSTEMI) myocardial infarction: Secondary | ICD-10-CM | POA: Diagnosis not present

## 2023-10-28 DIAGNOSIS — Z952 Presence of prosthetic heart valve: Secondary | ICD-10-CM | POA: Diagnosis not present

## 2023-10-28 DIAGNOSIS — E1122 Type 2 diabetes mellitus with diabetic chronic kidney disease: Secondary | ICD-10-CM | POA: Diagnosis not present

## 2023-10-29 ENCOUNTER — Encounter (HOSPITAL_COMMUNITY): Payer: Self-pay

## 2023-10-29 ENCOUNTER — Emergency Department (HOSPITAL_COMMUNITY)
Admission: EM | Admit: 2023-10-29 | Discharge: 2023-10-29 | Disposition: A | Discharge status: Home / Self Care | Attending: Emergency Medicine | Admitting: Emergency Medicine

## 2023-10-29 ENCOUNTER — Emergency Department (HOSPITAL_COMMUNITY)

## 2023-10-29 ENCOUNTER — Telehealth: Payer: Self-pay | Admitting: Student in an Organized Health Care Education/Training Program

## 2023-10-29 ENCOUNTER — Other Ambulatory Visit: Payer: Self-pay

## 2023-10-29 ENCOUNTER — Other Ambulatory Visit (HOSPITAL_COMMUNITY): Payer: Self-pay

## 2023-10-29 DIAGNOSIS — I130 Hypertensive heart and chronic kidney disease with heart failure and stage 1 through stage 4 chronic kidney disease, or unspecified chronic kidney disease: Secondary | ICD-10-CM | POA: Diagnosis not present

## 2023-10-29 DIAGNOSIS — R079 Chest pain, unspecified: Secondary | ICD-10-CM | POA: Diagnosis not present

## 2023-10-29 DIAGNOSIS — E039 Hypothyroidism, unspecified: Secondary | ICD-10-CM | POA: Diagnosis not present

## 2023-10-29 DIAGNOSIS — E1122 Type 2 diabetes mellitus with diabetic chronic kidney disease: Secondary | ICD-10-CM | POA: Diagnosis not present

## 2023-10-29 DIAGNOSIS — Z7982 Long term (current) use of aspirin: Secondary | ICD-10-CM | POA: Diagnosis not present

## 2023-10-29 DIAGNOSIS — R9389 Abnormal findings on diagnostic imaging of other specified body structures: Secondary | ICD-10-CM | POA: Diagnosis not present

## 2023-10-29 DIAGNOSIS — R0602 Shortness of breath: Secondary | ICD-10-CM | POA: Diagnosis not present

## 2023-10-29 DIAGNOSIS — R0789 Other chest pain: Secondary | ICD-10-CM | POA: Diagnosis present

## 2023-10-29 DIAGNOSIS — I10 Essential (primary) hypertension: Secondary | ICD-10-CM | POA: Diagnosis not present

## 2023-10-29 DIAGNOSIS — N189 Chronic kidney disease, unspecified: Secondary | ICD-10-CM | POA: Diagnosis not present

## 2023-10-29 DIAGNOSIS — I509 Heart failure, unspecified: Secondary | ICD-10-CM | POA: Diagnosis not present

## 2023-10-29 DIAGNOSIS — Z7901 Long term (current) use of anticoagulants: Secondary | ICD-10-CM | POA: Diagnosis not present

## 2023-10-29 DIAGNOSIS — I25110 Atherosclerotic heart disease of native coronary artery with unstable angina pectoris: Secondary | ICD-10-CM

## 2023-10-29 DIAGNOSIS — Z794 Long term (current) use of insulin: Secondary | ICD-10-CM | POA: Insufficient documentation

## 2023-10-29 DIAGNOSIS — I209 Angina pectoris, unspecified: Secondary | ICD-10-CM | POA: Diagnosis not present

## 2023-10-29 LAB — BASIC METABOLIC PANEL WITH GFR
Anion gap: 11 (ref 5–15)
BUN: 26 mg/dL — ABNORMAL HIGH (ref 8–23)
CO2: 21 mmol/L — ABNORMAL LOW (ref 22–32)
Calcium: 8.5 mg/dL — ABNORMAL LOW (ref 8.9–10.3)
Chloride: 106 mmol/L (ref 98–111)
Creatinine, Ser: 3.52 mg/dL — ABNORMAL HIGH (ref 0.44–1.00)
GFR, Estimated: 13 mL/min — ABNORMAL LOW (ref >60)
Glucose, Bld: 198 mg/dL — ABNORMAL HIGH (ref 70–99)
Potassium: 3.7 mmol/L (ref 3.5–5.1)
Sodium: 138 mmol/L (ref 135–145)

## 2023-10-29 LAB — CBC
HCT: 29.1 % — ABNORMAL LOW (ref 36.0–46.0)
Hemoglobin: 8.7 g/dL — ABNORMAL LOW (ref 12.0–15.0)
MCH: 28.9 pg (ref 26.0–34.0)
MCHC: 29.9 g/dL — ABNORMAL LOW (ref 30.0–36.0)
MCV: 96.7 fL (ref 80.0–100.0)
Platelets: 209 K/uL (ref 150–400)
RBC: 3.01 MIL/uL — ABNORMAL LOW (ref 3.87–5.11)
RDW: 19.3 % — ABNORMAL HIGH (ref 11.5–15.5)
WBC: 6.4 K/uL (ref 4.0–10.5)
nRBC: 0 % (ref 0.0–0.2)

## 2023-10-29 LAB — TROPONIN I (HIGH SENSITIVITY)
Troponin I (High Sensitivity): 91 ng/L — ABNORMAL HIGH (ref <18)
Troponin I (High Sensitivity): 100 ng/L (ref <18)

## 2023-10-29 LAB — CBG MONITORING, ED: Glucose-Capillary: 148 mg/dL — ABNORMAL HIGH (ref 70–99)

## 2023-10-29 MED ORDER — AMLODIPINE BESYLATE 2.5 MG PO TABS
2.5000 mg | ORAL_TABLET | Freq: Every day | ORAL | 0 refills | Status: AC
  Filled 2023-10-29: qty 30, 30d supply, fill #0

## 2023-10-29 MED ORDER — METOPROLOL SUCCINATE ER 25 MG PO TB24
50.0000 mg | ORAL_TABLET | Freq: Every day | ORAL | 0 refills | Status: DC
  Filled 2023-10-29: qty 60, 30d supply, fill #0

## 2023-10-29 NOTE — ED Provider Notes (Signed)
 [facility] AT [facility] Provider Note   CSN: [id number] Arrival date & time: 10/29/23  1106     Rhonda Clayton presents with: Chest Pain and Shortness of Breath   Rhonda Clayton is a 78 y.o. female.    Chest Pain Associated symptoms: shortness of breath   Shortness of Breath Associated symptoms: chest pain    Rhonda Clayton is an 78 year old female with past medical history significant for CAD had angiography done 10/2 with diffuse disease but no targetable lesion for PCI  She has a history of CKD, CHF, DM 2, HTN, hypothyroidism and aortic stenosis  Rhonda Clayton brought in by EMS for chest pain Rhonda Clayton took 3 nitroglycerin  and 324 of aspirin  before EMS arrival had complete resolution of pain however had returned of pain and route to emergency department and EMS provided 1 additional nitroglycerin  and Rhonda Clayton is now pain-free.  She is continue to be pain-free does not feel short of breath nauseous or have any other symptoms.  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  She is on Plavix but no other antiplatelets and not on any DOAC  She denies any fevers chills nausea or vomiting no current chest pain or difficulty breathing.  She has had no episodes of syncope or near syncope.  She denies any urinary frequency urgency hematuria or abdominal pain     Prior to Admission medications   Medication Sig Start Date End Date Taking? Authorizing Provider  aspirin  EC 81 MG tablet Take 1 tablet (81 mg total) by mouth daily. Swallow whole. 10/24/23   Rosario Leatrice FERNS, MD  betamethasone valerate (VALISONE) 0.1 % cream Apply 1 Application topically as needed (Dermatitis). 02/23/23   [provider]  clopidogrel (PLAVIX) 75 MG tablet Take 1 tablet (75 mg total) by mouth daily. 10/24/23   Rosario Leatrice FERNS, MD  ezetimibe  (ZETIA ) 10 MG tablet Take 10 mg by mouth daily.    [provider]  Folic Acid -Vit B6-Vit B12 (FOLBEE) 2.5-25-1 MG TABS  tablet Take 1 tablet by mouth at bedtime.    [provider]  furosemide  (LASIX ) 40 MG tablet Take 1 tablet (40 mg total) by mouth See admin instructions. Take on non-dialysis days. 07/31/23   Goodrich, Callie E, PA-C  levothyroxine  (SYNTHROID ) 100 MCG tablet Take 50-100 mcg by mouth See admin instructions. Take 100mcg (1 tablet) by mouth daily except on Monday and Friday's, take 50mcg (1/2 tablet).    [provider]  midodrine  (PROAMATINE ) 5 MG tablet Take 1 tablet (5 mg total) by mouth 3 (three) times daily with meals. 10/23/23   Rosario Leatrice FERNS, MD  nitroGLYCERIN  (NITROSTAT ) 0.4 MG SL tablet Place 1 tablet (0.4 mg total) under the tongue every 5 (five) minutes as needed for chest pain. 06/01/23 10/21/23  Camnitz, Soyla Lunger, MD  NOVOLOG  MIX 70/30 FLEXPEN (70-30) 100 UNIT/ML FlexPen Inject 9-15 Units into the skin in the morning and at bedtime. Inject 14-15 units into the skin every morning and 9-10 units at before dinner. According to how many carbohydrates she eats.    [provider]  pantoprazole  (PROTONIX ) 40 MG tablet Take 40 mg by mouth daily as needed (Heartburn). 06/03/23   [provider]  pravastatin (PRAVACHOL) 20 MG tablet Take 1 tablet (20 mg total) by mouth daily at 6 PM. 10/23/23   Rosario Leatrice FERNS, MD    Allergies: Coffee flavoring agent (non-screening), Azithromycin , Fluogen [influenza virus vaccine], Molds & smuts, Pneumococcal vac polyvalent, Tobacco, Versed  [  midazolam ], Cephalexin , Ciprofloxacin, Oatmeal, and Sulfa antibiotics    Review of Systems  Respiratory:  Positive for shortness of breath.   Cardiovascular:  Positive for chest pain.    Updated Vital Signs BP (!) 144/81   Pulse 65   Temp 97.7 F (36.5 C) (Oral)   Resp 16   SpO2 98%   Physical Exam Vitals and nursing note reviewed.  Constitutional:      General: She is not in acute distress. HENT:     Head: Normocephalic and atraumatic.     Nose: Nose normal.      Mouth/Throat:     Mouth: Mucous membranes are moist.  Eyes:     General: No scleral icterus. Cardiovascular:     Rate and Rhythm: Normal rate and regular rhythm.     Pulses: Normal pulses.     Heart sounds: Normal heart sounds.     Comments: Holosystolic murmur Pulmonary:     Effort: Pulmonary effort is normal. No respiratory distress.     Breath sounds: No wheezing.  Abdominal:     Palpations: Abdomen is soft.     Tenderness: There is no abdominal tenderness. There is no guarding or rebound.  Musculoskeletal:     Cervical back: Normal range of motion.     Right lower leg: No edema.     Left lower leg: No edema.  Skin:    General: Skin is warm and dry.     Capillary Refill: Capillary refill takes less than 2 seconds.  Neurological:     Mental Status: She is alert. Mental status is at baseline.  Psychiatric:        Mood and Affect: Mood normal.        Behavior: Behavior normal.     (all labs ordered are listed, but only abnormal results are displayed) Labs Reviewed  BASIC METABOLIC PANEL WITH GFR - Abnormal; Notable for the following components:      Result Value   CO2 21 (*)    Glucose, Bld 198 (*)    BUN 26 (*)    Creatinine, Ser 3.52 (*)    Calcium  8.5 (*)    GFR, Estimated 13 (*)    All other components within normal limits  CBC - Abnormal; Notable for the following components:   RBC 3.01 (*)    Hemoglobin 8.7 (*)    HCT 29.1 (*)    MCHC 29.9 (*)    RDW 19.3 (*)    All other components within normal limits  CBG MONITORING, ED - Abnormal; Notable for the following components:   Glucose-Capillary 148 (*)    All other components within normal limits  TROPONIN I (HIGH SENSITIVITY) - Abnormal; Notable for the following components:   Troponin I (High Sensitivity) 91 (*)    All other components within normal limits  TROPONIN I (HIGH SENSITIVITY)    EKG: EKG Interpretation Date/Time:  Friday October 29 2023 11:21:01 EDT Ventricular Rate:  69 PR  Interval:  237 QRS Duration:  137 QT Interval:  481 QTC Calculation: 516 R Axis:   -60  Text Interpretation: Atrial-ventricular dual-paced rhythm No further analysis attempted due to paced rhythm TWI resolved in anterior leads Confirmed by Charlyn Sora ([id number]) on 10/29/2023 12:37:45 PM  Radiology: DG Chest 2 View Result Date: 10/29/2023 CLINICAL DATA:  Mid chest pain and shortness of breath. EXAM: CHEST - 2 VIEW COMPARISON:  October 21, 2023 FINDINGS: There is stable right-sided venous catheter positioning. The heart size and mediastinal contours are  within normal limits. There is an artificial aortic valve. A radiopaque loop recorder device is noted. Low lung volumes are noted with mild, stable elevation of the right hemidiaphragm. Both lungs are clear. Postoperative changes are present within the lower thoracic and upper lumbar spine. IMPRESSION: Low lung volumes without active cardiopulmonary disease. Electronically Signed   By: Suzen Dials M.D.   On: 10/29/2023 12:29     Procedures   Medications Ordered in the ED - No data to display  Clinical Course as of 10/29/23 1620  Fri Oct 29, 2023  1550 S. Here with CP/SOB. Cardiac cath 10/02 wo PCI - deferred to medical management.  [WB]    Clinical Course User Index [WB] Beam, Elsie, MD                                 Medical Decision Making Amount and/or Complexity of Data Reviewed Labs: ordered. Radiology: ordered.  Risk Prescription drug management.   This Rhonda Clayton presents to the ED for concern of chest pain, this involves a number of treatment options, and is a complaint that carries with it a moderate to high risk of complications and morbidity. A differential diagnosis was considered for the Rhonda Clayton's symptoms which is discussed below:   The emergent causes of chest pain include: Acute coronary syndrome, tamponade, pericarditis/myocarditis, aortic dissection, pulmonary embolism, tension pneumothorax, pneumonia, and  esophageal rupture.    Co morbidities: Discussed in HPI   Brief History:  Rhonda Clayton is an 78 year old female with past medical history significant for CAD had angiography done 10/2 with diffuse disease but no targetable lesion for PCI  She has a history of CKD, CHF, DM 2, HTN, hypothyroidism and aortic stenosis  Rhonda Clayton brought in by EMS for chest pain Rhonda Clayton took 3 nitroglycerin  and 324 of aspirin  before EMS arrival had complete resolution of pain however had returned of pain and route to emergency department and EMS provided 1 additional nitroglycerin  and Rhonda Clayton is now pain-free.  She is continue to be pain-free does not feel short of breath nauseous or have any other symptoms.  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  She is on Plavix but no other antiplatelets and not on any DOAC  She denies any fevers chills nausea or vomiting no current chest pain or difficulty breathing.  She has had no episodes of syncope or near syncope.  She denies any urinary frequency urgency hematuria or abdominal pain    EMR reviewed including pt PMHx, past surgical history and past visits to ER.   See HPI for more details   Lab Tests:   I ordered and independently interpreted labs. Labs notable for Troponin significantly downtrending from prior and flat at 91/100.  Discussed with Dr. Elmira of cardiology who evaluated Rhonda Clayton.  Made recommendation to start Rhonda Clayton on some additional blood pressure medications including metoprolol  and amlodipine .  Imaging Studies:  NAD. I personally reviewed all imaging studies and no acute abnormality found. I agree with radiology interpretation.    Cardiac Monitoring:  The Rhonda Clayton was maintained on a cardiac monitor.  I personally viewed and interpreted the cardiac monitored which showed an underlying rhythm of: paced EKG non-ischemic   Medicines ordered:   Critical Interventions:     Consults/Attending Physician   I  discussed this case with my attending physician who cosigned this note including Rhonda Clayton's presenting symptoms, physical exam, and planned diagnostics and interventions. Attending physician  stated agreement with plan or made changes to plan which were implemented.  Troponin significantly downtrending from prior and flat at 91/100.  Discussed with Dr. Elmira of cardiology who evaluated Rhonda Clayton.  Made recommendation to start Rhonda Clayton on some additional blood pressure medications including metoprolol  and amlodipine .  Reevaluation:  After the interventions noted above I re-evaluated Rhonda Clayton and found that they have :stayed the same -no pain, no symptoms   Social Determinants of Health:      Problem List / ED Course:  Rhonda Clayton with diffuse severe disease but no targetable lesions on cath that was done earlier this month is being treated medically has flat troponins that are significantly decreased from prior visit.  Cardiology has evaluated Rhonda Clayton and recommended additional medications.  Strict return precautions emergency room provided to Rhonda Clayton.  I make sure that she understands that she should return to the ER if she has any chest pain.   Dispostion:  After consideration of the diagnostic results and the patients response to treatment, I feel that the patent would benefit from outpatient follow-up. Final diagnoses:  Angina pectoris    ED Discharge Orders          Ordered    metoprolol  succinate (TOPROL -XL) 25 MG 24 hr tablet  Daily       Note to Pharmacy: 30 days per MD. MD wants 25mg  tabs as well for titrating.mll   10/29/23 1630    amLODipine  (NORVASC ) 2.5 MG tablet  Daily        10/29/23 1630               Neldon Inoue [employer], [place] 10/30/23 [id number]    Charlyn Sora, MD 10/30/23 1017

## 2023-10-29 NOTE — Telephone Encounter (Signed)
 Received a call to the on-call cardiology pager by Ms. Lutterman. In summary, she was recently admitted with chest pain found to have unstable angina with nonrevascularizable disease most notably in her LAD.  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   He calls today because several medication changes have been made in her last 2 hospital interactions. Last week when she was discharged from the hospital she was asked to begin midodrine  trice daily, a medication that she usually only takes with dialysis for hypotension. Today, after coming into the emergency department for unstable angina she was recommended to begin metoprolol  succinate 50 my daily with amlodipine  2.5 mg. Her systolic blood pressure is 114 mmHg and she is concerned that taking metoprolol  will lower her blood pressure and cause further issues.  Given the tenuosity of  her blood pressure, and her neurovascular liable disease. I recommended that if her blood pressure goes above 140 for her to take the metoprolol  as recommended today. However, that these medication changes are best done in clinic by her general cardiologist. I will message her general cardiologist and request for an appointment early next week.  Merlene Blood, MD MS  Cardiology [facility]

## 2023-10-29 NOTE — Consult Note (Signed)
 Cardiology Consultation   Patient ID: MURIAH HARSHA MRN: [id number]; DOB: 12/03/1945  Admit date: 10/29/2023 Date of Consult: 10/29/2023  PCP:  [name], MD   [facility] Providers Cardiologist:  Lurena MARLA Red, MD  Electrophysiologist:  Soyla Gladis Norton, MD  Structural Heart:  [name], MD      Patient Profile:   Rhonda Clayton is a 78 y.o. female with a hx of CAD who is being seen 10/29/2023 for the evaluation of chest pain at the request of Emilie Fanti, MD.  History of Present Illness:   Rhonda Clayton is a 78 y/o female w/prior hypertension but recent hypotension, hyperlipidemia, type 2 DM, CAD, s/p TAVR, ESRD on HD, recent medically treated NSTEMI due to diffuse disease in LAD, back with chest pain.  Patient was admitted about a week ago with chest pain, NSTEMI, peak troponin 15,000.  Her LAD has diffuse disease and small caliber, therefore felt not appropriate for percutaneous intervention or surgical intervention for that matter.  Today, she had chest pain while standing up, similar in quality, but lesser intensity compared to last week.  She took 3 nitroglycerin  and admitted to the hospital.  At baseline, she lives alone, drives herself to dialysis and is quite independent.  Since her last week hospitalization, she has had some help to take her to dialysis sessions.  Blood pressure is elevated today, was low on discharge last week.  She was on 3 blood pressure medications before, but were taken off when she started on dialysis as blood pressure was lower.  Blood pressure drops during dialysis sessions requiring midodrine  use, but otherwise is elevated.   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 Cash and Dr. Lucas.   Stress incontinence    Torn rotator cuff    Bilateral   Type 2 diabetes mellitus (HCC)    Venous insufficiency    Vitreous hemorrhage, left (HCC) 05/25/2019   The nature of the vitreous hemorrhage was discussed with the patient as well as the common causes.   Patients with diabetic eye disease may develop retinal neovascularization.  Other eye conditions develop retinal  neovascularization secondary to retinal venous occlusions.   Vitreous hemorrhage may result from spontaneous vitreous detachment or retinal breaks.  Blunt trauma is a common cause as wl    Past Surgical History:  Procedure Laterality Date   ABDOMINAL AORTOGRAM N/A 07/14/2023   Procedure: ABDOMINAL AORTOGRAM;  Surgeon: Red Lurena MARLA, MD;  Location: [facility];  Service: Cardiovascular;  Laterality: N/A;   ABDOMINAL EXPOSURE N/A 05/09/2014   Procedure: ABDOMINAL EXPOSURE;  Surgeon: Krystal JULIANNA Doing, MD;  Location: [facility];  Service: Vascular;  Laterality: N/A;   ANTERIOR LAT LUMBAR FUSION Right 05/09/2014   Procedure: Lumbar three-four, Lumbar four-five Anterior lateral lumbar fusion;  Surgeon: Fairy Levels, MD;  Location: [facility];  Service: Neurosurgery;  Laterality: Right;   ANTERIOR LUMBAR FUSION N/A 05/09/2014   Procedure: Stage 1:Lumbar four-five, Lumbar five-Sacral one Anterior lumbar interbody fusion with Dr. Oris for approach ;  Surgeon: Fairy Levels, MD;  Location: [facility];  Service: Neurosurgery;  Laterality: N/A;   CHOLECYSTECTOMY  1987   INCONTINENCE SURGERY     INSERTION OF DIALYSIS CATHETER Right 06/17/2023   Procedure: INSERTION OF DIALYSIS CATHETER;  Surgeon: Pearline Norman RAMAN, MD;  Location: [facility];  Service:  Vascular;  Laterality: Right;   INTRAOPERATIVE TRANSTHORACIC ECHOCARDIOGRAM N/A 07/27/2023   Procedure: ECHOCARDIOGRAM, TRANSTHORACIC;  Surgeon: Verlin Lonni BIRCH, MD;  Location: [facility];  Service: Cardiovascular;  Laterality: N/A;   LEFT HEART CATH AND CORONARY ANGIOGRAPHY N/A 07/14/2023   Procedure: LEFT HEART CATH AND CORONARY ANGIOGRAPHY;  Surgeon: Wendel Lurena POUR, MD;  Location: [facility];  Service: Cardiovascular;  Laterality: N/A;   LEFT HEART CATH AND CORONARY ANGIOGRAPHY N/A 10/21/2023   Procedure: LEFT HEART CATH AND CORONARY ANGIOGRAPHY;  Surgeon: Wendel Lurena POUR, MD;  Location: [facility];  Service: Cardiovascular;  Laterality: N/A;   LUMBAR PERCUTANEOUS PEDICLE SCREW 3 LEVEL N/A 05/10/2014   Procedure: Stage 2: Percutaneous Pedicle Screws; Laminectomy L3-4  ;  Surgeon: Fairy Levels, MD;  Location: [facility];  Service: Neurosurgery;  Laterality: N/A;  Stage 2: Percutaneous Pedicle Screws T10 to Ileum; Laminectomy L3-4     PACEMAKER LEADLESS INSERTION N/A 07/29/2023   Procedure: PACEMAKER LEADLESS INSERTION;  Surgeon: Nancey Eulas BRAVO, MD;  Location: [facility];  Service: Cardiovascular;  Laterality: N/A;   RECTAL SURGERY     TEMPORARY PACEMAKER N/A 07/23/2023   Procedure: TEMPORARY PACEMAKER;  Surgeon: Verlin Lonni BIRCH, MD;  Location: [facility];  Service: Cardiovascular;  Laterality: N/A;   TUNNELLED CATHETER EXCHANGE N/A 07/22/2023   Procedure: TUNNELLED CATHETER EXCHANGE;  Surgeon: Serene Gaile ORN, MD;  Location: [facility];  Service: Cardiovascular;  Laterality: N/A;   VAGINA SURGERY  2018   Almetta Planas Touch- Vaginal Dryness     Home Medications:  Prior to Admission medications   Medication Sig Start Date End Date Taking? Authorizing Provider  aspirin  EC 81 MG tablet Take 1 tablet (81 mg total) by mouth daily. Swallow whole. 10/24/23   Rosario Leatrice FERNS, MD  betamethasone valerate (VALISONE) 0.1 % cream Apply 1  Application topically as needed (Dermatitis). 02/23/23   [provider]  clopidogrel (PLAVIX) 75 MG tablet Take 1 tablet (75 mg total) by mouth daily. 10/24/23   Rosario Leatrice I, MD  ezetimibe  (ZETIA ) 10 MG tablet Take 10 mg by mouth daily.    [provider]  Folic Acid -Vit B6-Vit B12 (FOLBEE) 2.5-25-1 MG TABS tablet Take 1 tablet by mouth at bedtime.    [provider]  furosemide  (LASIX ) 40 MG tablet Take 1 tablet (40 mg total) by mouth See admin instructions. Take on non-dialysis days. 07/31/23   Goodrich, Callie E, PA-C  levothyroxine  (SYNTHROID ) 100 MCG tablet Take 50-100 mcg by mouth See admin instructions. Take 100mcg (1 tablet) by mouth daily except on Monday and Friday's, take 50mcg (1/2 tablet).    [provider]  midodrine  (PROAMATINE ) 5 MG tablet Take 1 tablet (5 mg total) by mouth 3 (three) times daily with meals. 10/23/23   Rosario Leatrice FERNS, MD  nitroGLYCERIN  (NITROSTAT ) 0.4 MG SL tablet Place 1 tablet (0.4 mg total) under the tongue every 5 (five) minutes as needed for chest pain. 06/01/23 10/21/23  Camnitz, Will Gladis, MD  NOVOLOG  MIX 70/30 FLEXPEN (70-30) 100 UNIT/ML FlexPen Inject 9-15 Units into the skin in the morning and at bedtime. Inject 14-15 units into the skin every morning and 9-10 units at before dinner. According to how many carbohydrates she eats.    [provider]  pantoprazole  (PROTONIX ) 40 MG tablet Take 40 mg by mouth daily as needed (Heartburn). 06/03/23   [provider]  pravastatin (PRAVACHOL) 20 MG tablet Take 1 tablet (20 mg total) by mouth daily at 6 PM. 10/23/23   Rosario Leatrice FERNS, MD    Inpatient Medications: Scheduled Meds:  Continuous Infusions:  PRN Meds:   Allergies:    Allergies  Allergen Reactions   Coffee Flavoring Agent (Non-Screening) Other (See Comments)    Sick on stomach, sneezing, backed up   Azithromycin  Other (See Comments)   Fluogen [Influenza Virus Vaccine]    Molds &  Smuts     Allergy test showed    Pneumococcal Vac Polyvalent Other (See Comments)    Large site reaction    Tobacco Nausea And Vomiting   Versed  [Midazolam ] Other (See Comments)    No memory altering medications. Patient request.   Cephalexin  Other (See Comments)    Yeast infection TOLERATED CEFAZOLIN  PRIOR   Ciprofloxacin Other (See Comments)    tendinitis   Oatmeal Other (See Comments)    Sneezing and drainage   Sulfa Antibiotics Other (See Comments)    Yeast infection    Social History:   Social History   Socioeconomic History   Marital status: Widowed    Spouse name: Not on file   Number of children: 2   Years of education: Not on file   Highest education level: Not on file  Occupational History   Occupation: Retired  Tobacco Use   Smoking status: Never   Smokeless tobacco: Never  Vaping Use   Vaping status: Never Used  Substance and Sexual Activity   Alcohol  use: No   Drug use: Yes    Types: Hydromorphone    Sexual activity: Not on file  Other Topics Concern   Not on file  Social History Narrative   Not on file   Social Drivers of Health   Financial Resource Strain: Not on file  Food Insecurity: No Food Insecurity (07/23/2023)   Hunger Vital Sign    Worried About Running Out of Food in the Last Year: Never true    Ran Out of Food in the Last Year: Never true  Transportation Needs: No Transportation Needs (07/23/2023)   PRAPARE - Administrator, Civil Service (Medical): No    Lack of Transportation (Non-Medical): No  Physical Activity: Not on file  Stress: Not on file  Social Connections: Patient Declined (07/23/2023)   Social Connection and Isolation Panel    Frequency of Communication with Friends and Family: Patient declined    Frequency of Social Gatherings with Friends and Family: Patient declined    Attends Religious Services: Patient declined    Database administrator or Organizations: Patient declined    Attends Banker  Meetings: Patient declined    Marital Status: Patient declined  Intimate Partner Violence: Not At Risk (07/23/2023)   Humiliation, Afraid, Rape, and Kick questionnaire    Fear of Current or Ex-Partner: No    Emotionally Abused: No    Physically Abused: No    Sexually Abused: No    Family History:    Family History  Problem Relation Age of Onset  Multiple myeloma Mother    Cancer Maternal Aunt        Cancer in the mouth - dipped snuff   Heart disease Brother    Diabetes Father    Colon polyps Brother    Heart disease Maternal Uncle    Heart disease Paternal Uncle    Heart disease Paternal Grandfather      ROS:  Please see the history of present illness.  All other ROS reviewed and negative.     Physical Exam/Data:   Vitals:   10/29/23 1113  BP: (!) 106/55  Pulse: 67  Resp: 20  SpO2: 100%   No intake or output data in the 24 hours ending 10/29/23 1417    10/23/2023   11:03 AM 10/23/2023    8:05 AM 10/21/2023    7:28 AM  Last 3 Weights  Weight (lbs) 169 lb 5 oz 173 lb 11.6 oz 173 lb 8 oz  Weight (kg) 76.8 kg 78.8 kg 78.7 kg     There is no height or weight on file to calculate BMI.  General:  Well nourished, well developed, in no acute distress HEENT: normal Neck: no JVD Vascular: No carotid bruits; Distal pulses 2+ bilaterally Dialysis catheter in right subclavian vein Cardiac:  normal S1, S2; RRR; no murmur  Lungs:  clear to auscultation bilaterally, no wheezing, rhonchi or rales  Abd: soft, nontender, no hepatomegaly  Ext: no edema Musculoskeletal:  No deformities, BUE and BLE strength normal and equal Skin: warm and dry  Neuro:  CNs 2-12 intact, no focal abnormalities noted Psych:  Normal affect   EKG:  The EKG was personally reviewed and demonstrates:   EKG 10/29/2023: AV paced rhythm  Telemetry:  Telemetry was personally reviewed and demonstrates:   No significant arrhythmia  Relevant CV Studies: EKG 10/29/2023: Atrial-ventricular dual-paced  rhythm No further analysis attempted due to paced rhythm TWI resolved in anterior leads Confirmed by Charlyn Sora [phone]) on 10/29/2023 12:  Echocardiogram 10/21/2023:  1. Left ventricular ejection fraction, by estimation, is 45 to 50%. Left  ventricular ejection fraction by PLAX is 53 %. The left ventricle has  mildly decreased function. The left ventricle demonstrates regional wall  motion abnormalities (see scoring  diagram/findings for description). There is mild left ventricular  hypertrophy. Left ventricular diastolic parameters are consistent with  Grade I diastolic dysfunction (impaired relaxation). There is moderate  hypokinesis of the left ventricular, apical  apical segment and lateral wall.   2. Right ventricular systolic function is low normal. The right  ventricular size is normal. There is normal pulmonary artery systolic  pressure. The estimated right ventricular systolic pressure is 33.0 mmHg.   3. The mitral valve is degenerative. Mild mitral valve regurgitation.  Moderate to severe mitral annular calcification.   4. The aortic valve has been repaired/replaced. Aortic valve  regurgitation is not visualized. There is a 26 mm Sapien prosthetic (TAVR)  valve present in the aortic position. Aortic valve area, by VTI measures  1.29 cm. Aortic valve mean gradient  measures 4.5 mmHg. Aortic valve Vmax measures 1.42 m/s. Peak gradient 8.1  mmHg, DI 0.64.   5. The inferior vena cava is normal in size with greater than 50%  respiratory variability, suggesting right atrial pressure of 3 mmHg.   Comparison(s): Changes from prior study are noted. 07/28/2023: LVEF 50-55%,  Sapien TAVR - MG 6 mmHg.   Coronary angiogram 10/21/2023:   Mid LAD lesion is 80% stenosed.   Dist LAD-1 lesion is 99% stenosed.  Dist LAD-2 lesion is 80% stenosed.   Prox Cx to Mid Cx lesion is 80% stenosed.   Mid Cx to Dist Cx lesion is 90% stenosed.   1st Diag lesion is 100% stenosed.   Prox RCA  lesion is 30% stenosed.   1.  Relatively unchanged disease when compared to her previous study with diffuse disease of the mid to proximal LAD with high-grade serial stenoses.  The caliber of the LAD is very small.  Extensive stenting would need to be required with questionable durability given small vessel size. 2.  Progression of distal left circumflex lesion; again this is a small artery and should be treated medically. 3.  Mild RCA disease. 4.  LVEDP of 26 mmHg.   Summary: Given very small caliber vessels with diffuse disease and the need for extensive stenting particularly of the LAD which would be associated with uncertain durability, medical therapy should be pursued.  Will start DAPT with Plavix for medical treatment of acute coronary syndrome.  Discontinue heparin  drip.  4 hours bedrest given Mynx closure.    Laboratory Data:  High Sensitivity Troponin:   Recent Labs  Lab 10/21/23 0750 10/21/23 1000 10/21/23 1139 10/21/23 1356 10/29/23 1242  TROPONINIHS 600* 1,746* 6,361* 15,653* 91*     Chemistry Recent Labs  Lab 10/22/23 1854 10/23/23 0815 10/29/23 1242  NA 135 135 138  K 3.9 3.7 3.7  CL 100 99 106  CO2 25 24 21*  GLUCOSE 222* 277* 198*  BUN 17 24* 26*  CREATININE 3.17* 4.13* 3.52*  CALCIUM  8.7* 8.6* 8.5*  GFRNONAA 14* 10* 13*  ANIONGAP 10 12 11     Recent Labs  Lab 10/23/23 0815  ALBUMIN  3.0*   Lipids No results for input(s): CHOL, TRIG, HDL, LABVLDL, LDLCALC, CHOLHDL in the last 168 hours.  Hematology Recent Labs  Lab 10/23/23 0815 10/29/23 1242  WBC 6.1 6.4  RBC 3.28* 3.01*  HGB 9.4* 8.7*  HCT 29.9* 29.1*  MCV 91.2 96.7  MCH 28.7 28.9  MCHC 31.4 29.9*  RDW 18.4* 19.3*  PLT 218 209   Thyroid  No results for input(s): TSH, FREET4 in the last 168 hours.  BNPNo results for input(s): BNP, PROBNP in the last 168 hours.  DDimer No results for input(s): DDIMER in the last 168 hours.   Radiology/Studies:  DG Chest 2  View Result Date: 10/29/2023 CLINICAL DATA:  Mid chest pain and shortness of breath. EXAM: CHEST - 2 VIEW COMPARISON:  October 21, 2023 FINDINGS: There is stable right-sided venous catheter positioning. The heart size and mediastinal contours are within normal limits. There is an artificial aortic valve. A radiopaque loop recorder device is noted. Low lung volumes are noted with mild, stable elevation of the right hemidiaphragm. Both lungs are clear. Postoperative changes are present within the lower thoracic and upper lumbar spine. IMPRESSION: Low lung volumes without active cardiopulmonary disease. Electronically Signed   By: Suzen Dials M.D.   On: 10/29/2023 12:29     Assessment and Plan:   78 y/o female w/prior hypertension but recent hypotension, hyperlipidemia, type 2 DM, CAD, s/p TAVR, ESRD on HD, recent medically treated NSTEMI due to diffuse disease in LAD, back with chest pain.  Unstable angina: Likely continuation of recent NSTEMI.  On no antianginal therapy at baseline due to low blood pressures previously. I suspect her troponin will be elevated.  However, as long as it is downtrending from her recent NSTEMI troponin, I do not think this is a new ACS event. LAD  is not amenable to revascularization. Recommend adding metoprolol  succinate 50 mg daily, and amlodipine  2.5 mg daily. Could consider holding amlodipine  on the days of dialysis, if blood pressure tends to run lower during dialysis sessions. Continue aspirin , Plavix, statin. She already has follow-up scheduled   Risk Assessment/Risk Scores:     TIMI Risk Score for Unstable Angina or Non-ST Elevation MI:   The patient's TIMI risk score is 6, which indicates a 41% risk of all cause mortality, new or recurrent myocardial infarction or need for urgent revascularization in the next 14 days.      [facility] will sign off.   The patient is ready for discharge today, if troponin is down trending from last week.  from a cardiac standpoint. Medication Recommendations:  Add metoprolol  succinate 50 mg daily, amlodipine  2.5 mg daily. Other recommendations (labs, testing, etc): None Follow up as an outpatient: Scheduled on 11/01/2023 with Dr. Cleotilde, and 11/09/2023 with Fate, NP  For questions or updates, please contact [facility] Please consult [email or web address] for contact info under    Signed, Newman JINNY Lawrence, MD  10/29/2023 2:17 PM

## 2023-10-29 NOTE — Discharge Instructions (Signed)
 Our cardiologist has recommended that you begin taking amlodipine  2.5 mg once daily and metoprolol  50 mg once daily. I recommend that you take the metoprolol  at bedtime and the amlodipine  for Rhonda Clayton in the morning.  Please follow-up with your cardiologist.  Continue taking your aspirin .  Continue taking your other medications.  Return to the emergency room for any new or concerning symptoms.  Thank you for coming to the emergency room today it is not unreasonable to come to the emergency room if you are having chest pain difficulty breathing you have known cardiac disease and we would like to be very careful and continue monitoring this.

## 2023-10-29 NOTE — ED Triage Notes (Signed)
 Pt BIB EMS for chest pain, took 3 nitroglycerin  and 324 ASA before EMS arrival with relief. Had breakthrough pain with EMS and given 1 nitroglycerin  that made pain go away. Hx of attempted cath 1 week ago, were unable to cath due to blockages and small arteries and weak vessels per pt.  Sent home on Plavix. Hx of aortic valve replacement. Pacemaker. Dialysis patient MWF, supposed to have dialysis today at 1000. Has not take thinner this AM.

## 2023-10-29 NOTE — ED Notes (Signed)
 Called [facility]

## 2023-11-01 ENCOUNTER — Encounter: Payer: Self-pay | Admitting: Cardiovascular Disease

## 2023-11-01 ENCOUNTER — Ambulatory Visit: Attending: Cardiovascular Disease | Admitting: Cardiovascular Disease

## 2023-11-01 VITALS — BP 132/79 | HR 92 | Ht 61.0 in | Wt 168.0 lb

## 2023-11-01 DIAGNOSIS — Z992 Dependence on renal dialysis: Secondary | ICD-10-CM | POA: Diagnosis not present

## 2023-11-01 DIAGNOSIS — I495 Sick sinus syndrome: Secondary | ICD-10-CM | POA: Diagnosis not present

## 2023-11-01 DIAGNOSIS — R001 Bradycardia, unspecified: Secondary | ICD-10-CM

## 2023-11-01 DIAGNOSIS — R9431 Abnormal electrocardiogram [ECG] [EKG]: Secondary | ICD-10-CM

## 2023-11-01 DIAGNOSIS — D500 Iron deficiency anemia secondary to blood loss (chronic): Secondary | ICD-10-CM | POA: Diagnosis not present

## 2023-11-01 DIAGNOSIS — T8249XA Other complication of vascular dialysis catheter, initial encounter: Secondary | ICD-10-CM | POA: Diagnosis not present

## 2023-11-01 DIAGNOSIS — D688 Other specified coagulation defects: Secondary | ICD-10-CM | POA: Diagnosis not present

## 2023-11-01 DIAGNOSIS — I453 Trifascicular block: Secondary | ICD-10-CM

## 2023-11-01 DIAGNOSIS — E876 Hypokalemia: Secondary | ICD-10-CM | POA: Diagnosis not present

## 2023-11-01 DIAGNOSIS — N2581 Secondary hyperparathyroidism of renal origin: Secondary | ICD-10-CM | POA: Diagnosis not present

## 2023-11-01 DIAGNOSIS — D631 Anemia in chronic kidney disease: Secondary | ICD-10-CM | POA: Diagnosis not present

## 2023-11-01 NOTE — Patient Instructions (Addendum)
 Medication Instructions:  Your physician recommends that you continue on your current medications as directed. Please refer to the Current Medication list given to you today.  *If you need a refill on your cardiac medications before your next appointment, please call your pharmacy*  Lab Work: None ordered.  If you have labs (blood work) drawn today and your tests are completely normal, you will receive your results only by: MyChart Message (if you have MyChart) OR A paper copy in the mail If you have any lab test that is abnormal or we need to change your treatment, we will call you to review the results.  Testing/Procedures: None ordered.   Follow-Up: At [facility], you and your health needs are our priority.  As part of our continuing mission to provide you with exceptional heart care, our providers are all part of one team.  This team includes your primary Cardiologist (physician) and Advanced Practice Providers or APPs (Physician Assistants and Nurse Practitioners) who all work together to provide you with the care you need, when you need it.  Your next appointment:   6 months with Dr Nancey     [facility]: [phone]

## 2023-11-01 NOTE — Progress Notes (Unsigned)
 Cardiology Office Note:   Date:  11/04/2023  ID:  Rhonda Clayton, DOB 1945/12/12, MRN [id number] PCP:  Avva, Ravisankar, MD  [facility] Providers Cardiologist:  Wendel Haws, MD Referring MD: Avva, Ravisankar, MD  Chief Complaint/Reason for Referral: Bradycardia ASSESSMENT:    1. S/P TAVR (transcatheter aortic valve replacement)   2. Coronary artery disease involving native coronary artery of native heart without angina pectoris   3. Type 2 diabetes mellitus with complication, with long-term current use of insulin  (HCC)   4. Hyperlipidemia associated with type 2 diabetes mellitus (HCC)   5. Aortic atherosclerosis   6. Hypertension associated with diabetes (HCC)   7. End stage renal disease (HCC)   8. Pacemaker        PLAN:   In order of problems listed above: Aortic stenosis:   Improved EF and well functioning valve on TTE earlier this month.  Continue ASA 81. CAD:  No revascularization options with PCI.  Continue ASA 81, plavix 75 (DAPT for 1 year for medical treatment of ACS October 2025), pravastatin 20.  Unfortunately we will have difficulties treating with Imdur and beta-blocker given blood pressure issues.  Will need to hold on these given her blood pressure swings.  She is not a candidate for Ranexa.  Fortunately her symptoms seem to be controlled with as needed nitroglycerin . Type 2 diabetes mellitus: Continue ASA 81 mg, pravastatin 20mg .  Defer ARB and Jardiance for now given ESRD. Hyperlipidemia: LDL 97 this month; continue Zetia  10 mg and pravastatin 20.  Patient met with pharmacy about nonstatin options and she was not interested.  Will not pursue intensive lipid lowering in this patient. Aortic atherosclerosis: Continue ASA 81 mg and pravatatin 20 mg. Hypertension: Blood pressure is well-controlled today.  I think her taking amlodipine  2.5 mg if her blood pressure is elevated above 150 mmHg is a good idea.  She will take midodrine  if her blood pressure is low under  100 mmHg and she is symptomatic.  We will stop metoprolol . End-stage renal disease: Currently on hemodialysis but develops low blood pressures at times during dialysis sessions.  There are discussions about placing permanent fistula or peritoneal dialysis access.  I told the patient I would wait a few months maybe until December or January which would give her a few months after her heart attack and make sure she is angina free before proceeding with this procedure under general anesthesia. Chronic systolic heart failure: Improved EF after TAVR.   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   Dispo:  Return in about 3 months (around 02/04/2024).      Medication Adjustments/Labs and Tests Ordered: Current medicines are reviewed at length with the patient today.  Concerns regarding medicines are outlined above.  The following changes have been made:  no change   Labs/tests ordered: No orders of the defined types were placed in this encounter.   Medication Changes: No orders of the defined types were placed in this encounter.   Current medicines are reviewed at length with the patient today.  The patient does not have concerns regarding medicines.     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  At that visit her amlodipine  was decreased to 2.5 mg given this heart rate issue.  She was referred for further recommendations.  The patient is EKG today demonstrates sinus rhythm with bifascicular block.  She tells me that she feels pretty fatigued and out of energy at many times.  She has been getting over pneumonia that she contracted in November.  She still feels a chest burning at times.  Sometimes this is associated with exertion.  She occasionally feels chest fluttering.  Fortunately she has not developed any presyncope or syncope.  She has had no severe bleeding or bruising.  She is tolerating her medications well without any issues.  She does have evidence of aortic atherosclerosis on a prior CT scan and when asked about potentially starting a statin the patient is somewhat reluctant due to the potential brain fog.  Plan: Start atorvastatin  20 mg, obtain monitor and echocardiogram, start Plavix 75 mg refer to pharmacy for GLP-1 receptor agonist therapy, obtain PET stress test to evaluate for ischemia.  March 2025:  Patient consents to use of AI scribe. Her monitor was also significant for pauses during waking hours and she was referred to EP.  Echocardiogram demonstrated moderate to severe aortic stenosis as well.  She is here to discuss those findings due to ongoing dyspnea.    She  experiences fatigue and shortness of breath, which have progressively impacted her daily activities. She used to walk two miles but can no longer do so. Tasks such as vacuuming and pulling trash cans are difficult, sometimes causing a burning sensation in her chest, though she does not describe this as pain. No chest pain, lightheadedness, or swelling in her legs.  She reports poor sleep quality at night, often waking up frequently, which leads her to nap during the day. Her heart rate was noted to be low on March 2nd, when she felt particularly low energy.  Plan: Obtain PET stress test.  June 2025:  Patient consents to use of AI scribe. In the interim the patient's PET stress test demonstrated high risk findings.  She contacted our office with more shortness of breath and was admitted on May 24.  Her BNP was elevated, her creatinine was 3.2, and her troponins were about 500.  Her chest x-ray demonstrated pulmonary edema.  She was given IV Lasix .  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  She ended up having a tunneled dialysis catheter placed and dialysis was initiated.  She did see  EP earlier this month due to pauses seen on a monitor.  These seem to occur when she was awake.  A pacemaker was considered but was deferred at that time given her underlying renal disease and other comorbidities.  She has since started dialysis, attending sessions four days a week. During dialysis, her blood pressure occasionally drops, necessitating temporary cessation of the session. Despite this, she feels better overall, with improved breathing and mobility. She describes a sensation of 'drowning' during the stress test and subsequent difficulty breathing, which has improved since fluid removal during dialysis. She can now lay flat during tests, although she prefers not to at home. She occasionally experiences chest discomfort,  which she attributes to indigestion, particularly after consuming certain foods. No chest discomfort during walking or any blacking out spells since her hospital discharge. Swelling in her legs has improved.  On days she takes Lasix , she experiences a sensation of vision changes and heat rising up the back of her neck. She takes 40 mg of Lasix  on non-dialysis days. She previously tried torsemide but could not tolerate it due to adverse effects.  She is currently staying with her sister but wants to live independently in her own home.  Plan: Refer to TAVR evaluation.  October 2025:  Patient consents to use of AI scribe. 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  She presented in October with an acute coronary syndrome.  Coronary angiography demonstrated unchanged disease without PCI options and she was treated medically.  Since discharge, she presented again with fluctuating blood pressures.  She has developed hypotension with HD and was started on midodrine .  She is here to discuss further.  She is currently managing her condition with medications, including aspirin  and Plavix.  She reports fluctuating blood pressure levels and is on a complex medication regimen. She takes amlodipine  2.5 mg on days when her blood pressure is over 150 mmHg and she is not undergoing dialysis. On dialysis days, she avoids taking amlodipine  due to the risk of low blood pressure. She also uses midodrine  to raise her blood pressure when necessary. She is confused about managing these medications but is following a regimen that seems to work for her.  She is currently taking Protonix  daily to prevent stomach issues due to her aspirin  and Plavix use. She has not experienced bleeding ulcers.  She experiences chest pain occasionally, particularly during dialysis, and uses nitroglycerin  to relieve it. Nitroglycerin  effectively alleviates her  chest pain, which occurs at the beginning of dialysis sessions.  She is considering moving to [place]  to be closer to her son and his wife, who are concerned about her health and ability to travel. She is seeking information about cardiology care in the [facility] area.     Current Medications: Current Meds  Medication Sig   aspirin  EC 81 MG tablet Take 1 tablet (81 mg total) by mouth daily. Swallow whole.   betamethasone valerate (VALISONE) 0.1 % cream Apply 1 Application topically as needed (Dermatitis).   clopidogrel (PLAVIX) 75 MG tablet Take 1 tablet (75 mg total) by mouth daily.   ezetimibe  (ZETIA ) 10 MG tablet Take 10 mg by mouth daily.   Folic Acid -Vit B6-Vit B12 (FOLBEE) 2.5-25-1 MG TABS tablet Take 1 tablet by mouth at bedtime.   furosemide  (LASIX ) 40 MG tablet Take 1 tablet (40 mg total) by mouth See admin instructions. Take on non-dialysis days.   levothyroxine  (SYNTHROID ) 100 MCG  tablet Take 50-100 mcg by mouth See admin instructions. Take 100mcg (1 tablet) by mouth daily except on Monday and Friday's, take 50mcg (1/2 tablet).   nitroGLYCERIN  (NITROSTAT ) 0.4 MG SL tablet Place 1 tablet (0.4 mg total) under the tongue every 5 (five) minutes as needed for chest pain.   NOVOLOG  MIX 70/30 FLEXPEN (70-30) 100 UNIT/ML FlexPen Inject 9-15 Units into the skin in the morning and at bedtime. Inject 14-15 units into the skin every morning and 9-10 units at before dinner. According to how many carbohydrates she eats.   pantoprazole  (PROTONIX ) 40 MG tablet Take 40 mg by mouth daily as needed (Heartburn). (Patient taking differently: Take 40 mg by mouth daily.)   pravastatin (PRAVACHOL) 20 MG tablet Take 1 tablet (20 mg total) by mouth daily at 6 PM.     Review of Systems:   Please see the history of present illness.    All other systems reviewed and are negative.     EKGs/Labs/Other Test Reviewed:   EKG: 2025 A-V sequential pacing  EKG Interpretation Date/Time:    Ventricular Rate:    PR  Interval:    QRS Duration:    QT Interval:    QTC Calculation:   R Axis:      Text Interpretation:           Risk Assessment/Calculations:          Physical Exam:   VS:  BP (!) 124/54   Pulse 67   Ht 5' 1 (1.549 m)   Wt 168 lb 9.6 oz (76.5 kg)   SpO2 98%   BMI 31.86 kg/m        Wt Readings from Last 3 Encounters:  11/04/23 168 lb 9.6 oz (76.5 kg)  11/01/23 168 lb (76.2 kg)  10/23/23 169 lb 5 oz (76.8 kg)      GENERAL:  No apparent distress, AOx3 HEENT:  No carotid bruits, +2 carotid impulses, no scleral icterus CAR: RRR 2/6 SEM, gallops, rubs, or thrills RES:  Clear to auscultation bilaterally ABD:  Soft, nontender, nondistended, positive bowel sounds x 4 VASC:  +2 radial pulses, +2 carotid pulses NEURO:  CN 2-12 grossly intact; motor and sensory grossly intact PSYCH:  No active depression or anxiety EXT:  No edema, ecchymosis, or cyanosis  Signed, Fredrick Dray K Khalis Hittle, MD  11/04/2023 3:20 PM    [facility] [address] [place], [place], [place]  [address] Phone: [phone]; Fax: [phone]   Note:  This document was prepared using Dragon voice recognition software and may include unintentional dictation errors.

## 2023-11-01 NOTE — Progress Notes (Signed)
 Electrophysiology Office Note:    Date:  11/01/2023   ID:  NERIDA BOIVIN, DOB [birth year], MRN [id number]  PCP:  [name], MD   [facility] Providers Cardiologist:  Lurena MARLA Red, MD Electrophysiologist:  Soyla Gladis Norton, MD  Structural Heart:  [name], MD    Referring MD: [name], MD   History of Present Illness:    Rhonda Clayton is a 78 y.o. female with a medical history significant for sick sinus syndrome, aortic stenosis, stage IV CKD, atherosclerotic vascular disease referred for leadless pacemaker placement.     She saw Dr. Norton with sinus bradycardia and occasional pauses. The pauses occurred in the AM, usually between 7 and 9 AM.  She reports that she is usually awake at these hours.  She notes 1 episode of fatigue associated with heart rates of 40 bpm.  Unfortunately she did not notate the time on her diary, so we do not know the exact rhythm at the time of her event.  She was hospitalized in late May 2025 with acute on chronic heart failure.  Dialysis was initiated for volume management.  During the hospitalization she did not appear to be bradycardic.      Today, she reports that she is at baseline.  She has occasional fatigue.  No syncope, no presyncope. She has had recent admission for NSTEMI.  EKGs/Labs/Other Studies Reviewed Today:     Echocardiogram:  TTE 04/08/2023 EF 55-60%.  Grade 2 diastolic dysfunction.  Left atrium is mildly dilated. Degenerative mitral valve, no mitral stenosis.  Severe calcification of the aortic valve with severe low-flow low gradient aortic valve stenosis.. Left atrium is mildly dilated.   Monitors:  11 day monitor March, 2025  -- my interpretation Sinus rhythm, HR 33-90, avg 60 1.1% burden of atrial ectopy Rare ventricular ectopy, no episodes of bigeminy Multiple sinus pauses occurred, the longer was 4.7 seconds. These occurred in the AM and over night, as late as about 9 AM or  so.  There was a symptom episode of fatigue associated with hr in the 40's (per patient), but we do not have associated strips because the date/time of the symptom was not specified.   EKG:   EKG Interpretation Date/Time:  Monday November 01 2023 16:13:25 EDT Ventricular Rate:  92 PR Interval:  256 QRS Duration:  154 QT Interval:  436 QTC Calculation: 539 R Axis:   -58  Text Interpretation: Atrial-paced rhythm with prolonged AV conduction Right bundle branch block Left anterior fascicular block Bifascicular block Left ventricular hypertrophy with repolarization abnormality ( R in aVL , Romhilt-Estes ) Cannot rule out Septal infarct , age undetermined When compared with ECG of 29-Oct-2023 11:21,  Atrial pacing rate has increased Confirmed by Nancey Scotts [phone]) on 11/01/2023 4:26:11 PM     Physical Exam:    VS:  BP 132/79 (BP Location: Right Arm, Patient Position: Sitting, Cuff Size: Normal)   Pulse 92   Ht 5' 1 (1.549 m)   Wt 168 lb (76.2 kg)   SpO2 98%   BMI 31.74 kg/m     Wt Readings from Last 3 Encounters:  11/01/23 168 lb (76.2 kg)  10/23/23 169 lb 5 oz (76.8 kg)  08/06/23 174 lb 8 oz (79.2 kg)     GEN: Well nourished, well developed in no acute distress CARDIAC: RRR, no murmurs, rubs, gallops RESPIRATORY:  Normal work of breathing MUSCULOSKELETAL: no edema    ASSESSMENT & PLAN:     Bradycardia She  has had both sinus pauses, which occurred in the a.m. No symptoms were documented at this time.  She has had episodes of low pulse rates associated with fatigue. Dual chamber leadless pacemaker was placed in July 2025 I reviewed today's device interrogation in detail. See paceart for report She is pacing-dependent in the atrium. She has a junctional escape rhythm I will decrease her rate response today  Right bundle branch block With stable AV conduction, rare V pacing Can set RV pacer VVI for prolonged battery life  ESRD, on dialysis Followed by  nephrology  Hypertension BP ok today Requiring midodrine  for dialysis      Signed, Eulas FORBES Furbish, MD  11/01/2023 4:38 PM    [facility]

## 2023-11-02 LAB — CUP PACEART INCLINIC DEVICE CHECK
Date Time Interrogation Session: 20251014072811
Implantable Pulse Generator Implant Date: 20250710
Pulse Gen Serial Number: 1361992

## 2023-11-03 NOTE — Telephone Encounter (Signed)
 Patient has been seen.

## 2023-11-04 ENCOUNTER — Ambulatory Visit: Attending: Internal Medicine | Admitting: Internal Medicine

## 2023-11-04 ENCOUNTER — Encounter: Payer: Self-pay | Admitting: Internal Medicine

## 2023-11-04 VITALS — BP 124/54 | HR 67 | Ht 61.0 in | Wt 168.6 lb

## 2023-11-04 DIAGNOSIS — I152 Hypertension secondary to endocrine disorders: Secondary | ICD-10-CM | POA: Diagnosis not present

## 2023-11-04 DIAGNOSIS — E1159 Type 2 diabetes mellitus with other circulatory complications: Secondary | ICD-10-CM

## 2023-11-04 DIAGNOSIS — I700 Atherosclerosis of aorta: Secondary | ICD-10-CM

## 2023-11-04 DIAGNOSIS — Z952 Presence of prosthetic heart valve: Secondary | ICD-10-CM | POA: Diagnosis not present

## 2023-11-04 DIAGNOSIS — E1169 Type 2 diabetes mellitus with other specified complication: Secondary | ICD-10-CM

## 2023-11-04 DIAGNOSIS — E118 Type 2 diabetes mellitus with unspecified complications: Secondary | ICD-10-CM

## 2023-11-04 DIAGNOSIS — I2510 Atherosclerotic heart disease of native coronary artery without angina pectoris: Secondary | ICD-10-CM | POA: Diagnosis not present

## 2023-11-04 DIAGNOSIS — Z950 Presence of cardiac pacemaker: Secondary | ICD-10-CM | POA: Diagnosis not present

## 2023-11-04 DIAGNOSIS — N186 End stage renal disease: Secondary | ICD-10-CM

## 2023-11-04 DIAGNOSIS — E785 Hyperlipidemia, unspecified: Secondary | ICD-10-CM | POA: Diagnosis not present

## 2023-11-04 DIAGNOSIS — Z794 Long term (current) use of insulin: Secondary | ICD-10-CM

## 2023-11-04 NOTE — Patient Instructions (Signed)
 Medication Instructions:  STOP Metoprolol  Succinate (Toprol -XL)  CHANGE Amlodipine  to only take if systolic blood pressure (top number) is over 150 mmHg  *If you need a refill on your cardiac medications before your next appointment, please call your pharmacy*  Lab Work: None ordered today. If you have labs (blood work) drawn today and your tests are completely normal, you will receive your results only by: MyChart Message (if you have MyChart) OR A paper copy in the mail If you have any lab test that is abnormal or we need to change your treatment, we will call you to review the results.  Testing/Procedures: None ordered today.  Follow-Up: At [facility], you and your health needs are our priority.  As part of our continuing mission to provide you with exceptional heart care, our providers are all part of one team.  This team includes your primary Cardiologist (physician) and Advanced Practice Providers or APPs (Physician Assistants and Nurse Practitioners) who all work together to provide you with the care you need, when you need it.  Your next appointment:   3 month(s)  Provider:   Arun Thukkani, MD

## 2023-11-05 ENCOUNTER — Ambulatory Visit: Admitting: Physician Assistant

## 2023-11-05 ENCOUNTER — Ambulatory Visit: Payer: Self-pay | Admitting: Cardiovascular Disease

## 2023-11-08 DIAGNOSIS — D688 Other specified coagulation defects: Secondary | ICD-10-CM | POA: Diagnosis not present

## 2023-11-08 DIAGNOSIS — T8249XA Other complication of vascular dialysis catheter, initial encounter: Secondary | ICD-10-CM | POA: Diagnosis not present

## 2023-11-08 DIAGNOSIS — E876 Hypokalemia: Secondary | ICD-10-CM | POA: Diagnosis not present

## 2023-11-08 DIAGNOSIS — N186 End stage renal disease: Secondary | ICD-10-CM | POA: Diagnosis not present

## 2023-11-08 DIAGNOSIS — Z992 Dependence on renal dialysis: Secondary | ICD-10-CM | POA: Diagnosis not present

## 2023-11-08 DIAGNOSIS — D500 Iron deficiency anemia secondary to blood loss (chronic): Secondary | ICD-10-CM | POA: Diagnosis not present

## 2023-11-08 DIAGNOSIS — N2581 Secondary hyperparathyroidism of renal origin: Secondary | ICD-10-CM | POA: Diagnosis not present

## 2023-11-09 ENCOUNTER — Ambulatory Visit
Admission: RE | Admit: 2023-11-09 | Discharge: 2023-11-09 | Disposition: A | Discharge status: Home / Self Care | Source: Ambulatory Visit | Attending: Cardiology | Admitting: Cardiology

## 2023-11-09 ENCOUNTER — Ambulatory Visit: Admitting: Physician Assistant

## 2023-11-09 DIAGNOSIS — Z952 Presence of prosthetic heart valve: Secondary | ICD-10-CM | POA: Insufficient documentation

## 2023-11-09 LAB — ECHOCARDIOGRAM COMPLETE
AR max vel: 1.88 cm2
AV Area VTI: 1.94 cm2
AV Area mean vel: 1.86 cm2
AV Mean grad: 4.5 mmHg
AV Peak grad: 8.1 mmHg
Ao pk vel: 1.43 m/s
Area-P 1/2: 3.99 cm2
Calc EF: 50.2 %
MV VTI: 1.51 cm2
P 1/2 time: 436 ms
S' Lateral: 2.3 cm
Single Plane A2C EF: 44.6 %
Single Plane A4C EF: 56 %

## 2023-11-10 ENCOUNTER — Ambulatory Visit: Payer: Self-pay | Admitting: Physician Assistant

## 2023-11-15 DIAGNOSIS — T8249XA Other complication of vascular dialysis catheter, initial encounter: Secondary | ICD-10-CM | POA: Diagnosis not present

## 2023-11-15 DIAGNOSIS — N2581 Secondary hyperparathyroidism of renal origin: Secondary | ICD-10-CM | POA: Diagnosis not present

## 2023-11-15 DIAGNOSIS — E876 Hypokalemia: Secondary | ICD-10-CM | POA: Diagnosis not present

## 2023-11-15 DIAGNOSIS — D500 Iron deficiency anemia secondary to blood loss (chronic): Secondary | ICD-10-CM | POA: Diagnosis not present

## 2023-11-15 DIAGNOSIS — D688 Other specified coagulation defects: Secondary | ICD-10-CM | POA: Diagnosis not present

## 2023-11-15 DIAGNOSIS — N186 End stage renal disease: Secondary | ICD-10-CM | POA: Diagnosis not present

## 2023-11-15 DIAGNOSIS — Z992 Dependence on renal dialysis: Secondary | ICD-10-CM | POA: Diagnosis not present

## 2023-11-16 DIAGNOSIS — N186 End stage renal disease: Secondary | ICD-10-CM | POA: Diagnosis not present

## 2023-11-16 DIAGNOSIS — I5042 Chronic combined systolic (congestive) and diastolic (congestive) heart failure: Secondary | ICD-10-CM | POA: Diagnosis not present

## 2023-11-16 DIAGNOSIS — H35069 Retinal vasculitis, unspecified eye: Secondary | ICD-10-CM | POA: Diagnosis not present

## 2023-11-16 DIAGNOSIS — I25118 Atherosclerotic heart disease of native coronary artery with other forms of angina pectoris: Secondary | ICD-10-CM | POA: Diagnosis not present

## 2023-11-16 DIAGNOSIS — E039 Hypothyroidism, unspecified: Secondary | ICD-10-CM | POA: Diagnosis not present

## 2023-11-16 DIAGNOSIS — E1122 Type 2 diabetes mellitus with diabetic chronic kidney disease: Secondary | ICD-10-CM | POA: Diagnosis not present

## 2023-11-16 DIAGNOSIS — Z950 Presence of cardiac pacemaker: Secondary | ICD-10-CM | POA: Diagnosis not present

## 2023-11-16 DIAGNOSIS — E785 Hyperlipidemia, unspecified: Secondary | ICD-10-CM | POA: Diagnosis not present

## 2023-11-16 DIAGNOSIS — E113553 Type 2 diabetes mellitus with stable proliferative diabetic retinopathy, bilateral: Secondary | ICD-10-CM | POA: Diagnosis not present

## 2023-11-16 DIAGNOSIS — I214 Non-ST elevation (NSTEMI) myocardial infarction: Secondary | ICD-10-CM | POA: Diagnosis not present

## 2023-11-16 DIAGNOSIS — Z992 Dependence on renal dialysis: Secondary | ICD-10-CM | POA: Diagnosis not present

## 2023-11-19 DIAGNOSIS — E1122 Type 2 diabetes mellitus with diabetic chronic kidney disease: Secondary | ICD-10-CM | POA: Diagnosis not present

## 2023-11-19 DIAGNOSIS — N186 End stage renal disease: Secondary | ICD-10-CM | POA: Diagnosis not present

## 2023-11-19 DIAGNOSIS — Z992 Dependence on renal dialysis: Secondary | ICD-10-CM | POA: Diagnosis not present

## 2023-11-22 ENCOUNTER — Telehealth: Payer: Self-pay | Admitting: Internal Medicine

## 2023-11-22 DIAGNOSIS — E785 Hyperlipidemia, unspecified: Secondary | ICD-10-CM

## 2023-11-22 DIAGNOSIS — N186 End stage renal disease: Secondary | ICD-10-CM | POA: Diagnosis not present

## 2023-11-22 DIAGNOSIS — Z992 Dependence on renal dialysis: Secondary | ICD-10-CM | POA: Diagnosis not present

## 2023-11-22 DIAGNOSIS — N2581 Secondary hyperparathyroidism of renal origin: Secondary | ICD-10-CM | POA: Diagnosis not present

## 2023-11-22 DIAGNOSIS — D688 Other specified coagulation defects: Secondary | ICD-10-CM | POA: Diagnosis not present

## 2023-11-22 DIAGNOSIS — E876 Hypokalemia: Secondary | ICD-10-CM | POA: Diagnosis not present

## 2023-11-22 DIAGNOSIS — T8249XA Other complication of vascular dialysis catheter, initial encounter: Secondary | ICD-10-CM | POA: Diagnosis not present

## 2023-11-22 NOTE — Telephone Encounter (Signed)
 Pt c/o medication issue:  1. Name of Medication:   pravastatin (PRAVACHOL) 20 MG tablet   2. How are you currently taking this medication (dosage and times per day)?   Patient stated she stopped taking this medication  3. Are you having a reaction (difficulty breathing--STAT)?   4. What is your medication issue?    Patient noted she stopped taking this medication as it was causing her muscle pain.  Patient wants alternate medication.   Patient is also following-up on Echocardiogram test results.  Patient stated can leave voice message on home phone - [phone].

## 2023-11-22 NOTE — Telephone Encounter (Signed)
 Left detailed message for patient informing her we will forward phone note to Dr. Wendel to make him aware she was not able to tolerate pravastatin (20 mg daily) due to muscle pains (added to allergy list and removed from med list). We will follow-up with Dr. Parry recommendations.  Also shared echo results per Lamarr Hummer, PA-C. Keep follow-up as planned.

## 2023-11-23 ENCOUNTER — Telehealth: Payer: Self-pay

## 2023-11-23 MED ORDER — ROSUVASTATIN CALCIUM 5 MG PO TABS: 5.0000 mg | ORAL_TABLET | Freq: Every day | ORAL | 3 refills | Status: AC

## 2023-11-23 NOTE — Telephone Encounter (Signed)
 Spoke with pt about being referred to PharmD for alternatives and she stated if it is anything such as Repatha that lingers in the body for weeks, she is not interested. She stated she would be willing to try another statin such as Crestor. Explained that I will send this to Dr. Wendel and our PharmD team to review.

## 2023-11-23 NOTE — Telephone Encounter (Signed)
 Spoke with pt and explained that Crestor 5 mg was sent to pt pharmacy and repeat labs needs to be completed in 2 months. Pt stated she is moving to [place]  after her appt with Dr. Wendel in December and asked if she could come to have labs completed around the time of her appt. Explained that will be okay and she plans to come earlier in the week before her appt.

## 2023-11-23 NOTE — Telephone Encounter (Signed)
 Patient returned RN's call regarding results.

## 2023-11-29 DIAGNOSIS — I132 Hypertensive heart and chronic kidney disease with heart failure and with stage 5 chronic kidney disease, or end stage renal disease: Secondary | ICD-10-CM | POA: Diagnosis not present

## 2023-11-29 DIAGNOSIS — I2510 Atherosclerotic heart disease of native coronary artery without angina pectoris: Secondary | ICD-10-CM | POA: Diagnosis not present

## 2023-11-29 DIAGNOSIS — N186 End stage renal disease: Secondary | ICD-10-CM | POA: Diagnosis not present

## 2023-11-29 DIAGNOSIS — I455 Other specified heart block: Secondary | ICD-10-CM | POA: Diagnosis not present

## 2023-11-29 DIAGNOSIS — I5042 Chronic combined systolic (congestive) and diastolic (congestive) heart failure: Secondary | ICD-10-CM | POA: Diagnosis not present

## 2023-11-29 DIAGNOSIS — D688 Other specified coagulation defects: Secondary | ICD-10-CM | POA: Diagnosis not present

## 2023-11-29 DIAGNOSIS — T8249XA Other complication of vascular dialysis catheter, initial encounter: Secondary | ICD-10-CM | POA: Diagnosis not present

## 2023-11-29 DIAGNOSIS — Z48812 Encounter for surgical aftercare following surgery on the circulatory system: Secondary | ICD-10-CM | POA: Diagnosis not present

## 2023-11-29 DIAGNOSIS — I340 Nonrheumatic mitral (valve) insufficiency: Secondary | ICD-10-CM | POA: Diagnosis not present

## 2023-11-29 DIAGNOSIS — N2581 Secondary hyperparathyroidism of renal origin: Secondary | ICD-10-CM | POA: Diagnosis not present

## 2023-11-29 DIAGNOSIS — I3481 Nonrheumatic mitral (valve) annulus calcification: Secondary | ICD-10-CM | POA: Diagnosis not present

## 2023-11-29 DIAGNOSIS — I453 Trifascicular block: Secondary | ICD-10-CM | POA: Diagnosis not present

## 2023-11-29 DIAGNOSIS — I214 Non-ST elevation (NSTEMI) myocardial infarction: Secondary | ICD-10-CM | POA: Diagnosis not present

## 2023-11-29 DIAGNOSIS — Z992 Dependence on renal dialysis: Secondary | ICD-10-CM | POA: Diagnosis not present

## 2023-11-29 DIAGNOSIS — E1122 Type 2 diabetes mellitus with diabetic chronic kidney disease: Secondary | ICD-10-CM | POA: Diagnosis not present

## 2023-11-29 DIAGNOSIS — I495 Sick sinus syndrome: Secondary | ICD-10-CM | POA: Diagnosis not present

## 2023-12-06 DIAGNOSIS — N2581 Secondary hyperparathyroidism of renal origin: Secondary | ICD-10-CM | POA: Diagnosis not present

## 2023-12-06 DIAGNOSIS — E876 Hypokalemia: Secondary | ICD-10-CM | POA: Diagnosis not present

## 2023-12-06 DIAGNOSIS — N186 End stage renal disease: Secondary | ICD-10-CM | POA: Diagnosis not present

## 2023-12-06 DIAGNOSIS — Z992 Dependence on renal dialysis: Secondary | ICD-10-CM | POA: Diagnosis not present

## 2023-12-06 DIAGNOSIS — D688 Other specified coagulation defects: Secondary | ICD-10-CM | POA: Diagnosis not present

## 2023-12-06 DIAGNOSIS — T8249XA Other complication of vascular dialysis catheter, initial encounter: Secondary | ICD-10-CM | POA: Diagnosis not present

## 2023-12-12 DIAGNOSIS — T8249XA Other complication of vascular dialysis catheter, initial encounter: Secondary | ICD-10-CM | POA: Diagnosis not present

## 2023-12-12 DIAGNOSIS — E876 Hypokalemia: Secondary | ICD-10-CM | POA: Diagnosis not present

## 2023-12-12 DIAGNOSIS — N2581 Secondary hyperparathyroidism of renal origin: Secondary | ICD-10-CM | POA: Diagnosis not present

## 2023-12-12 DIAGNOSIS — Z992 Dependence on renal dialysis: Secondary | ICD-10-CM | POA: Diagnosis not present

## 2023-12-12 DIAGNOSIS — D688 Other specified coagulation defects: Secondary | ICD-10-CM | POA: Diagnosis not present

## 2023-12-14 ENCOUNTER — Encounter

## 2023-12-19 DIAGNOSIS — Z992 Dependence on renal dialysis: Secondary | ICD-10-CM | POA: Diagnosis not present

## 2023-12-19 DIAGNOSIS — E1122 Type 2 diabetes mellitus with diabetic chronic kidney disease: Secondary | ICD-10-CM | POA: Diagnosis not present

## 2023-12-19 DIAGNOSIS — N186 End stage renal disease: Secondary | ICD-10-CM | POA: Diagnosis not present

## 2023-12-20 DIAGNOSIS — T8249XA Other complication of vascular dialysis catheter, initial encounter: Secondary | ICD-10-CM | POA: Diagnosis not present

## 2023-12-20 DIAGNOSIS — D688 Other specified coagulation defects: Secondary | ICD-10-CM | POA: Diagnosis not present

## 2023-12-20 DIAGNOSIS — Z992 Dependence on renal dialysis: Secondary | ICD-10-CM | POA: Diagnosis not present

## 2023-12-20 DIAGNOSIS — N2581 Secondary hyperparathyroidism of renal origin: Secondary | ICD-10-CM | POA: Diagnosis not present

## 2023-12-20 NOTE — Progress Notes (Signed)
 Cardiology Office Note:   Date:  12/30/2023  ID:  Rhonda Clayton, DOB April 18, 1945, MRN [id number] PCP:  Avva, Ravisankar, MD  [facility] Providers Cardiologist:  Wendel Haws, MD Referring MD: Avva, Ravisankar, MD  Chief Complaint/Reason for Referral: Bradycardia ASSESSMENT:    1. S/P TAVR (transcatheter aortic valve replacement)   2. Coronary artery disease involving native coronary artery of native heart without angina pectoris   3. Type 2 diabetes mellitus with complication, with long-term current use of insulin  (HCC)   4. Hyperlipidemia associated with type 2 diabetes mellitus (HCC)   5. Aortic atherosclerosis   6. Hypertension associated with diabetes (HCC)   7. End stage renal disease (HCC)   8. Chronic combined systolic and diastolic heart failure (HCC)   9. Pacemaker         PLAN:   In order of problems listed above: Aortic stenosis:   Improved EF and well functioning valve on TTE earlier this month.  Continue ASA 81.  I have alerted our team and we will reach out to her in July 2026 to perform a 1 year registry follow-up.  She plans on establishing with cardiology in [place] . CAD:  No revascularization options with PCI.  Continue ASA 81, plavix  75 (DAPT for 1 year for medical treatment of ACS October 2025), pravastatin  20.  Has had low blood pressure issues with Imdur and beta-blocker, not a candidate for Ranexa given end-stage renal disease.  Fortunately her symptoms seem to be controlled with as needed nitroglycerin .  Has had a few episodes of chest pain.  Continue current medical therapy. Type 2 diabetes mellitus: Continue ASA 81 mg, pravastatin  20mg .  Defer ARB and Jardiance for now given ESRD. Hyperlipidemia: LDL 97 this month; continue Zetia  10 mg and pravastatin  20.  Not interested in nonstatin options will not pursue intensive lipid lowering in this patient. Aortic atherosclerosis: Continue ASA 81 mg and pravatatin 20 mg. Hypertension: Continue amlodipine  2.5  mg as needed for blood pressure above 150 mmHg.  Blood pressure is well-controlled today. End-stage renal disease: Currently on hemodialysis but develops low blood pressures at times during dialysis sessions.  Plan is to have new dialysis access placed when she moves to [place] .  See #10 below. Chronic systolic/diastolic heart failure: Improved EF after TAVR.   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  She does have occasional chest pain which is responsive to nitroglycerin .  She has no revascularization options.  However her ejection fraction is normal and she has no high-grade proximal disease.  I think she should pursue fistula placement.  Plavix  can be stopped and resumed afterwards.  She will do this down in [place] .  I spent 45 minutes reviewing all clinical data during and prior to this visit including all relevant imaging studies, laboratories, clinical information from other health systems and prior notes from both Cardiology and other specialties, interviewing the patient, conducting a complete physical examination, and coordinating care in order to formulate a comprehensive and personalized evaluation and treatment plan.   Dispo:  Return if symptoms worsen or fail to improve.      Medication Adjustments/Labs and Tests Ordered: Current medicines are reviewed at length with the patient today.  Concerns regarding medicines are outlined above.  The following changes have been made:  no change   Labs/tests ordered: No orders of the defined types were placed in this  encounter.   Medication Changes: No orders of the defined types were placed in this encounter.   Current medicines are reviewed at length with the patient today.  The patient does not have concerns regarding medicines.     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  At that visit her amlodipine  was decreased to 2.5 mg given this heart rate issue.  She was referred for further recommendations.  The patient is EKG today demonstrates sinus rhythm with bifascicular block.  She tells me that she feels pretty fatigued and out of energy at many times.  She has been getting over pneumonia that she contracted in November.  She still feels a chest burning at times.  Sometimes this is associated with exertion.  She occasionally feels chest fluttering.  Fortunately she has not developed any presyncope or syncope.  She has had no severe bleeding or bruising.  She is tolerating her medications well without any issues.  She does have evidence of aortic atherosclerosis on a prior CT scan and when asked about potentially starting a statin the patient is somewhat reluctant due to the potential brain fog.  Plan: Start atorvastatin  20 mg, obtain monitor and echocardiogram, start Plavix  75 mg refer to pharmacy for GLP-1 receptor agonist therapy, obtain PET stress test to evaluate for ischemia.  March 2025:  Patient consents  to use of AI scribe. Her monitor was also significant for pauses during waking hours and she was referred to EP.  Echocardiogram demonstrated moderate to severe aortic stenosis as well.  She is here to discuss those findings due to ongoing dyspnea.    She experiences fatigue and shortness of breath, which have progressively impacted her daily activities. She used to walk two miles but can no longer do so. Tasks such as vacuuming and pulling trash cans are difficult, sometimes causing a burning sensation in her chest, though she does not describe this as pain. No chest pain, lightheadedness, or swelling in her legs.  She reports poor sleep quality at night, often waking up frequently, which leads her to nap during the day. Her heart rate was noted to be low on March 2nd, when she felt particularly low energy.  Plan: Obtain PET stress test.  June 2025:  Patient consents to use of AI scribe. In the interim the patient's PET stress test demonstrated high risk findings.  She contacted our office with more shortness of breath and was admitted on May 24.  Her BNP was elevated, her creatinine was 3.2, and her troponins were about 500.  Her chest x-ray demonstrated pulmonary edema.  She  was given IV Lasix .  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  She ended up having a tunneled dialysis catheter placed and dialysis was initiated.  She did see EP earlier this month due to pauses seen on a monitor.  These seem to occur when she was awake.  A pacemaker was considered but was deferred at that time given her underlying renal disease and other comorbidities.  She has since started dialysis, attending sessions four days a week. During dialysis, her blood pressure occasionally drops, necessitating temporary cessation of the session. Despite this, she feels better overall, with improved breathing and mobility. She describes a  sensation of 'drowning' during the stress test and subsequent difficulty breathing, which has improved since fluid removal during dialysis. She can now lay flat during tests, although she prefers not to at home. She occasionally experiences chest discomfort, which she attributes to indigestion, particularly after consuming certain foods. No chest discomfort during walking or any blacking out spells since her hospital discharge. Swelling in her legs has improved.  On days she takes Lasix , she experiences a sensation of vision changes and heat rising up the back of her neck. She takes 40 mg of Lasix  on non-dialysis days. She previously tried torsemide but could not tolerate it due to adverse effects.  She is currently staying with her sister but wants to live independently in her own home.  Plan: Refer to TAVR evaluation.  October 2025:  Patient consents to use of AI scribe. 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  She presented in October with an acute coronary syndrome.  Coronary angiography demonstrated unchanged disease without PCI options and she was treated medically.  Since discharge, she presented again with fluctuating blood pressures.  She has developed hypotension with HD and was started on midodrine .  She is here to discuss further.  She is currently managing her condition with medications, including aspirin  and Plavix .  She reports fluctuating blood pressure levels and is on a complex medication regimen. She takes amlodipine  2.5 mg on days when her blood pressure is over 150 mmHg and she is not undergoing dialysis. On dialysis days, she avoids taking amlodipine  due to the risk of low blood pressure. She also uses midodrine  to raise her blood pressure when necessary. She is confused about managing these medications but is following a regimen that seems to work for her.  She is currently taking Protonix  daily  to prevent stomach issues due to her aspirin  and Plavix  use. She has not experienced bleeding ulcers.  She experiences chest pain occasionally, particularly during dialysis, and uses nitroglycerin  to relieve it. Nitroglycerin  effectively alleviates her chest pain, which occurs at the beginning of dialysis sessions.  She is considering moving to [place]  to be closer to her son and his wife, who are concerned about her health and ability to travel. She is seeking information about cardiology care in the [facility] area.  Plan: Stop metoprolol  given low blood pressures.  December 2025:  Patient consents to use of AI scribe. The patient returns for routine follow-up.  She experiences episodes of chest discomfort, describing it as her heart feeling 'heavy' and 'wanting to hurt.' These episodes occurred last night, prompting her to take nitroglycerin , which provided immediate relief and allowed her to return to sleep. She experienced a similar episode upon waking again and took another nitroglycerin   with the same effect. Stress related to her upcoming move and lack of sleep may be contributing factors. No need to call 911 during these episodes.  She has a history of a heart attack in October 2025 and underwent a TAVR procedure in July 2025. She is concerned about her heart health in relation to her upcoming move to [place]  and the potential need for surgery to place a dialysis access.  She is undergoing dialysis and reports that her blood pressure often drops significantly during sessions, necessitating the use of midodrine  to raise it. She mentions feeling more fatigued than usual over the past two weeks, attributing it to stress and the physical demands of dialysis. She has been using a temporary dialysis catheter since May and is considering options for permanent access, either a fistula or peritoneal dialysis access.  She is preparing to move to [place]  to live with her brother and son, which is causing her  significant stress. She has been in her current home since 1970 and is finding the transition emotionally challenging. She is coordinating with her current and future healthcare providers to ensure continuity of care, including setting up appointments with a new cardiologist and other specialists in [place] .  She inquires about the safety of using a grounding mat with her pacemaker, expressing concern about potential electromagnetic interference. She also mentions a recent call regarding a pacemaker update, which she plans to address once settled in [place] .     Current Medications: Current Meds  Medication Sig   aspirin  EC 81 MG tablet Take 1 tablet (81 mg total) by mouth daily. Swallow whole.   betamethasone valerate (VALISONE) 0.1 % cream Apply 1 Application topically as needed (Dermatitis).   clopidogrel  (PLAVIX ) 75 MG tablet Take 1 tablet (75 mg total) by mouth daily.   Folic Acid -Vit B6-Vit B12 (FOLBEE) 2.5-25-1 MG TABS tablet Take 1 tablet by mouth at bedtime.   furosemide  (LASIX ) 40 MG tablet Take 1 tablet (40 mg total) by mouth See admin instructions. Take on non-dialysis days.   levothyroxine  (SYNTHROID ) 100 MCG tablet Take 50-100 mcg by mouth See admin instructions. Take 100mcg (1 tablet) by mouth daily except on Monday and Friday's, take 50mcg (1/2 tablet).   midodrine  (PROAMATINE ) 5 MG tablet Take 1 tablet (5 mg total) by mouth 3 (three) times daily with meals.   nitroGLYCERIN  (NITROSTAT ) 0.4 MG SL tablet Place 1 tablet (0.4 mg total) under the tongue every 5 (five) minutes as needed for chest pain.   NOVOLOG  MIX 70/30 FLEXPEN (70-30) 100 UNIT/ML FlexPen Inject 9-15 Units into the skin in the morning and at bedtime. Inject 14-15 units into the skin every morning and 9-10 units at before dinner. According to how many carbohydrates she eats.   pantoprazole  (PROTONIX ) 40 MG tablet Take 40 mg by mouth daily as needed (Heartburn). (Patient taking differently: Take 40 mg by mouth daily.)      Review of Systems:   Please see the history of present illness.    All other systems reviewed and are negative.     EKGs/Labs/Other Test Reviewed:   EKG: 2025 A-paced, right bundle branch block, left anterior fascicular block  EKG Interpretation Date/Time:    Ventricular Rate:    PR Interval:    QRS Duration:    QT Interval:    QTC Calculation:   R Axis:      Text Interpretation:           Risk Assessment/Calculations:  Physical Exam:   VS:  BP (!) 108/52 (BP Location: Left Arm, Patient Position: Sitting, Cuff Size: Normal)   Pulse 96   Ht 5' 1 (1.549 m)   Wt 171 lb 6.4 oz (77.7 kg)   SpO2 96%   BMI 32.39 kg/m        Wt Readings from Last 3 Encounters:  12/30/23 171 lb 6.4 oz (77.7 kg)  11/04/23 168 lb 9.6 oz (76.5 kg)  11/01/23 168 lb (76.2 kg)      GENERAL:  No apparent distress, AOx3 HEENT:  No carotid bruits, +2 carotid impulses, no scleral icterus CAR: RRR 2/6 SEM, gallops, rubs, or thrills RES:  Clear to auscultation bilaterally ABD:  Soft, nontender, nondistended, positive bowel sounds x 4 VASC:  +2 radial pulses, +2 carotid pulses NEURO:  CN 2-12 grossly intact; motor and sensory grossly intact PSYCH:  No active depression or anxiety EXT:  No edema, ecchymosis, or cyanosis  Signed, Tiffinie Caillier K Onell Mcmath, MD  12/30/2023 11:16 AM    [facility] [address] [place], [place], [place]  [address] Phone: [phone]; Fax: [phone]   Note:  This document was prepared using Dragon voice recognition software and may include unintentional dictation errors.

## 2023-12-28 ENCOUNTER — Telehealth: Payer: Self-pay

## 2023-12-28 DIAGNOSIS — E1110 Type 2 diabetes mellitus with ketoacidosis without coma: Secondary | ICD-10-CM

## 2023-12-28 DIAGNOSIS — E785 Hyperlipidemia, unspecified: Secondary | ICD-10-CM | POA: Diagnosis not present

## 2023-12-28 NOTE — Progress Notes (Signed)
 Complex Care Management Note  Care Guide Note 12/28/2023 Name: Rhonda Clayton MRN: [id number] DOB: [birth year]  Rhonda Clayton is a 78 y.o. year old female who sees Avva, Ravisankar, MD for primary care. I reached out to [name] by phone today to offer complex care management services.  [name] was given information about Complex Care Management services today including:   The Complex Care Management services include support from the care team which includes your Nurse Care Manager, Clinical Social Worker, or Pharmacist.  The Complex Care Management team is here to help remove barriers to the health concerns and goals most important to you. Complex Care Management services are voluntary, and the patient may decline or stop services at any time by request to their care team member.   Complex Care Management Consent Status: Patient did not agree to participate in complex care management services at this time.  Follow up plan:  Patient will follow up payor as she is [place] out of [place] Friday.  Encounter Outcome:  Patient Refused  [employer] Agent [employer], [facility] VBCI Assistant Direct Dial: [phone]  Fax: [phone]

## 2023-12-29 ENCOUNTER — Ambulatory Visit: Payer: Self-pay | Admitting: Internal Medicine

## 2023-12-29 ENCOUNTER — Telehealth: Payer: Self-pay | Admitting: Cardiovascular Disease

## 2023-12-29 LAB — HEPATIC FUNCTION PANEL
ALT: 14 IU/L (ref 0–32)
AST: 23 IU/L (ref 0–40)
Albumin: 4.4 g/dL (ref 3.8–4.8)
Alkaline Phosphatase: 117 IU/L (ref 49–135)
Bilirubin Total: 0.3 mg/dL (ref 0.0–1.2)
Bilirubin, Direct: 0.12 mg/dL (ref 0.00–0.40)
Total Protein: 7.5 g/dL (ref 6.0–8.5)

## 2023-12-29 LAB — LIPID PANEL
Chol/HDL Ratio: 4.2 ratio (ref 0.0–4.4)
Cholesterol, Total: 211 mg/dL — ABNORMAL HIGH (ref 100–199)
HDL: 50 mg/dL (ref >39)
LDL Chol Calc (NIH): 123 mg/dL — ABNORMAL HIGH (ref 0–99)
Triglycerides: 218 mg/dL — ABNORMAL HIGH (ref 0–149)
VLDL Cholesterol Cal: 38 mg/dL (ref 5–40)

## 2023-12-29 NOTE — Telephone Encounter (Signed)
 Patient is calling to see if using a grounding sheet and mat will be safe for her pacemaker.

## 2023-12-30 ENCOUNTER — Encounter: Payer: Self-pay | Admitting: Internal Medicine

## 2023-12-30 ENCOUNTER — Ambulatory Visit: Attending: Internal Medicine | Admitting: Internal Medicine

## 2023-12-30 ENCOUNTER — Telehealth: Payer: Self-pay | Admitting: Physician Assistant

## 2023-12-30 VITALS — BP 108/52 | HR 96 | Ht 61.0 in | Wt 171.4 lb

## 2023-12-30 DIAGNOSIS — Z794 Long term (current) use of insulin: Secondary | ICD-10-CM

## 2023-12-30 DIAGNOSIS — I152 Hypertension secondary to endocrine disorders: Secondary | ICD-10-CM | POA: Diagnosis not present

## 2023-12-30 DIAGNOSIS — E1169 Type 2 diabetes mellitus with other specified complication: Secondary | ICD-10-CM

## 2023-12-30 DIAGNOSIS — E118 Type 2 diabetes mellitus with unspecified complications: Secondary | ICD-10-CM

## 2023-12-30 DIAGNOSIS — E785 Hyperlipidemia, unspecified: Secondary | ICD-10-CM | POA: Diagnosis not present

## 2023-12-30 DIAGNOSIS — I2510 Atherosclerotic heart disease of native coronary artery without angina pectoris: Secondary | ICD-10-CM | POA: Diagnosis not present

## 2023-12-30 DIAGNOSIS — N186 End stage renal disease: Secondary | ICD-10-CM | POA: Diagnosis not present

## 2023-12-30 DIAGNOSIS — E1159 Type 2 diabetes mellitus with other circulatory complications: Secondary | ICD-10-CM

## 2023-12-30 DIAGNOSIS — I700 Atherosclerosis of aorta: Secondary | ICD-10-CM

## 2023-12-30 DIAGNOSIS — Z950 Presence of cardiac pacemaker: Secondary | ICD-10-CM | POA: Diagnosis not present

## 2023-12-30 DIAGNOSIS — I5042 Chronic combined systolic (congestive) and diastolic (congestive) heart failure: Secondary | ICD-10-CM | POA: Diagnosis not present

## 2023-12-30 DIAGNOSIS — Z952 Presence of prosthetic heart valve: Secondary | ICD-10-CM | POA: Diagnosis not present

## 2023-12-30 NOTE — Telephone Encounter (Signed)
 Will need to defer patient to [employer].

## 2023-12-30 NOTE — Telephone Encounter (Signed)
 Attempted to contact the patient. No answer- voice mail box is full.

## 2023-12-30 NOTE — Telephone Encounter (Signed)
°  [facility]   MULTIDISCIPLINARY HEART VALVE TEAM   S/p TAVR in 07/2023. She is now moving to [place]. Our team will reach out at the 1 year mark to obtain a KCCQ and NYHA class and make sure she gets a follow up echo in [place].    Lamarr Hummer PA-C  [facility]

## 2023-12-30 NOTE — Patient Instructions (Signed)
 Medication Instructions:  Your physician recommends that you continue on your current medications as directed. Please refer to the Current Medication list given to you today.  *If you need a refill on your cardiac medications before your next appointment, please call your pharmacy*  Lab Work: NONE  If you have labs (blood work) drawn today and your tests are completely normal, you will receive your results only by: MyChart Message (if you have MyChart) OR A paper copy in the mail If you have any lab test that is abnormal or we need to change your treatment, we will call you to review the results.  Testing/Procedures: NONE   Provider:   Arun K Thukkani, MD

## 2023-12-31 NOTE — Telephone Encounter (Signed)
 Spoke with pt and she is aware she is able to use the grounded sheet and aware not to let the power cord near her PPM. Pt states it lays on the floor away from her device

## 2023-12-31 NOTE — Telephone Encounter (Signed)
 Spoke to [employer] who advised patient is fine to use grounding sheet with not allowing electric cord coming within 6 inches of pacemaker. Informed Shayna, CMA who agreed to call patient to inform.

## 2024-01-11 NOTE — Progress Notes (Signed)
Unable to reach pt or leave a message mailbox is full 

## 2024-01-31 ENCOUNTER — Ambulatory Visit

## 2024-02-01 ENCOUNTER — Ambulatory Visit: Attending: Internal Medicine

## 2024-02-17 ENCOUNTER — Ambulatory Visit: Admitting: Internal Medicine

## 2024-03-14 ENCOUNTER — Encounter

## 2024-06-13 ENCOUNTER — Encounter

## 2024-07-31 ENCOUNTER — Other Ambulatory Visit (HOSPITAL_COMMUNITY)

## 2024-07-31 ENCOUNTER — Ambulatory Visit: Admitting: Physician Assistant

## 2024-09-12 ENCOUNTER — Encounter
